# Patient Record
Sex: Female | Born: 1945
Health system: Southern US, Community
[De-identification: ages and names within clinical notes are randomized; demographics above are authoritative.]

## PROBLEM LIST (undated history)

## (undated) DIAGNOSIS — E785 Hyperlipidemia, unspecified: Secondary | ICD-10-CM

## (undated) DIAGNOSIS — Z8679 Personal history of other diseases of the circulatory system: Secondary | ICD-10-CM

## (undated) DIAGNOSIS — I2510 Atherosclerotic heart disease of native coronary artery without angina pectoris: Secondary | ICD-10-CM

## (undated) DIAGNOSIS — D649 Anemia, unspecified: Secondary | ICD-10-CM

## (undated) DIAGNOSIS — T7840XA Allergy, unspecified, initial encounter: Secondary | ICD-10-CM

## (undated) DIAGNOSIS — Z789 Other specified health status: Secondary | ICD-10-CM

## (undated) DIAGNOSIS — I499 Cardiac arrhythmia, unspecified: Secondary | ICD-10-CM

## (undated) DIAGNOSIS — H269 Unspecified cataract: Secondary | ICD-10-CM

## (undated) DIAGNOSIS — Z87891 Personal history of nicotine dependence: Secondary | ICD-10-CM

## (undated) DIAGNOSIS — F329 Major depressive disorder, single episode, unspecified: Secondary | ICD-10-CM

## (undated) DIAGNOSIS — I480 Paroxysmal atrial fibrillation: Secondary | ICD-10-CM

## (undated) DIAGNOSIS — G709 Myoneural disorder, unspecified: Secondary | ICD-10-CM

## (undated) DIAGNOSIS — M47812 Spondylosis without myelopathy or radiculopathy, cervical region: Secondary | ICD-10-CM

## (undated) DIAGNOSIS — D3500 Benign neoplasm of unspecified adrenal gland: Secondary | ICD-10-CM

## (undated) DIAGNOSIS — F419 Anxiety disorder, unspecified: Secondary | ICD-10-CM

## (undated) DIAGNOSIS — F32A Depression, unspecified: Secondary | ICD-10-CM

## (undated) HISTORY — DX: Unspecified cataract: H26.9

## (undated) HISTORY — DX: Anemia, unspecified: D64.9

## (undated) HISTORY — DX: Myoneural disorder, unspecified: G70.9

## (undated) HISTORY — DX: Personal history of nicotine dependence: Z87.891

## (undated) HISTORY — DX: Other specified health status: Z78.9

## (undated) HISTORY — DX: Allergy, unspecified, initial encounter: T78.40XA

## (undated) HISTORY — DX: Anxiety disorder, unspecified: F41.9

## (undated) HISTORY — DX: Hyperlipidemia, unspecified: E78.5

## (undated) HISTORY — DX: Depression, unspecified: F32.A

## (undated) HISTORY — PX: EYE SURGERY: SHX253

## (undated) HISTORY — DX: Personal history of other diseases of the circulatory system: Z86.79

## (undated) HISTORY — DX: Atherosclerotic heart disease of native coronary artery without angina pectoris: I25.10

## (undated) HISTORY — DX: Paroxysmal atrial fibrillation: I48.0

## (undated) HISTORY — DX: Major depressive disorder, single episode, unspecified: F32.9

## (undated) HISTORY — DX: Benign neoplasm of unspecified adrenal gland: D35.00

## (undated) HISTORY — DX: Spondylosis without myelopathy or radiculopathy, cervical region: M47.812

---
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
## 1978-11-12 HISTORY — PX: ABDOMINAL HYSTERECTOMY: SHX81

## 2014-12-14 DIAGNOSIS — I5181 Takotsubo syndrome: Secondary | ICD-10-CM | POA: Diagnosis not present

## 2014-12-14 DIAGNOSIS — E782 Mixed hyperlipidemia: Secondary | ICD-10-CM | POA: Diagnosis not present

## 2014-12-14 DIAGNOSIS — I2510 Atherosclerotic heart disease of native coronary artery without angina pectoris: Secondary | ICD-10-CM | POA: Diagnosis not present

## 2015-01-25 DIAGNOSIS — R002 Palpitations: Secondary | ICD-10-CM | POA: Diagnosis not present

## 2015-01-25 DIAGNOSIS — E785 Hyperlipidemia, unspecified: Secondary | ICD-10-CM | POA: Diagnosis not present

## 2015-04-04 DIAGNOSIS — R1314 Dysphagia, pharyngoesophageal phase: Secondary | ICD-10-CM | POA: Diagnosis not present

## 2015-04-26 DIAGNOSIS — M7521 Bicipital tendinitis, right shoulder: Secondary | ICD-10-CM | POA: Diagnosis not present

## 2015-04-26 DIAGNOSIS — M9901 Segmental and somatic dysfunction of cervical region: Secondary | ICD-10-CM | POA: Diagnosis not present

## 2015-04-26 DIAGNOSIS — M47819 Spondylosis without myelopathy or radiculopathy, site unspecified: Secondary | ICD-10-CM | POA: Diagnosis not present

## 2015-04-28 DIAGNOSIS — Z124 Encounter for screening for malignant neoplasm of cervix: Secondary | ICD-10-CM | POA: Diagnosis not present

## 2015-04-28 DIAGNOSIS — Z9289 Personal history of other medical treatment: Secondary | ICD-10-CM | POA: Diagnosis not present

## 2015-05-10 DIAGNOSIS — M542 Cervicalgia: Secondary | ICD-10-CM | POA: Diagnosis not present

## 2015-05-10 DIAGNOSIS — M545 Low back pain: Secondary | ICD-10-CM | POA: Diagnosis not present

## 2015-05-20 DIAGNOSIS — M542 Cervicalgia: Secondary | ICD-10-CM | POA: Diagnosis not present

## 2015-05-20 DIAGNOSIS — M791 Myalgia: Secondary | ICD-10-CM | POA: Diagnosis not present

## 2015-08-09 DIAGNOSIS — R1314 Dysphagia, pharyngoesophageal phase: Secondary | ICD-10-CM | POA: Diagnosis not present

## 2015-08-09 DIAGNOSIS — R05 Cough: Secondary | ICD-10-CM | POA: Diagnosis not present

## 2015-08-09 DIAGNOSIS — R1310 Dysphagia, unspecified: Secondary | ICD-10-CM | POA: Diagnosis not present

## 2015-08-17 DIAGNOSIS — L309 Dermatitis, unspecified: Secondary | ICD-10-CM | POA: Diagnosis not present

## 2015-09-06 DIAGNOSIS — I2584 Coronary atherosclerosis due to calcified coronary lesion: Secondary | ICD-10-CM | POA: Diagnosis not present

## 2015-09-06 DIAGNOSIS — Z0000 Encounter for general adult medical examination without abnormal findings: Secondary | ICD-10-CM | POA: Diagnosis not present

## 2015-09-06 DIAGNOSIS — E785 Hyperlipidemia, unspecified: Secondary | ICD-10-CM | POA: Diagnosis not present

## 2015-09-06 DIAGNOSIS — Z23 Encounter for immunization: Secondary | ICD-10-CM | POA: Diagnosis not present

## 2015-09-21 DIAGNOSIS — R220 Localized swelling, mass and lump, head: Secondary | ICD-10-CM | POA: Diagnosis not present

## 2015-09-21 DIAGNOSIS — D485 Neoplasm of uncertain behavior of skin: Secondary | ICD-10-CM | POA: Diagnosis not present

## 2015-09-21 DIAGNOSIS — H0289 Other specified disorders of eyelid: Secondary | ICD-10-CM | POA: Diagnosis not present

## 2015-10-25 DIAGNOSIS — Z01812 Encounter for preprocedural laboratory examination: Secondary | ICD-10-CM | POA: Diagnosis not present

## 2015-10-25 DIAGNOSIS — I10 Essential (primary) hypertension: Secondary | ICD-10-CM | POA: Diagnosis not present

## 2015-10-25 DIAGNOSIS — R5383 Other fatigue: Secondary | ICD-10-CM | POA: Diagnosis not present

## 2015-10-25 DIAGNOSIS — E039 Hypothyroidism, unspecified: Secondary | ICD-10-CM | POA: Diagnosis not present

## 2015-10-25 DIAGNOSIS — E785 Hyperlipidemia, unspecified: Secondary | ICD-10-CM | POA: Diagnosis not present

## 2015-10-25 DIAGNOSIS — Z79899 Other long term (current) drug therapy: Secondary | ICD-10-CM | POA: Diagnosis not present

## 2015-10-26 DIAGNOSIS — Z1231 Encounter for screening mammogram for malignant neoplasm of breast: Secondary | ICD-10-CM | POA: Diagnosis not present

## 2015-10-28 DIAGNOSIS — E785 Hyperlipidemia, unspecified: Secondary | ICD-10-CM | POA: Diagnosis not present

## 2015-10-28 DIAGNOSIS — I2510 Atherosclerotic heart disease of native coronary artery without angina pectoris: Secondary | ICD-10-CM | POA: Diagnosis not present

## 2015-10-28 DIAGNOSIS — I5181 Takotsubo syndrome: Secondary | ICD-10-CM | POA: Diagnosis not present

## 2015-10-28 DIAGNOSIS — R002 Palpitations: Secondary | ICD-10-CM | POA: Diagnosis not present

## 2015-10-28 DIAGNOSIS — Z01810 Encounter for preprocedural cardiovascular examination: Secondary | ICD-10-CM | POA: Diagnosis not present

## 2015-11-08 DIAGNOSIS — M791 Myalgia: Secondary | ICD-10-CM | POA: Diagnosis not present

## 2015-11-08 DIAGNOSIS — M542 Cervicalgia: Secondary | ICD-10-CM | POA: Diagnosis not present

## 2015-11-08 DIAGNOSIS — M545 Low back pain: Secondary | ICD-10-CM | POA: Diagnosis not present

## 2015-11-18 DIAGNOSIS — M542 Cervicalgia: Secondary | ICD-10-CM | POA: Diagnosis not present

## 2015-11-18 DIAGNOSIS — M791 Myalgia: Secondary | ICD-10-CM | POA: Diagnosis not present

## 2015-12-12 DIAGNOSIS — R222 Localized swelling, mass and lump, trunk: Secondary | ICD-10-CM | POA: Diagnosis not present

## 2015-12-12 DIAGNOSIS — L905 Scar conditions and fibrosis of skin: Secondary | ICD-10-CM | POA: Diagnosis not present

## 2015-12-12 DIAGNOSIS — F411 Generalized anxiety disorder: Secondary | ICD-10-CM | POA: Diagnosis not present

## 2015-12-12 DIAGNOSIS — I2510 Atherosclerotic heart disease of native coronary artery without angina pectoris: Secondary | ICD-10-CM | POA: Diagnosis not present

## 2015-12-12 DIAGNOSIS — L908 Other atrophic disorders of skin: Secondary | ICD-10-CM | POA: Diagnosis not present

## 2015-12-12 DIAGNOSIS — R220 Localized swelling, mass and lump, head: Secondary | ICD-10-CM | POA: Diagnosis not present

## 2015-12-12 DIAGNOSIS — L720 Epidermal cyst: Secondary | ICD-10-CM | POA: Diagnosis not present

## 2015-12-13 DIAGNOSIS — S01302A Unspecified open wound of left ear, initial encounter: Secondary | ICD-10-CM | POA: Diagnosis not present

## 2015-12-13 DIAGNOSIS — S01301A Unspecified open wound of right ear, initial encounter: Secondary | ICD-10-CM | POA: Diagnosis not present

## 2015-12-16 DIAGNOSIS — S01302A Unspecified open wound of left ear, initial encounter: Secondary | ICD-10-CM | POA: Diagnosis not present

## 2015-12-16 DIAGNOSIS — S01301A Unspecified open wound of right ear, initial encounter: Secondary | ICD-10-CM | POA: Diagnosis not present

## 2015-12-28 DIAGNOSIS — S01301A Unspecified open wound of right ear, initial encounter: Secondary | ICD-10-CM | POA: Diagnosis not present

## 2015-12-28 DIAGNOSIS — S01302A Unspecified open wound of left ear, initial encounter: Secondary | ICD-10-CM | POA: Diagnosis not present

## 2016-01-06 DIAGNOSIS — S01301A Unspecified open wound of right ear, initial encounter: Secondary | ICD-10-CM | POA: Diagnosis not present

## 2016-01-06 DIAGNOSIS — S01302A Unspecified open wound of left ear, initial encounter: Secondary | ICD-10-CM | POA: Diagnosis not present

## 2016-01-18 DIAGNOSIS — I5181 Takotsubo syndrome: Secondary | ICD-10-CM | POA: Diagnosis not present

## 2016-01-18 DIAGNOSIS — E785 Hyperlipidemia, unspecified: Secondary | ICD-10-CM | POA: Diagnosis not present

## 2016-01-18 DIAGNOSIS — R002 Palpitations: Secondary | ICD-10-CM | POA: Diagnosis not present

## 2016-02-15 DIAGNOSIS — S01302A Unspecified open wound of left ear, initial encounter: Secondary | ICD-10-CM | POA: Diagnosis not present

## 2016-02-15 DIAGNOSIS — S01301A Unspecified open wound of right ear, initial encounter: Secondary | ICD-10-CM | POA: Diagnosis not present

## 2016-02-27 DIAGNOSIS — N390 Urinary tract infection, site not specified: Secondary | ICD-10-CM | POA: Diagnosis not present

## 2016-02-27 DIAGNOSIS — R971 Elevated cancer antigen 125 [CA 125]: Secondary | ICD-10-CM | POA: Diagnosis not present

## 2016-02-27 DIAGNOSIS — R1084 Generalized abdominal pain: Secondary | ICD-10-CM | POA: Diagnosis not present

## 2016-03-02 DIAGNOSIS — R102 Pelvic and perineal pain: Secondary | ICD-10-CM | POA: Diagnosis not present

## 2016-03-02 DIAGNOSIS — R140 Abdominal distension (gaseous): Secondary | ICD-10-CM | POA: Diagnosis not present

## 2016-03-08 DIAGNOSIS — R102 Pelvic and perineal pain: Secondary | ICD-10-CM | POA: Diagnosis not present

## 2016-03-12 DIAGNOSIS — R194 Change in bowel habit: Secondary | ICD-10-CM | POA: Diagnosis not present

## 2016-03-12 DIAGNOSIS — Z86010 Personal history of colonic polyps: Secondary | ICD-10-CM | POA: Diagnosis not present

## 2016-03-12 DIAGNOSIS — R1032 Left lower quadrant pain: Secondary | ICD-10-CM | POA: Diagnosis not present

## 2016-03-15 DIAGNOSIS — R109 Unspecified abdominal pain: Secondary | ICD-10-CM | POA: Diagnosis not present

## 2016-03-15 DIAGNOSIS — E278 Other specified disorders of adrenal gland: Secondary | ICD-10-CM | POA: Diagnosis not present

## 2016-03-19 DIAGNOSIS — H35363 Drusen (degenerative) of macula, bilateral: Secondary | ICD-10-CM | POA: Diagnosis not present

## 2016-03-19 DIAGNOSIS — H2513 Age-related nuclear cataract, bilateral: Secondary | ICD-10-CM | POA: Diagnosis not present

## 2016-03-27 DIAGNOSIS — M791 Myalgia: Secondary | ICD-10-CM | POA: Diagnosis not present

## 2016-03-27 DIAGNOSIS — M542 Cervicalgia: Secondary | ICD-10-CM | POA: Diagnosis not present

## 2016-03-27 DIAGNOSIS — R0782 Intercostal pain: Secondary | ICD-10-CM | POA: Diagnosis not present

## 2016-05-17 DIAGNOSIS — I25110 Atherosclerotic heart disease of native coronary artery with unstable angina pectoris: Secondary | ICD-10-CM | POA: Diagnosis not present

## 2016-05-17 DIAGNOSIS — I480 Paroxysmal atrial fibrillation: Secondary | ICD-10-CM | POA: Diagnosis not present

## 2016-05-17 DIAGNOSIS — I959 Hypotension, unspecified: Secondary | ICD-10-CM | POA: Diagnosis not present

## 2016-05-17 DIAGNOSIS — Z8673 Personal history of transient ischemic attack (TIA), and cerebral infarction without residual deficits: Secondary | ICD-10-CM | POA: Diagnosis not present

## 2016-05-17 DIAGNOSIS — I4891 Unspecified atrial fibrillation: Secondary | ICD-10-CM | POA: Diagnosis not present

## 2016-05-17 DIAGNOSIS — R9431 Abnormal electrocardiogram [ECG] [EKG]: Secondary | ICD-10-CM | POA: Diagnosis not present

## 2016-05-17 DIAGNOSIS — Z87891 Personal history of nicotine dependence: Secondary | ICD-10-CM | POA: Diagnosis not present

## 2016-05-17 DIAGNOSIS — F329 Major depressive disorder, single episode, unspecified: Secondary | ICD-10-CM | POA: Diagnosis not present

## 2016-05-17 DIAGNOSIS — E7800 Pure hypercholesterolemia, unspecified: Secondary | ICD-10-CM | POA: Diagnosis not present

## 2016-05-17 DIAGNOSIS — R001 Bradycardia, unspecified: Secondary | ICD-10-CM | POA: Diagnosis not present

## 2016-05-18 DIAGNOSIS — I361 Nonrheumatic tricuspid (valve) insufficiency: Secondary | ICD-10-CM | POA: Diagnosis not present

## 2016-05-18 DIAGNOSIS — I480 Paroxysmal atrial fibrillation: Secondary | ICD-10-CM | POA: Diagnosis not present

## 2016-05-18 DIAGNOSIS — R9431 Abnormal electrocardiogram [ECG] [EKG]: Secondary | ICD-10-CM | POA: Diagnosis not present

## 2016-05-18 DIAGNOSIS — I2510 Atherosclerotic heart disease of native coronary artery without angina pectoris: Secondary | ICD-10-CM | POA: Diagnosis not present

## 2016-05-18 DIAGNOSIS — I340 Nonrheumatic mitral (valve) insufficiency: Secondary | ICD-10-CM | POA: Diagnosis not present

## 2016-05-28 DIAGNOSIS — I482 Chronic atrial fibrillation: Secondary | ICD-10-CM | POA: Diagnosis not present

## 2016-06-11 DIAGNOSIS — I480 Paroxysmal atrial fibrillation: Secondary | ICD-10-CM | POA: Diagnosis not present

## 2016-06-11 DIAGNOSIS — I5181 Takotsubo syndrome: Secondary | ICD-10-CM | POA: Diagnosis not present

## 2016-06-18 DIAGNOSIS — M9902 Segmental and somatic dysfunction of thoracic region: Secondary | ICD-10-CM | POA: Diagnosis not present

## 2016-06-18 DIAGNOSIS — M47814 Spondylosis without myelopathy or radiculopathy, thoracic region: Secondary | ICD-10-CM | POA: Diagnosis not present

## 2016-06-22 DIAGNOSIS — M791 Myalgia: Secondary | ICD-10-CM | POA: Diagnosis not present

## 2016-06-22 DIAGNOSIS — R0782 Intercostal pain: Secondary | ICD-10-CM | POA: Diagnosis not present

## 2016-06-29 DIAGNOSIS — M542 Cervicalgia: Secondary | ICD-10-CM | POA: Diagnosis not present

## 2016-06-29 DIAGNOSIS — M791 Myalgia: Secondary | ICD-10-CM | POA: Diagnosis not present

## 2016-09-06 ENCOUNTER — Encounter: Payer: Self-pay | Admitting: Family Medicine

## 2016-09-06 ENCOUNTER — Encounter (INDEPENDENT_AMBULATORY_CARE_PROVIDER_SITE_OTHER): Payer: Self-pay | Admitting: *Deleted

## 2016-09-06 ENCOUNTER — Ambulatory Visit (INDEPENDENT_AMBULATORY_CARE_PROVIDER_SITE_OTHER): Payer: Medicare Other | Admitting: Family Medicine

## 2016-09-06 VITALS — BP 128/80 | HR 56 | Temp 98.0°F | Resp 16 | Ht 64.0 in | Wt 164.0 lb

## 2016-09-06 DIAGNOSIS — Z7689 Persons encountering health services in other specified circumstances: Secondary | ICD-10-CM | POA: Diagnosis not present

## 2016-09-06 DIAGNOSIS — N390 Urinary tract infection, site not specified: Secondary | ICD-10-CM | POA: Insufficient documentation

## 2016-09-06 DIAGNOSIS — F411 Generalized anxiety disorder: Secondary | ICD-10-CM | POA: Insufficient documentation

## 2016-09-06 DIAGNOSIS — Z86010 Personal history of colonic polyps: Secondary | ICD-10-CM

## 2016-09-06 DIAGNOSIS — I5181 Takotsubo syndrome: Secondary | ICD-10-CM | POA: Insufficient documentation

## 2016-09-06 DIAGNOSIS — E785 Hyperlipidemia, unspecified: Secondary | ICD-10-CM | POA: Insufficient documentation

## 2016-09-06 DIAGNOSIS — M791 Myalgia: Secondary | ICD-10-CM

## 2016-09-06 DIAGNOSIS — Z1211 Encounter for screening for malignant neoplasm of colon: Secondary | ICD-10-CM

## 2016-09-06 DIAGNOSIS — F418 Other specified anxiety disorders: Secondary | ICD-10-CM | POA: Insufficient documentation

## 2016-09-06 DIAGNOSIS — M7918 Myalgia, other site: Secondary | ICD-10-CM | POA: Insufficient documentation

## 2016-09-06 MED ORDER — NITROFURANTOIN MONOHYD MACRO 100 MG PO CAPS: 100.0000 mg | ORAL_CAPSULE | Freq: Two times a day (BID) | ORAL | 3 refills | Status: DC

## 2016-09-06 NOTE — Progress Notes (Signed)
Chief Complaint  Patient presents with  . Establish Care   Pleasant 70 year old woman who is here as a new patient to establish with this primary care. She recently moved to this area from [place]. Old records will be requested but are not yet available for review. She states that her health maintenance is up-to-date, however her colonoscopy was due in July 2017. She feels that she has had a recent mammogram and DEXA scan and immunization update. She states that she eats well and gets regular exercise. She states that she has known hyperlipidemia but is intolerant of all statin medications. She is on a low-cholesterol diet and takes fish oil and coenzyme Q supplements. She was previously sent to the care of a cardiologist. Patient states she's had recurring urinary tract infections. She requests a prescription for Macrodantin. She states she takes 1 pill after intercourse for prevention. She takes Wellbutrin for anxiety and depression. This works well for her. 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 She states she has a Brewing technologist level education. She's has myofascial pain syndrome with recurring pain in her cervical and thoracic region. She states this is been treated successfully with trigger point injections. She requests referral to neurology for an injection.     Patient Active Problem List   Diagnosis Date Noted  . Myofascial pain syndrome 09/06/2016  . Broken heart syndrome 09/06/2016  . HLD (hyperlipidemia) 09/06/2016  . Depression with anxiety 09/06/2016  . Recurrent UTI (urinary tract infection) 09/06/2016    Outpatient Encounter Prescriptions as of 09/06/2016  Medication Sig  . aspirin 81 MG chewable tablet Chew by mouth daily.  . B Complex Vitamins (B-COMPLEX/B-12 SL) Place under the tongue.  Marland Kitchen buPROPion (WELLBUTRIN SR) 150 MG 12 hr tablet Take 150 mg by mouth 2 (two) times daily.  Marland Kitchen co-enzyme Q-10 30 MG capsule Take 30 mg by mouth 3 (three) times daily.  Marland Kitchen docusate  sodium (COLACE) 100 MG capsule Take 100 mg by mouth daily.  Marland Kitchen estradiol (ESTRACE) 1 MG tablet Take 1 mg by mouth daily.  . Multiple Vitamins-Minerals (BIOTECT PLUS PO) Take by mouth.  . omega-3 acid ethyl esters (LOVAZA) 1 g capsule Take 2 g by mouth 2 (two) times daily.  . nitrofurantoin, macrocrystal-monohydrate, (MACROBID) 100 MG capsule Take 1 capsule (100 mg total) by mouth 2 (two) times daily.   No facility-administered encounter medications on file as of 09/06/2016.     Past Medical History:  Diagnosis Date  . Allergy    medicine  . Anemia   . Anxiety   . Cataract   . Depression   . Hyperlipidemia   . Myocardial infarction   . Neuromuscular disorder [facility])     Past Surgical History:  Procedure Laterality Date  . ABDOMINAL HYSTERECTOMY  1980   endometriosis  . TONSILLECTOMY  1954    Social History   Social History  . Marital status: Married    Spouse name: Al  . Number of children: 0  . Years of education: 69   Occupational History  . 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 Smoking status: Former Smoker    Packs/day: 1.00    Types: Cigarettes    Start date: 11/12/1965    Quit date: 09/06/2008  . Smokeless tobacco: Never Used  . Alcohol use 1.2 oz/week    2 Glasses of wine per week  . Drug use: No  . Sexual activity: Yes  Birth control/ protection: Post-menopausal   Other Topics Concern  . 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 COPD Mother   . Hearing loss Mother   . Stroke Mother   . Heart disease Mother     chf, died at 89  . Arthritis Father   . Hypertension Father   . Heart disease Father     cad, pvd, died at 12  . Heart disease Sister     heart valve  . Arthritis Sister   . Hyperlipidemia Brother   . Hypertension Brother     Review of Systems  Constitutional: Negative for  chills, fever and weight loss.  HENT: Negative for congestion and hearing loss.   Eyes: Negative for blurred vision and pain.  Respiratory: Negative for cough and shortness of breath.   Cardiovascular: Negative for chest pain and leg swelling.  Gastrointestinal: Negative for abdominal pain, constipation, diarrhea and heartburn.  Genitourinary: Negative for dysuria and frequency.  Musculoskeletal: Positive for myalgias. Negative for falls and joint pain.       Pain mid back at level of scapula  Neurological: Negative for dizziness, seizures and headaches.  Psychiatric/Behavioral: Negative for depression. The patient is not nervous/anxious and does not have insomnia.        Symptoms controlled on Wellbutrin    BP 128/80 (BP Location: Right Arm, Patient Position: Sitting, Cuff Size: Normal)   Pulse (!) 56   Temp 98 F (36.7 C) (Oral)   Resp 16   Ht 5\' 4"  (1.626 m)   Wt 164 lb 0.6 oz (74.4 kg)   LMP 07/08/1979 (Approximate)   SpO2 100%   BMI 28.16 kg/m   Physical Exam  Constitutional: She is oriented to person, place, and time. She appears well-developed and well-nourished.  Appears younger than stated age  HENT:  Head: Normocephalic and atraumatic.  Right Ear: External ear normal.  Left Ear: External ear normal.  Mouth/Throat: Oropharynx is clear and moist.  Eyes: Conjunctivae are normal. Pupils are equal, round, and reactive to light.  Neck: Normal range of motion. Neck supple. No thyromegaly present.  Cardiovascular: Normal rate, regular rhythm and normal heart sounds.   Pulmonary/Chest: Effort normal and breath sounds normal. No respiratory distress. She exhibits tenderness.  Chest wall mildly tender posteriorly near the right scapula  Abdominal: Soft. Bowel sounds are normal.  Musculoskeletal: Normal range of motion. She exhibits no edema.  Lymphadenopathy:    She has no cervical adenopathy.  Neurological: She is alert and oriented to person, place, and time.  Gait normal   Skin: Skin is warm and dry.  Psychiatric: She has a normal mood and affect. Her behavior is normal. Thought content normal.  Nursing note and vitals reviewed.  ASSESSMENT/PLAN:  1. Myofascial pain syndrome Ice, heat, anti-inflammatory medication - Ambulatory referral to Neurology  2. Broken heart syndrome Recovered. Patient states her ejection fraction is now normal  3. Hyperlipidemia, unspecified hyperlipidemia type Intolerant of statins, recent blood work will be requested  4. Depression with anxiety Controlled on Wellbutrin  5. Screen for colon cancer Colonoscopy order - Ambulatory referral to Gastroenterology  6. History of colonic polyps  - Ambulatory referral to Gastroenterology  7. Encounter to establish care with new doctor Old records are requested  8. Recurrent UTI Refill Macrodantin for when  necessary use   Patient Instructions  Need old medical records  Will refer to Dr Merlene Laughter for neurology issues  Will refer for colonoscopy  Continue to eat well and stay active  See me in a month or two      Raylene Everts, MD

## 2016-09-06 NOTE — Patient Instructions (Addendum)
Need old medical records  Will refer to Dr Merlene Laughter for neurology issues  Will refer for colonoscopy  Continue to eat well and stay active  See me in a month or two

## 2016-09-12 ENCOUNTER — Encounter: Payer: Self-pay | Admitting: Family Medicine

## 2016-09-12 DIAGNOSIS — Z789 Other specified health status: Secondary | ICD-10-CM | POA: Insufficient documentation

## 2016-09-12 DIAGNOSIS — R55 Syncope and collapse: Secondary | ICD-10-CM | POA: Insufficient documentation

## 2016-09-12 DIAGNOSIS — M47812 Spondylosis without myelopathy or radiculopathy, cervical region: Secondary | ICD-10-CM

## 2016-09-12 DIAGNOSIS — K5732 Diverticulitis of large intestine without perforation or abscess without bleeding: Secondary | ICD-10-CM | POA: Insufficient documentation

## 2016-09-12 DIAGNOSIS — I480 Paroxysmal atrial fibrillation: Secondary | ICD-10-CM | POA: Insufficient documentation

## 2016-09-12 DIAGNOSIS — I2510 Atherosclerotic heart disease of native coronary artery without angina pectoris: Secondary | ICD-10-CM | POA: Insufficient documentation

## 2016-09-12 DIAGNOSIS — G459 Transient cerebral ischemic attack, unspecified: Secondary | ICD-10-CM | POA: Insufficient documentation

## 2016-09-12 HISTORY — DX: Spondylosis without myelopathy or radiculopathy, cervical region: M47.812

## 2016-09-19 DIAGNOSIS — R252 Cramp and spasm: Secondary | ICD-10-CM | POA: Diagnosis not present

## 2016-09-19 DIAGNOSIS — F331 Major depressive disorder, recurrent, moderate: Secondary | ICD-10-CM | POA: Diagnosis not present

## 2016-09-19 DIAGNOSIS — I252 Old myocardial infarction: Secondary | ICD-10-CM | POA: Diagnosis not present

## 2016-09-19 DIAGNOSIS — E785 Hyperlipidemia, unspecified: Secondary | ICD-10-CM | POA: Diagnosis not present

## 2016-09-19 DIAGNOSIS — M545 Low back pain: Secondary | ICD-10-CM | POA: Diagnosis not present

## 2016-09-19 DIAGNOSIS — M62830 Muscle spasm of back: Secondary | ICD-10-CM | POA: Diagnosis not present

## 2016-09-28 ENCOUNTER — Encounter: Payer: Self-pay | Admitting: Family Medicine

## 2016-10-02 ENCOUNTER — Encounter: Payer: Self-pay | Admitting: Family Medicine

## 2016-10-02 ENCOUNTER — Ambulatory Visit (INDEPENDENT_AMBULATORY_CARE_PROVIDER_SITE_OTHER): Payer: Medicare Other | Admitting: Family Medicine

## 2016-10-02 DIAGNOSIS — M791 Myalgia: Secondary | ICD-10-CM | POA: Diagnosis not present

## 2016-10-02 DIAGNOSIS — I2510 Atherosclerotic heart disease of native coronary artery without angina pectoris: Secondary | ICD-10-CM

## 2016-10-02 DIAGNOSIS — Z1231 Encounter for screening mammogram for malignant neoplasm of breast: Secondary | ICD-10-CM

## 2016-10-02 DIAGNOSIS — M7918 Myalgia, other site: Secondary | ICD-10-CM

## 2016-10-02 DIAGNOSIS — Z1239 Encounter for other screening for malignant neoplasm of breast: Secondary | ICD-10-CM

## 2016-10-02 DIAGNOSIS — L989 Disorder of the skin and subcutaneous tissue, unspecified: Secondary | ICD-10-CM

## 2016-10-02 MED ORDER — ESTRADIOL 0.5 MG PO TABS: 0.5000 mg | ORAL_TABLET | Freq: Every day | ORAL | 0 refills | Status: DC

## 2016-10-02 NOTE — Progress Notes (Signed)
Chief Complaint  Patient presents with  . Follow-up    neurology  went to neurology Didn't start the prescribed Neurontin or hydrocodone Wants referral to pain management and PT  Also requests referral to cardiology to establish  Wants referral to dermatology for skin lesion  Wants refill of estrace.  I let her know I do not recommend  this medicine.  I will give her a 3 mo supply of a reduced dose in attempt to taper and discontinue.    Patient Active Problem List   Diagnosis Date Noted  . CAD (coronary artery disease) 09/12/2016  . PAF (paroxysmal atrial fibrillation) ([place]) 09/12/2016  . Syncope 09/12/2016  . DJD (degenerative joint disease) of cervical spine 09/12/2016  . Statin intolerance 09/12/2016  . TIA (transient ischemic attack) 09/12/2016  . Diverticulitis of colon 09/12/2016  . Myofascial pain syndrome 09/06/2016  . Broken heart syndrome 09/06/2016  . HLD (hyperlipidemia) 09/06/2016  . Depression with anxiety 09/06/2016  . Recurrent UTI (urinary tract infection) 09/06/2016    Outpatient Encounter Prescriptions as of 10/02/2016  Medication Sig  . aspirin 81 MG chewable tablet Chew by mouth daily.  . B Complex Vitamins (B-COMPLEX/B-12 SL) Place under the tongue.  Marland Kitchen buPROPion (WELLBUTRIN SR) 150 MG 12 hr tablet Take 150 mg by mouth 2 (two) times daily.  Marland Kitchen co-enzyme Q-10 30 MG capsule Take 30 mg by mouth 3 (three) times daily.  Marland Kitchen docusate sodium (COLACE) 100 MG capsule Take 100 mg by mouth daily.  Marland Kitchen estradiol (ESTRACE) 0.5 MG tablet Take 1 tablet (0.5 mg total) by mouth daily.  . Multiple Vitamins-Minerals (BIOTECT PLUS PO) Take by mouth.  . nitrofurantoin, macrocrystal-monohydrate, (MACROBID) 100 MG capsule Take 1 capsule (100 mg total) by mouth 2 (two) times daily.  Marland Kitchen omega-3 acid ethyl esters (LOVAZA) 1 g capsule Take 2 g by mouth 2 (two) times daily.  . [DISCONTINUED] estradiol (ESTRACE) 1 MG tablet Take 1 mg by mouth daily.   No facility-administered  encounter medications on file as of 10/02/2016.     Allergies  Allergen Reactions  . Statins Other (See Comments)    Intolerant all statins  . Sulfa Antibiotics Rash    Review of Systems  Constitutional: Negative for activity change, appetite change and fatigue.  HENT: Negative.  Negative for congestion and dental problem.   Eyes: Negative.  Negative for visual disturbance.  Respiratory: Negative.  Negative for cough and shortness of breath.   Cardiovascular: Negative.  Negative for chest pain, palpitations and leg swelling.  Gastrointestinal: Negative for constipation and diarrhea.  Genitourinary: Negative for urgency and vaginal bleeding.  Musculoskeletal: Positive for myalgias.  Psychiatric/Behavioral: Negative for dysphoric mood and sleep disturbance.    LMP 07/08/1979 (Approximate)   Physical Exam  Constitutional: She is oriented to person, place, and time. She appears well-developed and well-nourished. No distress.  Refuses vital signs, weight or exam.  Wants "discussion"  Neurological: She is alert and oriented to person, place, and time.  Psychiatric: She has a normal mood and affect. Her behavior is normal. Thought content normal.    ASSESSMENT/PLAN:  1. Myofascial pain syndrome - Ambulatory referral to Pain Clinic - Ambulatory referral to Physical Therapy 2. Coronary artery disease involving native heart without angina pectoris, unspecified vessel or lesion type - Ambulatory referral to Cardiology 3. Skin lesion of chest wall - Ambulatory referral to Dermatology 4. Screening for breast cancer - MM Digital Screening; Future   Patient Instructions  Try to cut the estrace in  half  Referrals are placed  See me in 3 months  Call sooner for problems     Raylene Everts, MD

## 2016-10-02 NOTE — Patient Instructions (Addendum)
Try to cut the estrace in half  Referrals are placed  See me in 3 months  Call sooner for problems

## 2016-10-10 DIAGNOSIS — I781 Nevus, non-neoplastic: Secondary | ICD-10-CM | POA: Diagnosis not present

## 2016-10-10 DIAGNOSIS — L720 Epidermal cyst: Secondary | ICD-10-CM | POA: Diagnosis not present

## 2016-10-10 DIAGNOSIS — L820 Inflamed seborrheic keratosis: Secondary | ICD-10-CM | POA: Diagnosis not present

## 2016-10-12 ENCOUNTER — Telehealth (HOSPITAL_COMMUNITY): Payer: Self-pay | Admitting: Specialist

## 2016-10-12 NOTE — Telephone Encounter (Signed)
Pt called states she is much better and going to the gym she cx this apptment and doesn't need OT at this time

## 2016-10-16 ENCOUNTER — Ambulatory Visit (HOSPITAL_COMMUNITY): Payer: Medicare Other

## 2016-10-30 ENCOUNTER — Ambulatory Visit: Payer: Medicare Other | Admitting: Family Medicine

## 2016-11-02 ENCOUNTER — Telehealth: Payer: Self-pay | Admitting: Cardiology

## 2016-11-02 NOTE — Telephone Encounter (Signed)
Since I have not yet evaluated her or reviewed her history, she still needs to keep in contact with her PCP for recommendations. If symptoms are infrequent, she might be able to use an agent such as Lopressor or short acting Cardizem for palpitations, but again I would have her discuss this and have it ordered by her PCP since I have not seen her yet. If symptoms escalate, being evaluated in the ER would also be appropriate.

## 2016-11-02 NOTE — Telephone Encounter (Signed)
I spoke with patient and gave her Dr Myles Gip advice,she agreed.

## 2016-11-02 NOTE — Telephone Encounter (Signed)
Pt lvm stating she had an unsteady rhythm last night that went all the way up into the 130's --please give her a call @ [phone]

## 2016-11-02 NOTE — Telephone Encounter (Signed)
Had one episode of Afib with RVR this summer in [place],was cardioverted x 2 but was not successful,converted on own later.Declined any rate control meds.Yesterday had 12 hr episode of HR up to 130.Resolved and feels back to normal now. She has a new patient apt with you in [place] question is what should she do if it occurs again before she sees you. I told her she may need to go to the ED

## 2016-11-08 ENCOUNTER — Encounter: Payer: Self-pay | Admitting: Family Medicine

## 2016-11-08 ENCOUNTER — Ambulatory Visit (INDEPENDENT_AMBULATORY_CARE_PROVIDER_SITE_OTHER): Payer: Medicare Other | Admitting: Family Medicine

## 2016-11-08 VITALS — BP 118/70 | HR 60 | Temp 98.0°F | Resp 16 | Ht 64.0 in

## 2016-11-08 DIAGNOSIS — I480 Paroxysmal atrial fibrillation: Secondary | ICD-10-CM

## 2016-11-08 DIAGNOSIS — M79672 Pain in left foot: Secondary | ICD-10-CM

## 2016-11-08 DIAGNOSIS — I2510 Atherosclerotic heart disease of native coronary artery without angina pectoris: Secondary | ICD-10-CM | POA: Diagnosis not present

## 2016-11-08 MED ORDER — ESTRADIOL 0.5 MG PO TABS: 0.5000 mg | ORAL_TABLET | Freq: Every day | ORAL | 0 refills | Status: DC

## 2016-11-08 NOTE — Patient Instructions (Addendum)
I will refer to podiatry for the foot pain  See cardiology as scheduled  Call for questions or problems  Check on mammogram order

## 2016-11-08 NOTE — Progress Notes (Signed)
Chief Complaint  Patient presents with  . Follow-up    3 month   Routine follow up Is having recurrent runs of A Fib and we discussed that I recomend she see cardiology, and follow their recommendations to go on an anticoagulant.I had already placed to cardiology referral and she is going next week. She already has known coronary artery disease, she had a spell of takotsubo cardiomyopathy, and recovered. Now with recurring atrial fibrillation. She has no significant hyperlipidemia and is unwilling or unable to take a statin medication. I have concern regarding her risk of cardiovascular disease as well as her risk of stroke given recurring atrial fibrillation. In addition we discussed that she has left foot pain. It hurts to the bottom of her foot underneath the outside anklebone. She looked it up on the Internet and thinks she has tarsal tunnel syndrome. She requests referral to podiatry. She states that the myofascial pain in her thoracic region is improved.  Patient Active Problem List   Diagnosis Date Noted  . CAD (coronary artery disease) 09/12/2016  . PAF (paroxysmal atrial fibrillation) ([place]) 09/12/2016  . Syncope 09/12/2016  . DJD (degenerative joint disease) of cervical spine 09/12/2016  . Statin intolerance 09/12/2016  . TIA (transient ischemic attack) 09/12/2016  . Diverticulitis of colon 09/12/2016  . Myofascial pain syndrome 09/06/2016  . Takotsubo syndrome 09/06/2016  . HLD (hyperlipidemia) 09/06/2016  . Depression with anxiety 09/06/2016  . Recurrent UTI (urinary tract infection) 09/06/2016    Outpatient Encounter Prescriptions as of 11/08/2016  Medication Sig  . aspirin 81 MG chewable tablet Chew by mouth daily.  . B Complex Vitamins (B-COMPLEX/B-12 SL) Place under the tongue.  . Biotin 10000 MCG TABS Take by mouth.  Marland Kitchen buPROPion (WELLBUTRIN SR) 150 MG 12 hr tablet Take 150 mg by mouth 2 (two) times daily.  Marland Kitchen co-enzyme Q-10 30 MG capsule Take 30 mg by mouth 3  (three) times daily.  Marland Kitchen docusate sodium (COLACE) 100 MG capsule Take 100 mg by mouth daily.  Marland Kitchen estradiol (ESTRACE) 0.5 MG tablet Take 1 tablet (0.5 mg total) by mouth daily.  Marland Kitchen omega-3 acid ethyl esters (LOVAZA) 1 g capsule Take 2 g by mouth 2 (two) times daily.  . Multiple Vitamins-Minerals (BIOTECT PLUS PO) Take by mouth.  . nitrofurantoin, macrocrystal-monohydrate, (MACROBID) 100 MG capsule Take 1 capsule (100 mg total) by mouth 2 (two) times daily. (Patient not taking: Reported on 11/08/2016)   No facility-administered encounter medications on file as of 11/08/2016.     Allergies  Allergen Reactions  . Statins Other (See Comments)    Intolerant all statins  . Sulfa Antibiotics Rash    Review of Systems  Constitutional: Negative for activity change, appetite change and fatigue.  HENT: Negative.  Negative for congestion and dental problem.   Eyes: Negative.  Negative for visual disturbance.  Respiratory: Negative.  Negative for cough and shortness of breath.   Cardiovascular: Positive for palpitations. Negative for chest pain and leg swelling.  Gastrointestinal: Negative for constipation and diarrhea.  Genitourinary: Negative for urgency and vaginal bleeding.  Musculoskeletal: Positive for gait problem and myalgias.  Psychiatric/Behavioral: Negative for dysphoric mood and sleep disturbance.   BP 118/70 (BP Location: Right Arm, Patient Position: Sitting, Cuff Size: Normal)   Pulse 60   Temp 98 F (36.7 C) (Oral)   Resp 16   Ht 5\' 4"  (1.626 m)   SpO2 97%   Physical Exam  Constitutional: She is oriented to person, place, and time.  She appears well-developed and well-nourished. No distress.  Refuses vital signs, weight or exam.  Wants "discussion"  Cardiovascular: Normal rate, regular rhythm and normal heart sounds.   Pulmonary/Chest: Effort normal and breath sounds normal.  Neurological: She is alert and oriented to person, place, and time.  Psychiatric: She has a normal mood  and affect. Her behavior is normal. Thought content normal.    ASSESSMENT/PLAN:  1. Foot pain, left  - Ambulatory referral to Podiatry  2. PAF (paroxysmal atrial fibrillation) [facility])  Referral to cardiology  Patient Instructions  I will refer to podiatry for the foot pain  See cardiology as scheduled  Call for questions or problems  Check on mammogram order   Raylene Everts, MD

## 2016-11-16 ENCOUNTER — Encounter: Payer: Self-pay | Admitting: Cardiology

## 2016-11-16 ENCOUNTER — Ambulatory Visit: Payer: Medicare Other | Admitting: Cardiology

## 2016-11-16 ENCOUNTER — Ambulatory Visit (INDEPENDENT_AMBULATORY_CARE_PROVIDER_SITE_OTHER): Payer: Medicare Other | Admitting: Cardiology

## 2016-11-16 VITALS — BP 104/60 | HR 63 | Ht 64.0 in | Wt 164.0 lb

## 2016-11-16 DIAGNOSIS — Z789 Other specified health status: Secondary | ICD-10-CM | POA: Diagnosis not present

## 2016-11-16 DIAGNOSIS — Z8679 Personal history of other diseases of the circulatory system: Secondary | ICD-10-CM | POA: Diagnosis not present

## 2016-11-16 DIAGNOSIS — I25810 Atherosclerosis of coronary artery bypass graft(s) without angina pectoris: Secondary | ICD-10-CM

## 2016-11-16 DIAGNOSIS — E782 Mixed hyperlipidemia: Secondary | ICD-10-CM

## 2016-11-16 DIAGNOSIS — I480 Paroxysmal atrial fibrillation: Secondary | ICD-10-CM

## 2016-11-16 NOTE — Patient Instructions (Signed)
Your physician recommends that you schedule a follow-up appointment in: 3 months Dr Domenic Polite    Your physician has requested that you have an echocardiogram. Echocardiography is a painless test that uses sound waves to create images of your heart. It provides your doctor with information about the size and shape of your heart and how well your heart's chambers and valves are working. This procedure takes approximately one hour. There are no restrictions for this procedure.    Please make an apt with [facility], Edrick Oh RN    Continue aspirin until then     Thank you for choosing [facility] !

## 2016-11-16 NOTE — Progress Notes (Signed)
Cardiology Office Note  Date: 11/16/2016   ID: Heather [name], DOB 29-Mar-1946, MRN [id number]  PCP: Raylene Everts, MD  Consulting Cardiologist: Rozann Lesches, MD   Chief Complaint  Patient presents with  . Coronary Artery Disease  . PAF    History of Present Illness: Heather [name] is a 71 y.o. female referred for cardiology consultation by Dr. Meda Coffee. I reviewed the available outside records regarding her previous cardiac workup and updated the chart. History includes nonobstructive CAD involving the mid LAD documented by cardiac catheterization in 2014. She reportedly had an episode of Tako-tsubo cardiomyopathy in October 2015, and had subsequent normalization of LVEF. She recalls having a cardiac catheterization in 2015 that showed stable coronary anatomy. She also has a history of paroxysmal atrial fibrillation documented in July 2017. At that point she declined anticoagulation and apparently underwent an unsuccessful attempt at cardioversion, although with subsequent spontaneous conversion to sinus rhythm after 12 hours.  She moved to this area with her husband. States that she has had at least 2 episodes of limited atrial fibrillation since then, based on finding her heart rate in the 130s to 140s on her Fitbit and feeling palpitations. She has not had any angina symptoms. She joined the [employer] to exercise.  CHADSVASC score is 3 based on available information. Annual risk of stroke is approximately 3.4% on aspirin versus 1.1% on agent such as Eliquis in approximately 1.5% on Coumadin. She states that she does not have prescription drug coverage and would prefer to take Coumadin, her husband is also on Coumadin.  I reviewed her ECG today which shows sinus bradycardia with anteroseptal Q waves, nonspecific T-wave changes, QT 436 ms.  Today we had a detailed discussion about paroxysmal atrial fibrillation, use of anticoagulation for stroke prophylaxis, also various methods for  maintaining sinus rhythm including antiarrhythmics and potentially even ablation. For the time being we have elected to adopt a strategy of observation on anticoagulation to see how frequently she experiences breakthrough events. He has a history of bradycardia and possibly conduction system disease, was not able to tolerate beta blocker in the past due to marked bradycardia. This may limit treatment options, she may even require a pacemaker at some point in the future. She voiced comfort with this plan.  Past Medical History:  Diagnosis Date  . Adrenal adenoma   . Allergy   . Anemia   . Anxiety   . CAD (coronary artery disease)    50-60% mid LAD at cardiac catheterization 2014 [facility])  . Cataract   . Depression   . DJD (degenerative joint disease) of cervical spine 09/12/2016  . History of cardiomyopathy   . History of tobacco use   . Hyperlipidemia   . Neuromuscular disorder ([place])   . PAF (paroxysmal atrial fibrillation) [facility])    Diagnosis July 2017 - spontaneously resolved  . Statin intolerance     Past Surgical History:  Procedure Laterality Date  . ABDOMINAL HYSTERECTOMY  1980   endometriosis  . TONSILLECTOMY  1954    Current Outpatient Prescriptions  Medication Sig Dispense Refill  . aspirin 81 MG chewable tablet Chew by mouth daily.    . B Complex Vitamins (B-COMPLEX/B-12 SL) Place under the tongue.    . Biotin 10000 MCG TABS Take by mouth.    Marland Kitchen buPROPion (WELLBUTRIN SR) 150 MG 12 hr tablet Take 150 mg by mouth 2 (two) times daily.    Marland Kitchen co-enzyme Q-10 30 MG capsule Take 30 mg by mouth 3 (  three) times daily.    Marland Kitchen docusate sodium (COLACE) 100 MG capsule Take 100 mg by mouth daily.    Marland Kitchen estradiol (ESTRACE) 0.5 MG tablet Take 1 tablet (0.5 mg total) by mouth daily. 90 tablet 0  . omega-3 acid ethyl esters (LOVAZA) 1 g capsule Take 2 g by mouth 2 (two) times daily.     No current facility-administered medications for this visit.    Allergies:  Statins and Sulfa  antibiotics   Social History: The patient  reports that she quit smoking about 8 years ago. Her smoking use included Cigarettes. She started smoking about 51 years ago. She smoked 1.00 pack per day. She has never used smokeless tobacco. She reports that she drinks about 1.2 oz of alcohol per week . She reports that she does not use drugs.   Family History: The patient's family history includes Arthritis in her father and sister; COPD in her mother; Hearing loss in her mother; Heart disease in her father, mother, and sister; Hyperlipidemia in her brother; Hypertension in her brother and father; Stroke in her mother.   ROS:  Please see the history of present illness. Otherwise, complete review of systems is positive for none.  All other systems are reviewed and negative.   Physical Exam: VS:  BP 104/60 (BP Location: Right Arm)   Pulse 63   Ht 5\' 4"  (1.626 m)   Wt 164 lb (74.4 kg)   LMP 07/08/1979 (Approximate)   SpO2 99%   BMI 28.15 kg/m , BMI Body mass index is 28.15 kg/m.  Wt Readings from Last 3 Encounters:  11/16/16 164 lb (74.4 kg)  09/06/16 164 lb 0.6 oz (74.4 kg)    General: Patient appears comfortable at rest. Appears younger than stated age. HEENT: Conjunctiva and lids normal, oropharynx clear. Neck: Supple, no elevated JVP or carotid bruits, no thyromegaly. Lungs: Clear to auscultation, nonlabored breathing at rest. Cardiac: Regular rate and rhythm, no S3 or significant systolic murmur, no pericardial rub. Abdomen: Soft, nontender, bowel sounds present, no guarding or rebound. Extremities: No pitting edema, distal pulses 2+. Skin: Warm and dry. Musculoskeletal: No kyphosis. Neuropsychiatric: Alert and oriented x3, affect grossly appropriate.  ECG: I personally reviewed the tracing from 09/10/2016 which showed sinus bradycardia with septal Q waves.  Recent Labwork:  October 2017: BUN 17, creatinine 0.8, cholesterol 385, triglycerides 78, HDL 81, LDL 288, AST 14, ALT  15  Other Studies Reviewed Today:  Cardiac catheterization 03/06/2013 [facility]): Left main with mild luminal irregularities, 50-60% mid LAD stenosis spanning the origin of moderate caliber second diagonal branch which is larger in caliber than the distal LAD, left circumflex with mild luminal irregularities, RCA with mild luminal irregularities.  Echocardiogram 12/14/2014 [facility]): Normal LV wall thickness without regional wall motion abnormalities, LVEF 0000000, mild diastolic dysfunction, trace mitral regurgitation, normal left atrial chamber size, normal aortic valve, normal right ventricular contraction, trace tricuspid regurgitation with PA S/P 15-20 mmHg, normal aortic root size, no pericardial effusion.  Assessment and Plan:  1. Paroxysmal atrial fibrillation with CHADSVASC score 3. She has had brief limited events since moving to this area from [place] based on palpitations and elevated heart rate on her Fitbit. As outlined above we have discussed anticoagulation for stroke prophylaxis, and she would like to initiate Coumadin, does not have prescription drug coverage for DOAC. She will be enrolled in our anticoagulation clinic. We will continue with observation to see how frequently she has breakthrough events. Will consider referral to  EP for discussion of antiarrhythmic therapy and other options for treatment depending on symptom frequency. Echocardiogram will also be obtained to follow-up on cardiac structure and function.  2. Moderate nonobstructive CAD involving the LAD based on previous cardiac catheterization in 2014 and reportedly 2015 (I do not have this report). She is not reporting any angina symptoms at this time. For now she takes an aspirin daily. This will be stopped when she initiates Coumadin.  3. History of hyperlipidemia and statin intolerance. May be a good candidate for referral to the lipid clinic and discussion of PCSK9  inhibitor.  4. Reported history of Tako-tsubo cardiomyopathy in 2015 with subsequent normalization of LVEF.   Current medicines were reviewed with the patient today.   Orders Placed This Encounter  Procedures  . EKG 12-Lead  . ECHOCARDIOGRAM COMPLETE    Disposition: Follow-up in 3 months.  Signed, Satira Sark, MD, [facility] 11/16/2016 2:10 PM    [place] [facility] [address] [place], [place] [address] Phone: [phone]; Fax: [phone]

## 2016-11-19 ENCOUNTER — Ambulatory Visit: Payer: Medicare Other | Admitting: Cardiovascular Disease

## 2016-11-21 ENCOUNTER — Ambulatory Visit (INDEPENDENT_AMBULATORY_CARE_PROVIDER_SITE_OTHER): Payer: Medicare Other | Admitting: *Deleted

## 2016-11-21 ENCOUNTER — Ambulatory Visit (HOSPITAL_COMMUNITY)
Admission: RE | Admit: 2016-11-21 | Discharge: 2016-11-21 | Disposition: A | Discharge status: Home / Self Care | Payer: Medicare Other | Source: Ambulatory Visit | Attending: Cardiology | Admitting: Cardiology

## 2016-11-21 DIAGNOSIS — E785 Hyperlipidemia, unspecified: Secondary | ICD-10-CM | POA: Insufficient documentation

## 2016-11-21 DIAGNOSIS — I480 Paroxysmal atrial fibrillation: Secondary | ICD-10-CM

## 2016-11-21 DIAGNOSIS — Z8673 Personal history of transient ischemic attack (TIA), and cerebral infarction without residual deficits: Secondary | ICD-10-CM | POA: Insufficient documentation

## 2016-11-21 DIAGNOSIS — I2510 Atherosclerotic heart disease of native coronary artery without angina pectoris: Secondary | ICD-10-CM | POA: Insufficient documentation

## 2016-11-21 DIAGNOSIS — I361 Nonrheumatic tricuspid (valve) insufficiency: Secondary | ICD-10-CM | POA: Insufficient documentation

## 2016-11-21 DIAGNOSIS — I25810 Atherosclerosis of coronary artery bypass graft(s) without angina pectoris: Secondary | ICD-10-CM

## 2016-11-21 DIAGNOSIS — Z5181 Encounter for therapeutic drug level monitoring: Secondary | ICD-10-CM | POA: Diagnosis not present

## 2016-11-21 DIAGNOSIS — I059 Rheumatic mitral valve disease, unspecified: Secondary | ICD-10-CM | POA: Insufficient documentation

## 2016-11-21 DIAGNOSIS — Z87891 Personal history of nicotine dependence: Secondary | ICD-10-CM | POA: Insufficient documentation

## 2016-11-21 LAB — POCT INR: INR: 0.9

## 2016-11-21 MED ORDER — WARFARIN SODIUM 5 MG PO TABS: 5.0000 mg | ORAL_TABLET | Freq: Every day | ORAL | 3 refills | Status: DC

## 2016-11-21 NOTE — Progress Notes (Signed)
*  PRELIMINARY RESULTS* Echocardiogram 2D Echocardiogram has been performed.  Leavy Cella 11/21/2016, 3:44 PM

## 2016-11-22 DIAGNOSIS — Z23 Encounter for immunization: Secondary | ICD-10-CM | POA: Diagnosis not present

## 2016-11-28 ENCOUNTER — Ambulatory Visit: Payer: PRIVATE HEALTH INSURANCE | Admitting: Podiatry

## 2016-12-03 ENCOUNTER — Ambulatory Visit (INDEPENDENT_AMBULATORY_CARE_PROVIDER_SITE_OTHER): Payer: Medicare Other | Admitting: *Deleted

## 2016-12-03 ENCOUNTER — Other Ambulatory Visit (INDEPENDENT_AMBULATORY_CARE_PROVIDER_SITE_OTHER): Payer: Self-pay | Admitting: *Deleted

## 2016-12-03 DIAGNOSIS — Z86010 Personal history of colonic polyps: Secondary | ICD-10-CM

## 2016-12-03 DIAGNOSIS — Z5181 Encounter for therapeutic drug level monitoring: Secondary | ICD-10-CM | POA: Diagnosis not present

## 2016-12-03 DIAGNOSIS — Z800 Family history of malignant neoplasm of digestive organs: Secondary | ICD-10-CM

## 2016-12-03 DIAGNOSIS — I25810 Atherosclerosis of coronary artery bypass graft(s) without angina pectoris: Secondary | ICD-10-CM | POA: Diagnosis not present

## 2016-12-03 DIAGNOSIS — I480 Paroxysmal atrial fibrillation: Secondary | ICD-10-CM

## 2016-12-03 LAB — POCT INR: INR: 1.3

## 2016-12-06 DIAGNOSIS — M2040 Other hammer toe(s) (acquired), unspecified foot: Secondary | ICD-10-CM | POA: Diagnosis not present

## 2016-12-06 DIAGNOSIS — M79673 Pain in unspecified foot: Secondary | ICD-10-CM | POA: Diagnosis not present

## 2016-12-06 DIAGNOSIS — G5750 Tarsal tunnel syndrome, unspecified lower limb: Secondary | ICD-10-CM | POA: Diagnosis not present

## 2016-12-10 ENCOUNTER — Ambulatory Visit (INDEPENDENT_AMBULATORY_CARE_PROVIDER_SITE_OTHER): Payer: Medicare Other | Admitting: *Deleted

## 2016-12-10 DIAGNOSIS — Z5181 Encounter for therapeutic drug level monitoring: Secondary | ICD-10-CM

## 2016-12-10 DIAGNOSIS — I480 Paroxysmal atrial fibrillation: Secondary | ICD-10-CM | POA: Diagnosis not present

## 2016-12-10 DIAGNOSIS — I25810 Atherosclerosis of coronary artery bypass graft(s) without angina pectoris: Secondary | ICD-10-CM | POA: Diagnosis not present

## 2016-12-10 LAB — POCT INR: INR: 1.5

## 2016-12-10 MED ORDER — WARFARIN SODIUM 7.5 MG PO TABS: 7.5000 mg | ORAL_TABLET | Freq: Every day | ORAL | 4 refills | Status: DC

## 2016-12-14 ENCOUNTER — Other Ambulatory Visit: Payer: Self-pay

## 2016-12-14 DIAGNOSIS — G454 Transient global amnesia: Secondary | ICD-10-CM

## 2016-12-14 NOTE — Progress Notes (Unsigned)
You have been referred to neurology. Someone from their office will contact you soon  Thanks for choosing [facility]!!!  ]

## 2016-12-17 ENCOUNTER — Ambulatory Visit (INDEPENDENT_AMBULATORY_CARE_PROVIDER_SITE_OTHER): Payer: Medicare Other | Admitting: *Deleted

## 2016-12-17 DIAGNOSIS — Z5181 Encounter for therapeutic drug level monitoring: Secondary | ICD-10-CM

## 2016-12-17 DIAGNOSIS — I25810 Atherosclerosis of coronary artery bypass graft(s) without angina pectoris: Secondary | ICD-10-CM

## 2016-12-17 DIAGNOSIS — I480 Paroxysmal atrial fibrillation: Secondary | ICD-10-CM

## 2016-12-17 LAB — POCT INR: INR: 1.3

## 2016-12-17 MED ORDER — WARFARIN SODIUM 5 MG PO TABS: ORAL_TABLET | ORAL | 3 refills | Status: DC

## 2016-12-21 ENCOUNTER — Encounter (INDEPENDENT_AMBULATORY_CARE_PROVIDER_SITE_OTHER): Payer: Self-pay

## 2016-12-24 ENCOUNTER — Ambulatory Visit (INDEPENDENT_AMBULATORY_CARE_PROVIDER_SITE_OTHER): Payer: Medicare Other | Admitting: *Deleted

## 2016-12-24 DIAGNOSIS — I480 Paroxysmal atrial fibrillation: Secondary | ICD-10-CM | POA: Diagnosis not present

## 2016-12-24 DIAGNOSIS — I25810 Atherosclerosis of coronary artery bypass graft(s) without angina pectoris: Secondary | ICD-10-CM | POA: Diagnosis not present

## 2016-12-24 DIAGNOSIS — Z5181 Encounter for therapeutic drug level monitoring: Secondary | ICD-10-CM

## 2016-12-24 LAB — POCT INR: INR: 3.1

## 2016-12-31 ENCOUNTER — Ambulatory Visit (INDEPENDENT_AMBULATORY_CARE_PROVIDER_SITE_OTHER): Payer: Medicare Other | Admitting: *Deleted

## 2016-12-31 DIAGNOSIS — I480 Paroxysmal atrial fibrillation: Secondary | ICD-10-CM | POA: Diagnosis not present

## 2016-12-31 DIAGNOSIS — Z5181 Encounter for therapeutic drug level monitoring: Secondary | ICD-10-CM

## 2016-12-31 DIAGNOSIS — I25810 Atherosclerosis of coronary artery bypass graft(s) without angina pectoris: Secondary | ICD-10-CM

## 2016-12-31 LAB — POCT INR: INR: 1.9

## 2017-01-02 ENCOUNTER — Ambulatory Visit: Payer: Medicare Other | Admitting: Family Medicine

## 2017-01-11 ENCOUNTER — Telehealth (INDEPENDENT_AMBULATORY_CARE_PROVIDER_SITE_OTHER): Payer: Self-pay | Admitting: *Deleted

## 2017-01-11 ENCOUNTER — Encounter (INDEPENDENT_AMBULATORY_CARE_PROVIDER_SITE_OTHER): Payer: Self-pay | Admitting: *Deleted

## 2017-01-11 NOTE — Telephone Encounter (Signed)
Patient needs trilyte 

## 2017-01-11 NOTE — Telephone Encounter (Signed)
Patient is scheduled for colonoscopy 03/06/17 and needs to stop Warfarin 5 days & ASA 2 days prior -- please advise if ok

## 2017-01-14 ENCOUNTER — Ambulatory Visit (INDEPENDENT_AMBULATORY_CARE_PROVIDER_SITE_OTHER): Payer: Medicare Other | Admitting: *Deleted

## 2017-01-14 DIAGNOSIS — I25810 Atherosclerosis of coronary artery bypass graft(s) without angina pectoris: Secondary | ICD-10-CM | POA: Diagnosis not present

## 2017-01-14 DIAGNOSIS — Z5181 Encounter for therapeutic drug level monitoring: Secondary | ICD-10-CM

## 2017-01-14 DIAGNOSIS — I480 Paroxysmal atrial fibrillation: Secondary | ICD-10-CM

## 2017-01-14 LAB — POCT INR: INR: 3

## 2017-01-14 MED ORDER — PEG 3350-KCL-NA BICARB-NACL 420 G PO SOLR: 4000.0000 mL | Freq: Once | ORAL | 0 refills | Status: AC

## 2017-01-15 NOTE — Telephone Encounter (Signed)
Do you think think pt needs Lovenox bridge.  Dr Meda Coffee mentions TIA but I don't see any documentation of that.  Please advise. Thanks.

## 2017-01-16 NOTE — Telephone Encounter (Signed)
Does not look like there is a strong indication for Lovenox bridge.

## 2017-01-16 NOTE — Telephone Encounter (Signed)
Ok to hold coumadin 5 days before procedure and ASA 2 days before procedure.  Does not need Lovenox.

## 2017-01-16 NOTE — Telephone Encounter (Signed)
Patient aware.

## 2017-01-24 ENCOUNTER — Ambulatory Visit: Payer: Medicare Other | Admitting: Neurology

## 2017-01-28 ENCOUNTER — Ambulatory Visit (INDEPENDENT_AMBULATORY_CARE_PROVIDER_SITE_OTHER): Payer: Medicare Other | Admitting: *Deleted

## 2017-01-28 DIAGNOSIS — I480 Paroxysmal atrial fibrillation: Secondary | ICD-10-CM

## 2017-01-28 DIAGNOSIS — Z5181 Encounter for therapeutic drug level monitoring: Secondary | ICD-10-CM | POA: Diagnosis not present

## 2017-01-28 DIAGNOSIS — I25810 Atherosclerosis of coronary artery bypass graft(s) without angina pectoris: Secondary | ICD-10-CM

## 2017-01-28 LAB — POCT INR: INR: 1.9

## 2017-01-29 ENCOUNTER — Telehealth (INDEPENDENT_AMBULATORY_CARE_PROVIDER_SITE_OTHER): Payer: Self-pay | Admitting: *Deleted

## 2017-01-29 NOTE — Telephone Encounter (Signed)
Referring MD/PCP: nelson   Procedure: tcs  Reason/Indication:  fam hx colon ca, hx polyps  Has patient had this procedure before?  Yes, 5 yrs ago -- scanned  If so, when, by whom and where?    Is there a family history of colon cancer?  Yes, niece  Who?  What age when diagnosed?    Is patient diabetic?   no      Does patient have prosthetic heart valve or mechanical valve?  no  Do you have a pacemaker?  no  Has patient ever had endocarditis? no  Has patient had joint replacement within last 12 months?  no  Does patient tend to be constipated or take laxatives? no  Does patient have a history of alcohol/drug use?  no  Is patient on Coumadin, Plavix and/or Aspirin? no  Medications: see epic  Allergies: sulfur drugs  Medication Adjustment per Dr Laural Golden: asa 2 days, warfain 5 days  Procedure date & time:

## 2017-02-01 ENCOUNTER — Ambulatory Visit (HOSPITAL_COMMUNITY)
Admission: RE | Admit: 2017-02-01 | Discharge: 2017-02-01 | Disposition: A | Discharge status: Home / Self Care | Payer: Medicare Other | Source: Ambulatory Visit | Attending: Family Medicine | Admitting: Family Medicine

## 2017-02-01 DIAGNOSIS — Z1231 Encounter for screening mammogram for malignant neoplasm of breast: Secondary | ICD-10-CM | POA: Diagnosis not present

## 2017-02-01 DIAGNOSIS — Z1239 Encounter for other screening for malignant neoplasm of breast: Secondary | ICD-10-CM

## 2017-02-08 ENCOUNTER — Encounter: Payer: Self-pay | Admitting: Neurology

## 2017-02-08 ENCOUNTER — Ambulatory Visit (INDEPENDENT_AMBULATORY_CARE_PROVIDER_SITE_OTHER): Payer: Medicare Other | Admitting: Neurology

## 2017-02-08 VITALS — BP 94/50 | HR 58 | Resp 16 | Ht 64.0 in | Wt 161.0 lb

## 2017-02-08 DIAGNOSIS — M549 Dorsalgia, unspecified: Secondary | ICD-10-CM | POA: Insufficient documentation

## 2017-02-08 DIAGNOSIS — I25810 Atherosclerosis of coronary artery bypass graft(s) without angina pectoris: Secondary | ICD-10-CM

## 2017-02-08 DIAGNOSIS — M7918 Myalgia, other site: Secondary | ICD-10-CM

## 2017-02-08 DIAGNOSIS — G8929 Other chronic pain: Secondary | ICD-10-CM | POA: Diagnosis not present

## 2017-02-08 DIAGNOSIS — M546 Pain in thoracic spine: Secondary | ICD-10-CM | POA: Insufficient documentation

## 2017-02-08 DIAGNOSIS — M791 Myalgia: Secondary | ICD-10-CM

## 2017-02-08 HISTORY — DX: Dorsalgia, unspecified: M54.9

## 2017-02-08 MED ORDER — ETODOLAC 400 MG PO TABS: 400.0000 mg | ORAL_TABLET | Freq: Two times a day (BID) | ORAL | 5 refills | Status: DC | PRN

## 2017-02-08 MED ORDER — OXYCODONE-ACETAMINOPHEN 5-325 MG PO TABS: 1.0000 | ORAL_TABLET | Freq: Every day | ORAL | 0 refills | Status: DC | PRN

## 2017-02-08 NOTE — Progress Notes (Addendum)
[facility]  PATIENT: Heather [name] DOB: [birth year]  REFERRING DOCTOR OR PCP:  Blanchie Serve SOURCE:   _________________________________   HISTORICAL  CHIEF COMPLAINT:  Chief Complaint  Patient presents with  . Back Pain    [name] is here with her husband [name] for eval of chronic right sided back pain--sts. tpi's and Percocet have helped in the past./fim    HISTORY OF PRESENT ILLNESS:  I had the pleasure seeing you patient, Heather [name], [facility] for neurologic consultation regarding her back pain.  She is a 71 year old woman who has had chronic right-sided back pain since 2013.    Pain is just right of midline in the lower thoracic paraspinal region.   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    Pain intensifies sporadically but often triggeref by household chores or gardening.    When more severe, using her arm intensifies the pain.   Pain is better with a TENS unit, trigger point and percocet.    A heating pad sometimes helps the pain.  Marijuana has helped her pain when more severe.  Cannabinoid oil did not help.    Flexeril has not helped.    Ibuprofen (800 mg) can help but she tries not to take over a week.  She denies any numbness or weakness in her arms or legs.   No bowel or bladder changes.    She is on coumadin for recently diagnose atrial fibrillation.      She was getting trigger point injections every 3-6 months for most of the past 4 years but needed more frequently last year when she was packing up to move to this area.   They were injected into the right thoracic paraspinal muscles and trapezius region.     I reviewed her MRI results from 10/14/2013. The MRI of the brain showed age related atrophy and minimal chronic buccal vascular ischemic change. MRI of the cervical spine showed multilevel mild degenerative changes with left paramedian disc herniation at C3-C4 and left disc protrusion at C4-C5 and  C5-C6 and midline disc herniation at C6-C7. There was no report of nerve root compression. MRI of the thoracic spine showed disc desiccation but no herniation or protrusions. MRI of the lumbar spine showed disc bulges at T12-L1 and L2-L3 and disc bulge with facet hypertrophy at L3-L4 and disc bulge with right foraminal annular tear at L4-L5.   REVIEW OF SYSTEMS: Constitutional: No fevers, chills, sweats, or change in appetite Eyes: No visual changes, double vision, eye pain Ear, nose and throat: No hearing loss, ear pain, nasal congestion, sore throat Cardiovascular: No chest pain, palpitations.   She has recently diagnose atrial fibrillation. Respiratory: No shortness of breath at rest or with exertion.   No wheezes GastrointestinaI: No nausea, vomiting, diarrhea, abdominal pain, fecal incontinence Genitourinary: No dysuria, urinary retention or frequency.  No nocturia. Musculoskeletal: No neck pain, back pain/thoracic pain as above Integumentary: No rash, pruritus, skin lesions Neurological: as above Psychiatric: No depression at this time.  No anxiety Endocrine: No palpitations, diaphoresis, change in appetite, change in weigh or increased thirst Hematologic/Lymphatic: No anemia, purpura, petechiae. Allergic/Immunologic: No itchy/runny eyes, nasal congestion, recent allergic reactions, rashes  ALLERGIES: Allergies  Allergen Reactions  . Statins Other (See Comments)    Intolerant all statins  . Sulfa Antibiotics Rash    HOME MEDICATIONS:  Current Outpatient Prescriptions:  .  Biotin 10000 MCG TABS, Take by mouth., Disp: , Rfl:  .  buPROPion [facility]  SR) 150 MG 12 hr tablet, Take 150 mg by mouth 2 (two) times daily., Disp: , Rfl:  .  co-enzyme Q-10 30 MG capsule, Take 30 mg by mouth 3 (three) times daily., Disp: , Rfl:  .  docusate sodium (COLACE) 100 MG capsule, Take 100 mg by mouth daily., Disp: , Rfl:  .  estradiol (ESTRACE) 0.5 MG tablet, Take 1 tablet (0.5 mg total) by  mouth daily., Disp: 90 tablet, Rfl: 0 .  Multiple Vitamin (MULTIVITAMIN) tablet, Take 1 tablet by mouth daily., Disp: , Rfl:  .  omega-3 acid ethyl esters (LOVAZA) 1 g capsule, Take 2 g by mouth 2 (two) times daily., Disp: , Rfl:  .  warfarin (COUMADIN) 5 MG tablet, Take 2 tablets daily or as directed, Disp: 60 tablet, Rfl: 3 .  etodolac (LODINE) 400 MG tablet, Take 1 tablet (400 mg total) by mouth 2 (two) times daily as needed., Disp: 60 tablet, Rfl: 5 .  oxyCODONE-acetaminophen (ROXICET) 5-325 MG tablet, Take 1 tablet by mouth daily as needed for severe pain., Disp: 30 tablet, Rfl: 0  PAST MEDICAL HISTORY: Past Medical History:  Diagnosis Date  . Adrenal adenoma   . Allergy   . Anemia   . Anxiety   . CAD (coronary artery disease)    50-60% mid LAD at cardiac catheterization 2014 [facility])  . Cataract   . Depression   . DJD (degenerative joint disease) of cervical spine 09/12/2016  . History of cardiomyopathy   . History of tobacco use   . Hyperlipidemia   . Neuromuscular disorder ([place])   . PAF (paroxysmal atrial fibrillation) [facility])    Diagnosis July 2017 - spontaneously resolved  . Statin intolerance     PAST SURGICAL HISTORY: Past Surgical History:  Procedure Laterality Date  . ABDOMINAL HYSTERECTOMY  1980   endometriosis  . TONSILLECTOMY  1954    FAMILY HISTORY: Family History  Problem Relation Age of Onset  . COPD Mother   . Hearing loss Mother   . Stroke Mother   . Heart disease Mother     chf, died at 48  . Arthritis Father   . Hypertension Father   . Heart disease Father     cad, pvd, died at 55  . Heart disease Sister     heart valve  . Arthritis Sister   . Hyperlipidemia Brother   . Hypertension Brother     SOCIAL HISTORY:  Social History   Social History  . Marital status: Married    Spouse name: Al  . Number of children: 0  . Years of education: 68   Occupational History  . 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  Smoking status: Former Smoker    Packs/day: 1.00    Types: Cigarettes    Start date: 11/12/1965    Quit date: 09/06/2008  . Smokeless tobacco: Never Used  . Alcohol use 1.2 oz/week    2 Glasses of wine per week  . Drug use: No  . Sexual activity: Yes    Birth control/ protection: Post-menopausal   Other Topics Concern  . 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   General: The patient is well-developed and well-nourished and in no acute distress  Neck: The neck is supple, no carotid bruits are noted.  The neck is nontender.  Cardiovascular: The heart has a regular rate and rhythm with a normal S1 and S2. There were no murmurs, gallops or rubs.   Skin: Extremities are without rash or edema.  Musculoskeletal:  Back is tender in the right lower thoracic paraspinal region.  Neurologic Exam  Mental status: The patient is alert and oriented x 3 at the time of the examination. The patient has apparent normal recent and remote memory, with an apparently normal attention span and concentration ability.   Speech is normal.  Cranial nerves: Extraocular movements are full.   There is good facial sensation to soft touch bilaterally.Facial strength is normal.  Trapezius and sternocleidomastoid strength is normal. No dysarthria is noted.  The tongue is midline, and the patient has symmetric elevation of the soft palate. No obvious hearing deficits are noted.  Motor:  Muscle bulk is normal.   Tone is normal. Strength is  5 / 5 in all 4 extremities.   Sensory: Sensory testing is intact to pinprick, soft touch and vibration sensation in all 4 extremities.  Coordination: Cerebellar testing reveals good finger-nose-finger and  heel-to-shin bilaterally.  Gait and station: Station is normal.   Gait is normal. Tandem gait is normal. Romberg is negative.   Reflexes: Deep tendon reflexes are symmetric and normal bilaterally.   Plantar responses are flexor.    DIAGNOSTIC DATA (LABS, IMAGING, TESTING) - I reviewed patient records, labs, notes, testing and imaging myself where available.      ASSESSMENT AND PLAN  Myofascial pain syndrome  Chronic right-sided thoracic back pain   In summary, Ranell Finelli is a 71 year old woman with right thoracic pain likely due to a myofascial pain syndrome.     I did a TPI with 80 mg Depo-Medrol in [place] into the mid to lower thoracic paraspinal muscles on the right using sterile technique. She tolerated the procedure well and felt a little better afterwards.   I refilled her Percocet prescription (last one was 30 pills several months ago).  I will also write for etodolac 400 mg when necessary so she has something stronger than Motrin if needed. She is advised to stay active and exercises as tolerated. Stretching before major chores might also be helpful.  She will return to see me in 4 months or sooner if there are new or worsening neurologic symptoms.  Thank you for asking me to see Ms. Beverely Low for a neurologic consultation. Please let me know if I can be of further assistance with her or other patients in the future.   Duan Scharnhorst A. Felecia Shelling, MD, PhD 8/67/5449, 20:10 PM Certified in Neurology, Clinical Neurophysiology, Sleep Medicine, Pain Medicine and Neuroimaging  [facility] [address] [place], [place] [address] [phone]

## 2017-02-11 ENCOUNTER — Telehealth: Payer: Self-pay | Admitting: Neurology

## 2017-02-11 ENCOUNTER — Ambulatory Visit (INDEPENDENT_AMBULATORY_CARE_PROVIDER_SITE_OTHER): Payer: Medicare Other | Admitting: *Deleted

## 2017-02-11 DIAGNOSIS — I25810 Atherosclerosis of coronary artery bypass graft(s) without angina pectoris: Secondary | ICD-10-CM

## 2017-02-11 DIAGNOSIS — Z5181 Encounter for therapeutic drug level monitoring: Secondary | ICD-10-CM

## 2017-02-11 DIAGNOSIS — I480 Paroxysmal atrial fibrillation: Secondary | ICD-10-CM | POA: Diagnosis not present

## 2017-02-11 LAB — POCT INR: INR: 2.5

## 2017-02-11 MED ORDER — CYCLOBENZAPRINE HCL 5 MG PO TABS: 5.0000 mg | ORAL_TABLET | Freq: Two times a day (BID) | ORAL | 5 refills | Status: DC | PRN

## 2017-02-11 NOTE — Addendum Note (Signed)
Addended by: France Ravens I on: 02/11/2017 05:23 PM   Modules accepted: Orders

## 2017-02-11 NOTE — Telephone Encounter (Addendum)
Alicia/[facility] [phone] called for the pharmacist, said the pt brought in RX for etodolac (LODINE) 400 MG tablet and wants to know if provider is aware the pt is taking warfarin. Please call

## 2017-02-11 NOTE — Telephone Encounter (Signed)
I have spoken with the pharmacist at [facility], and per RAS, gave rx. for Flexeril 5mg  po #60 one po bid prn.  LMOM for A[place] to let her know new rx. has been sent in.  She does not need to return this call unless she has questions/fim

## 2017-02-15 MED ORDER — TIZANIDINE HCL 4 MG PO CAPS: 4.0000 mg | ORAL_CAPSULE | Freq: Three times a day (TID) | ORAL | 5 refills | Status: DC

## 2017-02-15 NOTE — Addendum Note (Signed)
Addended by: France Ravens I on: 02/15/2017 11:35 AM   Modules accepted: Orders

## 2017-02-15 NOTE — Telephone Encounter (Signed)
Patient called office in reference to Flexeril 5mg .  Patient states she has had no relief with pain when taken Flexeril in the past, but willing to try.  Per patient since trigger point injections a week ago patient states she has developed neck shoulder and back pain.  Per patient injections did not work but made them worse.  Please call

## 2017-02-15 NOTE — Telephone Encounter (Signed)
I have spoken with Katrice and per RAS, explained that she may have some post-procedural pain after inj. She can d/c Flexeril, try Tizanidine 4mg  po tid to see if she gets more relief with this.  She is agreeable.  Rx. escribed to [facility] as requested/fm

## 2017-02-26 NOTE — Progress Notes (Signed)
Cardiology Office Note  Date: 02/27/2017   ID: Heather [name], DOB Oct 07, 1946, MRN [id number]  PCP: Raylene Everts, MD  Primary Cardiologist: Rozann Lesches, MD   Chief Complaint  Patient presents with  . Coronary Artery Disease  . PAF    History of Present Illness: Heather [name] is a 71 y.o. female that I met in January. She is here today with her husband for a follow-up visit. Since last encounter she reports to relatively brief episodes of palpitations that were presumably atrial fibrillation. Otherwise she feels well, no decrease in stamina. She has been doing some work in her garden recently.  CHADSVASC score is 3, she is on Coumadin with follow-up in the anticoagulation clinic. She has tolerated the medication so far, no significant bleeding problems.  Follow-up echocardiogram from January as outlined below, LVEF 50-55% range.  Past Medical History:  Diagnosis Date  . Adrenal adenoma   . Allergy   . Anemia   . Anxiety   . CAD (coronary artery disease)    50-60% mid LAD at cardiac catheterization 2014 [facility])  . Cataract   . Depression   . DJD (degenerative joint disease) of cervical spine 09/12/2016  . History of cardiomyopathy   . History of tobacco use   . Hyperlipidemia   . Neuromuscular disorder ([place])   . PAF (paroxysmal atrial fibrillation) [facility])    Diagnosis July 2017 - spontaneously resolved  . Statin intolerance     Past Surgical History:  Procedure Laterality Date  . ABDOMINAL HYSTERECTOMY  1980   endometriosis  . TONSILLECTOMY  1954    Current Outpatient Prescriptions  Medication Sig Dispense Refill  . Biotin 10000 MCG TABS Take by mouth.    Marland Kitchen buPROPion (WELLBUTRIN SR) 150 MG 12 hr tablet Take 150 mg by mouth 2 (two) times daily.    Marland Kitchen co-enzyme Q-10 30 MG capsule Take 30 mg by mouth 3 (three) times daily.    Marland Kitchen docusate sodium (COLACE) 100 MG capsule Take 100 mg by mouth daily.    Marland Kitchen estradiol (ESTRACE) 0.5 MG tablet Take 1 tablet (0.5 mg  total) by mouth daily. 90 tablet 0  . Multiple Vitamin (MULTIVITAMIN) tablet Take 1 tablet by mouth daily.    Marland Kitchen omega-3 acid ethyl esters (LOVAZA) 1 g capsule Take 2 g by mouth 2 (two) times daily.    Marland Kitchen oxyCODONE-acetaminophen (ROXICET) 5-325 MG tablet Take 1 tablet by mouth daily as needed for severe pain. 30 tablet 0  . warfarin (COUMADIN) 5 MG tablet Take 2 tablets daily or as directed 60 tablet 3  . tiZANidine (ZANAFLEX) 4 MG capsule Take 1 capsule (4 mg total) by mouth 3 (three) times daily. (Patient not taking: Reported on 02/27/2017) 90 capsule 5   No current facility-administered medications for this visit.    Allergies:  Statins and Sulfa antibiotics   Social History: The patient  reports that she quit smoking about 8 years ago. Her smoking use included Cigarettes. She started smoking about 51 years ago. She smoked 1.00 pack per day. She has never used smokeless tobacco. She reports that she drinks about 1.2 oz of alcohol per week . She reports that she does not use drugs.   ROS:  Please see the history of present illness. Otherwise, complete review of systems is positive for none.  All other systems are reviewed and negative.   Physical Exam: VS:  BP 130/70   Pulse 76   Ht 5' 4"  (1.626 m)   Wt  153 lb (69.4 kg)   LMP 07/08/1979 (Approximate)   SpO2 98%   BMI 26.26 kg/m , BMI Body mass index is 26.26 kg/m.  Wt Readings from Last 3 Encounters:  02/27/17 153 lb (69.4 kg)  02/08/17 161 lb (73 kg)  11/16/16 164 lb (74.4 kg)    General: Patient appears comfortable at rest. Appears younger than stated age. HEENT: Conjunctiva and lids normal, oropharynx clear. Neck: Supple, no elevated JVP or carotid bruits, no thyromegaly. Lungs: Clear to auscultation, nonlabored breathing at rest. Cardiac: Regular rate and rhythm, no S3 or significant systolic murmur, no pericardial rub. Abdomen: Soft, nontender, bowel sounds present, no guarding or rebound. Extremities: No pitting edema,  distal pulses 2+. Skin: Warm and dry. Musculoskeletal: No kyphosis. Neuropsychiatric: Alert and oriented x3, affect grossly appropriate.  ECG: I personally reviewed the tracing from 11/16/2016 which showed sinus bradycardia with nonspecific ST-T changes.  Recent Labwork:  October 2017: BUN 17, creatinine 0.8, cholesterol 385, triglycerides 78, HDL 81, LDL 288, AST 14, ALT 15  Other Studies Reviewed Today:  Cardiac catheterization 03/06/2013 [facility]): Left main with mild luminal irregularities, 50-60% mid LAD stenosis spanning the origin of moderate caliber second diagonal branch which is larger in caliber than the distal LAD, left circumflex with mild luminal irregularities, RCA with mild luminal irregularities.  Echocardiogram 11/21/2016: Study Conclusions  - Left ventricle: The cavity size was normal. Systolic function was   normal. The estimated ejection fraction was in the range of 50%   to 55%. Doppler parameters are consistent with abnormal left   ventricular relaxation (grade 1 diastolic dysfunction). - Mitral valve: Calcified annulus. Normal thickness leaflets . - Left atrium: The atrium was mildly to moderately dilated. - Tricuspid valve: There was mild regurgitation.  Assessment and Plan:  1. Paroxysmal atrial fibrillation with CHADSVASC score of 3. She is tolerating Coumadin and continues to follow in the anticoagulation clinic. Reports relatively brief episodes of palpitations that are presumably PAF. We will continue to monitor for now. We have discussed the possibility of EP consultation for consideration of antiarrhythmic therapy and/or ablation if symptoms escalate.  2. History of moderate nonobstructive CAD without active angina symptoms.  3. History of statin intolerance with substantial hyperlipidemia. She follows with PCP. I have discussed with her consideration of referral to the lipid clinic. She has been somewhat hesitant to consider  other medical therapies.  4. History of reported Tako-tsubo cardiomyopathy and 2015, LVEF normal range by recent follow-up echocardiogram.  Current medicines were reviewed with the patient today.  Disposition: Follow-up in 6 months.  Signed, Satira Sark, MD, [facility] 02/27/2017 10:20 AM    [place] [facility] [address], [place], [place] [address] Phone: [phone]; Fax: [phone]

## 2017-02-27 ENCOUNTER — Ambulatory Visit (INDEPENDENT_AMBULATORY_CARE_PROVIDER_SITE_OTHER): Payer: Medicare Other | Admitting: Cardiology

## 2017-02-27 ENCOUNTER — Encounter: Payer: Self-pay | Admitting: Cardiology

## 2017-02-27 VITALS — BP 130/70 | HR 76 | Ht 64.0 in | Wt 153.0 lb

## 2017-02-27 DIAGNOSIS — I480 Paroxysmal atrial fibrillation: Secondary | ICD-10-CM

## 2017-02-27 DIAGNOSIS — Z789 Other specified health status: Secondary | ICD-10-CM | POA: Diagnosis not present

## 2017-02-27 DIAGNOSIS — I25810 Atherosclerosis of coronary artery bypass graft(s) without angina pectoris: Secondary | ICD-10-CM | POA: Diagnosis not present

## 2017-02-27 DIAGNOSIS — Z8679 Personal history of other diseases of the circulatory system: Secondary | ICD-10-CM

## 2017-02-27 NOTE — Patient Instructions (Addendum)
Your physician wants you to follow-up in: 6 months Dr McDowell You will receive a reminder letter in the mail two months in advance. If you don't receive a letter, please call our office to schedule the follow-up appointment.  Your physician recommends that you continue on your current medications as directed. Please refer to the Current Medication list given to you today.    If you need a refill on your cardiac medications before your next appointment, please call your pharmacy.       Thank you for choosing [facility] !         

## 2017-03-04 ENCOUNTER — Encounter: Payer: Self-pay | Admitting: *Deleted

## 2017-03-04 ENCOUNTER — Other Ambulatory Visit: Payer: Self-pay | Admitting: Family Medicine

## 2017-03-04 NOTE — Telephone Encounter (Signed)
Seen 12 [phone] 17

## 2017-03-05 ENCOUNTER — Ambulatory Visit (INDEPENDENT_AMBULATORY_CARE_PROVIDER_SITE_OTHER): Payer: Medicare Other | Admitting: *Deleted

## 2017-03-05 ENCOUNTER — Other Ambulatory Visit: Payer: Self-pay

## 2017-03-05 DIAGNOSIS — Z5181 Encounter for therapeutic drug level monitoring: Secondary | ICD-10-CM

## 2017-03-05 DIAGNOSIS — I480 Paroxysmal atrial fibrillation: Secondary | ICD-10-CM

## 2017-03-05 LAB — POCT INR: INR: 3.4

## 2017-03-05 MED ORDER — BUPROPION HCL ER (SR) 150 MG PO TB12: 150.0000 mg | ORAL_TABLET | Freq: Two times a day (BID) | ORAL | 3 refills | Status: DC

## 2017-03-05 NOTE — Telephone Encounter (Signed)
Seen 10/02/16

## 2017-03-06 ENCOUNTER — Ambulatory Visit (HOSPITAL_COMMUNITY): Admit: 2017-03-06 | Payer: Medicare Other | Admitting: Internal Medicine

## 2017-03-06 ENCOUNTER — Encounter (HOSPITAL_COMMUNITY): Payer: Self-pay

## 2017-03-06 SURGERY — COLONOSCOPY
Anesthesia: Moderate Sedation

## 2017-03-07 ENCOUNTER — Ambulatory Visit (INDEPENDENT_AMBULATORY_CARE_PROVIDER_SITE_OTHER): Payer: Medicare Other | Admitting: Family Medicine

## 2017-03-07 ENCOUNTER — Encounter: Payer: Self-pay | Admitting: Family Medicine

## 2017-03-07 VITALS — BP 118/64 | HR 60 | Temp 97.8°F | Resp 16 | Ht 64.0 in | Wt 152.1 lb

## 2017-03-07 DIAGNOSIS — R05 Cough: Secondary | ICD-10-CM | POA: Diagnosis not present

## 2017-03-07 DIAGNOSIS — R059 Cough, unspecified: Secondary | ICD-10-CM

## 2017-03-07 MED ORDER — BENZONATATE 200 MG PO CAPS: 200.0000 mg | ORAL_CAPSULE | Freq: Three times a day (TID) | ORAL | 1 refills | Status: DC | PRN

## 2017-03-07 NOTE — Patient Instructions (Signed)
Push fluids Take the tessalon for cough May try zinc - zicam for cold/viral symptoms  Call for problems

## 2017-03-07 NOTE — Progress Notes (Signed)
Chief Complaint  Patient presents with  . URI    x 1 week  Patient had a cough for about 7 or 8 days. Mild Raynaud's and sore throat. Initially she was quite exhausted with some fever. She spent one day in bed. She is gradually improved. Now she just has persistent cough. No fever. No chest pain. No sputum.  . Patient is going to visit an elderly friend this weekend and is here to be checked to make sure she is not contagious.   Patient Active Problem List   Diagnosis Date Noted  . Thoracic back pain 02/08/2017  . Hx of colonic polyps 12/03/2016  . Family history of colon cancer 12/03/2016  . Encounter for therapeutic drug monitoring 11/21/2016  . CAD (coronary artery disease) 09/12/2016  . PAF (paroxysmal atrial fibrillation) ([place]) 09/12/2016  . Syncope 09/12/2016  . DJD (degenerative joint disease) of cervical spine 09/12/2016  . Statin intolerance 09/12/2016  . TIA (transient ischemic attack) 09/12/2016  . Diverticulitis of colon 09/12/2016  . Myofascial pain syndrome 09/06/2016  . Takotsubo syndrome 09/06/2016  . HLD (hyperlipidemia) 09/06/2016  . Depression with anxiety 09/06/2016  . Recurrent UTI (urinary tract infection) 09/06/2016    Outpatient Encounter Prescriptions as of 03/07/2017  Medication Sig  . Biotin 10000 MCG TABS Take by mouth.  Marland Kitchen buPROPion (WELLBUTRIN SR) 150 MG 12 hr tablet Take 1 tablet (150 mg total) by mouth 2 (two) times daily.  Marland Kitchen co-enzyme Q-10 30 MG capsule Take 30 mg by mouth 3 (three) times daily.  Marland Kitchen docusate sodium (COLACE) 100 MG capsule Take 100 mg by mouth daily.  Marland Kitchen estradiol (ESTRACE) 0.5 MG tablet Take every other day then discontinue.  . Multiple Vitamin (MULTIVITAMIN) tablet Take 1 tablet by mouth daily.  Marland Kitchen omega-3 acid ethyl esters (LOVAZA) 1 g capsule Take 2 g by mouth 2 (two) times daily.  Marland Kitchen oxyCODONE-acetaminophen (ROXICET) 5-325 MG tablet Take 1 tablet by mouth daily as needed for severe pain.  Marland Kitchen tiZANidine (ZANAFLEX) 4 MG capsule  Take 1 capsule (4 mg total) by mouth 3 (three) times daily.  Marland Kitchen warfarin (COUMADIN) 5 MG tablet Take 2 tablets daily or as directed  . benzonatate (TESSALON) 200 MG capsule Take 1 capsule (200 mg total) by mouth 3 (three) times daily as needed for cough.  . [DISCONTINUED] buPROPion (WELLBUTRIN SR) 150 MG 12 hr tablet Take 150 mg by mouth 2 (two) times daily.   No facility-administered encounter medications on file as of 03/07/2017.     Allergies  Allergen Reactions  . Statins Other (See Comments)    Intolerant all statins  . Sulfa Antibiotics Rash    Review of Systems  Constitutional: Negative for activity change, appetite change and fatigue.  HENT: Negative for congestion, sinus pressure and sore throat.   Eyes: Negative for redness and visual disturbance.  Respiratory: Positive for cough. Negative for shortness of breath and wheezing.   Cardiovascular: Negative for palpitations.  Gastrointestinal: Negative for nausea and vomiting.  Neurological: Negative for headaches.   BP 118/64 (BP Location: Right Arm, Patient Position: Sitting, Cuff Size: Normal)   Pulse 60   Temp 97.8 F (36.6 C) (Temporal)   Resp 16   Ht 5\' 4"  (1.626 m)   Wt 152 lb 1.9 oz (69 kg)   LMP 07/08/1979 (Approximate)   SpO2 95%   BMI 26.11 kg/m   Physical Exam  Constitutional: She is oriented to person, place, and time. She appears well-developed and well-nourished. No distress.  HENT:  Head: Normocephalic and atraumatic.  Right Ear: External ear normal.  Left Ear: External ear normal.  Nose: Nose normal.  Mouth/Throat: Oropharynx is clear and moist.  No sinus tenderness  Eyes: Conjunctivae are normal. Pupils are equal, round, and reactive to light.  Neck: Normal range of motion.  Cardiovascular: Normal rate, regular rhythm and normal heart sounds.   Pulmonary/Chest: Effort normal and breath sounds normal. She has no rales.  Lungs are clear  Lymphadenopathy:    She has no cervical adenopathy.    Neurological: She is alert and oriented to person, place, and time.  Psychiatric: She has a normal mood and affect. Her behavior is normal. Thought content normal.    ASSESSMENT/PLAN:  1. Cough Discussed that this is just some bronchial irritation left over from her viral illness. I don't like she is contagious. Symptomatic care discussed.   Patient Instructions  Push fluids Take the tessalon for cough May try zinc - zicam for cold/viral symptoms  Call for problems   Raylene Everts, MD

## 2017-03-13 ENCOUNTER — Ambulatory Visit (INDEPENDENT_AMBULATORY_CARE_PROVIDER_SITE_OTHER): Payer: Medicare Other | Admitting: *Deleted

## 2017-03-13 DIAGNOSIS — Z86010 Personal history of colonic polyps: Secondary | ICD-10-CM | POA: Diagnosis not present

## 2017-03-13 DIAGNOSIS — K625 Hemorrhage of anus and rectum: Secondary | ICD-10-CM | POA: Diagnosis not present

## 2017-03-13 DIAGNOSIS — Z5181 Encounter for therapeutic drug level monitoring: Secondary | ICD-10-CM | POA: Diagnosis not present

## 2017-03-13 DIAGNOSIS — I480 Paroxysmal atrial fibrillation: Secondary | ICD-10-CM | POA: Diagnosis not present

## 2017-03-13 DIAGNOSIS — R1314 Dysphagia, pharyngoesophageal phase: Secondary | ICD-10-CM | POA: Diagnosis not present

## 2017-03-13 DIAGNOSIS — Z800 Family history of malignant neoplasm of digestive organs: Secondary | ICD-10-CM | POA: Diagnosis not present

## 2017-03-13 DIAGNOSIS — I25810 Atherosclerosis of coronary artery bypass graft(s) without angina pectoris: Secondary | ICD-10-CM

## 2017-03-13 LAB — POCT INR: INR: 2.5

## 2017-03-18 ENCOUNTER — Telehealth: Payer: Self-pay | Admitting: *Deleted

## 2017-03-18 ENCOUNTER — Encounter: Payer: Self-pay | Admitting: *Deleted

## 2017-03-18 NOTE — Telephone Encounter (Signed)
OK to hold coumadin 5 days before procedure.  Resume coumadin night of procedure if OK with MD.  Take coumadin 10mg  daily until INR check on 05/13/17. Faxed to Dr Encarnacion Slates office.

## 2017-03-18 NOTE — Telephone Encounter (Signed)
-----   Message from Satira Sark, MD sent at 03/18/2017  9:55 AM EDT ----- I do not think that she clearly needs to have Lovenox bridging.  ----- Message ----- From: Malen Gauze, RN Sent: 03/18/2017   8:32 AM To: Satira Sark, MD  Do you think this pt needs to be bridged with Lovenox for colonoscopy/endoscopy?  Chart history mentions TIA but I can't find any documentation of that.  Have reviewed 2 MRI's of brain with no mention of TIA.  Last OV pt was in [facility]. Please advise. Thanks.

## 2017-03-27 ENCOUNTER — Ambulatory Visit (INDEPENDENT_AMBULATORY_CARE_PROVIDER_SITE_OTHER): Payer: Medicare Other | Admitting: Family Medicine

## 2017-03-27 ENCOUNTER — Encounter: Payer: Self-pay | Admitting: Family Medicine

## 2017-03-27 VITALS — BP 120/76 | HR 60 | Temp 97.5°F | Resp 16 | Ht 64.0 in

## 2017-03-27 DIAGNOSIS — T22211A Burn of second degree of right forearm, initial encounter: Secondary | ICD-10-CM

## 2017-03-27 NOTE — Patient Instructions (Signed)
Antibiotic ointment 2-3 times a day Call if worse

## 2017-03-27 NOTE — Progress Notes (Signed)
    Chief Complaint  Patient presents with  . Burn    right arm   BURN ON ARM WANTS CHECK FOR HEALING/INFECTION NOT PAINFUL Declines tetanus update  Patient Active Problem List   Diagnosis Date Noted  . Thoracic back pain 02/08/2017  . Hx of colonic polyps 12/03/2016  . Family history of colon cancer 12/03/2016  . Encounter for therapeutic drug monitoring 11/21/2016  . CAD (coronary artery disease) 09/12/2016  . PAF (paroxysmal atrial fibrillation) ([place]) 09/12/2016  . Syncope 09/12/2016  . DJD (degenerative joint disease) of cervical spine 09/12/2016  . Statin intolerance 09/12/2016  . TIA (transient ischemic attack) 09/12/2016  . Diverticulitis of colon 09/12/2016  . Myofascial pain syndrome 09/06/2016  . Takotsubo syndrome 09/06/2016  . HLD (hyperlipidemia) 09/06/2016  . Depression with anxiety 09/06/2016  . Recurrent UTI (urinary tract infection) 09/06/2016    Outpatient Encounter Prescriptions as of 03/27/2017  Medication Sig  . Biotin 10000 MCG TABS Take by mouth.  Marland Kitchen buPROPion (WELLBUTRIN SR) 150 MG 12 hr tablet Take 1 tablet (150 mg total) by mouth 2 (two) times daily.  Marland Kitchen co-enzyme Q-10 30 MG capsule Take 30 mg by mouth 3 (three) times daily.  Marland Kitchen docusate sodium (COLACE) 100 MG capsule Take 100 mg by mouth daily.  Marland Kitchen estradiol (ESTRACE) 0.5 MG tablet Take every other day then discontinue.  . Multiple Vitamin (MULTIVITAMIN) tablet Take 1 tablet by mouth daily.  Marland Kitchen omega-3 acid ethyl esters (LOVAZA) 1 g capsule Take 2 g by mouth 2 (two) times daily.  Marland Kitchen oxyCODONE-acetaminophen (ROXICET) 5-325 MG tablet Take 1 tablet by mouth daily as needed for severe pain.  Marland Kitchen tiZANidine (ZANAFLEX) 4 MG capsule Take 1 capsule (4 mg total) by mouth 3 (three) times daily.  Marland Kitchen warfarin (COUMADIN) 5 MG tablet Take 2 tablets daily or as directed  . [DISCONTINUED] benzonatate (TESSALON) 200 MG capsule Take 1 capsule (200 mg total) by mouth 3 (three) times daily as needed for cough. (Patient not  taking: Reported on 03/27/2017)   No facility-administered encounter medications on file as of 03/27/2017.     Allergies  Allergen Reactions  . Statins Other (See Comments)    Intolerant all statins  . Sulfa Antibiotics Rash    Review of Systems  Constitutional: Negative for chills, fatigue and fever.  Skin: Positive for wound.  All other systems reviewed and are negative.   BP 120/76 (BP Location: Left Arm, Patient Position: Sitting, Cuff Size: Normal)   Pulse 60   Temp 97.5 F (36.4 C) (Temporal)   Resp 16   Ht 5\' 4"  (1.626 m)   LMP 07/08/1979 (Approximate)   SpO2 99%   Physical Exam  Constitutional: She appears well-developed and well-nourished. No distress.  HENT:  Head: Normocephalic and atraumatic.  Mouth/Throat: Oropharynx is clear and moist.  Skin: Skin is warm and dry. There is erythema.  Linear burn right forearm dorsum measures 6 X 1 cm.  Minimal surrounding erythema, no tenderness  Psychiatric: She has a normal mood and affect. Her behavior is normal.    ASSESSMENT/PLAN:  1. Partial thickness burn of right forearm, initial encounter    Patient Instructions  Antibiotic ointment 2-3 times a day Call if worse   Raylene Everts, MD

## 2017-03-28 ENCOUNTER — Ambulatory Visit: Payer: Medicare Other | Admitting: Family Medicine

## 2017-04-03 ENCOUNTER — Ambulatory Visit (INDEPENDENT_AMBULATORY_CARE_PROVIDER_SITE_OTHER): Payer: Medicare Other | Admitting: *Deleted

## 2017-04-03 DIAGNOSIS — I25810 Atherosclerosis of coronary artery bypass graft(s) without angina pectoris: Secondary | ICD-10-CM

## 2017-04-03 DIAGNOSIS — I480 Paroxysmal atrial fibrillation: Secondary | ICD-10-CM | POA: Diagnosis not present

## 2017-04-03 DIAGNOSIS — Z5181 Encounter for therapeutic drug level monitoring: Secondary | ICD-10-CM

## 2017-04-03 LAB — POCT INR: INR: 1.7

## 2017-04-09 ENCOUNTER — Other Ambulatory Visit: Payer: Self-pay | Admitting: Family Medicine

## 2017-04-10 ENCOUNTER — Other Ambulatory Visit: Payer: Self-pay

## 2017-04-10 MED ORDER — BUPROPION HCL ER (SR) 150 MG PO TB12: 150.0000 mg | ORAL_TABLET | Freq: Two times a day (BID) | ORAL | 1 refills | Status: DC

## 2017-04-11 ENCOUNTER — Telehealth: Payer: Self-pay | Admitting: Family Medicine

## 2017-04-11 NOTE — Telephone Encounter (Signed)
Are you going to give her more of this?

## 2017-04-11 NOTE — Telephone Encounter (Signed)
Patient wants to know why her medication (ESTRACE) was denied.  Cb#: [phone]

## 2017-04-12 NOTE — Telephone Encounter (Signed)
She had been told to take every other day then discontinue.  We discussed reducing and stopping medicine.  Can see GYN if feels she needs estrogen.

## 2017-04-15 ENCOUNTER — Other Ambulatory Visit: Payer: Self-pay | Admitting: Family Medicine

## 2017-04-15 NOTE — Telephone Encounter (Signed)
RETURNED [place] [phone]

## 2017-04-15 NOTE — Telephone Encounter (Signed)
Left message to call office,

## 2017-04-16 ENCOUNTER — Telehealth: Payer: Self-pay

## 2017-04-16 MED ORDER — ESTRADIOL 0.5 MG PO TABS: ORAL_TABLET | ORAL | 0 refills | Status: DC

## 2017-04-16 NOTE — Telephone Encounter (Signed)
-----   Message from Raylene Everts, MD sent at 04/16/2017  1:09 PM EDT ----- [facility] for GYN referral

## 2017-04-16 NOTE — Telephone Encounter (Signed)
Called Tia, aware.

## 2017-04-16 NOTE — Telephone Encounter (Signed)
Called and spoke to Darlina, is asking if you are willing to write rx one more time, and she will see GYN, also asking who you recommend she see for GYN?

## 2017-04-22 ENCOUNTER — Ambulatory Visit (INDEPENDENT_AMBULATORY_CARE_PROVIDER_SITE_OTHER): Payer: Medicare Other | Admitting: *Deleted

## 2017-04-22 DIAGNOSIS — I25810 Atherosclerosis of coronary artery bypass graft(s) without angina pectoris: Secondary | ICD-10-CM

## 2017-04-22 DIAGNOSIS — Z5181 Encounter for therapeutic drug level monitoring: Secondary | ICD-10-CM | POA: Diagnosis not present

## 2017-04-22 DIAGNOSIS — I480 Paroxysmal atrial fibrillation: Secondary | ICD-10-CM

## 2017-04-22 LAB — POCT INR: INR: 1.7

## 2017-05-01 ENCOUNTER — Encounter: Payer: Self-pay | Admitting: Neurology

## 2017-05-01 ENCOUNTER — Ambulatory Visit (INDEPENDENT_AMBULATORY_CARE_PROVIDER_SITE_OTHER): Payer: Medicare Other | Admitting: Neurology

## 2017-05-01 VITALS — BP 107/62 | HR 50 | Resp 16 | Ht 64.0 in | Wt 153.0 lb

## 2017-05-01 DIAGNOSIS — M791 Myalgia: Secondary | ICD-10-CM | POA: Diagnosis not present

## 2017-05-01 DIAGNOSIS — M546 Pain in thoracic spine: Secondary | ICD-10-CM

## 2017-05-01 DIAGNOSIS — G8929 Other chronic pain: Secondary | ICD-10-CM | POA: Diagnosis not present

## 2017-05-01 DIAGNOSIS — M7918 Myalgia, other site: Secondary | ICD-10-CM

## 2017-05-01 DIAGNOSIS — I25810 Atherosclerosis of coronary artery bypass graft(s) without angina pectoris: Secondary | ICD-10-CM

## 2017-05-01 MED ORDER — TRAMADOL HCL 50 MG PO TABS: 50.0000 mg | ORAL_TABLET | Freq: Every day | ORAL | 0 refills | Status: DC | PRN

## 2017-05-01 MED ORDER — OXYCODONE-ACETAMINOPHEN 5-325 MG PO TABS: 1.0000 | ORAL_TABLET | Freq: Every day | ORAL | 0 refills | Status: DC | PRN

## 2017-05-01 NOTE — Progress Notes (Signed)
[facility]  PATIENT: Heather [name] DOB: 1946/03/20  REFERRING DOCTOR OR PCP:  Blanchie Serve SOURCE:   _________________________________   HISTORICAL  CHIEF COMPLAINT:  Chief Complaint  Patient presents with  . Back Pain    Sts. no relief of right sided back pain with tpi given at last ov. Sts. she is not able to tolerate Tizanidine due to excessive drowsiness./fim    HISTORY OF PRESENT ILLNESS:  Heather [name] is a 71 year old woman who has had chronic right-sided mid back pain since 2013.      Her pain is about 1 inch right of midline in the lower thoracic paraspinal region. 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 Pain worsens often with household chores or gardening. The pain may improve with using a TENS unit, a trigger point or Percocet. Heating pad also sometimes helps. When pain is more severe she has smoked marijuana with some benefit. Cannabinol oil did not help. Flexeril did not help. More recently she took a tizanidine and was very sleepy though pain was better that day. More recently, she took a tizanidine 4 mg but fell asleep 1-2 hours.  The trigger point injection at the last visit (thoracic paraspinal) did not help.   She denies any numbness or weakness in her arms or legs.   No bowel or bladder changes.    She is on coumadin for recently diagnose atrial fibrillation.      She was getting trigger point injections every 3-6 months for most of the past 4 years but needed more frequently last year when she was packing up to move to this area.        I reviewed her MRI results from 10/14/2013. The MRI of the brain showed age related atrophy and minimal chronic buccal vascular ischemic change. MRI of the cervical spine showed multilevel mild degenerative changes with left paramedian disc herniation at C3-C4 and left disc protrusion at C4-C5 and C5-C6 and midline disc herniation at C6-C7. There was no report of nerve  root compression. MRI of the thoracic spine showed disc desiccation but no herniation or protrusions. MRI of the lumbar spine showed disc bulges at T12-L1 and L2-L3 and disc bulge with facet hypertrophy at L3-L4 and disc bulge with right foraminal annular tear at L4-L5.   REVIEW OF SYSTEMS: Constitutional: No fevers, chills, sweats, or change in appetite Eyes: No visual changes, double vision, eye pain Ear, nose and throat: No hearing loss, ear pain, nasal congestion, sore throat Cardiovascular: No chest pain, palpitations.   She has recently diagnose atrial fibrillation. Respiratory: No shortness of breath at rest or with exertion.   No wheezes GastrointestinaI: No nausea, vomiting, diarrhea, abdominal pain, fecal incontinence Genitourinary: No dysuria, urinary retention or frequency.  No nocturia. Musculoskeletal: No neck pain, back pain/thoracic pain as above Integumentary: No rash, pruritus, skin lesions Neurological: as above Psychiatric: No depression at this time.  No anxiety Endocrine: No palpitations, diaphoresis, change in appetite, change in weigh or increased thirst Hematologic/Lymphatic: No anemia, purpura, petechiae. Allergic/Immunologic: No itchy/runny eyes, nasal congestion, recent allergic reactions, rashes  ALLERGIES: Allergies  Allergen Reactions  . Statins Other (See Comments)    Intolerant all statins  . Sulfa Antibiotics Rash    HOME MEDICATIONS:  Current Outpatient Prescriptions:  .  Biotin 10000 MCG TABS, Take by mouth., Disp: , Rfl:  .  buPROPion (WELLBUTRIN SR) 150 MG 12 hr tablet, Take 1 tablet (150 mg total) by mouth  2 (two) times daily., Disp: 180 tablet, Rfl: 1 .  co-enzyme Q-10 30 MG capsule, Take 30 mg by mouth 3 (three) times daily., Disp: , Rfl:  .  docusate sodium (COLACE) 100 MG capsule, Take 100 mg by mouth daily., Disp: , Rfl:  .  estradiol (ESTRACE) 0.5 MG tablet, Take every other day then discontinue., Disp: 30 tablet, Rfl: 0 .  Multiple  Vitamin (MULTIVITAMIN) tablet, Take 1 tablet by mouth daily., Disp: , Rfl:  .  omega-3 acid ethyl esters (LOVAZA) 1 g capsule, Take 2 g by mouth 2 (two) times daily., Disp: , Rfl:  .  oxyCODONE-acetaminophen (ROXICET) 5-325 MG tablet, Take 1 tablet by mouth daily as needed for severe pain., Disp: 30 tablet, Rfl: 0 .  warfarin (COUMADIN) 5 MG tablet, Take 2 tablets daily or as directed, Disp: 60 tablet, Rfl: 3 .  tiZANidine (ZANAFLEX) 4 MG capsule, Take 1 capsule (4 mg total) by mouth 3 (three) times daily. (Patient not taking: Reported on 05/01/2017), Disp: 90 capsule, Rfl: 5 .  traMADol (ULTRAM) 50 MG tablet, Take 1 tablet (50 mg total) by mouth daily as needed., Disp: 30 tablet, Rfl: 0  PAST MEDICAL HISTORY: Past Medical History:  Diagnosis Date  . Adrenal adenoma   . Allergy   . Anemia   . Anxiety   . CAD (coronary artery disease)    50-60% mid LAD at cardiac catheterization 2014 [facility])  . Cataract   . Depression   . DJD (degenerative joint disease) of cervical spine 09/12/2016  . History of cardiomyopathy   . History of tobacco use   . Hyperlipidemia   . Neuromuscular disorder ([place])   . PAF (paroxysmal atrial fibrillation) [facility])    Diagnosis July 2017 - spontaneously resolved  . Statin intolerance     PAST SURGICAL HISTORY: Past Surgical History:  Procedure Laterality Date  . ABDOMINAL HYSTERECTOMY  1980   endometriosis  . TONSILLECTOMY  1954    FAMILY HISTORY: Family History  Problem Relation Age of Onset  . COPD Mother   . Hearing loss Mother   . Stroke Mother   . Heart disease Mother        chf, died at 37  . Arthritis Father   . Hypertension Father   . Heart disease Father        cad, pvd, died at 74  . Heart disease Sister        heart valve  . Arthritis Sister   . Hyperlipidemia Brother   . Hypertension Brother     SOCIAL HISTORY:  Social History   Social History  . Marital status: Married    Spouse name: Al  . Number of children: 0  .  Years of education: 36   Occupational History  . 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 Smoking status: Former Smoker    Packs/day: 1.00    Types: Cigarettes    Start date: 11/12/1965    Quit date: 09/06/2008  . Smokeless tobacco: Never Used  . Alcohol use 1.2 oz/week    2 Glasses of wine per week  . Drug use: No  . Sexual activity: Yes    Birth control/ protection: Post-menopausal   Other Topics Concern  . 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   General: The patient is well-developed and well-nourished and in no acute distress  Neck: The neck is supple, no carotid bruits are noted.  The neck is nontender.  Musculoskeletal:  Back is tender in the right lower thoracic intracostal region more than paraspinal region.  Neurologic Exam  Mental status: The patient is alert and oriented x 3 at the time of the examination. The patient has apparent normal recent and remote memory, with an apparently normal attention span and concentration ability.   Speech is normal.  Cranial nerves: Extraocular muscles are intact. Facial strength and sensation is normal. Trapezius and sternocleidomastoid strength is normal. No dysarthria is noted.  The tongue is midline, and the patient has symmetric elevation of the soft palate. No obvious hearing deficits are noted.  Motor:  Muscle bulk is normal.   Tone is normal. Strength is  5 / 5 in all 4 extremities.   Sensory: Sensory testing is intact to touch, temperature and vibration in the arms or legs.   Coordination: Cerebellar testing shows good finger-nose-finger.  Gait and station: Station is normal.   Gait is normal. Tandem gait is normal. Romberg is negative.    Reflexes: Deep tendon reflexes are symmetric and normal bilaterally.       DIAGNOSTIC DATA (LABS, IMAGING, TESTING) - I reviewed patient records, labs, notes, testing and imaging myself where available.      ASSESSMENT AND PLAN  Myofascial pain syndrome  Chronic right-sided thoracic back pain    1.  TPI with 80 mg Depo-Medrol in 3 cc lidocaine into the +/- T9T10 intracostal muscles where she is most tender.     I refilled her Percocet prescription (last one was 30 pills several months ago) and tramadol (also for just 30 pills).  2.    Tizanidine 2 mg when necessary for muscle spasms  3.   She will return to see me in 4 months or sooner if there are new or worsening neurologic symptoms.   Keishana Klinger A. Felecia Shelling, MD, PhD 7/71/1657, 90:38 AM Certified in Neurology, Clinical Neurophysiology, Sleep Medicine, Pain Medicine and Neuroimaging  [facility] [address] [place], [place] [address] [phone]

## 2017-05-10 ENCOUNTER — Telehealth: Payer: Self-pay | Admitting: Neurology

## 2017-05-10 NOTE — Telephone Encounter (Signed)
Heather [name] from [facility] calling NG:ITJLLVDIX (ULTRAM) 50 MG tablet, needing clarification on allowance

## 2017-05-13 NOTE — Telephone Encounter (Signed)
Spoke to [employer], Pharmacist questioning reason for tramadol and oxycodone prescription (due to new guidlelines).  I relayed she has chronic right sided thoracic back back.  She verbalized understanding and thanked me for calling.

## 2017-05-14 ENCOUNTER — Telehealth: Payer: Self-pay | Admitting: *Deleted

## 2017-05-14 ENCOUNTER — Ambulatory Visit (INDEPENDENT_AMBULATORY_CARE_PROVIDER_SITE_OTHER): Payer: Medicare Other | Admitting: *Deleted

## 2017-05-14 DIAGNOSIS — Z5181 Encounter for therapeutic drug level monitoring: Secondary | ICD-10-CM

## 2017-05-14 DIAGNOSIS — I480 Paroxysmal atrial fibrillation: Secondary | ICD-10-CM

## 2017-05-14 LAB — POCT INR: INR: 2.1

## 2017-05-14 NOTE — Telephone Encounter (Signed)
Done.  See coumadin note. 

## 2017-05-14 NOTE — Telephone Encounter (Signed)
INR - 2.1 / please call with instructions. / tg  °

## 2017-05-16 ENCOUNTER — Other Ambulatory Visit: Payer: Self-pay | Admitting: Cardiology

## 2017-05-20 ENCOUNTER — Ambulatory Visit (INDEPENDENT_AMBULATORY_CARE_PROVIDER_SITE_OTHER): Payer: Medicare Other | Admitting: *Deleted

## 2017-05-20 DIAGNOSIS — I25810 Atherosclerosis of coronary artery bypass graft(s) without angina pectoris: Secondary | ICD-10-CM | POA: Diagnosis not present

## 2017-05-20 DIAGNOSIS — Z5181 Encounter for therapeutic drug level monitoring: Secondary | ICD-10-CM

## 2017-05-20 DIAGNOSIS — I480 Paroxysmal atrial fibrillation: Secondary | ICD-10-CM

## 2017-05-20 LAB — POCT INR: INR: 2.8

## 2017-05-23 ENCOUNTER — Encounter: Payer: Self-pay | Admitting: Obstetrics & Gynecology

## 2017-05-23 ENCOUNTER — Ambulatory Visit (INDEPENDENT_AMBULATORY_CARE_PROVIDER_SITE_OTHER): Payer: Medicare Other | Admitting: Obstetrics & Gynecology

## 2017-05-23 VITALS — BP 112/64 | HR 58 | Ht 64.0 in | Wt 151.0 lb

## 2017-05-23 DIAGNOSIS — I25810 Atherosclerosis of coronary artery bypass graft(s) without angina pectoris: Secondary | ICD-10-CM

## 2017-05-23 DIAGNOSIS — Z79890 Hormone replacement therapy (postmenopausal): Secondary | ICD-10-CM | POA: Diagnosis not present

## 2017-05-23 DIAGNOSIS — N951 Menopausal and female climacteric states: Secondary | ICD-10-CM | POA: Diagnosis not present

## 2017-05-23 MED ORDER — ESTRADIOL 1 MG PO TABS: 1.0000 mg | ORAL_TABLET | Freq: Every day | ORAL | 11 refills | Status: DC

## 2017-05-23 NOTE — Progress Notes (Signed)
No chief complaint on file.   Blood pressure 112/64, pulse (!) 58, height 5\' 4"  (1.626 m), weight 151 lb (68.5 kg), last menstrual period 07/08/1979.  71 y.o. G1P0010 Patient's last menstrual period was 07/08/1979 (approximate). The current method of family planning is hysterectomy  Outpatient Encounter Prescriptions as of 05/23/2017  Medication Sig  . Biotin 10000 MCG TABS Take by mouth.  Marland Kitchen buPROPion (WELLBUTRIN SR) 150 MG 12 hr tablet Take 1 tablet (150 mg total) by mouth 2 (two) times daily.  Marland Kitchen co-enzyme Q-10 30 MG capsule Take 100 mg by mouth daily.   Marland Kitchen docusate sodium (COLACE) 100 MG capsule Take 100 mg by mouth daily.  Marland Kitchen estradiol (ESTRACE) 0.5 MG tablet Take every other day then discontinue.  . Multiple Vitamin (MULTIVITAMIN) tablet Take 1 tablet by mouth daily.  Marland Kitchen oxyCODONE-acetaminophen (ROXICET) 5-325 MG tablet Take 1 tablet by mouth daily as needed for severe pain.  Marland Kitchen warfarin (COUMADIN) 5 MG tablet TAKE TWO TABLETS BY MOUTH ONCE DAILY OR AS DIRECTED  . estradiol (ESTRACE) 1 MG tablet Take 1 tablet (1 mg total) by mouth daily.  Marland Kitchen omega-3 acid ethyl esters (LOVAZA) 1 g capsule Take 2 g by mouth 2 (two) times daily.  Marland Kitchen tiZANidine (ZANAFLEX) 4 MG capsule Take 1 capsule (4 mg total) by mouth 3 (three) times daily. (Patient not taking: Reported on 05/01/2017)  . traMADol (ULTRAM) 50 MG tablet Take 1 tablet (50 mg total) by mouth daily as needed. (Patient not taking: Reported on 05/23/2017)   No facility-administered encounter medications on file as of 05/23/2017.     Subjective Patient presented office as a consultation from her primary care provider who desires an OB/GYN to continue her on her long-standing estrogen replacement therapy I reviewed with Heather [name] evidence-based results we have for the last 10 or 20 years from multiple studies including the [facility] and nurse's health study as it relates to hormone replacement therapy and cardiovascular disease thromboembolic disease  and development of breast cancer She understands that long-term use greater than 20 years of estrogen alone probably does lead to a small increase in breast cancer but only an estrogen receptor positive breast cancer She does realize that she is at increased risk for thromboembolic disease as well and is on chronic Coumadin  Objective No exam  Pertinent ROS No burning with urination, frequency or urgency No nausea, vomiting or diarrhea Nor fever chills or other constitutional symptoms   Labs or studies Her mammogram from 02/13/2017 was reviewed and found to be normal    Impression Diagnoses this Encounter::   ICD-10-CM   1. Menopausal symptoms N95.1     Established relevant diagnosis(es):   Plan/Recommendations: Meds ordered this encounter  Medications  . estradiol (ESTRACE) 1 MG tablet    Sig: Take 1 tablet (1 mg total) by mouth daily.    Dispense:  30 tablet    Refill:  11    Labs or Scans Ordered: No orders of the defined types were placed in this encounter.   Management:: The patient is certainly aware of the increasing risk with age to the vascular system the presence of estrogen as well as the increased risk of breast cancer when the time an estrogen passes 20 years Despite both of the sacs the patient wants to continue on her Estrace 1 mg because she feels better thinks better and overall has greater vitality As a result she is continuing her Estrace 1 mg  Follow up Return  in about 1 year (around 05/23/2018) for Follow up, with Dr Elonda Husky.        Face to face time:  20 minutes  Greater than 50% of the visit time was spent in counseling and coordination of care with the patient.  The summary and outline of the counseling and care coordination is summarized in the note above.   All questions were answered.  Past Medical History:  Diagnosis Date  . Adrenal adenoma   . Allergy   . Anemia   . Anxiety   . CAD (coronary artery disease)    50-60% mid LAD  at cardiac catheterization 2014 [facility])  . Cataract   . Depression   . DJD (degenerative joint disease) of cervical spine 09/12/2016  . History of cardiomyopathy   . History of tobacco use   . Hyperlipidemia   . Neuromuscular disorder ([place])   . PAF (paroxysmal atrial fibrillation) [facility])    Diagnosis July 2017 - spontaneously resolved  . Statin intolerance     Past Surgical History:  Procedure Laterality Date  . ABDOMINAL HYSTERECTOMY  1980   endometriosis  . TONSILLECTOMY  1954    OB History    Gravida Para Term Preterm AB Living   1 0     1 0   SAB TAB Ectopic Multiple Live Births     1            Allergies  Allergen Reactions  . Statins Other (See Comments)    Intolerant all statins  . Sulfa Antibiotics Rash    Social History   Social History  . Marital status: Married    Spouse name: Al  . Number of children: 0  . Years of education: 34   Occupational History  . 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 Smoking status: Former Smoker    Packs/day: 0.00    Types: Cigarettes    Start date: 11/12/1965    Quit date: 09/06/2008  . Smokeless tobacco: Never Used  . Alcohol use Yes     Comment: occ  . Drug use: No     Comment: rarely  . Sexual activity: Yes    Birth control/ protection: Post-menopausal   Other Topics Concern  . 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 COPD Mother   . Hearing loss Mother   . Stroke Mother   . Heart disease Mother        chf, died at 62  . Arthritis Father   . Hypertension Father   . Heart disease Father        cad, pvd, died at 35  . Heart disease Sister        heart valve  . Arthritis Sister   . Hyperlipidemia Brother   . Hypertension Brother

## 2017-06-10 ENCOUNTER — Encounter: Payer: Self-pay | Admitting: Family Medicine

## 2017-06-10 DIAGNOSIS — K621 Rectal polyp: Secondary | ICD-10-CM | POA: Diagnosis not present

## 2017-06-10 DIAGNOSIS — K2950 Unspecified chronic gastritis without bleeding: Secondary | ICD-10-CM | POA: Diagnosis not present

## 2017-06-10 DIAGNOSIS — K6289 Other specified diseases of anus and rectum: Secondary | ICD-10-CM | POA: Diagnosis not present

## 2017-06-10 DIAGNOSIS — D126 Benign neoplasm of colon, unspecified: Secondary | ICD-10-CM | POA: Diagnosis not present

## 2017-06-10 DIAGNOSIS — R1310 Dysphagia, unspecified: Secondary | ICD-10-CM | POA: Diagnosis not present

## 2017-06-10 DIAGNOSIS — K5730 Diverticulosis of large intestine without perforation or abscess without bleeding: Secondary | ICD-10-CM | POA: Diagnosis not present

## 2017-06-10 DIAGNOSIS — Z86010 Personal history of colonic polyps: Secondary | ICD-10-CM | POA: Diagnosis not present

## 2017-06-10 DIAGNOSIS — B9681 Helicobacter pylori [H. pylori] as the cause of diseases classified elsewhere: Secondary | ICD-10-CM | POA: Diagnosis not present

## 2017-06-10 DIAGNOSIS — K2930 Chronic superficial gastritis without bleeding: Secondary | ICD-10-CM | POA: Diagnosis not present

## 2017-06-11 DIAGNOSIS — D126 Benign neoplasm of colon, unspecified: Secondary | ICD-10-CM | POA: Diagnosis not present

## 2017-06-11 DIAGNOSIS — B9681 Helicobacter pylori [H. pylori] as the cause of diseases classified elsewhere: Secondary | ICD-10-CM | POA: Diagnosis not present

## 2017-06-11 DIAGNOSIS — K621 Rectal polyp: Secondary | ICD-10-CM | POA: Diagnosis not present

## 2017-06-11 DIAGNOSIS — K2950 Unspecified chronic gastritis without bleeding: Secondary | ICD-10-CM | POA: Diagnosis not present

## 2017-06-17 ENCOUNTER — Ambulatory Visit (INDEPENDENT_AMBULATORY_CARE_PROVIDER_SITE_OTHER): Payer: Medicare Other | Admitting: *Deleted

## 2017-06-17 DIAGNOSIS — I25810 Atherosclerosis of coronary artery bypass graft(s) without angina pectoris: Secondary | ICD-10-CM | POA: Diagnosis not present

## 2017-06-17 DIAGNOSIS — I480 Paroxysmal atrial fibrillation: Secondary | ICD-10-CM | POA: Diagnosis not present

## 2017-06-17 DIAGNOSIS — Z5181 Encounter for therapeutic drug level monitoring: Secondary | ICD-10-CM | POA: Diagnosis not present

## 2017-06-17 LAB — POCT INR: INR: 1.8

## 2017-07-04 ENCOUNTER — Ambulatory Visit: Payer: Medicare Other | Admitting: Cardiology

## 2017-07-10 ENCOUNTER — Ambulatory Visit (INDEPENDENT_AMBULATORY_CARE_PROVIDER_SITE_OTHER): Payer: Medicare Other | Admitting: Cardiology

## 2017-07-10 ENCOUNTER — Encounter: Payer: Self-pay | Admitting: Cardiology

## 2017-07-10 ENCOUNTER — Ambulatory Visit (INDEPENDENT_AMBULATORY_CARE_PROVIDER_SITE_OTHER): Payer: Medicare Other | Admitting: *Deleted

## 2017-07-10 VITALS — BP 90/56 | HR 67 | Ht 64.0 in | Wt 152.0 lb

## 2017-07-10 DIAGNOSIS — Z5181 Encounter for therapeutic drug level monitoring: Secondary | ICD-10-CM

## 2017-07-10 DIAGNOSIS — I480 Paroxysmal atrial fibrillation: Secondary | ICD-10-CM

## 2017-07-10 DIAGNOSIS — I25810 Atherosclerosis of coronary artery bypass graft(s) without angina pectoris: Secondary | ICD-10-CM | POA: Diagnosis not present

## 2017-07-10 DIAGNOSIS — Z789 Other specified health status: Secondary | ICD-10-CM | POA: Diagnosis not present

## 2017-07-10 LAB — POCT INR: INR: 3.2

## 2017-07-10 NOTE — Progress Notes (Signed)
Cardiology Office Note  Date: 07/10/2017   ID: Heather [name], DOB [birth year], MRN [id number]  PCP: Raylene Everts, MD  Primary Cardiologist: Rozann Lesches, MD   Chief Complaint  Patient presents with  . PAF  . Coronary Artery Disease    History of Present Illness: Heather [name] is a 71 y.o. female last seen in the office back in April. She presents for a routine follow-up visit. She does not indicate any progressive palpitations or obvious breakthrough atrial fibrillation. No exertional chest pain. Continues to exercise regularly.  She continues to follow in the anticoagulation clinic on Coumadin. CHADVASC score is 3. She does not report any bleeding problems.   I reviewed her last lipid panel, she is following with Dr. Meda Coffee and has known statin intolerance. We have discussed potential evaluation in the lipid clinic but she has declined. We did discuss looking into whether she would have any type of coverage for Repatha.  Past Medical History:  Diagnosis Date  . Adrenal adenoma   . Allergy   . Anemia   . Anxiety   . CAD (coronary artery disease)    50-60% mid LAD at cardiac catheterization 2014 [facility])  . Cataract   . Depression   . DJD (degenerative joint disease) of cervical spine 09/12/2016  . History of cardiomyopathy   . History of tobacco use   . Hyperlipidemia   . Neuromuscular disorder ([place])   . PAF (paroxysmal atrial fibrillation) [facility])    Diagnosis July 2017 - spontaneously resolved  . Statin intolerance     Past Surgical History:  Procedure Laterality Date  . ABDOMINAL HYSTERECTOMY  1980   endometriosis  . TONSILLECTOMY  1954    Current Outpatient Prescriptions  Medication Sig Dispense Refill  . Biotin 10000 MCG TABS Take by mouth.    Marland Kitchen buPROPion (WELLBUTRIN SR) 150 MG 12 hr tablet Take 1 tablet (150 mg total) by mouth 2 (two) times daily. 180 tablet 1  . co-enzyme Q-10 30 MG capsule Take 100 mg by mouth daily.     Marland Kitchen docusate sodium  (COLACE) 100 MG capsule Take 100 mg by mouth daily.    Marland Kitchen estradiol (ESTRACE) 1 MG tablet Take 1 tablet (1 mg total) by mouth daily. 30 tablet 11  . Multiple Vitamin (MULTIVITAMIN) tablet Take 1 tablet by mouth daily.    Marland Kitchen omega-3 acid ethyl esters (LOVAZA) 1 g capsule Take 2 g by mouth 2 (two) times daily.    Marland Kitchen oxyCODONE-acetaminophen (ROXICET) 5-325 MG tablet Take 1 tablet by mouth daily as needed for severe pain. 30 tablet 0  . tiZANidine (ZANAFLEX) 4 MG capsule Take 1 capsule (4 mg total) by mouth 3 (three) times daily. 90 capsule 5  . traMADol (ULTRAM) 50 MG tablet Take 1 tablet (50 mg total) by mouth daily as needed. 30 tablet 0  . warfarin (COUMADIN) 5 MG tablet TAKE TWO TABLETS BY MOUTH ONCE DAILY OR AS DIRECTED 180 tablet 1   No current facility-administered medications for this visit.    Allergies:  Statins and Sulfa antibiotics   Social History: The patient  reports that she quit smoking about 8 years ago. Her smoking use included Cigarettes. She started smoking about 51 years ago. She smoked 0.00 packs per day. She has never used smokeless tobacco. She reports that she drinks alcohol. She reports that she does not use drugs.   ROS:  Please see the history of present illness. Otherwise, complete review of systems is positive for none.  All other systems are reviewed and negative.   Physical Exam: VS:  BP (!) 90/56 (BP Location: Right Arm)   Pulse 67   Ht 5\' 4"  (1.626 m)   Wt 152 lb (68.9 kg)   LMP 07/08/1979 (Approximate)   SpO2 98%   BMI 26.09 kg/m , BMI Body mass index is 26.09 kg/m.  Wt Readings from Last 3 Encounters:  07/10/17 152 lb (68.9 kg)  05/23/17 151 lb (68.5 kg)  05/01/17 153 lb (69.4 kg)    General: Patient appears comfortable at rest. HEENT: Conjunctiva and lids normal, oropharynx clear. Neck: Supple, no elevated JVP or carotid bruits, no thyromegaly. Lungs: Clear to auscultation, nonlabored breathing at rest. Cardiac: Regular rate and rhythm, no S3 or  significant systolic murmur, no pericardial rub. Abdomen: Soft, nontender, bowel sounds present, no guarding or rebound. Extremities: No pitting edema, distal pulses 2+. Skin: Warm and dry. Musculoskeletal: No kyphosis. Neuropsychiatric: Alert and oriented x3, affect grossly appropriate.  ECG: I personally reviewed the tracing from1/03/2017 which showed sinus bradycardia with nonspecific ST-T changes.  Recent Labwork:  October 2017: Cholesterol 385, triglycerides 78, HDL 81, LDL 288, ALT 14, AST 15, BUN 17, creatinine 0.81  Other Studies Reviewed Today:  Cardiac catheterization 03/06/2013 [facility]): Left main with mild luminal irregularities, 50-60% mid LAD stenosis spanning the origin of moderate caliber second diagonal branch which is larger in caliber than the distal LAD, left circumflex with mild luminal irregularities, RCA with mild luminal irregularities.  Echocardiogram 11/21/2016: Study Conclusions  - Left ventricle: The cavity size was normal. Systolic function was   normal. The estimated ejection fraction was in the range of 50%   to 55%. Doppler parameters are consistent with abnormal left   ventricular relaxation (grade 1 diastolic dysfunction). - Mitral valve: Calcified annulus. Normal thickness leaflets . - Left atrium: The atrium was mildly to moderately dilated. - Tricuspid valve: There was mild regurgitation.  Assessment and Plan:  1. Paroxysmal atrial fibrillation with CHADSVASC score of 3. No recurrent symptoms at this point. Plan to continue observation on Coumadin for stroke prophylaxis.  2. Nonobstructive CAD, moderate by prior assessment and without active angina symptoms.  3. Probable familial hyperlipidemia with known statin intolerance. Last LDL was 288. She has declined evaluation in the lipid clinic. We will check on whether she has any type of coverage for Repatha.  Current medicines were reviewed with the patient  today.  Disposition: Follow-up in 6 months.  Signed, Satira Sark, MD, [facility] 07/10/2017 2:52 PM    [facility] [address] [place], [place] [address] Phone: [phone]; Fax: [phone]

## 2017-07-10 NOTE — Patient Instructions (Signed)

## 2017-07-11 DIAGNOSIS — F411 Generalized anxiety disorder: Secondary | ICD-10-CM | POA: Diagnosis not present

## 2017-07-11 DIAGNOSIS — F6381 Intermittent explosive disorder: Secondary | ICD-10-CM | POA: Diagnosis not present

## 2017-07-17 ENCOUNTER — Ambulatory Visit (INDEPENDENT_AMBULATORY_CARE_PROVIDER_SITE_OTHER): Payer: Medicare Other | Admitting: *Deleted

## 2017-07-17 DIAGNOSIS — I480 Paroxysmal atrial fibrillation: Secondary | ICD-10-CM

## 2017-07-17 DIAGNOSIS — Z5181 Encounter for therapeutic drug level monitoring: Secondary | ICD-10-CM

## 2017-07-17 LAB — POCT INR: INR: 4

## 2017-07-18 ENCOUNTER — Telehealth: Payer: Self-pay

## 2017-07-18 NOTE — Telephone Encounter (Signed)
Called and spoke to Heather [name], stated she had a colonoscopy done, was dx with h pylori, has done tx, and they now feel she may have c diff, and they want to test her for that. Was asking if we could order here instead of [place]? Is going to send [place] records from [place] for you to review.

## 2017-07-18 NOTE — Telephone Encounter (Signed)
We can do here

## 2017-07-18 NOTE — Telephone Encounter (Signed)
Can you order a C-Dif for her to have done in [place]?  Please call

## 2017-07-19 ENCOUNTER — Other Ambulatory Visit: Payer: Self-pay | Admitting: Family Medicine

## 2017-07-19 ENCOUNTER — Encounter: Payer: Self-pay | Admitting: Family Medicine

## 2017-07-19 DIAGNOSIS — A048 Other specified bacterial intestinal infections: Secondary | ICD-10-CM

## 2017-07-19 DIAGNOSIS — D126 Benign neoplasm of colon, unspecified: Secondary | ICD-10-CM | POA: Insufficient documentation

## 2017-07-19 DIAGNOSIS — R197 Diarrhea, unspecified: Secondary | ICD-10-CM

## 2017-07-19 HISTORY — DX: Other specified bacterial intestinal infections: A04.8

## 2017-07-19 NOTE — Addendum Note (Signed)
Addended by: Ova Freshwater on: 07/19/2017 12:00 PM   Modules accepted: Orders

## 2017-07-24 DIAGNOSIS — F411 Generalized anxiety disorder: Secondary | ICD-10-CM | POA: Diagnosis not present

## 2017-07-24 DIAGNOSIS — F6381 Intermittent explosive disorder: Secondary | ICD-10-CM | POA: Diagnosis not present

## 2017-08-01 DIAGNOSIS — F411 Generalized anxiety disorder: Secondary | ICD-10-CM | POA: Diagnosis not present

## 2017-08-01 DIAGNOSIS — F6381 Intermittent explosive disorder: Secondary | ICD-10-CM | POA: Diagnosis not present

## 2017-08-05 ENCOUNTER — Ambulatory Visit (INDEPENDENT_AMBULATORY_CARE_PROVIDER_SITE_OTHER): Payer: Medicare Other | Admitting: *Deleted

## 2017-08-05 DIAGNOSIS — G458 Other transient cerebral ischemic attacks and related syndromes: Secondary | ICD-10-CM

## 2017-08-05 DIAGNOSIS — I480 Paroxysmal atrial fibrillation: Secondary | ICD-10-CM | POA: Diagnosis not present

## 2017-08-05 DIAGNOSIS — Z5181 Encounter for therapeutic drug level monitoring: Secondary | ICD-10-CM

## 2017-08-05 LAB — POCT INR: INR: 3.8

## 2017-08-13 NOTE — Addendum Note (Signed)
Addended by: Ova Freshwater on: 08/13/2017 10:07 AM   Modules accepted: Orders

## 2017-08-14 DIAGNOSIS — F6381 Intermittent explosive disorder: Secondary | ICD-10-CM | POA: Diagnosis not present

## 2017-08-14 DIAGNOSIS — F411 Generalized anxiety disorder: Secondary | ICD-10-CM | POA: Diagnosis not present

## 2017-08-19 ENCOUNTER — Ambulatory Visit (INDEPENDENT_AMBULATORY_CARE_PROVIDER_SITE_OTHER): Payer: Medicare Other | Admitting: *Deleted

## 2017-08-19 DIAGNOSIS — Z5181 Encounter for therapeutic drug level monitoring: Secondary | ICD-10-CM | POA: Diagnosis not present

## 2017-08-19 DIAGNOSIS — I480 Paroxysmal atrial fibrillation: Secondary | ICD-10-CM | POA: Diagnosis not present

## 2017-08-19 DIAGNOSIS — G459 Transient cerebral ischemic attack, unspecified: Secondary | ICD-10-CM

## 2017-08-19 LAB — POCT INR: INR: 2.3

## 2017-08-21 DIAGNOSIS — F411 Generalized anxiety disorder: Secondary | ICD-10-CM | POA: Diagnosis not present

## 2017-08-30 DIAGNOSIS — Z23 Encounter for immunization: Secondary | ICD-10-CM | POA: Diagnosis not present

## 2017-09-04 ENCOUNTER — Ambulatory Visit: Payer: Medicare Other | Admitting: Neurology

## 2017-09-04 DIAGNOSIS — F411 Generalized anxiety disorder: Secondary | ICD-10-CM | POA: Diagnosis not present

## 2017-09-17 ENCOUNTER — Ambulatory Visit (INDEPENDENT_AMBULATORY_CARE_PROVIDER_SITE_OTHER): Payer: Medicare Other | Admitting: *Deleted

## 2017-09-17 ENCOUNTER — Telehealth: Payer: Self-pay | Admitting: *Deleted

## 2017-09-17 DIAGNOSIS — Z5181 Encounter for therapeutic drug level monitoring: Secondary | ICD-10-CM

## 2017-09-17 DIAGNOSIS — I480 Paroxysmal atrial fibrillation: Secondary | ICD-10-CM

## 2017-09-17 LAB — POCT INR: INR: 4.2

## 2017-09-17 NOTE — Telephone Encounter (Signed)
Patient states that husband checked her INR and it was 4.2. Please call with instructions. /t g

## 2017-09-18 DIAGNOSIS — F411 Generalized anxiety disorder: Secondary | ICD-10-CM | POA: Diagnosis not present

## 2017-09-23 ENCOUNTER — Telehealth: Payer: Self-pay | Admitting: *Deleted

## 2017-09-23 NOTE — Telephone Encounter (Signed)
Spasms, and back pain per [place].

## 2017-09-23 NOTE — Telephone Encounter (Signed)
For what?

## 2017-09-23 NOTE — Telephone Encounter (Signed)
Patient called left a message stating the Heather [name] has an opening today but we will need to send a referral. Patient is requesting a referral to be done before 1 today. Please advise [phone]    I called and made patient aware that Dr Meda Coffee will have to approve this referral and once that is done we can call her to let her know and our referral coordinator will send it over.

## 2017-09-24 ENCOUNTER — Other Ambulatory Visit: Payer: Self-pay

## 2017-09-24 ENCOUNTER — Telehealth: Payer: Self-pay | Admitting: *Deleted

## 2017-09-24 ENCOUNTER — Encounter: Payer: Self-pay | Admitting: Family Medicine

## 2017-09-24 ENCOUNTER — Ambulatory Visit (INDEPENDENT_AMBULATORY_CARE_PROVIDER_SITE_OTHER): Payer: Medicare Other | Admitting: *Deleted

## 2017-09-24 ENCOUNTER — Ambulatory Visit (INDEPENDENT_AMBULATORY_CARE_PROVIDER_SITE_OTHER): Payer: Medicare Other | Admitting: Family Medicine

## 2017-09-24 VITALS — BP 118/76 | HR 64 | Temp 96.8°F | Resp 16 | Ht 64.0 in

## 2017-09-24 DIAGNOSIS — G8929 Other chronic pain: Secondary | ICD-10-CM | POA: Diagnosis not present

## 2017-09-24 DIAGNOSIS — I480 Paroxysmal atrial fibrillation: Secondary | ICD-10-CM

## 2017-09-24 DIAGNOSIS — A048 Other specified bacterial intestinal infections: Secondary | ICD-10-CM

## 2017-09-24 DIAGNOSIS — Z5181 Encounter for therapeutic drug level monitoring: Secondary | ICD-10-CM

## 2017-09-24 DIAGNOSIS — D126 Benign neoplasm of colon, unspecified: Secondary | ICD-10-CM

## 2017-09-24 DIAGNOSIS — M546 Pain in thoracic spine: Secondary | ICD-10-CM

## 2017-09-24 DIAGNOSIS — K5732 Diverticulitis of large intestine without perforation or abscess without bleeding: Secondary | ICD-10-CM

## 2017-09-24 LAB — POCT INR: INR: 2.5

## 2017-09-24 MED ORDER — BUPROPION HCL ER (SR) 150 MG PO TB12: 150.0000 mg | ORAL_TABLET | Freq: Two times a day (BID) | ORAL | 1 refills | Status: DC

## 2017-09-24 NOTE — Telephone Encounter (Signed)
noted 

## 2017-09-24 NOTE — Telephone Encounter (Signed)
Pt lvm stating her results today were 2.5

## 2017-09-24 NOTE — Progress Notes (Signed)
Chief Complaint  Patient presents with  . Abdominal Pain   Patient is here with 2 medical requests.  #1 she would like a referral for physical therapy.  She has chronic myofascial pain in thoracic region.  She has seen a neurologist.  She has had trigger point injections.  She is had medications.  She does get massage therapy periodically.  She would like to try physical therapy to see whether she can get some pain relief.  This is a chronic problem that waxes and wanes over time.  Today at its very painful for her.  No radiation of pain.  It is localized in the right rhomboid,/thoracic region. She also would like to be seen for abdominal pain.  She states that she had an upper and lower GI in July of this year.  She was found to have H. pylori gastritis.  She was unable to tolerate the 10 days of antibiotics.  She states that she was never prescribed a PPI.  After 6 days of antibiotics she had such diarrhea that she was advised by the doctor to quit.  She never did go for a test of cure.  The diarrhea cleared up and now she is back to her usual state of chronic constipation.  We discussed that she does have a diagnosis of diverticular disease.  She needs to be on a high-fiber diet.  She should take a soluble fiber supplement supplement in addition to drinking enough water, getting enough exercise.  She is eating [place] daily as a probiotic supplement.  Patient Active Problem List   Diagnosis Date Noted  . Tubular adenoma of colon 07/19/2017  . H. pylori infection 07/19/2017  . Thoracic back pain 02/08/2017  . Hx of colonic polyps 12/03/2016  . Family history of colon cancer 12/03/2016  . Encounter for therapeutic drug monitoring 11/21/2016  . CAD (coronary artery disease) 09/12/2016  . PAF (paroxysmal atrial fibrillation) ([place]) 09/12/2016  . Syncope 09/12/2016  . DJD (degenerative joint disease) of cervical spine 09/12/2016  . Statin intolerance 09/12/2016  . TIA (transient ischemic  attack) 09/12/2016  . Diverticulitis of colon 09/12/2016  . Myofascial pain syndrome 09/06/2016  . Takotsubo syndrome 09/06/2016  . HLD (hyperlipidemia) 09/06/2016  . Depression with anxiety 09/06/2016  . Recurrent UTI (urinary tract infection) 09/06/2016    Outpatient Encounter Medications as of 09/24/2017  Medication Sig  . Biotin 10000 MCG TABS Take by mouth.  Marland Kitchen buPROPion (WELLBUTRIN SR) 150 MG 12 hr tablet Take 1 tablet (150 mg total) 2 (two) times daily by mouth.  . co-enzyme Q-10 30 MG capsule Take 100 mg by mouth daily.   Marland Kitchen docusate sodium (COLACE) 100 MG capsule Take 100 mg by mouth daily.  Marland Kitchen estradiol (ESTRACE) 1 MG tablet Take 1 tablet (1 mg total) by mouth daily.  . Multiple Vitamin (MULTIVITAMIN) tablet Take 1 tablet by mouth daily.  Marland Kitchen omega-3 acid ethyl esters (LOVAZA) 1 g capsule Take 2 g by mouth 2 (two) times daily.  Marland Kitchen tiZANidine (ZANAFLEX) 4 MG capsule Take 1 capsule (4 mg total) by mouth 3 (three) times daily.  . traMADol (ULTRAM) 50 MG tablet Take 1 tablet (50 mg total) by mouth daily as needed.  . warfarin (COUMADIN) 5 MG tablet TAKE TWO TABLETS BY MOUTH ONCE DAILY OR AS DIRECTED   No facility-administered encounter medications on file as of 09/24/2017.     Allergies  Allergen Reactions  . Statins Other (See Comments)    Intolerant all statins  .  Sulfa Antibiotics Rash    Review of Systems  Constitutional: Negative for activity change, appetite change and fatigue.  HENT: Negative.  Negative for congestion and dental problem.   Eyes: Negative.  Negative for visual disturbance.  Respiratory: Negative.  Negative for cough and shortness of breath.   Cardiovascular: Negative.  Negative for chest pain, palpitations and leg swelling.  Gastrointestinal: Positive for abdominal distention, abdominal pain and constipation. Negative for blood in stool, diarrhea and nausea.  Genitourinary: Negative for urgency and vaginal bleeding.  Musculoskeletal: Positive for  myalgias.  Psychiatric/Behavioral: Negative for dysphoric mood and sleep disturbance.    BP 118/76 (BP Location: Left Arm, Patient Position: Sitting, Cuff Size: Normal)   Pulse 64   Temp (!) 96.8 F (36 C) (Temporal)   Resp 16   Ht 5\' 4"  (1.626 m)   LMP 07/08/1979 (Approximate)   SpO2 100%   BMI 26.09 kg/m   Physical Exam  Constitutional: She appears well-developed and well-nourished.  Uncomfortable in appearance  HENT:  Head: Normocephalic.  Mouth/Throat: Oropharynx is clear and moist.  Eyes: Pupils are equal, round, and reactive to light.  Neck: Normal range of motion. Neck supple.  Cardiovascular: Normal rate, regular rhythm and normal heart sounds.  Pulmonary/Chest: Effort normal and breath sounds normal.  Abdominal: Bowel sounds are normal. There is no hepatosplenomegaly. There is tenderness in the right lower quadrant and left lower quadrant.  Musculoskeletal: Normal range of motion. She exhibits no edema.       Back:    ASSESSMENT/PLAN:  1. H. pylori infection Needs test of cure - H. pylori breath test  2. Diverticulitis of colon Chronic lower abdominal pain.  Discussed diverticulitis diet  3. Tubular adenoma of colon Finding on colonoscopy.  Discussed colonoscopies every 5 years  4. Chronic right-sided thoracic back pain Chronic myofascial pain.  Will refer for physical therapy   Patient Instructions  Breath test for H pylori ordered  Physical therapy referral  Take the metamucil every day Push water     Raylene Everts, MD

## 2017-09-24 NOTE — Telephone Encounter (Signed)
Patient stated at check out that she forgot to tell Dr Meda Coffee she has been sick to her stomach the last couple days and she has had no appetite and she never not has an appetite.

## 2017-09-24 NOTE — Patient Instructions (Signed)
Breath test for H pylori ordered  Physical therapy referral  Take the metamucil every day Push water

## 2017-09-25 ENCOUNTER — Ambulatory Visit (HOSPITAL_COMMUNITY): Payer: Medicare Other | Attending: Family Medicine | Admitting: Physical Therapy

## 2017-09-25 ENCOUNTER — Other Ambulatory Visit: Payer: Self-pay

## 2017-09-25 ENCOUNTER — Encounter (HOSPITAL_COMMUNITY): Payer: Self-pay | Admitting: Physical Therapy

## 2017-09-25 DIAGNOSIS — A048 Other specified bacterial intestinal infections: Secondary | ICD-10-CM | POA: Diagnosis not present

## 2017-09-25 DIAGNOSIS — M546 Pain in thoracic spine: Secondary | ICD-10-CM | POA: Diagnosis not present

## 2017-09-25 DIAGNOSIS — R29898 Other symptoms and signs involving the musculoskeletal system: Secondary | ICD-10-CM

## 2017-09-25 DIAGNOSIS — M62830 Muscle spasm of back: Secondary | ICD-10-CM | POA: Insufficient documentation

## 2017-09-25 NOTE — Therapy (Addendum)
[place], [name], [address] Phone: [phone]   Fax:  [phone]  Physical Therapy Evaluation  Patient Details  Name: Heather [name] MRN: [id number] Date of Birth: 1946-04-07 Referring Provider: Raylene Everts, MD   Encounter Date: 09/25/2017  PT End of Session - 09/25/17 1213    Visit Number  1    Number of Visits  17    Date for PT Re-Evaluation  10/23/17    Authorization Type  Medicare Part A and B    Authorization Time Period  09/25/17-11/20/17    Authorization - Visit Number  1    Authorization - Number of Visits  10    PT Start Time  1120    PT Stop Time  1205    PT Time Calculation (min)  45 min    Activity Tolerance  Patient tolerated treatment well    Behavior During Therapy  [facility] for tasks assessed/performed       Past Medical History:  Diagnosis Date  . Adrenal adenoma   . Allergy   . Anemia   . Anxiety   . CAD (coronary artery disease)    50-60% mid LAD at cardiac catheterization 2014 [facility])  . Cataract   . Depression   . DJD (degenerative joint disease) of cervical spine 09/12/2016  . History of cardiomyopathy   . History of tobacco use   . Hyperlipidemia   . Neuromuscular disorder ([place])   . PAF (paroxysmal atrial fibrillation) [facility])    Diagnosis July 2017 - spontaneously resolved  . Statin intolerance     Past Surgical History:  Procedure Laterality Date  . ABDOMINAL HYSTERECTOMY  1980   endometriosis  . TONSILLECTOMY  1954    There were no vitals filed for this visit.   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  She states her pain is acute on chronic pain and that her episodes of increased pain last a long time. This recent episode has developed in the last couple of days and some episodes last up to  a couple weeks. She doesn't notice anything specific movmement bringing it on. She state she even planted a garden this summer and it didn't bother her. HEr pain never feels strong enough to send her to the ED and medication helps. 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  She believes stress may play a role in her pain and does state she has trouble using her right arm if pain is severe.    Pertinent History  Onset began approximately 6 years ago, no mechanism of injury. Pateint has had several cotisone pain shots in her back and relies on pain medication PRN as well as heat.    Limitations  Lifting;House hold activities;Writing    How long can you sit comfortably?  nothing, I'll put a bolster (yoga bolster with rectagle and zipper)    How long can you stand comfortably?  as long as I want    How long can you walk comfortably?  nothing    Diagnostic tests  years ago, MRI on entire spine    Patient Stated Goals  some way  to control pain so it doesn't interrupt my life.     Currently in Pain?  Yes    Pain Score  5     Pain Location  Back    Pain Orientation  Right    Pain Descriptors / Indicators  Sharp;Radiating;Burning    Pain Type  Chronic pain;Other (Comment) acute episode on chronic    Pain Radiating Towards  down right parspinals past lower trap    Pain Onset  More than a month ago 6 years    Pain Frequency  Intermittent will alleviate and remain gone for months then just pop up out of nowhere    Aggravating Factors   clicking a mouse, unsure about specific aggs    Pain Relieving Factors  heat helps, medications "tramadol"    Effect of Pain on Daily Activities  I can't chop veggies, cook, wash dishes, and don't like to drive when I have this much pain.         [facility] PT Assessment - 09/25/17 0001       Assessment   Medical Diagnosis  Chronic right-sided thoracic back pain    Referring Provider  Raylene Everts, MD    Onset Date/Surgical Date  09/26/11    Hand Dominance  Right    Prior Therapy  No has seen chiropractor previously      Precautions   Precautions  None      Restrictions   Weight Bearing Restrictions  No      Balance Screen   Has the patient fallen in the past 6 months  No    Has the patient had a decrease in activity level because of a fear of falling?   No    Is the patient reluctant to leave their home because of a fear of falling?   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     Leisure  Enjoys gardening and previusly did yoga, and she currently is taking ballroom dancing with her husband.      Cognition   Overall Cognitive Status  Within Functional Limits for tasks assessed      Observation/Other Assessments   Observations  Patient wearing brace/wrap for back pain under her clothing; states it applies pressure along her back that relieves some of her pain.      Sensation   Light Touch  Appears Intact      Posture/Postural Control   Posture/Postural Control  Postural limitations    Postural Limitations  Rounded Shoulders    Posture Comments  Patient has normal posture overall, patient with impaired scapular rhythm with righ tshoulder flexion      Tone   Assessment Location  Other (comment)      Tone Assessment - Other   Other Tone Location  Right paraspinal, QL, Rhomboids, infraspinatus    Other Tone Location Comments  Patient with increased tissue turgor along above stated muscles, increased tenderness to palpation along Right QL, C6-T4 paraspinals, and infraspinatus      ROM / Strength   AROM / PROM  / Strength  AROM;Strength      AROM   Overall AROM   Within functional limits for tasks performed  Overall AROM Comments  Cervical ROM, bilateral UE ROM all WNL's    Right/Left Shoulder  Right    Right Shoulder Flexion  -- increased pain at with resistance    Right Shoulder ABduction  -- increased pain at with resistance    Cervical Extension  -- increased pain at end range    Cervical - Left Side Bend  -- increased pain against resistance    Cervical - Right Rotation  -- increased pain at end range    Thoracic Flexion  25% limited    Thoracic Extension  50% limited    Thoracic - Right Side Bend  25% limited    Thoracic - Left Side Bend  25% limited    Thoracic - Right Rotation  50% limited    Thoracic - Left Rotation  50% limited      Strength   Strength Assessment Site  Hip;Knee;Shoulder    Right/Left Shoulder  Right    Right Shoulder Flexion  4+/5    Right Shoulder Extension  5/5    Right Shoulder ABduction  4+/5    Right/Left Hip  Right;Left    Right Hip Flexion  4/5    Right Hip Extension  4-/5    Right Hip ABduction  4-/5    Left Hip Flexion  4/5    Left Hip Extension  4-/5    Left Hip ABduction  4-/5    Right/Left Knee  Right;Left    Right Knee Flexion  5/5    Right Knee Extension  4+/5    Left Knee Flexion  5/5    Left Knee Extension  4+/5      Palpation   Spinal mobility  Hypomobile T1-T7    Palpation comment  Patient with tenderness to palpation along rihgt paraspinal, rhomboids, infraspinatous, and QL      Special Tests    Special Tests  Cervical    Cervical Tests  Spurling's;Dictraction      Spurling's   Findings  Negative    Side  Right    Comment  right/left negative      Distraction Test   Findngs  Positive    side  Right    Comment  Patient with mild pain alleviation along right paraspinala nd shoulder blade with manual distraction      other    Findings  Positive    Comment  Right/Left extension quadrants for lumbar/throacic spine pain  provocking        Objective measurements completed on examination: See above findings.    Treatment:  1 set 10 repetitions seated scapular retraction with shoulder external rotation (red theraband) 2x 30 seconds child's pose for thoracic opener   PT Education - 09/25/17 1210    Education provided  Yes    Education Details  Educated on HEP, educated on frequency and prognosis for therapy.    Person(s) Educated  Patient    Methods  Explanation    Comprehension  Verbalized understanding       PT Short Term Goals - 09/25/17 1245      PT SHORT TERM GOAL #1   Title  Patient will be independent with HEP and be able to demonstrate proper exercise technique.    Time  2    Period  Weeks    Status  New    Target Date  10/09/17      PT SHORT TERM GOAL #2   Title  Patient will have decreased maximum pain of 4/10 or less  thorughout daily activities to improve QOL.    Time  4    Period  Weeks    Status  New    Target Date  10/23/17      PT SHORT TERM GOAL #3   Title  Patient will be able to tolerate working at her computer for at least 30 minutes without and increase in pain to return to prior level of function with work.    Time  4    Period  Weeks    Status  New      PT SHORT TERM GOAL #4   Title  Patient will have improved MMT by 1/2 grade to progress strength for functinoal tasks/mobility.    Time  4    Period  Weeks    Status  New        PT Long Term Goals - 09/25/17 1251      PT LONG TERM GOAL #1   Title  Patient will have improved MMT by 1 grade to improve strenght for improved functional mobility.    Time  8    Period  Weeks    Status  New    Target Date  11/20/17      PT LONG TERM GOAL #2   Title  Patient will be able to tolerate working at computer or cooking for 30 minutes or greater to improve QOL and return to prior level of function.    Time  8    Period  Weeks    Status  New      PT LONG TERM GOAL #3   Title  Patient will have decreased maximum  pain of 3/10 or less throughout daily activities to improve QOL and regain indpendence with functinoal activities and ADL's.    Time  8    Period  Weeks    Status  New      PT LONG TERM GOAL #4   Title  Patient will be indpendent with HEP and be prepared to transition to regular fitness program for strenghtening and flexibility.    Time  8    Period  Weeks    Status  New           Plan - 09/25/17 1215    Clinical Impression Statement  Ms. Crocker presents with a 6 year history of chronic thoracic spine pain. She reports this a her most recent exacerbation of pain and that it is limiting her abilty to perform daily activities at home and perform her work responsibilties. She is currently functinoing below her baseline level of independent for all mobility and ADL's. Objective testing found the following impairments: hypomobility of the thoracic spine, muscular weakness, impaired postural control, pain, myofascial restrictions, muscle activation imabalances, and muscle spasms. She will benefit from skilled PT to address her current impairments and reduce pain to improve QOL and return to prior level of function.    Clinical Presentation  Stable    Clinical Presentation due to:  hypomobility of the thoracic spine, muscular weakness, impaired postural control, pain, myofascial restrictions, muscle activation imabalances, and muscle spasms    Clinical Decision Making  Low    Rehab Potential  Good    Clinical Impairments Affecting Rehab Potential  (+) motivated to participate int herapy, (+) active lifestyle, (-) increased stress at work    PT Frequency  2x / week    PT Duration  8 weeks    PT Treatment/Interventions  ADLs/Self Care Home Management;Dry needling;Manual techniques;Therapeutic activities;Therapeutic  exercise;Patient/family education;Moist Heat;Cryotherapy;Gait training;Stair training;Balance training;Neuromuscular re-education;Passive range of motion;Functional mobility training     PT Next Visit Plan  Review goals and initiate POC with patient. INitiate scpaular stability training for postural control, thoracic openers, hip strenghtening. Patient enjjoys yoga postural activities, and     PT Home Exercise Plan  Scarpular retraction with ER with red TB. Childs pose for thoracic spine stretch/opener.     Consulted and Agree with Plan of Care  Patient       Patient will benefit from skilled therapeutic intervention in order to improve the following deficits and impairments:  Improper body mechanics, Decreased mobility, Impaired tone, Increased muscle spasms, Hypomobility, Impaired flexibility, Increased fascial restricitons, Decreased strength, Postural dysfunction, Pain  Visit Diagnosis: Pain in thoracic spine - Plan: PT plan of care cert/re-cert  Muscle spasm of back - Plan: PT plan of care cert/re-cert  Other symptoms and signs involving the musculoskeletal system - Plan: PT plan of care cert/re-cert  G-Codes - 86/75/44 1309    Functional Assessment Tool Used (Outpatient Only)  Clinical Judgement    Functional Limitation  Other PT primary    Other PT Primary Current Status (B2010)  At least 20 percent but less than 40 percent impaired, limited or restricted    Other PT Primary Goal Status (O7121)  At least 1 percent but less than 20 percent impaired, limited or restricted        Problem List Patient Active Problem List   Diagnosis Date Noted  . Tubular adenoma of colon 07/19/2017  . H. pylori infection 07/19/2017  . Thoracic back pain 02/08/2017  . Hx of colonic polyps 12/03/2016  . Family history of colon cancer 12/03/2016  . Encounter for therapeutic drug monitoring 11/21/2016  . CAD (coronary artery disease) 09/12/2016  . PAF (paroxysmal atrial fibrillation) ([place]) 09/12/2016  . Syncope 09/12/2016  . DJD (degenerative joint disease) of cervical spine 09/12/2016  . Statin intolerance 09/12/2016  . TIA (transient ischemic attack) 09/12/2016  .  Diverticulitis of colon 09/12/2016  . Myofascial pain syndrome 09/06/2016  . Takotsubo syndrome 09/06/2016  . HLD (hyperlipidemia) 09/06/2016  . Depression with anxiety 09/06/2016  . Recurrent UTI (urinary tract infection) 09/06/2016    Debara Pickett, PT, DPT Physical Therapist with [facility]  09/25/2017 3:00 PM     [place], [name], [address] Phone: [phone]   Fax:  [phone]  Name: Misaki Sozio MRN: [id number] Date of Birth: 21-Jan-1946

## 2017-09-26 LAB — H. PYLORI BREATH TEST: H. pylori Breath Test: NOT DETECTED

## 2017-09-26 NOTE — Progress Notes (Signed)
Called Heather [name], aware of test results

## 2017-09-26 NOTE — Telephone Encounter (Signed)
Patient called stating she is returning selena's call.

## 2017-09-26 NOTE — Telephone Encounter (Signed)
Pt aware of results 

## 2017-09-27 ENCOUNTER — Encounter (HOSPITAL_COMMUNITY): Payer: Medicare Other

## 2017-10-01 ENCOUNTER — Other Ambulatory Visit: Payer: Self-pay

## 2017-10-01 ENCOUNTER — Encounter (HOSPITAL_COMMUNITY): Payer: Self-pay

## 2017-10-01 ENCOUNTER — Ambulatory Visit (HOSPITAL_COMMUNITY): Payer: Medicare Other

## 2017-10-01 DIAGNOSIS — M62830 Muscle spasm of back: Secondary | ICD-10-CM

## 2017-10-01 DIAGNOSIS — R29898 Other symptoms and signs involving the musculoskeletal system: Secondary | ICD-10-CM

## 2017-10-01 DIAGNOSIS — M546 Pain in thoracic spine: Secondary | ICD-10-CM

## 2017-10-01 NOTE — Patient Instructions (Signed)
   Rowing with Band 1-2 sets of 10 repetitions  Begin in standing with arms straight and theraband in each hand. Pull arms back so that forearms are parallel to the floor and shoulder blades are squeezing together. Slowly return to starting position and repeat.

## 2017-10-01 NOTE — Therapy (Signed)
[place], [name], [address] Phone: [phone]   Fax:  [phone]  Physical Therapy Treatment  Patient Details  Name: Heather [name] MRN: [id number] Date of Birth: August 13, 1946 Referring Provider: Raylene Everts, MD   Encounter Date: 10/01/2017  PT End of Session - 10/01/17 1351    Visit Number  2    Number of Visits  17    Date for PT Re-Evaluation  10/23/17    Authorization Type  Medicare Part A and B    Authorization Time Period  09/25/17-11/20/17    Authorization - Visit Number  2    Authorization - Number of Visits  10    PT Start Time  1300    PT Stop Time  1950    PT Time Calculation (min)  47 min    Activity Tolerance  Patient tolerated treatment well    Behavior During Therapy  [facility] for tasks assessed/performed       Past Medical History:  Diagnosis Date  . Adrenal adenoma   . Allergy   . Anemia   . Anxiety   . CAD (coronary artery disease)    50-60% mid LAD at cardiac catheterization 2014 [facility])  . Cataract   . Depression   . DJD (degenerative joint disease) of cervical spine 09/12/2016  . History of cardiomyopathy   . History of tobacco use   . Hyperlipidemia   . Neuromuscular disorder ([place])   . PAF (paroxysmal atrial fibrillation) [facility])    Diagnosis July 2017 - spontaneously resolved  . Statin intolerance     Past Surgical History:  Procedure Laterality Date  . ABDOMINAL HYSTERECTOMY  1980   endometriosis  . TONSILLECTOMY  1954    There were no vitals filed for this visit.  Subjective Assessment - 10/01/17 1302    Subjective  Patient had a good weekend and is on ly having  alittle pain today. She feels her exercises at home are already helping. After going dancing last week she states her right arm/shoulder/neck felt better and she believes it is related to the movement.     Pertinent History  Onset began approximately 6 years ago, no mechanism of injury. Pateint has had  several cotisone pain shots in her back and relies on pain medication PRN as well as heat.    Limitations  Lifting;House hold activities;Writing    How long can you sit comfortably?  nothing, I'll put a bolster (yoga bolster with rectagle and zipper)    How long can you stand comfortably?  as long as I want    How long can you walk comfortably?  nothing    Diagnostic tests  years ago, MRI on entire spine    Patient Stated Goals  some way to control pain so it doesn't interrupt my life.     Currently in Pain?  Yes    Pain Score  3     Pain Location  Back    Pain Orientation  Right    Pain Descriptors / Indicators  Aching    Pain Type  Chronic pain    Pain Onset  More than a month ago    Pain Frequency  Intermittent    Aggravating Factors   unsure    Pain Relieving Factors  midicine, exercises, heat    Effect of Pain on Daily Activities  if in pain she feels like she cannot do any of the daily life/work tasks she needs to  [facility] Adult PT Treatment/Exercise - 10/01/17 0001      Exercises   Exercises  Neck;Lumbar      Lumbar Exercises: Standing   Shoulder Extension  AROM;Strengthening;Both;10 reps;Theraband;Limitations    Theraband Level (Shoulder Extension)  Level 2 (Red)    Shoulder Extension Limitations  6" box step up with shoulder extension to facilitate       Lumbar Exercises: Supine   Bridge  10 reps;3 seconds    Bridge Limitations  SL bridge 8 reps BLE, second set 10 reps DL bridge      Lumbar Exercises: Sidelying   Other Sidelying Lumbar Exercises  10x each sideThoracic spine open book R/L side; verbal cues to relax c spine and let head rotate      Lumbar Exercises: Quadruped   Other Quadruped Lumbar Exercises  quardaped thoracic spine opener, 10x each side      Shoulder Exercises: Prone   Other Prone Exercises  --    Other Prone Exercises  childs pose for thoracic stretch      Shoulder Exercises: Sidelying   Other Sidelying Exercises  --      Shoulder  Exercises: Standing   Extension  Both;AROM;Strengthening;15 reps;Theraband;Limitations    Theraband Level (Shoulder Extension)  Level 2 (Red)    Row  AROM;Strengthening;Both;15 reps;Theraband;Limitations    Theraband Level (Shoulder Row)  Level 2 (Red)      Manual Therapy   Manual Therapy  Joint mobilization;Soft tissue mobilization    Manual therapy comments  completed seperate of other services    Joint Mobilization  "Elvy" technique: with right shoulder abducted ~ 45* and ER, lasteral glides C-spine to open on right; 3 x 30 seconds    Soft tissue mobilization  STW to upper trap, right paraspinals to address multiple trigger points       PT Education - 10/01/17 1441    Education provided  Yes    Education Details  Educated on anatomy and physiologic movements involved with mobilization and exercises, cues for proper form, updated and educated on improtance of HEP    Person(s) Educated  Patient    Methods  Explanation;Handout    Comprehension  Verbalized understanding;Returned demonstration       PT Short Term Goals - 09/25/17 1245      PT SHORT TERM GOAL #1   Title  Patient will be independent with HEP and be able to demonstrate proper exercise technique.    Time  2    Period  Weeks    Status  New    Target Date  10/09/17      PT SHORT TERM GOAL #2   Title  Patient will have decreased maximum pain of 4/10 or less thorughout daily activities to improve QOL.    Time  4    Period  Weeks    Status  New    Target Date  10/23/17      PT SHORT TERM GOAL #3   Title  Patient will be able to tolerate working at her computer for at least 30 minutes without and increase in pain to return to prior level of function with work.    Time  4    Period  Weeks    Status  New      PT SHORT TERM GOAL #4   Title  Patient will have improved MMT by 1/2 grade to progress strength for functinoal tasks/mobility.    Time  4    Period  Weeks    Status  New        PT Long Term Goals -  09/25/17 1251      PT LONG TERM GOAL #1   Title  Patient will have improved MMT by 1 grade to improve strenght for improved functional mobility.    Time  8    Period  Weeks    Status  New    Target Date  11/20/17      PT LONG TERM GOAL #2   Title  Patient will be able to tolerate working at computer or cooking for 30 minutes or greater to improve QOL and return to prior level of function.    Time  8    Period  Weeks    Status  New      PT LONG TERM GOAL #3   Title  Patient will have decreased maximum pain of 3/10 or less throughout daily activities to improve QOL and regain indpendence with functinoal activities and ADL's.    Time  8    Period  Weeks    Status  New      PT LONG TERM GOAL #4   Title  Patient will be indpendent with HEP and be prepared to transition to regular fitness program for strenghtening and flexibility.    Time  8    Period  Weeks    Status  New        Plan - 10/01/17 1442    Clinical Impression Statement  Ms. Cassel arrived for first session of therapy today. PT reveiwed goals with her and educated on benefits and reasoning for exercises throughout session. She had increase R UE symptoms with quadraped weight bearing activities, and decreased with thoracic openers and scapular stabilizers. She continues to be limited by myofascial restrictions in right paraspinal and upper trapezius and she has expressed interest in and may benefit from dry needling.     Rehab Potential  Good    Clinical Impairments Affecting Rehab Potential  (+) motivated to participate int herapy, (+) active lifestyle, (-) increased stress at work    PT Frequency  2x / week    PT Duration  8 weeks    PT Treatment/Interventions  ADLs/Self Care Home Management;Dry needling;Manual techniques;Therapeutic activities;Therapeutic exercise;Patient/family education;Moist Heat;Cryotherapy;Gait training;Stair training;Balance training;Neuromuscular re-education;Passive range of motion;Functional  mobility training    PT Next Visit Plan  Continue scapular stability training for postural control, thoracic openers, hip strenghtening. Patient enjoys yoga postural activities, and dancing. Incorporate STW for paraspinals and discuss with [name] potential for dry needling.    PT Home Exercise Plan  Scarpular retraction with ER with red TB. Childs pose for thoracic spine stretch/opener. Shoulder rows with red theraband in standing.    Consulted and Agree with Plan of Care  Patient       Patient will benefit from skilled therapeutic intervention in order to improve the following deficits and impairments:  Improper body mechanics, Decreased mobility, Impaired tone, Increased muscle spasms, Hypomobility, Impaired flexibility, Increased fascial restricitons, Decreased strength, Postural dysfunction, Pain  Visit Diagnosis: Pain in thoracic spine  Muscle spasm of back  Other symptoms and signs involving the musculoskeletal system     Problem List Patient Active Problem List   Diagnosis Date Noted  . Tubular adenoma of colon 07/19/2017  . H. pylori infection 07/19/2017  . Thoracic back pain 02/08/2017  . Hx of colonic polyps 12/03/2016  . Family history of colon cancer 12/03/2016  . Encounter for therapeutic drug monitoring 11/21/2016  . CAD (coronary  artery disease) 09/12/2016  . PAF (paroxysmal atrial fibrillation) ([place]) 09/12/2016  . Syncope 09/12/2016  . DJD (degenerative joint disease) of cervical spine 09/12/2016  . Statin intolerance 09/12/2016  . TIA (transient ischemic attack) 09/12/2016  . Diverticulitis of colon 09/12/2016  . Myofascial pain syndrome 09/06/2016  . Takotsubo syndrome 09/06/2016  . HLD (hyperlipidemia) 09/06/2016  . Depression with anxiety 09/06/2016  . Recurrent UTI (urinary tract infection) 09/06/2016    Debara Pickett, PT, DPT Physical Therapist with [facility]  10/01/2017 2:51 PM    [place], [name], [address] Phone: [phone]   Fax:  [phone]  Name: Heather [name] MRN: [id number] Date of Birth: 11/29/1945

## 2017-10-02 ENCOUNTER — Encounter (HOSPITAL_COMMUNITY): Payer: Self-pay

## 2017-10-02 ENCOUNTER — Ambulatory Visit (HOSPITAL_COMMUNITY): Payer: Medicare Other

## 2017-10-02 DIAGNOSIS — R29898 Other symptoms and signs involving the musculoskeletal system: Secondary | ICD-10-CM

## 2017-10-02 DIAGNOSIS — M62830 Muscle spasm of back: Secondary | ICD-10-CM | POA: Diagnosis not present

## 2017-10-02 DIAGNOSIS — M546 Pain in thoracic spine: Secondary | ICD-10-CM

## 2017-10-02 NOTE — Therapy (Signed)
[place], [name], [address] Phone: [phone]   Fax:  [phone]  Physical Therapy Treatment  Patient Details  Name: Heather [name] MRN: [id number] Date of Birth: 1946/10/16 Referring Provider: Raylene Everts, MD   Encounter Date: 10/02/2017  PT End of Session - 10/02/17 1605    Visit Number  3    Number of Visits  17    Date for PT Re-Evaluation  10/23/17    Authorization Type  Medicare Part A and B    Authorization Time Period  09/25/17-11/20/17    Authorization - Visit Number  3    Authorization - Number of Visits  10    PT Start Time  8413    PT Stop Time  1601    PT Time Calculation (min)  43 min    Activity Tolerance  Patient tolerated treatment well    Behavior During Therapy  [facility] for tasks assessed/performed       Past Medical History:  Diagnosis Date  . Adrenal adenoma   . Allergy   . Anemia   . Anxiety   . CAD (coronary artery disease)    50-60% mid LAD at cardiac catheterization 2014 [facility])  . Cataract   . Depression   . DJD (degenerative joint disease) of cervical spine 09/12/2016  . History of cardiomyopathy   . History of tobacco use   . Hyperlipidemia   . Neuromuscular disorder ([place])   . PAF (paroxysmal atrial fibrillation) [facility])    Diagnosis July 2017 - spontaneously resolved  . Statin intolerance     Past Surgical History:  Procedure Laterality Date  . ABDOMINAL HYSTERECTOMY  1980   endometriosis  . TONSILLECTOMY  1954    There were no vitals filed for this visit.  Subjective Assessment - 10/02/17 1522    Subjective  Pt stated she has done alot of cleaning for the holidays with increased pain with repetitive movements on Rt side thoracic exercises, pain scale 6/10.  Interested in dry needling.    Pertinent History  Onset began approximately 6 years ago, no mechanism of injury. Pateint has had several cotisone pain shots in her back and relies on pain medication  PRN as well as heat.    Patient Stated Goals  some way to control pain so it doesn't interrupt my life.     Currently in Pain?  Yes    Pain Score  6     Pain Location  Back    Pain Orientation  Right;Mid    Pain Descriptors / Indicators  Sharp    Pain Type  Chronic pain    Pain Onset  More than a month ago    Pain Frequency  Intermittent    Aggravating Factors   unsure    Pain Relieving Factors  medicine, exercises, heat    Effect of Pain on Daily Activities  if in pain she feels like she cannot do any of the daily life/ work tasks she needs to                      [facility] Adult PT Treatment/Exercise - 10/02/17 0001      Posture/Postural Control   Posture/Postural Control  Postural limitations    Postural Limitations  Rounded Shoulders      Exercises   Exercises  Neck;Lumbar      Lumbar Exercises: Standing   Shoulder Extension Limitations  resume next session  Lumbar Exercises: Sidelying   Other Sidelying Lumbar Exercises  10x each sideThoracic spine open book R/L side; verbal cues to relax c spine and let head rotate      Lumbar Exercises: Quadruped   Other Quadruped Lumbar Exercises  quardaped thoracic spine opener, 10x each side      Shoulder Exercises: Seated   Other Seated Exercises  3D thoracic excursion      Shoulder Exercises: Prone   Retraction  15 reps scapular retraction    Extension  15 reps    Other Prone Exercises  childs pose for thoracic stretch center, Lt and Rt      Manual Therapy   Manual Therapy  Soft tissue mobilization    Manual therapy comments  completed seperate of other services    Soft tissue mobilization  STM to Rt paraspinals, upper traps and periscapular mm in prone positon             PT Education - 10/01/17 1441    Education provided  Yes    Education Details  Educated on anatomy and physiologic movements involved with mobilization and exercises, cues for proper form, updated and educated on improtance of HEP     Person(s) Educated  Patient    Methods  Explanation;Handout    Comprehension  Verbalized understanding;Returned demonstration       PT Short Term Goals - 09/25/17 1245      PT SHORT TERM GOAL #1   Title  Patient will be independent with HEP and be able to demonstrate proper exercise technique.    Time  2    Period  Weeks    Status  New    Target Date  10/09/17      PT SHORT TERM GOAL #2   Title  Patient will have decreased maximum pain of 4/10 or less thorughout daily activities to improve QOL.    Time  4    Period  Weeks    Status  New    Target Date  10/23/17      PT SHORT TERM GOAL #3   Title  Patient will be able to tolerate working at her computer for at least 30 minutes without and increase in pain to return to prior level of function with work.    Time  4    Period  Weeks    Status  New      PT SHORT TERM GOAL #4   Title  Patient will have improved MMT by 1/2 grade to progress strength for functinoal tasks/mobility.    Time  4    Period  Weeks    Status  New        PT Long Term Goals - 09/25/17 1251      PT LONG TERM GOAL #1   Title  Patient will have improved MMT by 1 grade to improve strenght for improved functional mobility.    Time  8    Period  Weeks    Status  New    Target Date  11/20/17      PT LONG TERM GOAL #2   Title  Patient will be able to tolerate working at computer or cooking for 30 minutes or greater to improve QOL and return to prior level of function.    Time  8    Period  Weeks    Status  New      PT LONG TERM GOAL #3   Title  Patient will have decreased maximum pain  of 3/10 or less throughout daily activities to improve QOL and regain indpendence with functinoal activities and ADL's.    Time  8    Period  Weeks    Status  New      PT LONG TERM GOAL #4   Title  Patient will be indpendent with HEP and be prepared to transition to regular fitness program for strenghtening and flexibility.    Time  8    Period  Weeks    Status  New             Plan - 10/02/17 1606    Clinical Impression Statement  Session focus with thoracic mobility, posture strengthening and manual techniques for pain control.  Added 3D thoracic excursion for mobility and prone postural strengthening.  Pt with moderate spasms Rt paraspinals, periscapular musculature and upper trapezius, able to reduce but unable to fully resolve.  Pt reports decrease in pain from 6/10 to 3/10.  Pt expressed interest in dry needling, changed apt next session for dry needling specialist to see if appropriate and would benefit.    Rehab Potential  Good    Clinical Impairments Affecting Rehab Potential  (+) motivated to participate int herapy, (+) active lifestyle, (-) increased stress at work    PT Frequency  2x / week    PT Duration  8 weeks    PT Treatment/Interventions  ADLs/Self Care Home Management;Dry needling;Manual techniques;Therapeutic activities;Therapeutic exercise;Patient/family education;Moist Heat;Cryotherapy;Gait training;Stair training;Balance training;Neuromuscular re-education;Passive range of motion;Functional mobility training    PT Next Visit Plan  Continue scapular stability training for postural control, thoracic openers, hip strenghtening. Patient enjoys yoga postural activities, and dancing. Incorporate STW for paraspinals and discuss with [name] potential for dry needling.    PT Home Exercise Plan  Scarpular retraction with ER with red TB. Childs pose for thoracic spine stretch/opener. Shoulder rows with red theraband in standing.       Patient will benefit from skilled therapeutic intervention in order to improve the following deficits and impairments:  Improper body mechanics, Decreased mobility, Impaired tone, Increased muscle spasms, Hypomobility, Impaired flexibility, Increased fascial restricitons, Decreased strength, Postural dysfunction, Pain  Visit Diagnosis: Pain in thoracic spine  Muscle spasm of back  Other symptoms and signs  involving the musculoskeletal system     Problem List Patient Active Problem List   Diagnosis Date Noted  . Tubular adenoma of colon 07/19/2017  . H. pylori infection 07/19/2017  . Thoracic back pain 02/08/2017  . Hx of colonic polyps 12/03/2016  . Family history of colon cancer 12/03/2016  . Encounter for therapeutic drug monitoring 11/21/2016  . CAD (coronary artery disease) 09/12/2016  . PAF (paroxysmal atrial fibrillation) ([place]) 09/12/2016  . Syncope 09/12/2016  . DJD (degenerative joint disease) of cervical spine 09/12/2016  . Statin intolerance 09/12/2016  . TIA (transient ischemic attack) 09/12/2016  . Diverticulitis of colon 09/12/2016  . Myofascial pain syndrome 09/06/2016  . Takotsubo syndrome 09/06/2016  . HLD (hyperlipidemia) 09/06/2016  . Depression with anxiety 09/06/2016  . Recurrent UTI (urinary tract infection) 09/06/2016   Ihor Austin, [place]; [place]  Aldona Lento 10/02/2017, 4:20 PM  [place], [name], [address] Phone: [phone]   Fax:  [phone]  Name: Heather [name] MRN: [id number] Date of Birth: Oct 20, 1946

## 2017-10-07 ENCOUNTER — Other Ambulatory Visit: Payer: Self-pay

## 2017-10-07 ENCOUNTER — Ambulatory Visit (HOSPITAL_COMMUNITY): Payer: Medicare Other

## 2017-10-07 ENCOUNTER — Telehealth: Payer: Self-pay

## 2017-10-07 DIAGNOSIS — R29898 Other symptoms and signs involving the musculoskeletal system: Secondary | ICD-10-CM

## 2017-10-07 DIAGNOSIS — M62830 Muscle spasm of back: Secondary | ICD-10-CM | POA: Diagnosis not present

## 2017-10-07 DIAGNOSIS — M546 Pain in thoracic spine: Secondary | ICD-10-CM

## 2017-10-07 NOTE — Therapy (Signed)
[place], [name], [address] Phone: [phone]   Fax:  [phone]  Physical Therapy Treatment  Patient Details  Name: Heather [name] MRN: [id number] Date of Birth: 08-10-1946 Referring Provider: Raylene Everts, MD   Encounter Date: 10/07/2017  PT End of Session - 10/07/17 1630    Visit Number  4    Number of Visits  17    Date for PT Re-Evaluation  10/23/17    Authorization Type  Medicare Part A and B    Authorization Time Period  09/25/17-11/20/17    Authorization - Visit Number  4    Authorization - Number of Visits  10    PT Start Time  1524    PT Stop Time  1612    PT Time Calculation (min)  48 min    Activity Tolerance  Patient tolerated treatment well    Behavior During Therapy  [facility] for tasks assessed/performed       Past Medical History:  Diagnosis Date  . Adrenal adenoma   . Allergy   . Anemia   . Anxiety   . CAD (coronary artery disease)    50-60% mid LAD at cardiac catheterization 2014 [facility])  . Cataract   . Depression   . DJD (degenerative joint disease) of cervical spine 09/12/2016  . History of cardiomyopathy   . History of tobacco use   . Hyperlipidemia   . Neuromuscular disorder ([place])   . PAF (paroxysmal atrial fibrillation) [facility])    Diagnosis July 2017 - spontaneously resolved  . Statin intolerance     Past Surgical History:  Procedure Laterality Date  . ABDOMINAL HYSTERECTOMY  1980   endometriosis  . TONSILLECTOMY  1954    There were no vitals filed for this visit.  Subjective Assessment - 10/07/17 1528    Subjective  Pt reports she has has an incrased flare of pain in the last 2-3 days, some help with HEP adn heat, but mostly pretty bad.     Pertinent History  Onset began approximately 6 years ago, no mechanism of injury. Pateint has had several cotisone pain shots in her back and relies on pain medication PRN as well as heat.    Currently in Pain?  Yes    Pain  Score  7                       [facility] Adult PT Treatment/Exercise - 10/07/17 0001      Manual Therapy   Manual Therapy  Soft tissue mobilization;Myofascial release    Myofascial Release  All on Right side: Lower trap, longissimus, multifidus, rhomboids, middle trap, infraspinatus       Trigger Point Dry Needling - 10/07/17 1621    Consent Given?  Yes    Education Handout Provided  Yes    Muscles Treated Upper Body  Infraspinatus;Longissimus;Upper trapezius;Levator scapulae    Upper Trapezius Response  Twitch reponse elicited;Palpable increased muscle length also lowee traps, middle traps, rhomboids     Levator Scapulae Response  Twitch response elicited;Palpable increased muscle length    Infraspinatus Response  Twitch response elicited;Palpable increased muscle length    Longissimus Response  Twitch response elicited;Palpable increased muscle length T5-T8; Also multifidus             PT Short Term Goals - 09/25/17 1245      PT SHORT TERM GOAL #1   Title  Patient will be independent with HEP and  be able to demonstrate proper exercise technique.    Time  2    Period  Weeks    Status  New    Target Date  10/09/17      PT SHORT TERM GOAL #2   Title  Patient will have decreased maximum pain of 4/10 or less thorughout daily activities to improve QOL.    Time  4    Period  Weeks    Status  New    Target Date  10/23/17      PT SHORT TERM GOAL #3   Title  Patient will be able to tolerate working at her computer for at least 30 minutes without and increase in pain to return to prior level of function with work.    Time  4    Period  Weeks    Status  New      PT SHORT TERM GOAL #4   Title  Patient will have improved MMT by 1/2 grade to progress strength for functinoal tasks/mobility.    Time  4    Period  Weeks    Status  New        PT Long Term Goals - 09/25/17 1251      PT LONG TERM GOAL #1   Title  Patient will have improved MMT by 1 grade to  improve strenght for improved functional mobility.    Time  8    Period  Weeks    Status  New    Target Date  11/20/17      PT LONG TERM GOAL #2   Title  Patient will be able to tolerate working at computer or cooking for 30 minutes or greater to improve QOL and return to prior level of function.    Time  8    Period  Weeks    Status  New      PT LONG TERM GOAL #3   Title  Patient will have decreased maximum pain of 3/10 or less throughout daily activities to improve QOL and regain indpendence with functinoal activities and ADL's.    Time  8    Period  Weeks    Status  New      PT LONG TERM GOAL #4   Title  Patient will be indpendent with HEP and be prepared to transition to regular fitness program for strenghtening and flexibility.    Time  8    Period  Weeks    Status  New            Plan - 10/07/17 1631    Clinical Impression Statement  Pt reporting exacerbation fo symptoms over last seevral days without clear reason. Session focus on trial of diagnosit ctrigger point dry needling and supportive myofascial release. The right posterior scapulothoracic chain is dense with trigger points and taut tissue bands, many of which are hypersensitive to deep palpation and pressure, starting in the tghoracic longissimus, lower/middle traps, rhomboids, and moving to levator scap, and infraspinatus. Pt seems most responsive to symptomatic reproduction with lateral costal attachment of ilicostalism as well as lower traps. Infraspinsatus seems most sensitive, but no apparent weakness or pain with rotator cuff testing is detected. Curious for pt report of past referral into the axilla, which warrants further screening of serratus anterior and subscapularis. At end of session patient reports reduction in overall clenching sensation in back muscles which is replaces with low level soreness. No reproduction of pain with rib springing or mobilization of thoracic  spine.     Rehab Potential  Good     Clinical Impairments Affecting Rehab Potential  (+) motivated to participate int herapy, (+) active lifestyle, (-) increased stress at work    PT Frequency  2x / week    PT Duration  8 weeks    PT Treatment/Interventions  ADLs/Self Care Home Management;Dry needling;Manual techniques;Therapeutic activities;Therapeutic exercise;Patient/family education;Moist Heat;Cryotherapy;Gait training;Stair training;Balance training;Neuromuscular re-education;Passive range of motion;Functional mobility training    PT Next Visit Plan  FU feedback on dry needling and MFR last session; screen serratus anterior function and strength, as well as subscapularis. Continue scapular stability training for postural control, thoracic openers, hip strenghtening. Patient enjoys yoga postural activities, and dancing. Incorporate STW for paraspinals.    PT Home Exercise Plan  Scarpular retraction with ER with red TB. Childs pose for thoracic spine stretch/opener. Shoulder rows with red theraband in standing.    Consulted and Agree with Plan of Care  Patient       Patient will benefit from skilled therapeutic intervention in order to improve the following deficits and impairments:  Improper body mechanics, Decreased mobility, Impaired tone, Increased muscle spasms, Hypomobility, Impaired flexibility, Increased fascial restricitons, Decreased strength, Postural dysfunction, Pain  Visit Diagnosis: Pain in thoracic spine  Muscle spasm of back  Other symptoms and signs involving the musculoskeletal system     Problem List Patient Active Problem List   Diagnosis Date Noted  . Tubular adenoma of colon 07/19/2017  . H. pylori infection 07/19/2017  . Thoracic back pain 02/08/2017  . Hx of colonic polyps 12/03/2016  . Family history of colon cancer 12/03/2016  . Encounter for therapeutic drug monitoring 11/21/2016  . CAD (coronary artery disease) 09/12/2016  . PAF (paroxysmal atrial fibrillation) ([place]) 09/12/2016  . Syncope  09/12/2016  . DJD (degenerative joint disease) of cervical spine 09/12/2016  . Statin intolerance 09/12/2016  . TIA (transient ischemic attack) 09/12/2016  . Diverticulitis of colon 09/12/2016  . Myofascial pain syndrome 09/06/2016  . Takotsubo syndrome 09/06/2016  . HLD (hyperlipidemia) 09/06/2016  . Depression with anxiety 09/06/2016  . Recurrent UTI (urinary tract infection) 09/06/2016   4:39 PM, 10/07/17 Etta Grandchild, PT, DPT Physical Therapist - [place] [phone] (Office)    Etta Grandchild 10/07/2017, 4:39 PM  [place] [address] [place], [name], [address] Phone: [phone]   Fax:  [phone]  Name: Heather [name] MRN: [id number] Date of Birth: August 20, 1946

## 2017-10-07 NOTE — Telephone Encounter (Signed)
-----   Message from Garald Balding, [place] sent at 10/07/2017  2:13 PM EST ----- Sign or remove all pended procedures. This encounter contains procedures and/or medications that do not have an associated diagnosis.  Please associate these orders before closing this encounter. Sign or remove all pended procedures.  Can ya'll close this so I can close my open encounters, my last day is Friday and I need to have my box cleaned out before going to my next office on monday

## 2017-10-07 NOTE — Telephone Encounter (Signed)
Error

## 2017-10-09 ENCOUNTER — Encounter (HOSPITAL_COMMUNITY): Payer: Self-pay

## 2017-10-09 ENCOUNTER — Ambulatory Visit (HOSPITAL_COMMUNITY): Payer: Medicare Other

## 2017-10-09 DIAGNOSIS — M62830 Muscle spasm of back: Secondary | ICD-10-CM | POA: Diagnosis not present

## 2017-10-09 DIAGNOSIS — M546 Pain in thoracic spine: Secondary | ICD-10-CM | POA: Diagnosis not present

## 2017-10-09 DIAGNOSIS — R29898 Other symptoms and signs involving the musculoskeletal system: Secondary | ICD-10-CM | POA: Diagnosis not present

## 2017-10-09 NOTE — Therapy (Signed)
[place], [name], [address] Phone: [phone]   Fax:  [phone]  Physical Therapy Treatment  Patient Details  Name: Heather [name] MRN: [id number] Date of Birth: Dec 09, 1945 Referring Provider: Raylene Everts, MD   Encounter Date: 10/09/2017  PT End of Session - 10/09/17 1345    Visit Number  5    Number of Visits  17    Date for PT Re-Evaluation  10/23/17    Authorization Type  Medicare Part A and B    Authorization Time Period  09/25/17-11/20/17    Authorization - Visit Number  5    Authorization - Number of Visits  10    PT Start Time  1300    PT Stop Time  1343    PT Time Calculation (min)  43 min    Activity Tolerance  Patient tolerated treatment well    Behavior During Therapy  [facility] for tasks assessed/performed       Past Medical History:  Diagnosis Date  . Adrenal adenoma   . Allergy   . Anemia   . Anxiety   . CAD (coronary artery disease)    50-60% mid LAD at cardiac catheterization 2014 [facility])  . Cataract   . Depression   . DJD (degenerative joint disease) of cervical spine 09/12/2016  . History of cardiomyopathy   . History of tobacco use   . Hyperlipidemia   . Neuromuscular disorder ([place])   . PAF (paroxysmal atrial fibrillation) [facility])    Diagnosis July 2017 - spontaneously resolved  . Statin intolerance     Past Surgical History:  Procedure Laterality Date  . ABDOMINAL HYSTERECTOMY  1980   endometriosis  . TONSILLECTOMY  1954    There were no vitals filed for this visit.  Subjective Assessment - 10/09/17 1303    Subjective  Pt reports that she is in 3-4/10 pain today but has been feeling better in the mornings. She reports taking 3-4 advils with her percuset and she feels pretty good. She reports band exercises are causing some irritation now.     Currently in Pain?  Yes    Pain Score  3     Pain Location  Back    Pain Orientation  Right    Pain Descriptors /  Indicators  Aching                      [facility] Adult PT Treatment/Exercise - 10/09/17 0001      Neck Exercises: Seated   Neck Retraction  10 reps    X to V  10 reps    W Back  10 reps      Lumbar Exercises: Standing   Push / Pull Sled  lateral 6" step up with light resistance on purple band x 10 for trunk rotation strenghtening     Shoulder Extension  AROM;Strengthening;Both;10 reps;Theraband;Limitations    Theraband Level (Shoulder Extension)  Level 2 (Red)    Shoulder Extension Limitations  6" box step up, shoulder extension    Other Standing Lumbar Exercises  wall slides x 10 c lift off; UE flexion with towel roll x 10 Bilat    Other Standing Lumbar Exercises  Hip 3 way on airex x 10 bilat      Lumbar Exercises: Quadruped   Other Quadruped Lumbar Exercises  quardaped thoracic spine opener, 10x each side      Manual Therapy   Manual Therapy  Soft tissue mobilization;Myofascial  release    Manual therapy comments  completed seperate of other services    Myofascial Release  All on Right side: Lower trap, longissimus, multifidus, rhomboids, middle trap, infraspinatus             PT Education - 10/09/17 1448    Education provided  Yes    Education Details  Educated on anatomy and explaining tension in Rt side while completing Lt UE motions secondary to cross muscle activation of thoracic spine. Pt educated on benefits of multiple treatments of dry needling to contniue progress, however, wants to continue regular follow up visits.     Person(s) Educated  Patient    Methods  Explanation    Comprehension  Verbalized understanding       PT Short Term Goals - 09/25/17 1245      PT SHORT TERM GOAL #1   Title  Patient will be independent with HEP and be able to demonstrate proper exercise technique.    Time  2    Period  Weeks    Status  New    Target Date  10/09/17      PT SHORT TERM GOAL #2   Title  Patient will have decreased maximum pain of 4/10 or less  thorughout daily activities to improve QOL.    Time  4    Period  Weeks    Status  New    Target Date  10/23/17      PT SHORT TERM GOAL #3   Title  Patient will be able to tolerate working at her computer for at least 30 minutes without and increase in pain to return to prior level of function with work.    Time  4    Period  Weeks    Status  New      PT SHORT TERM GOAL #4   Title  Patient will have improved MMT by 1/2 grade to progress strength for functinoal tasks/mobility.    Time  4    Period  Weeks    Status  New        PT Long Term Goals - 09/25/17 1251      PT LONG TERM GOAL #1   Title  Patient will have improved MMT by 1 grade to improve strenght for improved functional mobility.    Time  8    Period  Weeks    Status  New    Target Date  11/20/17      PT LONG TERM GOAL #2   Title  Patient will be able to tolerate working at computer or cooking for 30 minutes or greater to improve QOL and return to prior level of function.    Time  8    Period  Weeks    Status  New      PT LONG TERM GOAL #3   Title  Patient will have decreased maximum pain of 3/10 or less throughout daily activities to improve QOL and regain indpendence with functinoal activities and ADL's.    Time  8    Period  Weeks    Status  New      PT LONG TERM GOAL #4   Title  Patient will be indpendent with HEP and be prepared to transition to regular fitness program for strenghtening and flexibility.    Time  8    Period  Weeks    Status  New            Plan -  10/09/17 1449    Clinical Impression Statement  Today's session involved education on thoracic mobilization and review of side-lying thoracic rotation exercise. Patient continues to have muscle restrictions noted in thoracic region inferior medial of scapular border. She denies any increase in pain during this session with added exercises, however, she had increased difficulty with theraband core strengthening functional exercises. Pt  wishes to complete 1 more week of visits before trial of dry needling again. She notes that it was a good experience and she has had relief for the past couple days, however, wishes to trial just exercises. Pt educated on need for multiple session to continue progressing further with results.     Rehab Potential  Good    Clinical Impairments Affecting Rehab Potential  (+) motivated to participate int herapy, (+) active lifestyle, (-) increased stress at work    PT Frequency  2x / week    PT Duration  8 weeks    PT Treatment/Interventions  ADLs/Self Care Home Management;Dry needling;Manual techniques;Therapeutic activities;Therapeutic exercise;Patient/family education;Moist Heat;Cryotherapy;Gait training;Stair training;Balance training;Neuromuscular re-education;Passive range of motion;Functional mobility training    PT Next Visit Plan  Continue further screen serratus anterior function and strength, as well as subscapularis. Continue scapular stability training for postural control, thoracic openers, hip strenghtening. Patient enjoys yoga postural activities, and dancing. Incorporate STW for paraspinals.    PT Home Exercise Plan  Scarpular retraction with ER with red TB. Childs pose for thoracic spine stretch/opener. Shoulder rows with red theraband in standing.    Consulted and Agree with Plan of Care  Patient       Patient will benefit from skilled therapeutic intervention in order to improve the following deficits and impairments:  Improper body mechanics, Decreased mobility, Impaired tone, Increased muscle spasms, Hypomobility, Impaired flexibility, Increased fascial restricitons, Decreased strength, Postural dysfunction, Pain  Visit Diagnosis: Pain in thoracic spine  Muscle spasm of back  Other symptoms and signs involving the musculoskeletal system     Problem List Patient Active Problem List   Diagnosis Date Noted  . Tubular adenoma of colon 07/19/2017  . H. pylori infection  07/19/2017  . Thoracic back pain 02/08/2017  . Hx of colonic polyps 12/03/2016  . Family history of colon cancer 12/03/2016  . Encounter for therapeutic drug monitoring 11/21/2016  . CAD (coronary artery disease) 09/12/2016  . PAF (paroxysmal atrial fibrillation) ([place]) 09/12/2016  . Syncope 09/12/2016  . DJD (degenerative joint disease) of cervical spine 09/12/2016  . Statin intolerance 09/12/2016  . TIA (transient ischemic attack) 09/12/2016  . Diverticulitis of colon 09/12/2016  . Myofascial pain syndrome 09/06/2016  . Takotsubo syndrome 09/06/2016  . HLD (hyperlipidemia) 09/06/2016  . Depression with anxiety 09/06/2016  . Recurrent UTI (urinary tract infection) 09/06/2016   Starr Lake PT, DPT 2:52 PM, 10/09/17 [place], [name], [address] Phone: [phone]   Fax:  [phone]  Name: Heather [name] MRN: [id number] Date of Birth: 1946-05-27

## 2017-10-14 ENCOUNTER — Other Ambulatory Visit: Payer: Self-pay

## 2017-10-14 ENCOUNTER — Encounter (HOSPITAL_COMMUNITY): Payer: Self-pay

## 2017-10-14 ENCOUNTER — Ambulatory Visit (HOSPITAL_COMMUNITY): Payer: Medicare Other | Attending: Family Medicine

## 2017-10-14 ENCOUNTER — Ambulatory Visit (INDEPENDENT_AMBULATORY_CARE_PROVIDER_SITE_OTHER): Payer: Medicare Other | Admitting: *Deleted

## 2017-10-14 DIAGNOSIS — Z5181 Encounter for therapeutic drug level monitoring: Secondary | ICD-10-CM | POA: Diagnosis not present

## 2017-10-14 DIAGNOSIS — R29898 Other symptoms and signs involving the musculoskeletal system: Secondary | ICD-10-CM | POA: Insufficient documentation

## 2017-10-14 DIAGNOSIS — M62830 Muscle spasm of back: Secondary | ICD-10-CM

## 2017-10-14 DIAGNOSIS — M546 Pain in thoracic spine: Secondary | ICD-10-CM | POA: Insufficient documentation

## 2017-10-14 DIAGNOSIS — I480 Paroxysmal atrial fibrillation: Secondary | ICD-10-CM

## 2017-10-14 LAB — POCT INR: INR: 3

## 2017-10-14 NOTE — Therapy (Signed)
[place], [name], [address] Phone: [phone]   Fax:  [phone]  Physical Therapy Treatment  Patient Details  Name: Heather [name] MRN: [id number] Date of Birth: 1946/10/03 Referring Provider: Raylene Everts, MD   Encounter Date: 10/14/2017  PT End of Session - 10/14/17 1517    Visit Number  6    Number of Visits  17    Date for PT Re-Evaluation  10/23/17    Authorization Type  Medicare Part A and B    Authorization Time Period  09/25/17-11/20/17    Authorization - Visit Number  6    Authorization - Number of Visits  10    PT Start Time  1324    PT Stop Time  1517    PT Time Calculation (min)  39 min    Activity Tolerance  Patient tolerated treatment well    Behavior During Therapy  [facility] for tasks assessed/performed       Past Medical History:  Diagnosis Date  . Adrenal adenoma   . Allergy   . Anemia   . Anxiety   . CAD (coronary artery disease)    50-60% mid LAD at cardiac catheterization 2014 [facility])  . Cataract   . Depression   . DJD (degenerative joint disease) of cervical spine 09/12/2016  . History of cardiomyopathy   . History of tobacco use   . Hyperlipidemia   . Neuromuscular disorder ([place])   . PAF (paroxysmal atrial fibrillation) [facility])    Diagnosis July 2017 - spontaneously resolved  . Statin intolerance     Past Surgical History:  Procedure Laterality Date  . ABDOMINAL HYSTERECTOMY  1980   endometriosis  . TONSILLECTOMY  1954    There were no vitals filed for this visit.  Subjective Assessment - 10/14/17 1441    Subjective  Patient reports she had a busy weekend; she was supposed to go to a dance event but was unable to go because of her husband's band practice. She started singing with her husband's jazz band recently, and is nervous about performing for an audience. 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 She also reports she  has been participating in her HEP but has stopped all of the theraband exercises because it was starting to irritate her back more.    Pertinent History  Onset began approximately 6 years ago, no mechanism of injury. Pateint has had several cotisone pain shots in her back and relies on pain medication PRN as well as heat.    Patient Stated Goals  some way to control pain so it doesn't interrupt my life.     Currently in Pain?  Yes    Pain Score  2     Pain Location  Back    Pain Orientation  Right    Pain Descriptors / Indicators  Aching;Sharp pinpoint spot at inferomedial angle of right scapula    Pain Type  Chronic pain    Pain Onset  More than a month ago    Pain Frequency  Intermittent    Aggravating Factors   theraband exercises    Pain Relieving Factors  medicine, heat, child's pose    Effect of Pain on Daily Activities  not limited too badly, just pianful durign activities      [facility] Adult PT Treatment/Exercise - 10/14/17 0001      Neck Exercises: Machines for Strengthening   UBE (  Upper Arm Bike)  5 minutes on level 2      Lumbar Exercises: Standing   Other Standing Lumbar Exercises  step ups on 6" step with wall slide/lift off; 15 x BLE     Lumbar Exercises: Prone   Other Prone Lumbar Exercises  childs pose; 2x 30 seconds for pain relief       Lumbar Exercises: Quadruped   Other Quadruped Lumbar Exercises  quardaped thoracic spine opener, 10x each side      Shoulder Exercises: Standing   Other Standing Exercises  resisted thoracic rotation with BUE forward punch, 1.5 plates for L side,  1 plate R side secondary to pain provocation with more resistance     Manual Therapy   Manual Therapy  Soft tissue mobilization;Myofascial release    Manual therapy comments  completed seperate of other services    Myofascial Release  Right side: Upper Trap, parasinals, rhomboids infraspinatus       PT Education - 10/14/17 1525    Education provided  Yes    Education Details  Educated  on progression of tolerance into spinal quadrants as pain decreases. Reviewed difference between muscle soreness and pian referral from joint or muscle. Reveiwed improtance of theraband exercises for strengthening and posture, educated on gradually introducing row back into program every otehr day.    Person(s) Educated  Patient    Methods  Explanation    Comprehension  Verbalized understanding       PT Short Term Goals - 09/25/17 1245      PT SHORT TERM GOAL #1   Title  Patient will be independent with HEP and be able to demonstrate proper exercise technique.    Time  2    Period  Weeks    Status  New    Target Date  10/09/17      PT SHORT TERM GOAL #2   Title  Patient will have decreased maximum pain of 4/10 or less thorughout daily activities to improve QOL.    Time  4    Period  Weeks    Status  New    Target Date  10/23/17      PT SHORT TERM GOAL #3   Title  Patient will be able to tolerate working at her computer for at least 30 minutes without and increase in pain to return to prior level of function with work.    Time  4    Period  Weeks    Status  New      PT SHORT TERM GOAL #4   Title  Patient will have improved MMT by 1/2 grade to progress strength for functinoal tasks/mobility.    Time  4    Period  Weeks    Status  New        PT Long Term Goals - 09/25/17 1251      PT LONG TERM GOAL #1   Title  Patient will have improved MMT by 1 grade to improve strenght for improved functional mobility.    Time  8    Period  Weeks    Status  New    Target Date  11/20/17      PT LONG TERM GOAL #2   Title  Patient will be able to tolerate working at computer or cooking for 30 minutes or greater to improve QOL and return to prior level of function.    Time  8    Period  Weeks    Status  New      PT LONG TERM GOAL #3   Title  Patient will have decreased maximum pain of 3/10 or less throughout daily activities to improve QOL and regain indpendence with functinoal  activities and ADL's.    Time  8    Period  Weeks    Status  New      PT LONG TERM GOAL #4   Title  Patient will be indpendent with HEP and be prepared to transition to regular fitness program for strenghtening and flexibility.    Time  8    Period  Weeks    Status  New        Plan - 10/14/17 1527    Clinical Impression Statement  Patient is progressing gradually in therapy. She continues to have chronic pain in her upper right back with myofascial restrictions in her right paraspinals, upper trap, and infraspinatus. She continues to benefit from thoracic/spinal openers for pain relief. Patient is interested in continuing with dry needling in ~1 week as she feels like it benefited her after ~ 2-3 days post intervention. Patient will benefit from ongoing skilled PT to address impairments to progress towards goals and decrease pain.    Rehab Potential  Good    Clinical Impairments Affecting Rehab Potential  (+) motivated to participate int herapy, (+) active lifestyle, (-) increased stress at work    PT Frequency  2x / week    PT Duration  8 weeks    PT Treatment/Interventions  ADLs/Self Care Home Management;Dry needling;Manual techniques;Therapeutic activities;Therapeutic exercise;Patient/family education;Moist Heat;Cryotherapy;Gait training;Stair training;Balance training;Neuromuscular re-education;Passive range of motion;Functional mobility training    PT Next Visit Plan  Begin with heat and UBE to warm up before STW for righ upper back. Continue scapular stability training for postural control, thoracic openers, hip strenghtening. Patient enjoys yoga postural activities, and dancing. Incorporate STW for paraspinals.    PT Home Exercise Plan  Scarpular retraction with ER with red TB. Childs pose for thoracic spine stretch/opener. Shoulder rows with red theraband in standing.    Consulted and Agree with Plan of Care  Patient       Patient will benefit from skilled therapeutic intervention  in order to improve the following deficits and impairments:  Improper body mechanics, Decreased mobility, Impaired tone, Increased muscle spasms, Hypomobility, Impaired flexibility, Increased fascial restricitons, Decreased strength, Postural dysfunction, Pain  Visit Diagnosis: Pain in thoracic spine  Muscle spasm of back  Other symptoms and signs involving the musculoskeletal system     Problem List Patient Active Problem List   Diagnosis Date Noted  . Tubular adenoma of colon 07/19/2017  . H. pylori infection 07/19/2017  . Thoracic back pain 02/08/2017  . Hx of colonic polyps 12/03/2016  . Family history of colon cancer 12/03/2016  . Encounter for therapeutic drug monitoring 11/21/2016  . CAD (coronary artery disease) 09/12/2016  . PAF (paroxysmal atrial fibrillation) ([place]) 09/12/2016  . Syncope 09/12/2016  . DJD (degenerative joint disease) of cervical spine 09/12/2016  . Statin intolerance 09/12/2016  . TIA (transient ischemic attack) 09/12/2016  . Diverticulitis of colon 09/12/2016  . Myofascial pain syndrome 09/06/2016  . Takotsubo syndrome 09/06/2016  . HLD (hyperlipidemia) 09/06/2016  . Depression with anxiety 09/06/2016  . Recurrent UTI (urinary tract infection) 09/06/2016    Debara Pickett, PT, DPT Physical Therapist with [facility]  10/14/2017 4:20 PM    [place], [name], [address] Phone: [phone]  Fax:  [phone]  Name: Jaslen Adcox MRN: [id number] Date of Birth: 1946/07/07

## 2017-10-16 ENCOUNTER — Other Ambulatory Visit: Payer: Self-pay

## 2017-10-16 ENCOUNTER — Ambulatory Visit (HOSPITAL_COMMUNITY): Payer: Medicare Other

## 2017-10-16 ENCOUNTER — Encounter (HOSPITAL_COMMUNITY): Payer: Self-pay

## 2017-10-16 DIAGNOSIS — R29898 Other symptoms and signs involving the musculoskeletal system: Secondary | ICD-10-CM | POA: Diagnosis not present

## 2017-10-16 DIAGNOSIS — M546 Pain in thoracic spine: Secondary | ICD-10-CM

## 2017-10-16 DIAGNOSIS — M62830 Muscle spasm of back: Secondary | ICD-10-CM

## 2017-10-16 NOTE — Therapy (Signed)
[place], [name], [address] Phone: [phone]   Fax:  [phone]  Physical Therapy Treatment  Patient Details  Name: Heather [name] MRN: [id number] Date of Birth: 1946/08/17 Referring Provider: Raylene Everts, MD   Encounter Date: 10/16/2017  PT End of Session - 10/16/17 1515    Visit Number  7    Number of Visits  17    Date for PT Re-Evaluation  10/23/17    Authorization Type  Medicare Part A and B    Authorization Time Period  09/25/17-11/20/17    Authorization - Visit Number  7    Authorization - Number of Visits  10    PT Start Time  1962 patient arrived late    PT Stop Time  1348    PT Time Calculation (min)  42 min    Activity Tolerance  Patient tolerated treatment well    Behavior During Therapy  [facility] for tasks assessed/performed       Past Medical History:  Diagnosis Date  . Adrenal adenoma   . Allergy   . Anemia   . Anxiety   . CAD (coronary artery disease)    50-60% mid LAD at cardiac catheterization 2014 [facility])  . Cataract   . Depression   . DJD (degenerative joint disease) of cervical spine 09/12/2016  . History of cardiomyopathy   . History of tobacco use   . Hyperlipidemia   . Neuromuscular disorder ([place])   . PAF (paroxysmal atrial fibrillation) [facility])    Diagnosis July 2017 - spontaneously resolved  . Statin intolerance     Past Surgical History:  Procedure Laterality Date  . ABDOMINAL HYSTERECTOMY  1980   endometriosis  . TONSILLECTOMY  1954    There were no vitals filed for this visit.  Subjective Assessment - 10/16/17 1308    Subjective  Patient reports she is feeling good today. She chopped vegetables yesterday for a stew and said her "wing", indicating inferior medial angle of her scapula was irritated. She states during this her pain came on quickly and reached a maximum of 4/10. She states she did her theraband rows before bed last night and felt fine when she  woke up this morning. She feels with all the improvement she has made she may not need to do another session of dry needling.    Pertinent History  Onset began approximately 6 years ago, no mechanism of injury. Pateint has had several cotisone pain shots in her back and relies on pain medication PRN as well as heat.    Patient Stated Goals  some way to control pain so it doesn't interrupt my life.     Currently in Pain?  Yes    Pain Score  2     Pain Location  Back    Pain Orientation  Right    Pain Descriptors / Indicators  Aching    Pain Type  Chronic pain    Pain Onset  More than a month ago    Pain Frequency  Intermittent    Aggravating Factors   chopping vegetables    Pain Relieving Factors  child's pose and heat    Effect of Pain on Daily Activities  not too badley, just painful with certain activities       [facility] Adult PT Treatment/Exercise - 10/16/17 0001      Neck Exercises: Machines for Strengthening   UBE (Upper Arm Bike)  5 minutes on level 1  Neck Exercises: Theraband   Rows  --      Neck Exercises: Standing   Upper Extremity D1  Extension;20 reps;Theraband    Theraband Level (UE D1)  Level 2 (Red)    Upper Extremity D2  20 reps;Theraband;Extension    Theraband Level (UE D2)  Level 2 (Red)      Lumbar Exercises: Prone   Other Prone Lumbar Exercises  childs pose; 5 reps, walking hands latearlly with 10 second hold at each position      Shoulder Exercises: Supine   Other Supine Exercises  serratus anterior punch for form, 5 repetitions BUE, no wieght; patient had excellent form and will initiate at home as part of HEP      Shoulder Exercises: Standing   Row  AROM;Strengthening;Both;Theraband;Limitations;20 reps    Theraband Level (Shoulder Row)  Level 2 (Red)    Other Standing Exercises  --    Other Standing Exercises  resisted thoracic rotation with BUE, foward punch, 1 plate both sides      Manual Therapy   Manual Therapy  Myofascial release;Joint  mobilization    Manual therapy comments  completed seperate of other services    Joint Mobilization  grade III/IV PA's T1-9, 2x 45 seconds    Myofascial Release  myofascial release to latisimus dorsi on right, 3 trigger points       PT Education - 10/16/17 1514    Education provided  Yes    Education Details  Educated on purpose of each intervention and proper form for exercises. Updated HEP.    Person(s) Educated  Patient    Methods  Explanation;Handout    Comprehension  Verbalized understanding       PT Short Term Goals - 09/25/17 1245      PT SHORT TERM GOAL #1   Title  Patient will be independent with HEP and be able to demonstrate proper exercise technique.    Time  2    Period  Weeks    Status  New    Target Date  10/09/17      PT SHORT TERM GOAL #2   Title  Patient will have decreased maximum pain of 4/10 or less thorughout daily activities to improve QOL.    Time  4    Period  Weeks    Status  New    Target Date  10/23/17      PT SHORT TERM GOAL #3   Title  Patient will be able to tolerate working at her computer for at least 30 minutes without and increase in pain to return to prior level of function with work.    Time  4    Period  Weeks    Status  New      PT SHORT TERM GOAL #4   Title  Patient will have improved MMT by 1/2 grade to progress strength for functinoal tasks/mobility.    Time  4    Period  Weeks    Status  New        PT Long Term Goals - 09/25/17 1251      PT LONG TERM GOAL #1   Title  Patient will have improved MMT by 1 grade to improve strenght for improved functional mobility.    Time  8    Period  Weeks    Status  New    Target Date  11/20/17      PT LONG TERM GOAL #2   Title  Patient will be able  to tolerate working at computer or cooking for 30 minutes or greater to improve QOL and return to prior level of function.    Time  8    Period  Weeks    Status  New      PT LONG TERM GOAL #3   Title  Patient will have decreased  maximum pain of 3/10 or less throughout daily activities to improve QOL and regain indpendence with functinoal activities and ADL's.    Time  8    Period  Weeks    Status  New      PT LONG TERM GOAL #4   Title  Patient will be indpendent with HEP and be prepared to transition to regular fitness program for strenghtening and flexibility.    Time  8    Period  Weeks    Status  New        Plan - 10/16/17 1516    Clinical Impression Statement  Patient is progressing gradually in therapy. She arrived today with reduced pain and feels she had less pain this overall since her last visit. She continues to have tenderness along her right infraspinatus and her latissimus dorsi. She appears to have a positive response to thoracic/spinal openers for pain relief and thoracic mobilization. She no longer wishes to pursue more dry needling as she believes the exercises are helping her enough. She will continue to benefit from ongoing skilled PT to address impairments to progress towards goals and decrease pain.    Rehab Potential  Good    Clinical Impairments Affecting Rehab Potential  (+) motivated to participate int herapy, (+) active lifestyle, (-) increased stress at work    PT Frequency  2x / week    PT Duration  8 weeks    PT Treatment/Interventions  ADLs/Self Care Home Management;Dry needling;Manual techniques;Therapeutic activities;Therapeutic exercise;Patient/family education;Moist Heat;Cryotherapy;Gait training;Stair training;Balance training;Neuromuscular re-education;Passive range of motion;Functional mobility training    PT Next Visit Plan  Continue UBE to warm up before STW for righ lat dorsi. Continue scapular stability training for postural control, thoracic openers, hip strenghtening. Continue to advance exercises     PT Home Exercise Plan  Scapular retraction with ER with red TB. Childs pose for thoracic spine stretch/opener. Shoulder rows with red theraband in standing. 12/5 - serratus punch     Consulted and Agree with Plan of Care  Patient       Patient will benefit from skilled therapeutic intervention in order to improve the following deficits and impairments:  Improper body mechanics, Decreased mobility, Impaired tone, Increased muscle spasms, Hypomobility, Impaired flexibility, Increased fascial restricitons, Decreased strength, Postural dysfunction, Pain  Visit Diagnosis: Pain in thoracic spine  Muscle spasm of back  Other symptoms and signs involving the musculoskeletal system     Problem List Patient Active Problem List   Diagnosis Date Noted  . Tubular adenoma of colon 07/19/2017  . H. pylori infection 07/19/2017  . Thoracic back pain 02/08/2017  . Hx of colonic polyps 12/03/2016  . Family history of colon cancer 12/03/2016  . Encounter for therapeutic drug monitoring 11/21/2016  . CAD (coronary artery disease) 09/12/2016  . PAF (paroxysmal atrial fibrillation) ([place]) 09/12/2016  . Syncope 09/12/2016  . DJD (degenerative joint disease) of cervical spine 09/12/2016  . Statin intolerance 09/12/2016  . TIA (transient ischemic attack) 09/12/2016  . Diverticulitis of colon 09/12/2016  . Myofascial pain syndrome 09/06/2016  . Takotsubo syndrome 09/06/2016  . HLD (hyperlipidemia) 09/06/2016  . Depression with anxiety 09/06/2016  .  Recurrent UTI (urinary tract infection) 09/06/2016    Debara Pickett, PT, DPT Physical Therapist with [facility]  10/16/2017 4:35 PM    [place], [name], [address] Phone: [phone]   Fax:  [phone]  Name: Heather [name] MRN: [id number] Date of Birth: July 10, 1946

## 2017-10-16 NOTE — Patient Instructions (Signed)
    SCAPULAR PROTRACTION - FREE WEIGHT - SERRATUS PUNCHES: 1-2 sets of 20 repetitions  Lie on your back holding a small free weight or soup can with your arm extended out in front of your body and towards the ceiling. While keeping your elbow straight, protract your shoulders forward towards the ceiling and then lower back down in a control motion.   Do not allow your shoulder to raise towards your ears.   Keep your elbow straight the entire time.

## 2017-10-21 ENCOUNTER — Ambulatory Visit (HOSPITAL_COMMUNITY): Payer: Medicare Other

## 2017-10-23 ENCOUNTER — Encounter (HOSPITAL_COMMUNITY): Payer: Self-pay

## 2017-10-23 ENCOUNTER — Ambulatory Visit (HOSPITAL_COMMUNITY): Payer: Medicare Other

## 2017-10-23 DIAGNOSIS — R29898 Other symptoms and signs involving the musculoskeletal system: Secondary | ICD-10-CM | POA: Diagnosis not present

## 2017-10-23 DIAGNOSIS — M546 Pain in thoracic spine: Secondary | ICD-10-CM | POA: Diagnosis not present

## 2017-10-23 DIAGNOSIS — M62830 Muscle spasm of back: Secondary | ICD-10-CM

## 2017-10-23 NOTE — Therapy (Signed)
[place], [name], [address] Phone: [phone]   Fax:  [phone]  Physical Therapy Treatment  Patient Details  Name: Heather [name] MRN: [id number] Date of Birth: 08-20-1946 Referring Provider: Raylene Everts, MD   Encounter Date: 10/23/2017  PT End of Session - 10/23/17 1350    Visit Number  8    Number of Visits  17    Date for PT Re-Evaluation  10/23/17    Authorization Type  Medicare Part A and B    Authorization Time Period  09/25/17-11/20/17    Authorization - Visit Number  8    Authorization - Number of Visits  10    PT Start Time  6808    PT Stop Time  1343    PT Time Calculation (min)  38 min    Activity Tolerance  Patient tolerated treatment well    Behavior During Therapy  [facility] for tasks assessed/performed       Past Medical History:  Diagnosis Date  . Adrenal adenoma   . Allergy   . Anemia   . Anxiety   . CAD (coronary artery disease)    50-60% mid LAD at cardiac catheterization 2014 [facility])  . Cataract   . Depression   . DJD (degenerative joint disease) of cervical spine 09/12/2016  . History of cardiomyopathy   . History of tobacco use   . Hyperlipidemia   . Neuromuscular disorder ([place])   . PAF (paroxysmal atrial fibrillation) [facility])    Diagnosis July 2017 - spontaneously resolved  . Statin intolerance     Past Surgical History:  Procedure Laterality Date  . ABDOMINAL HYSTERECTOMY  1980   endometriosis  . TONSILLECTOMY  1954    There were no vitals filed for this visit.  Subjective Assessment - 10/23/17 1311    Subjective  Patient states she has no pain in her upper back that she originially came in for since last Friday. She does note however, she was shaking snow off her bushes and has a small catch in her lower back that is just superior to sacrum (L4/5) area.  When describing the lower back she notes a small twinge when she bends over to do something but she  straightens up and is okay. It's not constant.     Currently in Pain?  No/denies         [facility] PT Assessment - 10/23/17 0001      AROM   Thoracic Flexion  WNL    Thoracic Extension  25% limited    Thoracic - Right Side Bend  WNL    Thoracic - Left Side Bend  WNL    Thoracic - Right Rotation  WNL    Thoracic - Left Rotation  WNL      Strength   Right Shoulder Flexion  5/5    Right Shoulder Extension  5/5    Right Shoulder ABduction  5/5    Right Hip Flexion  5/5    Right Hip Extension  4/5    Right Hip ABduction  4-/5    Left Hip Flexion  5/5    Left Hip Extension  4/5    Left Hip ABduction  4-/5    Right Knee Flexion  5/5    Right Knee Extension  5/5    Left Knee Flexion  5/5    Left Knee Extension  5/5  [facility] PT Treatment/Exercise - 10/23/17 0001      Lumbar Exercises: Sidelying   Clam  10 reps bilateral    Hip Abduction  10 reps bilateral      Shoulder Exercises: Supine   Other Supine Exercises  serratus anterior punch 1# for form, 5 repetitions BUE, no wieght; patient had excellent form and will initiate at home as part of HEP    Other Supine Exercises  Serratus punch with clockwise/counter-clockwise circles 1#, 10 reps each direction bilaterally             PT Education - 10/23/17 1354    Education provided  Yes    Education Details  Educated on protection of low back with current exercises for thoracic strengthening and stretching. Updated HEP.     Person(s) Educated  Patient    Methods  Explanation;Handout    Comprehension  Verbalized understanding       PT Short Term Goals - 10/23/17 1326      PT SHORT TERM GOAL #1   Title  Patient will be independent with HEP and be able to demonstrate proper exercise technique.    Time  2    Period  Weeks    Status  Achieved      PT SHORT TERM GOAL #2   Title  Patient will have decreased maximum pain of 4/10 or less thorughout daily activities to improve QOL.    Baseline   12/12: 0/10 pain for 4 days    Time  4    Period  Weeks    Status  Achieved      PT SHORT TERM GOAL #3   Title  Patient will be able to tolerate working at her computer for at least 30 minutes without and increase in pain to return to prior level of function with work.    Baseline  no limitations at this time due to pain    Time  4    Period  Weeks    Status  Achieved      PT SHORT TERM GOAL #4   Title  Patient will have improved MMT by 1/2 grade to progress strength for functinoal tasks/mobility.    Baseline  5/5 global strength     Time  4    Period  Weeks    Status  Achieved        PT Long Term Goals - 10/23/17 1338      PT LONG TERM GOAL #1   Title  Patient will have improved MMT by 1 grade to improve strenght for improved functional mobility.    Baseline  5/5 strength this session globally what was tested initial evaluation.     Time  8    Period  Weeks    Status  Achieved      PT LONG TERM GOAL #2   Title  Patient will be able to tolerate working at computer or cooking for 30 minutes or greater to improve QOL and return to prior level of function.    Baseline  Patient reports no difficulties with cooking or working at computer.     Time  8    Period  Weeks    Status  Achieved      PT LONG TERM GOAL #3   Title  Patient will have decreased maximum pain of 3/10 or less throughout daily activities to improve QOL and regain indpendence with functinoal activities and ADL's.    Baseline  Patient reports 0/10 pain  with activities.     Time  8    Period  Weeks    Status  Achieved      PT LONG TERM GOAL #4   Title  Patient will be indpendent with HEP and be prepared to transition to regular fitness program for strenghtening and flexibility.    Baseline  Patient reports completing HEP, was given 3 additional exercises this session, and participates in dancing weekly without pain.     Time  8    Period  Weeks    Status  Achieved            Plan - 10/23/17 1357     Clinical Impression Statement  Today's session focused on re-assessment, focusing on strength and thoracic mobility. Patient has made great progress towards STGs and LTGs meeting each one indicating an improvement in pain, strength, functional mobility and ability to complete ADLs without pain. Patient presented with irritation of lower lumbar spine and was verbally instructed on ways to protect it while completing her HEP for her thoracic spine. PT discussed bed mobility form with her, as well as, lifting techniques as she reports needing to get Christmas decorations out and put up this week. Patient declined wanting PT to look into getting a referral to treat her low back and stated "it has happened before and I know what to do, I don't' believe we need to set up more appointments."  Discussed with patient discharging this session and completing HEP. Pt agreed and is discharged at this time.     Rehab Potential  Good    Clinical Impairments Affecting Rehab Potential  (+) motivated to participate int herapy, (+) active lifestyle, (-) increased stress at work    PT Treatment/Interventions  ADLs/Self Care Home Management;Dry needling;Manual techniques;Therapeutic activities;Therapeutic exercise;Patient/family education;Moist Heat;Cryotherapy;Gait training;Stair training;Balance training;Neuromuscular re-education;Passive range of motion;Functional mobility training    PT Next Visit Plan  Disharge at this time     PT Home Exercise Plan  Scapular retraction with ER with red TB. Childs pose for thoracic spine stretch/opener. Shoulder rows with red theraband in standing. 12/5 - serratus punch    Consulted and Agree with Plan of Care  Patient       Patient will benefit from skilled therapeutic intervention in order to improve the following deficits and impairments:  Improper body mechanics, Decreased mobility, Impaired tone, Increased muscle spasms, Hypomobility, Impaired flexibility, Increased fascial  restricitons, Decreased strength, Postural dysfunction, Pain  Visit Diagnosis: Pain in thoracic spine  Muscle spasm of back  Other symptoms and signs involving the musculoskeletal system     Problem List Patient Active Problem List   Diagnosis Date Noted  . Tubular adenoma of colon 07/19/2017  . H. pylori infection 07/19/2017  . Thoracic back pain 02/08/2017  . Hx of colonic polyps 12/03/2016  . Family history of colon cancer 12/03/2016  . Encounter for therapeutic drug monitoring 11/21/2016  . CAD (coronary artery disease) 09/12/2016  . PAF (paroxysmal atrial fibrillation) ([place]) 09/12/2016  . Syncope 09/12/2016  . DJD (degenerative joint disease) of cervical spine 09/12/2016  . Statin intolerance 09/12/2016  . TIA (transient ischemic attack) 09/12/2016  . Diverticulitis of colon 09/12/2016  . Myofascial pain syndrome 09/06/2016  . Takotsubo syndrome 09/06/2016  . HLD (hyperlipidemia) 09/06/2016  . Depression with anxiety 09/06/2016  . Recurrent UTI (urinary tract infection) 09/06/2016    PHYSICAL THERAPY DISCHARGE SUMMARY  Visits from Start of Care: 8 completed  Current functional level related to goals / functional  outcomes: Patient has met all goals indicating improvement in strength and pain during functional activities.   Remaining deficits: Bilateral hip abduction strength remains 4-/5, decreased hip stability during ambulation    Education / Equipment: HEP updated this visit addressing further thoracic spine strengthening with scapula stability musculature and hip strengthening relating to continued deficits.  Plan: Patient agrees to discharge.  Patient goals were met. Patient is being discharged due to meeting the stated rehab goals.  ?????    Starr Lake PT, DPT 2:12 PM, 10/23/17 [place], [name], [address] Phone: [phone]   Fax:  [phone]  Name: Ellar Hakala MRN: [id number] Date of Birth: 04-13-1946

## 2017-10-23 NOTE — Patient Instructions (Signed)
  SIDELYING CLAMSHELL While lying on your side with your knees bent, draw up the top knee while keeping contact of your feet together. Do not let your pelvis roll back during the lifting movement. Repeat 10 Times Hold 5 Seconds Complete 2 Sets Perform 1 Time(s) a Day   HIP ABDUCTION - SIDELYING  While lying on your side, slowly raise up your top leg to the side. Keep your knee straight and maintain your toes pointed forward the entire time. Keep your leg in-line with your body. The bottom leg can be bent to stabilize your body.   Repeat 10 Times Hold 1 Second Complete 2 Sets Perform 1 Time(s) a Day   Supine Serratus Punch  Lying on your back, raise both arms in front to 90 degrees.  Punch your fists towards the ceiling lifting shoulder blades off of the mat.  Do not shrug shoulders.     Repeat 10 Times Hold 5 Seconds Complete 2 Sets Perform 1 Time(s) a Day   REVERSE PENDULUMS  Lying on your back, straighten your arm towards the ceiling. Next, move your arm in small circles in a clock-wise motion.  After a few seconds, reverse the direction to a counter-clockwise motion. Change directions every few seconds.    Duration 60 Seconds Complete 3 Sets Perform 1 Time(s) a Day   BODY MECHANICS - OVER HEAD LIFTING  Start by standing close to the object with feet spread apart. Bend at the knees and hips and NOT at your spine.   Hold the object close to your body as you use your legs muscles to stand back up lifting the object.   Walk over to the surface you want to set the object on and raise it up over head with a "one-hand-under and one-hand-over" technique as shown. Set it down and DO NOT extend at the spine. Also, be sure NOT to twist your spine but to pivot your feet so that your feet are pointed forward to where you want to set the object.   Slide the object on the shelf to off load your body.

## 2017-10-28 ENCOUNTER — Encounter (HOSPITAL_COMMUNITY): Payer: Medicare Other

## 2017-10-28 NOTE — Telephone Encounter (Signed)
Can you close this encounter?

## 2017-10-30 ENCOUNTER — Encounter (HOSPITAL_COMMUNITY): Payer: Medicare Other

## 2017-11-18 ENCOUNTER — Ambulatory Visit (INDEPENDENT_AMBULATORY_CARE_PROVIDER_SITE_OTHER): Payer: Medicare Other | Admitting: *Deleted

## 2017-11-18 DIAGNOSIS — Z5181 Encounter for therapeutic drug level monitoring: Secondary | ICD-10-CM

## 2017-11-18 DIAGNOSIS — I480 Paroxysmal atrial fibrillation: Secondary | ICD-10-CM

## 2017-11-18 LAB — POCT INR: INR: 2.7

## 2017-11-18 NOTE — Patient Instructions (Signed)
Continue coumadin 2 tablets daily except 1 tablet on Mondays, Wednesdays and Fridays.   Recheck in 6 weeks Continue greens

## 2017-11-26 ENCOUNTER — Other Ambulatory Visit: Payer: Self-pay | Admitting: Neurology

## 2017-11-26 MED ORDER — OXYCODONE-ACETAMINOPHEN 5-325 MG PO TABS: 1.0000 | ORAL_TABLET | Freq: Every day | ORAL | 0 refills | Status: DC | PRN

## 2017-12-11 ENCOUNTER — Telehealth: Payer: Self-pay

## 2017-12-11 MED ORDER — OXYCODONE-ACETAMINOPHEN 5-325 MG PO TABS: 1.0000 | ORAL_TABLET | Freq: Every day | ORAL | 0 refills | Status: DC | PRN

## 2017-12-11 NOTE — Telephone Encounter (Signed)
Patient was given an Rx for #30 and went to have it filled at [facility] in [place] but was told that she could only have a 5 day supply. I called [facility] to confirm and they said that if the patient hasn't had the Rx filled there within 30 days, they could only get a 5 days supply and then would need to return with a new Rx to receive the full Rx. I explained this to patient and she voiced understanding. New Rx printed, will have Dr. Felecia Shelling sign and will leave it at the front desk.

## 2017-12-16 ENCOUNTER — Encounter: Payer: Self-pay | Admitting: Family Medicine

## 2017-12-16 ENCOUNTER — Other Ambulatory Visit: Payer: Self-pay

## 2017-12-16 ENCOUNTER — Ambulatory Visit (INDEPENDENT_AMBULATORY_CARE_PROVIDER_SITE_OTHER): Payer: Medicare Other | Admitting: Family Medicine

## 2017-12-16 VITALS — BP 118/64 | HR 56 | Temp 97.8°F | Resp 16 | Ht 64.0 in

## 2017-12-16 DIAGNOSIS — Z23 Encounter for immunization: Secondary | ICD-10-CM | POA: Diagnosis not present

## 2017-12-16 DIAGNOSIS — R350 Frequency of micturition: Secondary | ICD-10-CM | POA: Diagnosis not present

## 2017-12-16 LAB — POCT URINALYSIS DIPSTICK
Bilirubin, UA: NEGATIVE
Glucose, UA: NEGATIVE
Ketones, UA: NEGATIVE
NITRITE UA: NEGATIVE
PROTEIN UA: NEGATIVE
Spec Grav, UA: 1.015 (ref 1.010–1.025)
Urobilinogen, UA: 0.2 E.U./dL
pH, UA: 6 (ref 5.0–8.0)

## 2017-12-16 MED ORDER — NITROFURANTOIN MONOHYD MACRO 100 MG PO CAPS: 100.0000 mg | ORAL_CAPSULE | Freq: Two times a day (BID) | ORAL | 1 refills | Status: DC

## 2017-12-16 NOTE — Progress Notes (Signed)
Chief Complaint  Patient presents with  . Urinary Frequency   Patient is here for dysuria and urinary frequency.  This is been going on for several days.  She is tried pushing fluids.  It is persisting.  She also has nocturia.  She is postmenopausal.  She is on estradiol.  She gets this from her gynecologist.  Is been no change in her medical status.  The only other thing she has noticed that she has painful sexual relations.  No bleeding.  No hematuria.  No flank pain.  No abdominal pain.  No fever.  She does have a history of recurring urinary tract infections.  Patient Active Problem List   Diagnosis Date Noted  . Tubular adenoma of colon 07/19/2017  . H. pylori infection 07/19/2017  . Thoracic back pain 02/08/2017  . Hx of colonic polyps 12/03/2016  . Family history of colon cancer 12/03/2016  . Encounter for therapeutic drug monitoring 11/21/2016  . CAD (coronary artery disease) 09/12/2016  . PAF (paroxysmal atrial fibrillation) ([place]) 09/12/2016  . Syncope 09/12/2016  . DJD (degenerative joint disease) of cervical spine 09/12/2016  . Statin intolerance 09/12/2016  . TIA (transient ischemic attack) 09/12/2016  . Diverticulitis of colon 09/12/2016  . Myofascial pain syndrome 09/06/2016  . Takotsubo syndrome 09/06/2016  . HLD (hyperlipidemia) 09/06/2016  . Depression with anxiety 09/06/2016  . Recurrent UTI (urinary tract infection) 09/06/2016    Outpatient Encounter Medications as of 12/16/2017  Medication Sig  . Biotin 10000 MCG TABS Take by mouth.  Marland Kitchen buPROPion (WELLBUTRIN SR) 150 MG 12 hr tablet Take 1 tablet (150 mg total) 2 (two) times daily by mouth.  . co-enzyme Q-10 30 MG capsule Take 100 mg by mouth daily.   Marland Kitchen docusate sodium (COLACE) 100 MG capsule Take 100 mg by mouth daily.  Marland Kitchen estradiol (ESTRACE) 1 MG tablet Take 1 tablet (1 mg total) by mouth daily.  . Multiple Vitamin (MULTIVITAMIN) tablet Take 1 tablet by mouth daily.  Marland Kitchen omega-3 acid ethyl esters (LOVAZA) 1 g  capsule Take 2 g by mouth 2 (two) times daily.  Marland Kitchen oxyCODONE-acetaminophen (ROXICET) 5-325 MG tablet Take 1 tablet by mouth daily as needed for severe pain.  Marland Kitchen tiZANidine (ZANAFLEX) 4 MG capsule Take 1 capsule (4 mg total) by mouth 3 (three) times daily.  . traMADol (ULTRAM) 50 MG tablet Take 1 tablet (50 mg total) by mouth daily as needed.  . warfarin (COUMADIN) 5 MG tablet TAKE TWO TABLETS BY MOUTH ONCE DAILY OR AS DIRECTED  . nitrofurantoin, macrocrystal-monohydrate, (MACROBID) 100 MG capsule Take 1 capsule (100 mg total) by mouth 2 (two) times daily.   No facility-administered encounter medications on file as of 12/16/2017.     Allergies  Allergen Reactions  . Statins Other (See Comments)    Intolerant all statins  . Sulfa Antibiotics Rash    Review of Systems  Constitutional: Negative for chills, fatigue and fever.  HENT: Negative for congestion and dental problem.   Eyes: Negative for photophobia.  Respiratory: Negative for cough and shortness of breath.   Cardiovascular: Negative for chest pain and palpitations.  Gastrointestinal: Negative for diarrhea, nausea and vomiting.  Genitourinary: Positive for dyspareunia, dysuria and frequency. Negative for difficulty urinating, flank pain and hematuria.  Musculoskeletal: Negative for arthralgias and back pain.  Neurological: Negative for dizziness and headaches.  Psychiatric/Behavioral: Negative for dysphoric mood. The patient is not nervous/anxious.   All other systems reviewed and are negative.   BP 118/64 (BP Location:  Right Arm, Patient Position: Sitting, Cuff Size: Normal)   Pulse (!) 56   Temp 97.8 F (36.6 C) (Temporal)   Resp 16   Ht 5\' 4"  (1.626 m)   LMP 07/08/1979 (Approximate)   SpO2 100%   BMI 26.09 kg/m   Physical Exam  Constitutional: She is oriented to person, place, and time. She appears well-developed and well-nourished. No distress.  Looks younger than stated age  HENT:  Head: Normocephalic and  atraumatic.  Mouth/Throat: Oropharynx is clear and moist.  Cardiovascular: Normal rate, regular rhythm and normal heart sounds.  Pulmonary/Chest: Effort normal and breath sounds normal.  Abdominal: Soft. Bowel sounds are normal. There is no tenderness.  Musculoskeletal: Normal range of motion. She exhibits no edema.  Neurological: She is alert and oriented to person, place, and time.  Psychiatric: She has a normal mood and affect. Her behavior is normal. Thought content normal.   Dip urinalysis shows trace leukocytes and trace intact blood, no nitrates  ASSESSMENT/PLAN:  1. Urinary frequency Explained to the patient that her urinalysis is suggestive for UTI but not diagnostic.  I am going to do a urine culture.  I gave her a prescription for Macrodantin.  I told her not to use it unless the culture came back positive.  She does like to keep Macrodantin at home and take a pill after sexual relations to prevent urinary tract infections.  This is prescribed to her by her prior gynecologist.  I explained to her for the painful sex relations that she needs to see her GYN. - POCT urinalysis dipstick - Urine Culture   Patient Instructions  Push fluids Take the macrobid as indicated I have given you a refill If the frequency persists let me know and I will find out about botox    Raylene Everts, MD

## 2017-12-16 NOTE — Patient Instructions (Signed)
Push fluids Take the macrobid as indicated I have given you a refill If the frequency persists let me know and I will find out about botox

## 2017-12-17 LAB — URINE CULTURE
MICRO NUMBER: 90148519
SPECIMEN QUALITY:: ADEQUATE

## 2018-01-03 NOTE — Progress Notes (Signed)
Cardiology Office Note  Date: 01/06/2018   ID: Heather [name], DOB 1946/07/15, MRN [id number]  PCP: Raylene Everts, MD  Primary Cardiologist: Rozann Lesches, MD   Chief Complaint  Patient presents with  . PAF    History of Present Illness: Heather [name] is a 72 y.o. female last seen in August 2018.  She presents today for a routine follow-up visit.  Since last assessment she has had only very brief palpitations, not overly bothersome to her.  She is trying to exercise, ballroom dancing with her husband.  Reports no major functional limitation in terms of shortness of breath or angina.  She continues on Coumadin with follow-up in the anticoagulation clinic.  She has had no spontaneous bleeding problems.  I personally reviewed her ECG today which shows sinus bradycardia with nonspecific ST-T changes.  Past Medical History:  Diagnosis Date  . Adrenal adenoma   . Allergy   . Anemia   . Anxiety   . CAD (coronary artery disease)    50-60% mid LAD at cardiac catheterization 2014 [facility])  . Cataract   . Depression   . DJD (degenerative joint disease) of cervical spine 09/12/2016  . History of cardiomyopathy   . History of tobacco use   . Hyperlipidemia   . Neuromuscular disorder ([place])   . PAF (paroxysmal atrial fibrillation) [facility])    Diagnosis July 2017 - spontaneously resolved  . Statin intolerance     Past Surgical History:  Procedure Laterality Date  . ABDOMINAL HYSTERECTOMY  1980   endometriosis  . TONSILLECTOMY  1954    Current Outpatient Medications  Medication Sig Dispense Refill  . Biotin 10000 MCG TABS Take by mouth.    Marland Kitchen buPROPion (WELLBUTRIN SR) 150 MG 12 hr tablet Take 1 tablet (150 mg total) 2 (two) times daily by mouth. 180 tablet 1  . co-enzyme Q-10 30 MG capsule Take 100 mg by mouth daily.     Marland Kitchen docusate sodium (COLACE) 100 MG capsule Take 100 mg by mouth daily.    Marland Kitchen estradiol (ESTRACE) 1 MG tablet Take 1 tablet (1 mg total) by mouth daily. 30  tablet 11  . Multiple Vitamin (MULTIVITAMIN) tablet Take 1 tablet by mouth daily.    Marland Kitchen oxyCODONE-acetaminophen (ROXICET) 5-325 MG tablet Take 1 tablet by mouth daily as needed for severe pain. 30 tablet 0  . tiZANidine (ZANAFLEX) 4 MG capsule Take 1 capsule (4 mg total) by mouth 3 (three) times daily. 90 capsule 5  . traMADol (ULTRAM) 50 MG tablet Take 1 tablet (50 mg total) by mouth daily as needed. 30 tablet 0  . warfarin (COUMADIN) 5 MG tablet TAKE TWO TABLETS BY MOUTH ONCE DAILY OR AS DIRECTED 180 tablet 1   No current facility-administered medications for this visit.    Allergies:  Statins and Sulfa antibiotics   Social History: The patient  reports that she quit smoking about 9 years ago. Her smoking use included cigarettes. She started smoking about 52 years ago. She smoked 0.00 packs per day. she has never used smokeless tobacco. She reports that she drinks about 0.6 oz of alcohol per week. She reports that she uses drugs. Drug: Marijuana.   ROS:  Please see the history of present illness. Otherwise, complete review of systems is positive for weight gain related to overeating recently.  All other systems are reviewed and negative.   Physical Exam: VS:  BP 130/82   Pulse 66   Ht 5\' 4"  (1.626 m)   Wt  160 lb 3.2 oz (72.7 kg)   LMP 07/08/1979 (Approximate)   SpO2 98%   BMI 27.50 kg/m , BMI Body mass index is 27.5 kg/m.  Wt Readings from Last 3 Encounters:  01/06/18 160 lb 3.2 oz (72.7 kg)  07/10/17 152 lb (68.9 kg)  05/23/17 151 lb (68.5 kg)    General: Patient appears comfortable at rest. HEENT: Conjunctiva and lids normal, oropharynx clear. Neck: Supple, no elevated JVP or carotid bruits, no thyromegaly. Lungs: Clear to auscultation, nonlabored breathing at rest. Cardiac: Regular rate and rhythm, no S3 or significant systolic murmur, no pericardial rub. Abdomen: Soft, nontender, bowel sounds present. Extremities: No pitting edema, distal pulses 2+. Skin: Warm and  dry. Musculoskeletal: No kyphosis. Neuropsychiatric: Alert and oriented x3, affect grossly appropriate.  ECG: I personally reviewed the tracing from 05/18/2016 which showed sinus bradycardia with prolonged PR interval.  Recent Labwork:  October 2017: Cholesterol 385, triglycerides 78, HDL 81, LDL 288, ALT 14, AST 15, BUN 17, creatinine 0.81  Other Studies Reviewed Today:  Cardiac catheterization 03/06/2013 [facility]): Left main with mild luminal irregularities, 50-60% mid LAD stenosis spanning the origin of moderate caliber second diagonal branch which is larger in caliber than the distal LAD, left circumflex with mild luminal irregularities, RCA with mild luminal irregularities.  Echocardiogram 11/21/2016: Study Conclusions  - Left ventricle: The cavity size was normal. Systolic function was normal. The estimated ejection fraction was in the range of 50% to 55%. Doppler parameters are consistent with abnormal left ventricular relaxation (grade 1 diastolic dysfunction). - Mitral valve: Calcified annulus. Normal thickness leaflets . - Left atrium: The atrium was mildly to moderately dilated. - Tricuspid valve: There was mild regurgitation.  Assessment and Plan:  1.  Paroxysmal atrial fibrillation with CHADSVASC score of 3.  She remains on Coumadin with follow-up in the anticoagulation clinic.  She is in sinus rhythm today and reports only very rare palpitations.  2.  History of nonobstructive coronary atherosclerosis based on previous workup.  She does not report any active angina symptoms and indicates good exercise tolerance.  Reviewed her ECG today.  3.  Suspected familial hyperlipidemia with statin intolerance.  I have offered her evaluation in the lipid clinic and other treatment options such as Repatha.  He has not elected to pursue these.  Current medicines were reviewed with the patient today.   Orders Placed This Encounter  Procedures  . EKG  12-Lead    Disposition: Follow-up in 6 months.  Signed, Satira Sark, MD, [facility] 01/06/2018 12:05 PM    [place] [facility] [address], [place], [place] [address] Phone: [phone]; Fax: [phone]

## 2018-01-06 ENCOUNTER — Ambulatory Visit (INDEPENDENT_AMBULATORY_CARE_PROVIDER_SITE_OTHER): Payer: Medicare Other | Admitting: *Deleted

## 2018-01-06 ENCOUNTER — Ambulatory Visit (INDEPENDENT_AMBULATORY_CARE_PROVIDER_SITE_OTHER): Payer: Medicare Other | Admitting: Cardiology

## 2018-01-06 ENCOUNTER — Encounter: Payer: Self-pay | Admitting: Cardiology

## 2018-01-06 VITALS — BP 130/82 | HR 66 | Ht 64.0 in | Wt 160.2 lb

## 2018-01-06 DIAGNOSIS — E782 Mixed hyperlipidemia: Secondary | ICD-10-CM | POA: Diagnosis not present

## 2018-01-06 DIAGNOSIS — Z789 Other specified health status: Secondary | ICD-10-CM

## 2018-01-06 DIAGNOSIS — I480 Paroxysmal atrial fibrillation: Secondary | ICD-10-CM

## 2018-01-06 DIAGNOSIS — I25810 Atherosclerosis of coronary artery bypass graft(s) without angina pectoris: Secondary | ICD-10-CM

## 2018-01-06 DIAGNOSIS — Z5181 Encounter for therapeutic drug level monitoring: Secondary | ICD-10-CM

## 2018-01-06 LAB — POCT INR: INR: 1.7

## 2018-01-06 NOTE — Patient Instructions (Signed)
Your physician wants you to follow-up in:6 months with Dr.McDowell You will receive a reminder letter in the mail two months in advance. If you don't receive a letter, please call our office to schedule the follow-up appointment.   Your physician recommends that you continue on your current medications as directed. Please refer to the Current Medication list given to you today.   If you need a refill on your cardiac medications before your next appointment, please call your pharmacy.    No lab work or tests ordered today.      Thank you for choosing [facility] !         

## 2018-01-06 NOTE — Patient Instructions (Signed)
Take 2 tablets tonight then resume 2 tablets daily except 1 tablet on Mondays, Wednesdays and Fridays.   Recheck in 3 weeks Continue greens

## 2018-01-10 ENCOUNTER — Other Ambulatory Visit: Payer: Self-pay | Admitting: Family Medicine

## 2018-01-10 DIAGNOSIS — Z1231 Encounter for screening mammogram for malignant neoplasm of breast: Secondary | ICD-10-CM

## 2018-01-20 ENCOUNTER — Encounter: Payer: Self-pay | Admitting: Family Medicine

## 2018-02-07 ENCOUNTER — Other Ambulatory Visit: Payer: Self-pay

## 2018-02-07 ENCOUNTER — Encounter: Payer: Self-pay | Admitting: Family Medicine

## 2018-02-07 ENCOUNTER — Telehealth: Payer: Self-pay | Admitting: Family Medicine

## 2018-02-07 ENCOUNTER — Ambulatory Visit (INDEPENDENT_AMBULATORY_CARE_PROVIDER_SITE_OTHER): Payer: Medicare Other | Admitting: Family Medicine

## 2018-02-07 VITALS — BP 120/58 | HR 71 | Temp 97.8°F | Resp 14 | Ht 64.0 in | Wt 154.1 lb

## 2018-02-07 DIAGNOSIS — J0100 Acute maxillary sinusitis, unspecified: Secondary | ICD-10-CM

## 2018-02-07 MED ORDER — AMOXICILLIN 875 MG PO TABS: 875.0000 mg | ORAL_TABLET | Freq: Two times a day (BID) | ORAL | 0 refills | Status: DC

## 2018-02-07 MED ORDER — BUPROPION HCL ER (SR) 150 MG PO TB12: 150.0000 mg | ORAL_TABLET | Freq: Two times a day (BID) | ORAL | 1 refills | Status: DC

## 2018-02-07 NOTE — Telephone Encounter (Signed)
Request refill while she gets another PCP.  Please call in buPROPion [facility] SR) 150 MG 12 hr tablet [[id number]]   I did let her know [facility] will not be able to assume her care.

## 2018-02-07 NOTE — Patient Instructions (Signed)
Take the amoxil as directed Use the steroid nasal spray as directed until symptoms improve Continue nasal steroid  Call for problems

## 2018-02-07 NOTE — Telephone Encounter (Signed)
Left voicemail for patient to call back, and schedule an appt.   She left a voicemail for [place] to call her and schedule an appt for a sinus infection.

## 2018-02-07 NOTE — Progress Notes (Signed)
Chief Complaint  Patient presents with  . Nasal Congestion    x 8 days tried OTC meds. not helping   URI for 8 days. No getting better with oTC meds. She has sinus pressure and pain, postnasal drip, pain in her cheeks and anterior teeth.  She feels like it is a sinus infection.  Moderate sore throat.  Headache and fatigue.  Some chills indicating fever, has not taken her temperature.  No ear pressure or pain.  Minor underlying allergies.  Is not getting better with Mucinex and nasal sprays.  She has no underlying lung disease asthma or COPD.  She does not have any chest pain shortness of breath wheezing or chest congestion. Because of persistent symptoms she went to the urgent care this morning.  She had a chest x-ray that was negative.  Strep test was negative.  Flu testing was negative.  This doctor told her she had a sinus infection.  He gave her Omnicef.  This is very expensive.  It is also moderately contraindicated with her warfarin.  She is here to see if she can get a different antibiotic. I discussed with her that most of these sinus infections, even with yellow mucus, are caused by a virus.  Because she has had symptoms for 9 days not getting better, will cover her with an antibiotic.  I am giving her amoxicillin 875 twice daily.  She is advised to take it for 5 days, and then decide if she needs the second 5 days.  She should use a steroid nasal spray, push fluids, saline nasal spray, and give this a few days to improve.  Will call me if not improving by early next week.  Patient Active Problem List   Diagnosis Date Noted  . Tubular adenoma of colon 07/19/2017  . H. pylori infection 07/19/2017  . Thoracic back pain 02/08/2017  . Hx of colonic polyps 12/03/2016  . Family history of colon cancer 12/03/2016  . Encounter for therapeutic drug monitoring 11/21/2016  . CAD (coronary artery disease) 09/12/2016  . PAF (paroxysmal atrial fibrillation) ([place]) 09/12/2016  . Syncope  09/12/2016  . DJD (degenerative joint disease) of cervical spine 09/12/2016  . Statin intolerance 09/12/2016  . TIA (transient ischemic attack) 09/12/2016  . Diverticulitis of colon 09/12/2016  . Myofascial pain syndrome 09/06/2016  . Takotsubo syndrome 09/06/2016  . HLD (hyperlipidemia) 09/06/2016  . Depression with anxiety 09/06/2016  . Recurrent UTI (urinary tract infection) 09/06/2016    Outpatient Encounter Medications as of 02/07/2018  Medication Sig  . Biotin 10000 MCG TABS Take by mouth.  Marland Kitchen buPROPion (WELLBUTRIN SR) 150 MG 12 hr tablet Take 1 tablet (150 mg total) by mouth 2 (two) times daily.  Marland Kitchen co-enzyme Q-10 30 MG capsule Take 100 mg by mouth daily.   Marland Kitchen docusate sodium (COLACE) 100 MG capsule Take 100 mg by mouth daily.  Marland Kitchen estradiol (ESTRACE) 1 MG tablet Take 1 tablet (1 mg total) by mouth daily.  . Multiple Vitamin (MULTIVITAMIN) tablet Take 1 tablet by mouth daily.  Marland Kitchen oxyCODONE-acetaminophen (ROXICET) 5-325 MG tablet Take 1 tablet by mouth daily as needed for severe pain.  Marland Kitchen tiZANidine (ZANAFLEX) 4 MG capsule Take 1 capsule (4 mg total) by mouth 3 (three) times daily.  . traMADol (ULTRAM) 50 MG tablet Take 1 tablet (50 mg total) by mouth daily as needed.  . warfarin (COUMADIN) 5 MG tablet TAKE TWO TABLETS BY MOUTH ONCE DAILY OR AS DIRECTED  . [DISCONTINUED] buPROPion [facility] SR)  150 MG 12 hr tablet Take 1 tablet (150 mg total) 2 (two) times daily by mouth.  Marland Kitchen amoxicillin (AMOXIL) 875 MG tablet Take 1 tablet (875 mg total) by mouth 2 (two) times daily.  . [DISCONTINUED] amoxicillin (AMOXIL) 875 MG tablet Take 1 tablet (875 mg total) by mouth 2 (two) times daily.   No facility-administered encounter medications on file as of 02/07/2018.     Allergies  Allergen Reactions  . Statins Other (See Comments)    Intolerant all statins  . Sulfa Antibiotics Rash    Review of Systems  Constitutional: Positive for activity change, appetite change and fatigue.  HENT: Positive  for congestion, postnasal drip, rhinorrhea, sinus pressure and sore throat. Negative for sinus pain.   Eyes: Negative for redness and visual disturbance.  Respiratory: Negative for cough, shortness of breath and wheezing.   Cardiovascular: Negative for chest pain and palpitations.  Gastrointestinal: Negative for diarrhea, nausea and vomiting.  Musculoskeletal: Negative for arthralgias and myalgias.  Neurological: Positive for headaches. Negative for dizziness.    BP (!) 120/58   Pulse 71   Temp 97.8 F (36.6 C)   Resp 14   Ht 5\' 4"  (1.626 m)   Wt 154 lb 1.9 oz (69.9 kg)   LMP 07/08/1979 (Approximate)   SpO2 98%   BMI 26.45 kg/m   Physical Exam  Constitutional: She is oriented to person, place, and time. She appears well-developed and well-nourished. She appears distressed.  Appears moderately tired, ill  HENT:  Head: Normocephalic and atraumatic.  Right Ear: External ear normal.  Left Ear: External ear normal.  Mouth/Throat: Oropharynx is clear and moist.  Nasal membranes slightly congested.  Tenderness over maxillary sinuses.  Posterior pharynx mildly injected.  Eyes: Pupils are equal, round, and reactive to light. Conjunctivae are normal.  Neck: Normal range of motion.  Cardiovascular: Normal rate.  Pulmonary/Chest: Effort normal and breath sounds normal. She has no rales.  Lymphadenopathy:    She has no cervical adenopathy.  Neurological: She is alert and oriented to person, place, and time.  Psychiatric: She has a normal mood and affect. Her behavior is normal.    ASSESSMENT/PLAN:  1. Acute non-recurrent maxillary sinusitis As discussed in HPI.   Patient Instructions  Take the amoxil as directed Use the steroid nasal spray as directed until symptoms improve Continue nasal steroid  Call for problems   Raylene Everts, MD

## 2018-02-07 NOTE — Telephone Encounter (Signed)
Done. thanks

## 2018-02-12 ENCOUNTER — Ambulatory Visit (INDEPENDENT_AMBULATORY_CARE_PROVIDER_SITE_OTHER): Payer: Medicare Other | Admitting: *Deleted

## 2018-02-12 DIAGNOSIS — I480 Paroxysmal atrial fibrillation: Secondary | ICD-10-CM

## 2018-02-12 DIAGNOSIS — Z5181 Encounter for therapeutic drug level monitoring: Secondary | ICD-10-CM

## 2018-02-12 LAB — POCT INR: INR: 2.1

## 2018-02-12 NOTE — Patient Instructions (Signed)
Continue coumadin 2 tablets daily except 1 tablet on Mondays, Wednesdays and Fridays.   Recheck in 4 weeks Continue greens

## 2018-02-13 ENCOUNTER — Ambulatory Visit (HOSPITAL_COMMUNITY): Payer: Medicare Other

## 2018-02-14 ENCOUNTER — Ambulatory Visit (HOSPITAL_COMMUNITY)
Admission: RE | Admit: 2018-02-14 | Discharge: 2018-02-14 | Disposition: A | Discharge status: Home / Self Care | Payer: Medicare Other | Source: Ambulatory Visit | Attending: Family Medicine | Admitting: Family Medicine

## 2018-02-14 DIAGNOSIS — Z1231 Encounter for screening mammogram for malignant neoplasm of breast: Secondary | ICD-10-CM | POA: Insufficient documentation

## 2018-02-14 IMAGING — MG DIGITAL SCREENING BILATERAL MAMMOGRAM WITH TOMO AND CAD
6 of 9 series · 6 of 25 positions shown · non-contrast
Comparison: Previous exam(s).

CLINICAL DATA: Screening.

EXAM:
DIGITAL SCREENING BILATERAL MAMMOGRAM WITH TOMO AND CAD

[R MLO (1 of 2)]
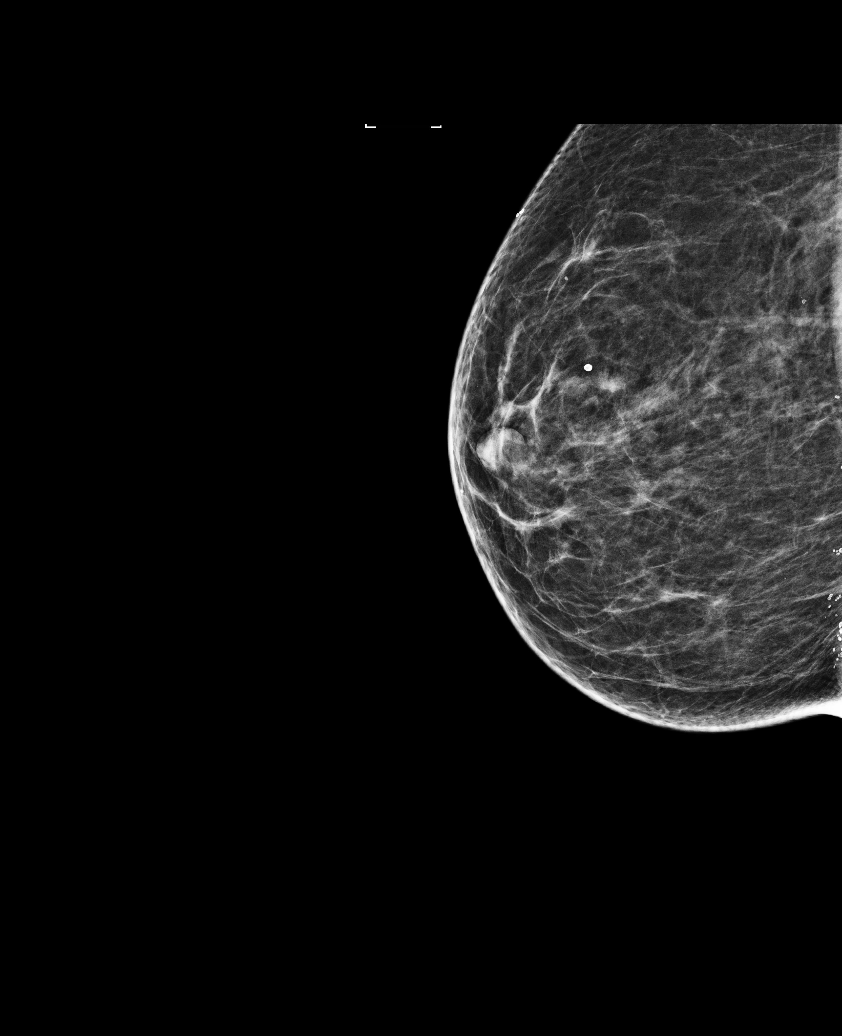

[R MLO (2 of 2)]
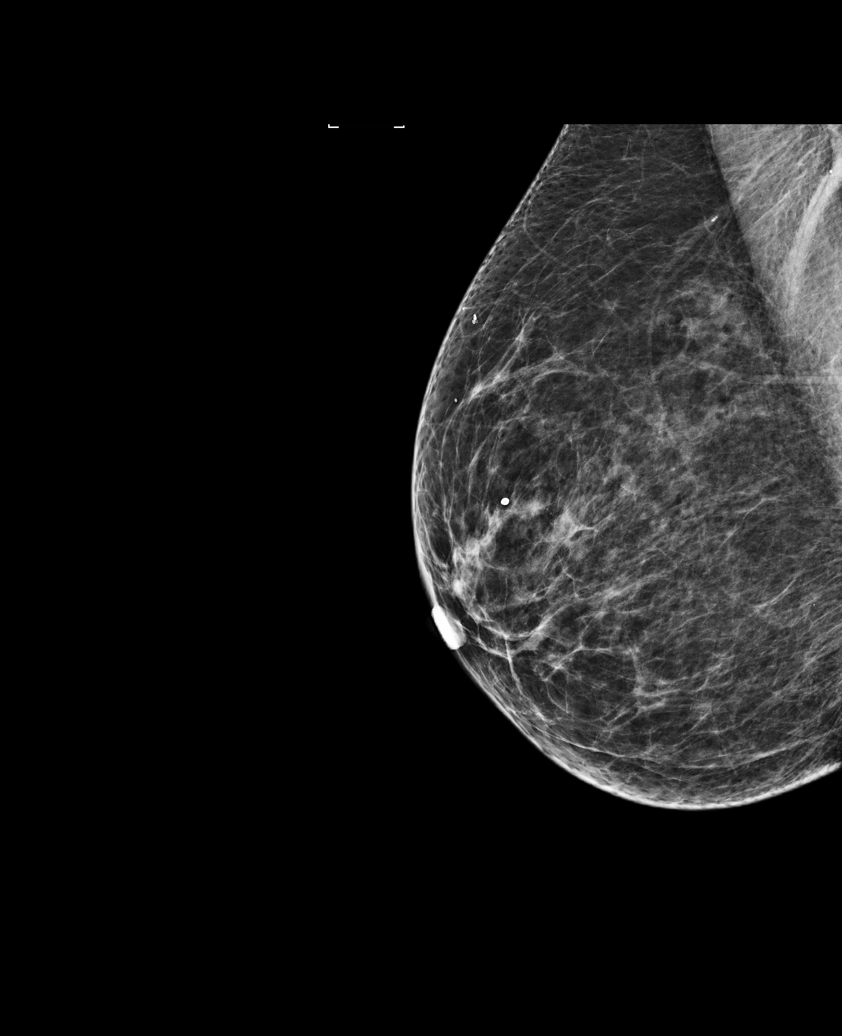

[L MLO]
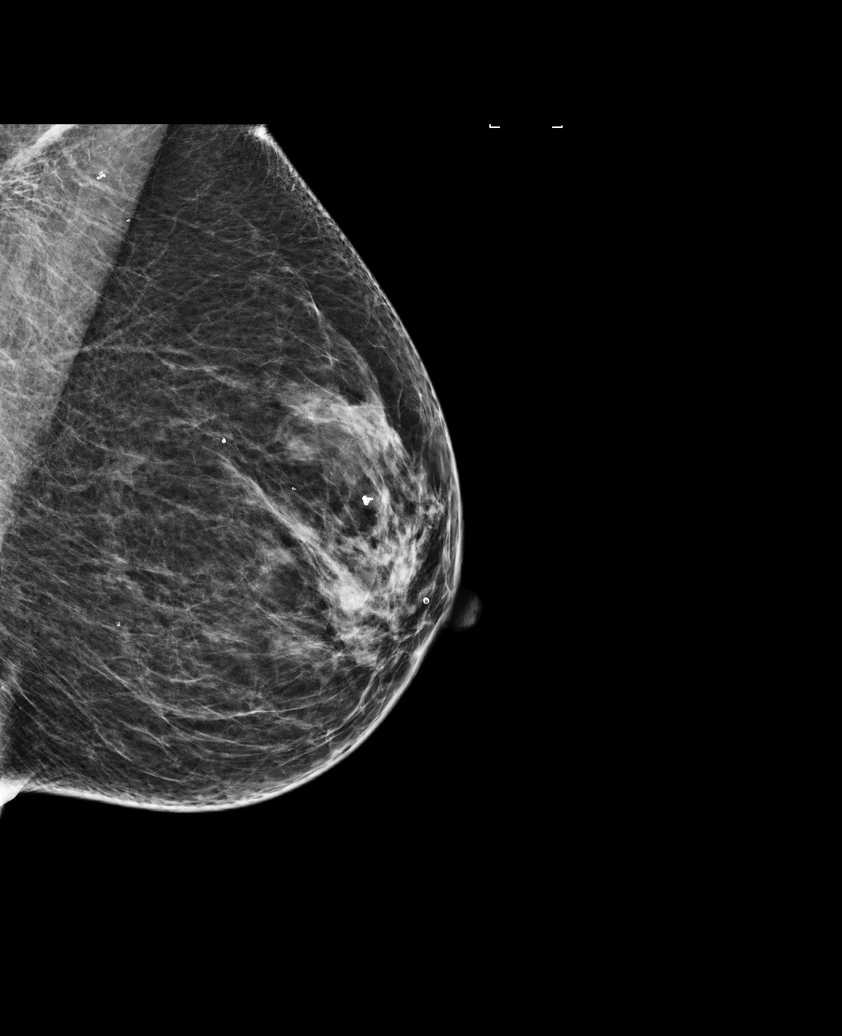

[L CC]
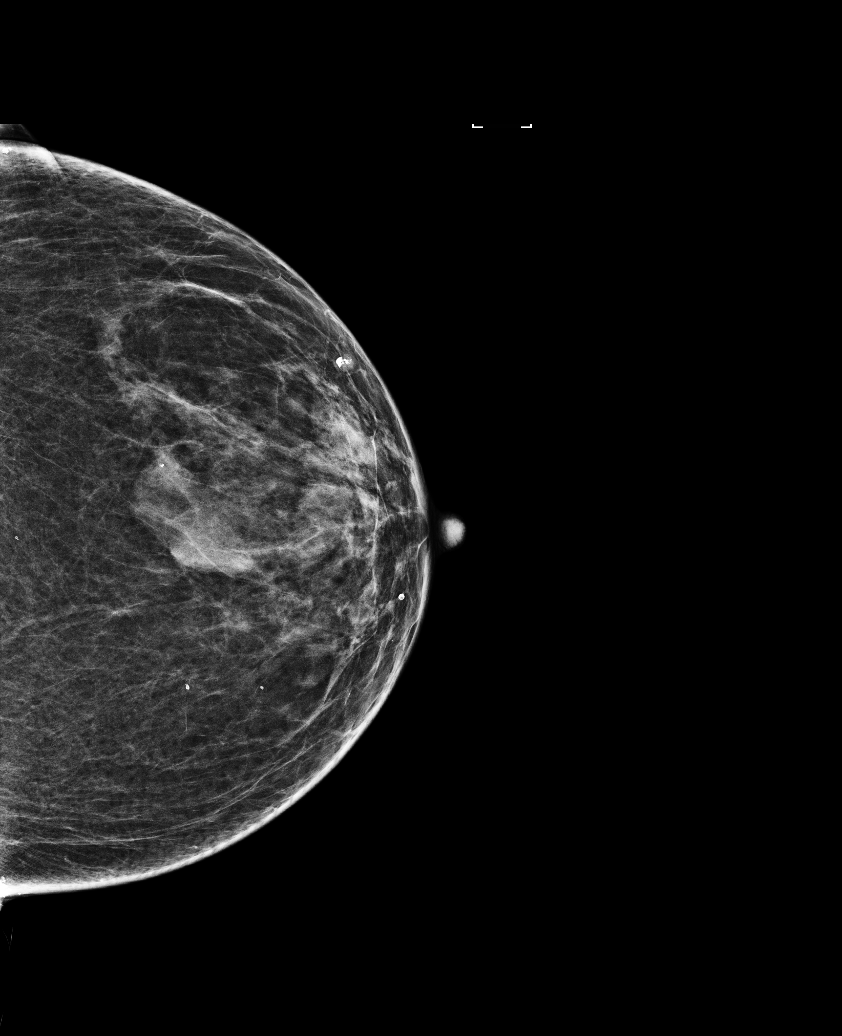

[R CC]
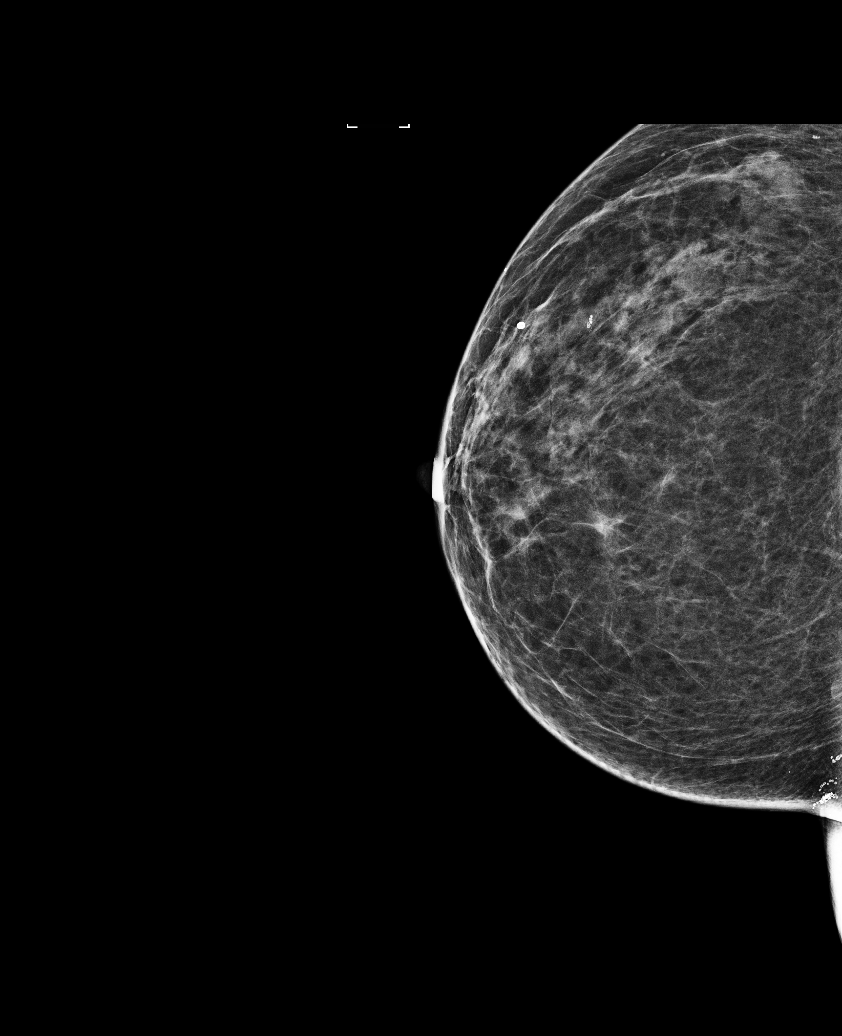

[R CC tomo · tomo slice 26/51.0]
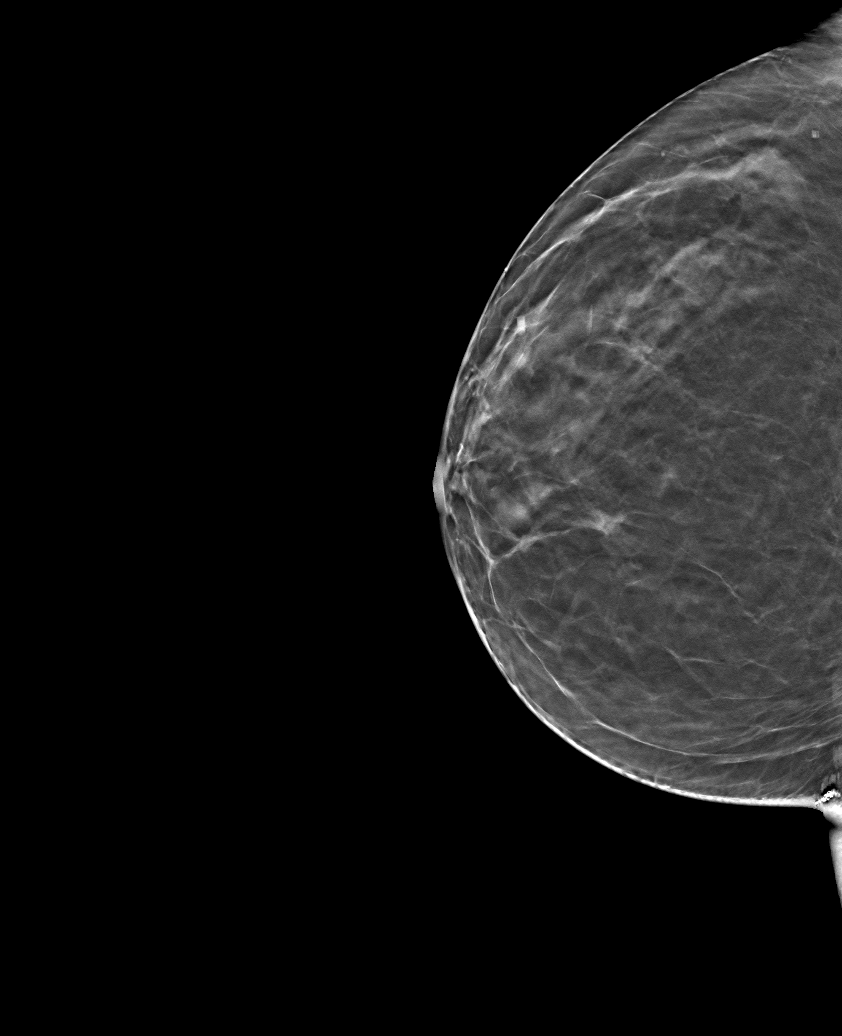

[6 of 25 positions shown; findings below may reference images not displayed]

ACR Breast Density Category b: There are scattered areas of
fibroglandular density.
FINDINGS: There are no findings suspicious for malignancy. Images were
processed with CAD.
IMPRESSION: No mammographic evidence of malignancy. A result letter of this
screening mammogram will be mailed directly to the patient.

RECOMMENDATION:
Screening mammogram in one year. (Code:[TQ])

BI-RADS CATEGORY  1: Negative.

## 2018-02-21 ENCOUNTER — Telehealth: Payer: Self-pay | Admitting: Neurology

## 2018-02-21 NOTE — Telephone Encounter (Signed)
Spoke with Heather [name].  She is having an exacerbation of her right sided back pain.  Reports that she is unable to take Oxycodone, Tramadol or Tizanidine b/c these all cause gi upset. Sts. tpi given at last ov helped a little, wonders if the ER will give a tpi, and I have advised that they likely will not, but if her pain is unbearable and she needs to be seen prior to her appt. on 02/25/18, she should go to the ER or urgent care. She also sts. her tens unit helps some. Sts. she has Diazepam 10mg  left over from an old rx. and this helps some.  Although she sts. she is not taking the Oxycodone, Tizanidine or Tramadol, I have still advised that she should not take Diazepam with these medications, and she verbalized understanding of same. I have also advised alternating heat/ice, and gentle massage if tolerated.  She would like to discuss if Botox would be an option for her and I have explained that RAS will explore this option with her on Tuesday./fim

## 2018-02-21 NOTE — Telephone Encounter (Signed)
Pt states that she is in an extreme level of pain and would like to know if Dr Felecia Shelling would suggest she go to the ED or suggest something else for the pain. She states she is unable to do anything exa: use a mouse or even comb her hair.  Please call

## 2018-02-25 ENCOUNTER — Other Ambulatory Visit: Payer: Self-pay

## 2018-02-25 ENCOUNTER — Ambulatory Visit (INDEPENDENT_AMBULATORY_CARE_PROVIDER_SITE_OTHER): Payer: Medicare Other | Admitting: Neurology

## 2018-02-25 ENCOUNTER — Encounter: Payer: Self-pay | Admitting: Neurology

## 2018-02-25 VITALS — BP 110/62 | HR 70 | Resp 16 | Ht 65.0 in | Wt 154.5 lb

## 2018-02-25 DIAGNOSIS — M7918 Myalgia, other site: Secondary | ICD-10-CM

## 2018-02-25 DIAGNOSIS — G8929 Other chronic pain: Secondary | ICD-10-CM

## 2018-02-25 DIAGNOSIS — M62838 Other muscle spasm: Secondary | ICD-10-CM

## 2018-02-25 DIAGNOSIS — I25810 Atherosclerosis of coronary artery bypass graft(s) without angina pectoris: Secondary | ICD-10-CM

## 2018-02-25 DIAGNOSIS — M546 Pain in thoracic spine: Secondary | ICD-10-CM

## 2018-02-25 MED ORDER — DIAZEPAM 5 MG PO TABS: 5.0000 mg | ORAL_TABLET | Freq: Four times a day (QID) | ORAL | 0 refills | Status: DC | PRN

## 2018-02-25 NOTE — Progress Notes (Signed)
[facility]  PATIENT: Heather [name] DOB: Mar 25, 1946  REFERRING DOCTOR OR PCP:  Blanchie Serve SOURCE:   _________________________________   HISTORICAL  CHIEF COMPLAINT:  Chief Complaint  Patient presents with  . Back Pain    Recent exacerbation of right sided thoracic, arm pain. Pain almost completedly resolved now, with exercise, Ibuprofen, Diazepam and Marijuana. Would like to discuss if Botox would be a possible treatment option for future exacerbations.  Sts. she is not able to tolerate Oxycodone and Tizanidine/fim    HISTORY OF PRESENT ILLNESS:  Heather [name] is a 72 year old woman who has had chronic right-sided mid back pain since 2013.      Update 02/25/2018: Her right sided thoracic pain flared up a week ago and was intense x 2-3 days.   Opioids make her feel sick. Diazepam 10 mg, Ibu and MJ helped her pain.    Currently pain is much better (improved over last 24 hours).   She also used the [employer] patch.     The first TPI we did helped her pain but the more recent one made pain worse.     She is wondering about Botox.    We discussed it in more detail.She  Is on coumadin for Afib.     From 05/01/2017: Her pain is about 1 inch right of midline in the lower thoracic paraspinal region. 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 Pain worsens often with household chores or gardening. The pain may improve with using a TENS unit, a trigger point or Percocet. Heating pad also sometimes helps. When pain is more severe she has smoked marijuana with some benefit. Cannabinol oil did not help. Flexeril did not help. More recently she took a tizanidine and was very sleepy though pain was better that day. More recently, she took a tizanidine 4 mg but fell asleep 1-2 hours.  The trigger point injection at the last visit (thoracic paraspinal) did not help.   She denies any numbness or weakness in her arms or legs.   No bowel or bladder  changes.    She is on coumadin for recently diagnose atrial fibrillation.      She was getting trigger point injections every 3-6 months for most of the past 4 years but needed more frequently last year when she was packing up to move to this area.        I reviewed her MRI results from 10/14/2013. The MRI of the brain showed age related atrophy and minimal chronic buccal vascular ischemic change. MRI of the cervical spine showed multilevel mild degenerative changes with left paramedian disc herniation at C3-C4 and left disc protrusion at C4-C5 and C5-C6 and midline disc herniation at C6-C7. There was no report of nerve root compression. MRI of the thoracic spine showed disc desiccation but no herniation or protrusions. MRI of the lumbar spine showed disc bulges at T12-L1 and L2-L3 and disc bulge with facet hypertrophy at L3-L4 and disc bulge with right foraminal annular tear at L4-L5.   REVIEW OF SYSTEMS: Constitutional: No fevers, chills, sweats, or change in appetite Eyes: No visual changes, double vision, eye pain Ear, nose and throat: No hearing loss, ear pain, nasal congestion, sore throat Cardiovascular: No chest pain, palpitations.   She has recently diagnose atrial fibrillation. Respiratory: No shortness of breath at rest or with exertion.   No wheezes GastrointestinaI: No nausea, vomiting, diarrhea, abdominal pain, fecal incontinence Genitourinary: No dysuria, urinary retention  or frequency.  No nocturia. Musculoskeletal: No neck pain, back pain/thoracic pain as above Integumentary: No rash, pruritus, skin lesions Neurological: as above Psychiatric: No depression at this time.  No anxiety Endocrine: No palpitations, diaphoresis, change in appetite, change in weigh or increased thirst Hematologic/Lymphatic: No anemia, purpura, petechiae. Allergic/Immunologic: No itchy/runny eyes, nasal congestion, recent allergic reactions, rashes  ALLERGIES: Allergies  Allergen Reactions  .  Statins Other (See Comments)    Intolerant all statins  . Sulfa Antibiotics Rash    HOME MEDICATIONS:  Current Outpatient Medications:  .  amoxicillin (AMOXIL) 875 MG tablet, Take 1 tablet (875 mg total) by mouth 2 (two) times daily., Disp: 20 tablet, Rfl: 0 .  Biotin 10000 MCG TABS, Take by mouth., Disp: , Rfl:  .  buPROPion (WELLBUTRIN SR) 150 MG 12 hr tablet, Take 1 tablet (150 mg total) by mouth 2 (two) times daily., Disp: 180 tablet, Rfl: 1 .  co-enzyme Q-10 30 MG capsule, Take 100 mg by mouth daily. , Disp: , Rfl:  .  docusate sodium (COLACE) 100 MG capsule, Take 100 mg by mouth daily., Disp: , Rfl:  .  estradiol (ESTRACE) 1 MG tablet, Take 1 tablet (1 mg total) by mouth daily., Disp: 30 tablet, Rfl: 11 .  Multiple Vitamin (MULTIVITAMIN) tablet, Take 1 tablet by mouth daily., Disp: , Rfl:  .  traMADol (ULTRAM) 50 MG tablet, Take 1 tablet (50 mg total) by mouth daily as needed., Disp: 30 tablet, Rfl: 0 .  warfarin (COUMADIN) 5 MG tablet, TAKE TWO TABLETS BY MOUTH ONCE DAILY OR AS DIRECTED, Disp: 180 tablet, Rfl: 1 .  diazepam (VALIUM) 5 MG tablet, Take 1 tablet (5 mg total) by mouth every 6 (six) hours as needed for anxiety., Disp: 30 tablet, Rfl: 0 .  oxyCODONE-acetaminophen (ROXICET) 5-325 MG tablet, Take 1 tablet by mouth daily as needed for severe pain. (Patient not taking: Reported on 02/25/2018), Disp: 30 tablet, Rfl: 0 .  tiZANidine (ZANAFLEX) 4 MG capsule, Take 1 capsule (4 mg total) by mouth 3 (three) times daily., Disp: 90 capsule, Rfl: 5  PAST MEDICAL HISTORY: Past Medical History:  Diagnosis Date  . Adrenal adenoma   . Allergy   . Anemia   . Anxiety   . CAD (coronary artery disease)    50-60% mid LAD at cardiac catheterization 2014 [facility])  . Cataract   . Depression   . DJD (degenerative joint disease) of cervical spine 09/12/2016  . History of cardiomyopathy   . History of tobacco use   . Hyperlipidemia   . Neuromuscular disorder ([place])   . PAF (paroxysmal  atrial fibrillation) [facility])    Diagnosis July 2017 - spontaneously resolved  . Statin intolerance     PAST SURGICAL HISTORY: Past Surgical History:  Procedure Laterality Date  . ABDOMINAL HYSTERECTOMY  1980   endometriosis  . TONSILLECTOMY  1954    FAMILY HISTORY: Family History  Problem Relation Age of Onset  . COPD Mother   . Hearing loss Mother   . Stroke Mother   . Heart disease Mother        chf, died at 67  . Arthritis Father   . Hypertension Father   . Heart disease Father        cad, pvd, died at 35  . Heart disease Sister        heart valve  . Arthritis Sister   . Hyperlipidemia Brother   . Hypertension Brother   . Cancer - Prostate Brother  SOCIAL HISTORY:  Social History   Socioeconomic History  . Marital status: Married    Spouse name: Al  . Number of children: 0  . Years of education: 59  . Highest education level: Not on file  Occupational History  . 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 Financial resource strain: Not on file  . Food insecurity:    Worry: Not on file    Inability: Not on file  . Transportation needs:    Medical: Not on file    Non-medical: Not on file  Tobacco Use  . Smoking status: Former Smoker    Packs/day: 0.00    Types: Cigarettes    Start date: 11/12/1965    Last attempt to quit: 09/06/2008    Years since quitting: 9.4  . Smokeless tobacco: Never Used  Substance and Sexual Activity  . Alcohol use: Yes    Alcohol/week: 0.6 oz    Types: 1 Glasses of wine per week    Comment: occ  . Drug use: Yes    Types: Marijuana    Comment: rarely  . Sexual activity: Yes    Birth control/protection: Post-menopausal  Lifestyle  . Physical activity:    Days per week: Not on file    Minutes per session: Not on file  . Stress: Not on file  Relationships  . Social connections:    Talks on phone: Not on file    Gets together: Not on file    Attends religious service: Not on file    Active member of  club or organization: Not on file    Attends meetings of clubs or organizations: Not on file    Relationship status: Not on file  . Intimate partner violence:    Fear of current or ex partner: Not on file    Emotionally abused: Not on file    Physically abused: Not on file    Forced sexual activity: Not on file  Other Topics Concern  . 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   General: The patient is well-developed and well-nourished and in no acute distress   Musculoskeletal:  Back is mildly tender in the right lower thoracic intracostal region / paraspinal region.  Neurologic Exam  Mental status: The patient is alert and oriented x 3 at the time of the examination. The patient has apparent normal recent and remote memory, with an apparently normal attention span and concentration ability.   Speech is normal.  Cranial nerves: Extraocular muscles are intact.  Facial strength is normal.  Trapezius strength is strong the tongue is midline, and the patient has symmetric elevation of the soft palate. No obvious hearing deficits are noted.  Motor:  Muscle bulk is normal.   Tone is normal. Strength is  5 / 5 in all 4 extremities.   Sensory: She has intact sensation to touch x4  Coordination: Cerebellar testing shows good finger-nose-finger.  Gait and station: Station is normal.   Gait is normal. Tandem gait is normal.     DIAGNOSTIC DATA (LABS, IMAGING, TESTING) - I reviewed patient records, labs,  notes, testing and imaging myself where available.      ASSESSMENT AND PLAN  Myofascial pain syndrome  Muscle spasm  Chronic right-sided thoracic back pain    1.  Valium 5-10 mg as needed severe muscle spasm.  We  discussed Botox therapy every 3 months to reduce the spasticity.   2.   Stay active and exercise as tolerated 3.   She will return to see me as needed for new or worsening neurologic symptoms.   Jesyka Slaght A. Felecia Shelling, MD, PhD 0/37/0488, 8:91 PM Certified in Neurology, Clinical Neurophysiology, Sleep Medicine, Pain Medicine and Neuroimaging  [facility] [address] [place], [place] [address] [phone]

## 2018-03-12 ENCOUNTER — Ambulatory Visit (INDEPENDENT_AMBULATORY_CARE_PROVIDER_SITE_OTHER): Payer: Medicare Other | Admitting: *Deleted

## 2018-03-12 DIAGNOSIS — Z5181 Encounter for therapeutic drug level monitoring: Secondary | ICD-10-CM | POA: Diagnosis not present

## 2018-03-12 DIAGNOSIS — I480 Paroxysmal atrial fibrillation: Secondary | ICD-10-CM | POA: Diagnosis not present

## 2018-03-12 LAB — POCT INR: INR: 1.7

## 2018-03-12 NOTE — Patient Instructions (Signed)
Increase coumadin to 2 tablets daily except 1 tablet on Mondays and Fridays.   Recheck in 3 weeks Continue greens

## 2018-04-08 DIAGNOSIS — R6882 Decreased libido: Secondary | ICD-10-CM | POA: Diagnosis not present

## 2018-04-08 DIAGNOSIS — S80862A Insect bite (nonvenomous), left lower leg, initial encounter: Secondary | ICD-10-CM | POA: Diagnosis not present

## 2018-04-08 DIAGNOSIS — R5383 Other fatigue: Secondary | ICD-10-CM | POA: Diagnosis not present

## 2018-04-08 DIAGNOSIS — E785 Hyperlipidemia, unspecified: Secondary | ICD-10-CM | POA: Diagnosis not present

## 2018-04-08 DIAGNOSIS — D225 Melanocytic nevi of trunk: Secondary | ICD-10-CM | POA: Diagnosis not present

## 2018-04-08 DIAGNOSIS — Z1283 Encounter for screening for malignant neoplasm of skin: Secondary | ICD-10-CM | POA: Diagnosis not present

## 2018-04-11 ENCOUNTER — Ambulatory Visit (INDEPENDENT_AMBULATORY_CARE_PROVIDER_SITE_OTHER): Payer: Medicare Other | Admitting: *Deleted

## 2018-04-11 DIAGNOSIS — Z5181 Encounter for therapeutic drug level monitoring: Secondary | ICD-10-CM

## 2018-04-11 DIAGNOSIS — I480 Paroxysmal atrial fibrillation: Secondary | ICD-10-CM

## 2018-04-11 LAB — POCT INR: INR: 2.3 (ref 2.0–3.0)

## 2018-04-11 NOTE — Patient Instructions (Signed)
Continue coumadin 2 tablets daily except 1 tablet on Mondays and Fridays.   Recheck in 4 weeks Continue greens

## 2018-04-14 DIAGNOSIS — Z0001 Encounter for general adult medical examination with abnormal findings: Secondary | ICD-10-CM | POA: Diagnosis not present

## 2018-04-14 DIAGNOSIS — Z131 Encounter for screening for diabetes mellitus: Secondary | ICD-10-CM | POA: Diagnosis not present

## 2018-04-14 DIAGNOSIS — R6882 Decreased libido: Secondary | ICD-10-CM | POA: Diagnosis not present

## 2018-04-14 DIAGNOSIS — R5383 Other fatigue: Secondary | ICD-10-CM | POA: Diagnosis not present

## 2018-04-14 DIAGNOSIS — E118 Type 2 diabetes mellitus with unspecified complications: Secondary | ICD-10-CM | POA: Diagnosis not present

## 2018-04-14 DIAGNOSIS — E559 Vitamin D deficiency, unspecified: Secondary | ICD-10-CM | POA: Diagnosis not present

## 2018-04-14 DIAGNOSIS — E785 Hyperlipidemia, unspecified: Secondary | ICD-10-CM | POA: Diagnosis not present

## 2018-04-14 DIAGNOSIS — Z7689 Persons encountering health services in other specified circumstances: Secondary | ICD-10-CM | POA: Diagnosis not present

## 2018-04-29 ENCOUNTER — Telehealth: Payer: Self-pay | Admitting: Neurology

## 2018-04-29 NOTE — Telephone Encounter (Signed)
Received Botox form on 06/14 while I was out of office. I called the patient today to schedule apt.

## 2018-05-12 ENCOUNTER — Ambulatory Visit (INDEPENDENT_AMBULATORY_CARE_PROVIDER_SITE_OTHER): Payer: Medicare Other | Admitting: *Deleted

## 2018-05-12 DIAGNOSIS — I480 Paroxysmal atrial fibrillation: Secondary | ICD-10-CM | POA: Diagnosis not present

## 2018-05-12 DIAGNOSIS — Z5181 Encounter for therapeutic drug level monitoring: Secondary | ICD-10-CM

## 2018-05-12 DIAGNOSIS — G459 Transient cerebral ischemic attack, unspecified: Secondary | ICD-10-CM | POA: Diagnosis not present

## 2018-05-12 LAB — POCT INR: INR: 2.9 (ref 2.0–3.0)

## 2018-05-12 NOTE — Patient Instructions (Signed)
Continue coumadin 2 tablets daily except 1 tablet on Mondays and Fridays.   Recheck in 6 weeks Continue greens 

## 2018-05-22 ENCOUNTER — Encounter: Payer: Self-pay | Admitting: Neurology

## 2018-05-22 ENCOUNTER — Other Ambulatory Visit: Payer: Self-pay

## 2018-05-22 ENCOUNTER — Ambulatory Visit (INDEPENDENT_AMBULATORY_CARE_PROVIDER_SITE_OTHER): Payer: Medicare Other | Admitting: Neurology

## 2018-05-22 DIAGNOSIS — M7918 Myalgia, other site: Secondary | ICD-10-CM

## 2018-05-22 DIAGNOSIS — M62838 Other muscle spasm: Secondary | ICD-10-CM

## 2018-05-22 MED ORDER — OXYCODONE-ACETAMINOPHEN 5-325 MG PO TABS: 1.0000 | ORAL_TABLET | Freq: Every day | ORAL | 0 refills | Status: DC | PRN

## 2018-05-22 NOTE — Progress Notes (Signed)
[facility]  PATIENT: Heather [name] DOB: [birth year]  REFERRING DOCTOR OR PCP:  Blanchie Serve SOURCE:   _________________________________   HISTORICAL  CHIEF COMPLAINT:  Chief Complaint  Patient presents with  . Myofascial Pain Syndrome    Botox for Myofascial Pain Syndrome.  100u, 1 vial. Buy and Bill.  Lot# [id number]. Exp. 04/2020.  Requests r/f of Oxycodone/fim    HISTORY OF PRESENT ILLNESS:  Heather [name] is a 72 year old woman who has had chronic right-sided mid back pain since 2013.      Update 05/22/2018: She is here today for her first Botox injections for myofascial pain in the right intracoastal region (T7-T9)  Update 02/25/2018: Her right sided thoracic pain flared up a week ago and was intense x 2-3 days.   Opioids make her feel sick. Diazepam 10 mg, Ibu and MJ helped her pain.    Currently pain is much better (improved over last 24 hours).   She also used the [employer] patch.     The first TPI we did helped her pain but the more recent one made pain worse.     She is wondering about Botox.    We discussed it in more detail.She  Is on coumadin for Afib.     From 05/01/2017: Her pain is about 1 inch right of midline in the lower thoracic paraspinal region. 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 Pain worsens often with household chores or gardening. The pain may improve with using a TENS unit, a trigger point or Percocet. Heating pad also sometimes helps. When pain is more severe she has smoked marijuana with some benefit. Cannabinol oil did not help. Flexeril did not help. More recently she took a tizanidine and was very sleepy though pain was better that day. More recently, she took a tizanidine 4 mg but fell asleep 1-2 hours.  The trigger point injection at the last visit (thoracic paraspinal) did not help.   She denies any numbness or weakness in her arms or legs.   No bowel or bladder changes.    She is on  coumadin for recently diagnose atrial fibrillation.      She was getting trigger point injections every 3-6 months for most of the past 4 years but needed more frequently last year when she was packing up to move to this area.        I reviewed her MRI results from 10/14/2013. The MRI of the brain showed age related atrophy and minimal chronic buccal vascular ischemic change. MRI of the cervical spine showed multilevel mild degenerative changes with left paramedian disc herniation at C3-C4 and left disc protrusion at C4-C5 and C5-C6 and midline disc herniation at C6-C7. There was no report of nerve root compression. MRI of the thoracic spine showed disc desiccation but no herniation or protrusions. MRI of the lumbar spine showed disc bulges at T12-L1 and L2-L3 and disc bulge with facet hypertrophy at L3-L4 and disc bulge with right foraminal annular tear at L4-L5.   REVIEW OF SYSTEMS: Constitutional: No fevers, chills, sweats, or change in appetite Eyes: No visual changes, double vision, eye pain Ear, nose and throat: No hearing loss, ear pain, nasal congestion, sore throat Cardiovascular: No chest pain, palpitations.   She has recently diagnose atrial fibrillation. Respiratory: No shortness of breath at rest or with exertion.   No wheezes GastrointestinaI: No nausea, vomiting, diarrhea, abdominal pain, fecal incontinence Genitourinary: No dysuria, urinary retention or  frequency.  No nocturia. Musculoskeletal: No neck pain, back pain/thoracic pain as above Integumentary: No rash, pruritus, skin lesions Neurological: as above Psychiatric: No depression at this time.  No anxiety Endocrine: No palpitations, diaphoresis, change in appetite, change in weigh or increased thirst Hematologic/Lymphatic: No anemia, purpura, petechiae. Allergic/Immunologic: No itchy/runny eyes, nasal congestion, recent allergic reactions, rashes  ALLERGIES: Allergies  Allergen Reactions  . Statins Other (See  Comments)    Intolerant all statins  . Sulfa Antibiotics Rash    HOME MEDICATIONS:  Current Outpatient Medications:  .  Biotin 10000 MCG TABS, Take by mouth., Disp: , Rfl:  .  buPROPion (WELLBUTRIN SR) 150 MG 12 hr tablet, Take 1 tablet (150 mg total) by mouth 2 (two) times daily., Disp: 180 tablet, Rfl: 1 .  co-enzyme Q-10 30 MG capsule, Take 100 mg by mouth daily. , Disp: , Rfl:  .  diazepam (VALIUM) 5 MG tablet, Take 1 tablet (5 mg total) by mouth every 6 (six) hours as needed for anxiety., Disp: 30 tablet, Rfl: 0 .  docusate sodium (COLACE) 100 MG capsule, Take 100 mg by mouth daily., Disp: , Rfl:  .  estradiol (ESTRACE) 1 MG tablet, Take 1 tablet (1 mg total) by mouth daily., Disp: 30 tablet, Rfl: 11 .  Multiple Vitamin (MULTIVITAMIN) tablet, Take 1 tablet by mouth daily., Disp: , Rfl:  .  oxyCODONE-acetaminophen (ROXICET) 5-325 MG tablet, Take 1 tablet by mouth daily as needed for severe pain., Disp: 30 tablet, Rfl: 0 .  tiZANidine (ZANAFLEX) 4 MG capsule, Take 1 capsule (4 mg total) by mouth 3 (three) times daily., Disp: 90 capsule, Rfl: 5 .  traMADol (ULTRAM) 50 MG tablet, Take 1 tablet (50 mg total) by mouth daily as needed., Disp: 30 tablet, Rfl: 0 .  warfarin (COUMADIN) 5 MG tablet, TAKE TWO TABLETS BY MOUTH ONCE DAILY OR AS DIRECTED, Disp: 180 tablet, Rfl: 1  PAST MEDICAL HISTORY: Past Medical History:  Diagnosis Date  . Adrenal adenoma   . Allergy   . Anemia   . Anxiety   . CAD (coronary artery disease)    50-60% mid LAD at cardiac catheterization 2014 [facility])  . Cataract   . Depression   . DJD (degenerative joint disease) of cervical spine 09/12/2016  . History of cardiomyopathy   . History of tobacco use   . Hyperlipidemia   . Neuromuscular disorder ([place])   . PAF (paroxysmal atrial fibrillation) [facility])    Diagnosis July 2017 - spontaneously resolved  . Statin intolerance     PAST SURGICAL HISTORY: Past Surgical History:  Procedure Laterality Date  .  ABDOMINAL HYSTERECTOMY  1980   endometriosis  . TONSILLECTOMY  1954    FAMILY HISTORY: Family History  Problem Relation Age of Onset  . COPD Mother   . Hearing loss Mother   . Stroke Mother   . Heart disease Mother        chf, died at 36  . Arthritis Father   . Hypertension Father   . Heart disease Father        cad, pvd, died at 24  . Heart disease Sister        heart valve  . Arthritis Sister   . Hyperlipidemia Brother   . Hypertension Brother   . Cancer - Prostate Brother     SOCIAL HISTORY:  Social History   Socioeconomic History  . Marital status: Married    Spouse name: Al  . Number of children: 0  . Years  of education: 78  . Highest education level: Not on file  Occupational History  . 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 Financial resource strain: Not on file  . Food insecurity:    Worry: Not on file    Inability: Not on file  . Transportation needs:    Medical: Not on file    Non-medical: Not on file  Tobacco Use  . Smoking status: Former Smoker    Packs/day: 0.00    Types: Cigarettes    Start date: 11/12/1965    Last attempt to quit: 09/06/2008    Years since quitting: 9.7  . Smokeless tobacco: Never Used  Substance and Sexual Activity  . Alcohol use: Yes    Alcohol/week: 0.6 oz    Types: 1 Glasses of wine per week    Comment: occ  . Drug use: Yes    Types: Marijuana    Comment: rarely  . Sexual activity: Yes    Birth control/protection: Post-menopausal  Lifestyle  . Physical activity:    Days per week: Not on file    Minutes per session: Not on file  . Stress: Not on file  Relationships  . Social connections:    Talks on phone: Not on file    Gets together: Not on file    Attends religious service: Not on file    Active member of club or organization: Not on file    Attends meetings of clubs or organizations: Not on file    Relationship status: Not on file  . Intimate partner violence:    Fear of current or ex  partner: Not on file    Emotionally abused: Not on file    Physically abused: Not on file    Forced sexual activity: Not on file  Other Topics Concern  . 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  There is no height or weight on file to calculate BMI.   General: The patient is well-developed and well-nourished and in no acute distress   Musculoskeletal:  Back is tender in the right lower thoracic intracostal region / paraspinal region.  Neurologic Exam  Mental status: The patient is alert and oriented x 3 at the time of the examination. The patient has apparent normal recent and remote memory, with an apparently normal attention span and concentration ability.   Speech is normal.  Cranial nerves: Extraocular muscles are intact.  Facial strength is normal.   . No obvious hearing deficits are noted.  Motor:  Muscle bulk is normal.   Tone is normal. Strength is  5 / 5 in all 4 extremities.    Gait and station: Station is normal.   Gait is normal.      DIAGNOSTIC DATA (LABS, IMAGING, TESTING) - I reviewed patient records, labs, notes, testing and imaging myself where available.      ASSESSMENT AND PLAN  Myofascial pain syndrome    1.  100 units of Botox split between the tender points at T7-T8 and T8-T9 into intracostal space 2.   Stay active and exercise as tolerated 3.   Renew Percocet she will return to see me as needed for new or worsening neurologic symptoms.   Kortney Potvin A. Felecia Shelling, MD, PhD 5/85/2778, 2:42 PM Certified in Neurology,  Clinical Neurophysiology, Sleep Medicine, Pain Medicine and Neuroimaging  [facility] [address], [place], [place] [address] [phone]

## 2018-05-26 DIAGNOSIS — R5383 Other fatigue: Secondary | ICD-10-CM | POA: Diagnosis not present

## 2018-05-26 DIAGNOSIS — E559 Vitamin D deficiency, unspecified: Secondary | ICD-10-CM | POA: Diagnosis not present

## 2018-05-26 DIAGNOSIS — R6882 Decreased libido: Secondary | ICD-10-CM | POA: Diagnosis not present

## 2018-05-26 DIAGNOSIS — E785 Hyperlipidemia, unspecified: Secondary | ICD-10-CM | POA: Diagnosis not present

## 2018-05-27 NOTE — Telephone Encounter (Signed)
I called to schedule patients follow up injection, I spoke with her and scheduled the apt for October. DW

## 2018-06-23 ENCOUNTER — Telehealth: Payer: Self-pay | Admitting: *Deleted

## 2018-06-23 NOTE — Telephone Encounter (Signed)
Pt has forgot her dosing instructions, please give her a call

## 2018-06-23 NOTE — Telephone Encounter (Signed)
Pt called to confirm coumadin dosing, she had her dosing correct

## 2018-07-04 ENCOUNTER — Ambulatory Visit (INDEPENDENT_AMBULATORY_CARE_PROVIDER_SITE_OTHER): Payer: Medicare Other | Admitting: *Deleted

## 2018-07-04 DIAGNOSIS — Z5181 Encounter for therapeutic drug level monitoring: Secondary | ICD-10-CM | POA: Diagnosis not present

## 2018-07-04 DIAGNOSIS — I480 Paroxysmal atrial fibrillation: Secondary | ICD-10-CM

## 2018-07-04 LAB — POCT INR: INR: 2.8 (ref 2.0–3.0)

## 2018-07-04 NOTE — Patient Instructions (Signed)
Description   Continue coumadin 2 tablets daily except 1 tablet on Mondays and Fridays.   Recheck in 6 weeks Continue greens

## 2018-07-29 DIAGNOSIS — E559 Vitamin D deficiency, unspecified: Secondary | ICD-10-CM | POA: Diagnosis not present

## 2018-07-29 DIAGNOSIS — R6882 Decreased libido: Secondary | ICD-10-CM | POA: Diagnosis not present

## 2018-07-29 DIAGNOSIS — E785 Hyperlipidemia, unspecified: Secondary | ICD-10-CM | POA: Diagnosis not present

## 2018-07-29 DIAGNOSIS — R5383 Other fatigue: Secondary | ICD-10-CM | POA: Diagnosis not present

## 2018-08-12 ENCOUNTER — Other Ambulatory Visit: Payer: Self-pay | Admitting: Cardiology

## 2018-08-27 ENCOUNTER — Ambulatory Visit (INDEPENDENT_AMBULATORY_CARE_PROVIDER_SITE_OTHER): Payer: Medicare Other | Admitting: Neurology

## 2018-08-27 ENCOUNTER — Other Ambulatory Visit: Payer: Self-pay

## 2018-08-27 ENCOUNTER — Telehealth: Payer: Self-pay | Admitting: Neurology

## 2018-08-27 DIAGNOSIS — I480 Paroxysmal atrial fibrillation: Secondary | ICD-10-CM | POA: Diagnosis not present

## 2018-08-27 DIAGNOSIS — M7918 Myalgia, other site: Secondary | ICD-10-CM | POA: Diagnosis not present

## 2018-08-27 DIAGNOSIS — M62838 Other muscle spasm: Secondary | ICD-10-CM | POA: Diagnosis not present

## 2018-08-27 DIAGNOSIS — I25810 Atherosclerosis of coronary artery bypass graft(s) without angina pectoris: Secondary | ICD-10-CM | POA: Diagnosis not present

## 2018-08-27 DIAGNOSIS — M546 Pain in thoracic spine: Secondary | ICD-10-CM | POA: Diagnosis not present

## 2018-08-27 DIAGNOSIS — G8929 Other chronic pain: Secondary | ICD-10-CM

## 2018-08-27 MED ORDER — OXYCODONE-ACETAMINOPHEN 5-325 MG PO TABS: 1.0000 | ORAL_TABLET | Freq: Every day | ORAL | 0 refills | Status: DC | PRN

## 2018-08-27 NOTE — Telephone Encounter (Signed)
3 MOS BTX 100 U

## 2018-08-27 NOTE — Progress Notes (Signed)
[facility]  PATIENT: Heather [name] DOB: 07/29/1946  REFERRING DOCTOR OR PCP:  Blanchie Serve SOURCE:   _________________________________   HISTORICAL  CHIEF COMPLAINT:  Chief Complaint  Patient presents with  . Myofascial Pain Syndrome    Botox 100u, 1 vial. [employer]. Lot# [id number]. Exp. 02/2021. [place] 0023-1145-01/fim    HISTORY OF PRESENT ILLNESS:  Heather [name] is a 72 y.o. woman who has had chronic right-sided mid back pain since 2013.      Update 08/27/2018: She reports that the back pain is more than 50% better and the episodes of more significant pain have been significantly reduced.  She tolerated the injections without any problems.  Most of her pain is in the right intracoastal region from T6-T9.     When pain is more severe, she will take a Percocet.  She does not take more than 2 or 3 a week  She is on Warfarin for chronic Afib.     Update 05/22/2018: She is here today for her first Botox injections for myofascial pain in the right intracoastal region (T7-T9)  Update 02/25/2018: Her right sided thoracic pain flared up a week ago and was intense x 2-3 days.   Opioids make her feel sick. Diazepam 10 mg, Ibu and MJ helped her pain.    Currently pain is much better (improved over last 24 hours).   She also used the [employer] patch.     The first TPI we did helped her pain but the more recent one made pain worse.     She is wondering about Botox.    We discussed it in more detail.She  Is on coumadin for Afib.     From 05/01/2017: Her pain is about 1 inch right of midline in the lower thoracic paraspinal region. 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 Pain worsens often with household chores or gardening. The pain may improve with using a TENS unit, a trigger point or Percocet. Heating pad also sometimes helps. When pain is more severe she has smoked marijuana with some benefit. Cannabinol oil did not help. Flexeril did not  help. More recently she took a tizanidine and was very sleepy though pain was better that day. More recently, she took a tizanidine 4 mg but fell asleep 1-2 hours.  The trigger point injection at the last visit (thoracic paraspinal) did not help.   She denies any numbness or weakness in her arms or legs.   No bowel or bladder changes.    She is on coumadin for recently diagnose atrial fibrillation.      She was getting trigger point injections every 3-6 months for most of the past 4 years but needed more frequently last year when she was packing up to move to this area.        I reviewed her MRI results from 10/14/2013. The MRI of the brain showed age related atrophy and minimal chronic buccal vascular ischemic change. MRI of the cervical spine showed multilevel mild degenerative changes with left paramedian disc herniation at C3-C4 and left disc protrusion at C4-C5 and C5-C6 and midline disc herniation at C6-C7. There was no report of nerve root compression. MRI of the thoracic spine showed disc desiccation but no herniation or protrusions. MRI of the lumbar spine showed disc bulges at T12-L1 and L2-L3 and disc bulge with facet hypertrophy at L3-L4 and disc bulge with right foraminal annular tear at L4-L5.   REVIEW OF  SYSTEMS: Constitutional: No fevers, chills, sweats, or change in appetite Eyes: No visual changes, double vision, eye pain Ear, nose and throat: No hearing loss, ear pain, nasal congestion, sore throat Cardiovascular: No chest pain, palpitations.   She has atrial fibrillation and is on warfarin Respiratory: No shortness of breath at rest or with exertion.   No wheezes GastrointestinaI: No nausea, vomiting, diarrhea, abdominal pain, fecal incontinence Genitourinary: No dysuria, urinary retention or frequency.  No nocturia. Musculoskeletal: No neck pain.  She has back pain/thoracic pain as above Integumentary: No rash, pruritus, skin lesions Neurological: as above Psychiatric:  No depression at this time.  No anxiety Endocrine: No palpitations, diaphoresis, change in appetite, change in weigh or increased thirst Hematologic/Lymphatic: No anemia, purpura, petechiae. Allergic/Immunologic: No itchy/runny eyes, nasal congestion, recent allergic reactions, rashes  ALLERGIES: Allergies  Allergen Reactions  . Statins Other (See Comments)    Intolerant all statins  . Sulfa Antibiotics Rash    HOME MEDICATIONS:  Current Outpatient Medications:  .  Biotin 10000 MCG TABS, Take by mouth., Disp: , Rfl:  .  buPROPion (WELLBUTRIN SR) 150 MG 12 hr tablet, Take 1 tablet (150 mg total) by mouth 2 (two) times daily., Disp: 180 tablet, Rfl: 1 .  co-enzyme Q-10 30 MG capsule, Take 100 mg by mouth daily. , Disp: , Rfl:  .  docusate sodium (COLACE) 100 MG capsule, Take 100 mg by mouth daily., Disp: , Rfl:  .  estradiol (ESTRACE) 0.5 MG tablet, Take 1.5 mg by mouth daily., Disp: , Rfl:  .  NP THYROID 60 MG tablet, Take 60 mg by mouth daily., Disp: , Rfl: 3 .  oxyCODONE-acetaminophen (ROXICET) 5-325 MG tablet, Take 1 tablet by mouth daily as needed for severe pain., Disp: 30 tablet, Rfl: 0 .  progesterone (PROMETRIUM) 200 MG capsule, Take 200 mg by mouth daily., Disp: , Rfl:  .  Testosterone 20 % CREA, by Does not apply route., Disp: , Rfl:  .  warfarin (COUMADIN) 5 MG tablet, Take 2 tablets daily except 1 tablet on Mondays and Fridays or as directed, Disp: 180 tablet, Rfl: 3  PAST MEDICAL HISTORY: Past Medical History:  Diagnosis Date  . Adrenal adenoma   . Allergy   . Anemia   . Anxiety   . CAD (coronary artery disease)    50-60% mid LAD at cardiac catheterization 2014 [facility])  . Cataract   . Depression   . DJD (degenerative joint disease) of cervical spine 09/12/2016  . History of cardiomyopathy   . History of tobacco use   . Hyperlipidemia   . Neuromuscular disorder ([place])   . PAF (paroxysmal atrial fibrillation) [facility])    Diagnosis July 2017 - spontaneously  resolved  . Statin intolerance     PAST SURGICAL HISTORY: Past Surgical History:  Procedure Laterality Date  . ABDOMINAL HYSTERECTOMY  1980   endometriosis  . TONSILLECTOMY  1954    FAMILY HISTORY: Family History  Problem Relation Age of Onset  . COPD Mother   . Hearing loss Mother   . Stroke Mother   . Heart disease Mother        chf, died at 8  . Arthritis Father   . Hypertension Father   . Heart disease Father        cad, pvd, died at 99  . Heart disease Sister        heart valve  . Arthritis Sister   . Hyperlipidemia Brother   . Hypertension Brother   . Cancer -  Prostate Brother     SOCIAL HISTORY:  Social History   Socioeconomic History  . Marital status: Married    Spouse name: Al  . Number of children: 0  . Years of education: 41  . Highest education level: Not on file  Occupational History  . 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 Financial resource strain: Not on file  . Food insecurity:    Worry: Not on file    Inability: Not on file  . Transportation needs:    Medical: Not on file    Non-medical: Not on file  Tobacco Use  . Smoking status: Former Smoker    Packs/day: 0.00    Types: Cigarettes    Start date: 11/12/1965    Last attempt to quit: 09/06/2008    Years since quitting: 9.9  . Smokeless tobacco: Never Used  Substance and Sexual Activity  . Alcohol use: Yes    Alcohol/week: 1.0 standard drinks    Types: 1 Glasses of wine per week    Comment: occ  . Drug use: Yes    Types: Marijuana    Comment: rarely  . Sexual activity: Yes    Birth control/protection: Post-menopausal  Lifestyle  . Physical activity:    Days per week: Not on file    Minutes per session: Not on file  . Stress: Not on file  Relationships  . Social connections:    Talks on phone: Not on file    Gets together: Not on file    Attends religious service: Not on file    Active member of club or organization: Not on file    Attends meetings  of clubs or organizations: Not on file    Relationship status: Not on file  . Intimate partner violence:    Fear of current or ex partner: Not on file    Emotionally abused: Not on file    Physically abused: Not on file    Forced sexual activity: Not on file  Other Topics Concern  . 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  There is no height or weight on file to calculate BMI.   General: The patient is well-developed and well-nourished and in no acute distress   Musculoskeletal: Thoracic back is tender in the right lower thoracic intracostal region / paraspinal region.  Neurologic Exam  Mental status: The patient is alert and oriented x 3 at the time of the examination. The patient has apparent normal recent and remote memory, with an apparently normal attention span and concentration ability.   Speech is normal.  Cranial nerves: Extraocular muscles are intact.  Facial strength is normal.   . No obvious hearing deficits are noted.  Motor:  Muscle bulk is normal.   Tone is normal. Strength is  5 / 5 in her muscles..    Gait and station: Station is normal.  Gait is normal.    DIAGNOSTIC DATA (LABS, IMAGING, TESTING) - I reviewed patient records, labs, notes, testing and imaging myself where available.      ASSESSMENT AND PLAN  Myofascial pain syndrome  Chronic right-sided thoracic back pain  Muscle spasm  PAF (paroxysmal atrial fibrillation) ([facility])    1.  100 units of Botox split between the tender points below  the T6 to below the T9 ribs into intracostal space  2.   Stay active and exercise as tolerated 3.   Renew Percocet. 4.   she will return in 3 months or sooner if  new or worsening neurologic symptoms.   Richard A. Felecia Shelling, MD, PhD 42/39/5320, 2:33 PM Certified in Neurology, Clinical Neurophysiology, Sleep  Medicine, Pain Medicine and Neuroimaging  [facility] [address], [place], [place] [address] [phone]

## 2018-08-29 NOTE — Telephone Encounter (Signed)
I called and scheduled the patient.  °

## 2018-09-03 ENCOUNTER — Ambulatory Visit (INDEPENDENT_AMBULATORY_CARE_PROVIDER_SITE_OTHER): Payer: Medicare Other | Admitting: *Deleted

## 2018-09-03 DIAGNOSIS — Z5181 Encounter for therapeutic drug level monitoring: Secondary | ICD-10-CM | POA: Diagnosis not present

## 2018-09-03 DIAGNOSIS — I480 Paroxysmal atrial fibrillation: Secondary | ICD-10-CM | POA: Diagnosis not present

## 2018-09-03 LAB — POCT INR: INR: 1.6 — AB (ref 2.0–3.0)

## 2018-09-03 NOTE — Patient Instructions (Signed)
Take coumadin 3 tablets tonight and tomorrow night then resume 2 tablets daily except 1 tablet on Mondays and Fridays.   Recheck in 3 weeks Continue greens

## 2018-09-04 NOTE — Progress Notes (Signed)
Cardiology Office Note  Date: 09/05/2018   ID: Heather [name], DOB 20-Aug-1946, MRN [id number]  PCP: Doree Albee, MD  Primary Cardiologist: Rozann Lesches, MD   Chief Complaint  Patient presents with  . PAF    History of Present Illness: Heather [name] is a 72 y.o. female last seen in February.  She presents for a routine follow-up visit.  She tells me that since September she has been feeling less energy with activities, has not been able to exercise or dance to the degree that she was before.  She sometimes feels a tightness in her chest and shortness of breath.  She has not experienced any progressive palpitations however.  Also reports no bleeding problems on Coumadin.  She remains on Coumadin with follow-up in the anticoagulation clinic.  Last INR was 1.6 down from 2.8.  She is concerned about the status of her coronary artery disease.  She had moderate mid LAD stenosis by cardiac catheterization in 2014.  We discussed proceeding with an exercise Myoview for further evaluation.  She also has familial hyperlipidemia with significant statin intolerance.  I have talked with her about other options such as Repatha and we will ask the lipid clinic about whether she might qualify for any type of assistance plan.  She does not have prescription health coverage.  Also requesting most recent lipid panel and CBC from Dr. Anastasio Champion.  Past Medical History:  Diagnosis Date  . Adrenal adenoma   . Allergy   . Anemia   . Anxiety   . CAD (coronary artery disease)    50-60% mid LAD at cardiac catheterization 2014 [facility])  . Cataract   . Depression   . DJD (degenerative joint disease) of cervical spine 09/12/2016  . History of cardiomyopathy   . History of tobacco use   . Hyperlipidemia   . Neuromuscular disorder ([place])   . PAF (paroxysmal atrial fibrillation) [facility])    Diagnosis July 2017 - spontaneously resolved  . Statin intolerance     Past Surgical History:  Procedure  Laterality Date  . ABDOMINAL HYSTERECTOMY  1980   endometriosis  . TONSILLECTOMY  1954    Current Outpatient Medications  Medication Sig Dispense Refill  . Biotin 10000 MCG TABS Take by mouth.    Marland Kitchen buPROPion (WELLBUTRIN SR) 150 MG 12 hr tablet Take 1 tablet (150 mg total) by mouth 2 (two) times daily. 180 tablet 1  . co-enzyme Q-10 30 MG capsule Take 100 mg by mouth daily.     Marland Kitchen docusate sodium (COLACE) 100 MG capsule Take 100 mg by mouth daily.    Marland Kitchen estradiol (ESTRACE) 0.5 MG tablet Take 1.5 mg by mouth daily.    . NP THYROID 60 MG tablet Take 60 mg by mouth daily.  3  . oxyCODONE-acetaminophen (ROXICET) 5-325 MG tablet Take 1 tablet by mouth daily as needed for severe pain. 30 tablet 0  . progesterone (PROMETRIUM) 200 MG capsule Take 200 mg by mouth daily.    . Testosterone 20 % CREA by Does not apply route.    . warfarin (COUMADIN) 5 MG tablet Take 2 tablets daily except 1 tablet on Mondays and Fridays or as directed 180 tablet 3   No current facility-administered medications for this visit.    Allergies:  Statins and Sulfa antibiotics   Social History: The patient  reports that she quit smoking about 10 years ago. Her smoking use included cigarettes. She started smoking about 52 years ago. She smoked 0.00 packs  per day. She has never used smokeless tobacco. She reports that she drinks about 1.0 standard drinks of alcohol per week. She reports that she has current or past drug history. Drug: Marijuana.   ROS:  Please see the history of present illness. Otherwise, complete review of systems is positive for none.  All other systems are reviewed and negative.   Physical Exam: VS:  BP 104/70   Pulse 63   Ht 5\' 4"  (1.626 m)   Wt 159 lb (72.1 kg)   LMP 07/08/1979 (Approximate)   SpO2 98%   BMI 27.29 kg/m , BMI Body mass index is 27.29 kg/m.  Wt Readings from Last 3 Encounters:  09/05/18 159 lb (72.1 kg)  02/25/18 154 lb 8 oz (70.1 kg)  02/07/18 154 lb 1.9 oz (69.9 kg)      General: Patient appears comfortable at rest. HEENT: Conjunctiva and lids normal, oropharynx clear. Neck: Supple, no elevated JVP or carotid bruits, no thyromegaly. Lungs: Clear to auscultation, nonlabored breathing at rest. Cardiac: Regular rate and rhythm, no S3 or significant systolic murmur. Abdomen: Soft, nontender, bowel sounds present, no guarding or rebound. Extremities: No pitting edema, distal pulses 2+. Skin: Warm and dry. Musculoskeletal: No kyphosis. Neuropsychiatric: Alert and oriented x3, affect grossly appropriate.  ECG: I personally reviewed the tracing from 12/17/2017 which showed sinus bradycardia with nonspecific ST-T changes.  Recent Labwork:  October 2017: Cholesterol 385, triglycerides 78, HDL 81, LDL 288, ALT 14, AST 15, BUN 17, creatinine 0.81  Other Studies Reviewed Today:  Cardiac catheterization 03/06/2013 [facility]): Left main with mild luminal irregularities, 50-60% mid LAD stenosis spanning the origin of moderate caliber second diagonal branch which is larger in caliber than the distal LAD, left circumflex with mild luminal irregularities, RCA with mild luminal irregularities.  Echocardiogram 11/21/2016: Study Conclusions  - Left ventricle: The cavity size was normal. Systolic function was normal. The estimated ejection fraction was in the range of 50% to 55%. Doppler parameters are consistent with abnormal left ventricular relaxation (grade 1 diastolic dysfunction). - Mitral valve: Calcified annulus. Normal thickness leaflets . - Left atrium: The atrium was mildly to moderately dilated. - Tricuspid valve: There was mild regurgitation.  Assessment and Plan:  1.  Probable exertional angina based on discussion today.  Also decreased stamina and fatigue.  Plan is to obtain an exercise Myoview for reevaluation of ischemic heart disease.  Cardiac catheterization in 2014 revealed moderate mid LAD stenosis.  2.  Familial  hyperlipidemia with statin intolerance.  Last LDL was 288.  Requesting most recent lipid panel from Dr. Anastasio Champion.  I have talked with her about the lipid clinic referral and other options such as Repatha.  We will check in the possibility of whether she might receive any coverage for this.  She has been hesitant previously but seems to be more amenable following discussion today.  3.  Paroxysmal atrial fibrillation with no progressive sense of palpitations.  She continues on Coumadin with CHADSVASC score of 3.  Obtain CBC from Dr. Anastasio Champion make sure she is not anemic as cause of any of her exertional symptoms.  Current medicines were reviewed with the patient today.   Orders Placed This Encounter  Procedures  . NM Myocar Multi W/Spect W/Wall Motion / EF    Disposition: Call with test results and next step.  Signed, Satira Sark, MD, [facility] 09/05/2018 1:37 PM    [facility] at [facility] [address], [place], [place] [address]  Phone: [phone]; Fax: [phone]

## 2018-09-05 ENCOUNTER — Encounter: Payer: Self-pay | Admitting: Cardiology

## 2018-09-05 ENCOUNTER — Ambulatory Visit (INDEPENDENT_AMBULATORY_CARE_PROVIDER_SITE_OTHER): Payer: Medicare Other | Admitting: Cardiology

## 2018-09-05 VITALS — BP 104/70 | HR 63 | Ht 64.0 in | Wt 159.0 lb

## 2018-09-05 DIAGNOSIS — Z789 Other specified health status: Secondary | ICD-10-CM | POA: Diagnosis not present

## 2018-09-05 DIAGNOSIS — I25119 Atherosclerotic heart disease of native coronary artery with unspecified angina pectoris: Secondary | ICD-10-CM

## 2018-09-05 DIAGNOSIS — I25810 Atherosclerosis of coronary artery bypass graft(s) without angina pectoris: Secondary | ICD-10-CM

## 2018-09-05 DIAGNOSIS — I480 Paroxysmal atrial fibrillation: Secondary | ICD-10-CM

## 2018-09-05 DIAGNOSIS — E7849 Other hyperlipidemia: Secondary | ICD-10-CM

## 2018-09-05 NOTE — Patient Instructions (Signed)
Medication Instructions:  Your physician recommends that you continue on your current medications as directed. Please refer to the Current Medication list given to you today.   If you need a refill on your cardiac medications before your next appointment, please call your pharmacy.    Lab work: NONE If you have labs (blood work) drawn today and your tests are completely normal, you will receive your results only by: Marland Kitchen MyChart Message (if you have MyChart) OR . A paper copy in the mail If you have any lab test that is abnormal or we need to change your treatment, we will call you to review the results.  Testing/Procedures: Your physician has requested that you have en exercise stress myoview. For further information please visit [email or web address]. Please follow instruction sheet, as given.    Follow-Up:We will call you with results    Any Other Special Instructions Will Be Listed Below (If Applicable). Please fill out repatha form and return to office

## 2018-09-11 ENCOUNTER — Encounter (HOSPITAL_COMMUNITY)
Admission: RE | Admit: 2018-09-11 | Discharge: 2018-09-11 | Disposition: A | Discharge status: Home / Self Care | Payer: Medicare Other | Source: Ambulatory Visit | Attending: Cardiology | Admitting: Cardiology

## 2018-09-11 ENCOUNTER — Encounter (HOSPITAL_COMMUNITY): Payer: Self-pay

## 2018-09-11 ENCOUNTER — Telehealth: Payer: Self-pay

## 2018-09-11 ENCOUNTER — Encounter (HOSPITAL_BASED_OUTPATIENT_CLINIC_OR_DEPARTMENT_OTHER)
Admission: RE | Admit: 2018-09-11 | Discharge: 2018-09-11 | Disposition: A | Discharge status: Home / Self Care | Payer: Medicare Other | Source: Ambulatory Visit | Attending: Cardiology | Admitting: Cardiology

## 2018-09-11 DIAGNOSIS — I25119 Atherosclerotic heart disease of native coronary artery with unspecified angina pectoris: Secondary | ICD-10-CM

## 2018-09-11 DIAGNOSIS — I480 Paroxysmal atrial fibrillation: Secondary | ICD-10-CM

## 2018-09-11 LAB — NM MYOCAR MULTI W/SPECT W/WALL MOTION / EF
CHL CUP MPHR: 148 {beats}/min
CHL CUP NUCLEAR SRS: 2
CHL CUP RESTING HR STRESS: 56 {beats}/min
CSEPEDS: 31 s
CSEPEW: 7 METS
CSEPHR: 90 %
Exercise duration (min): 4 min
LHR: 0.39
LV dias vol: 89 mL (ref 46–106)
LV sys vol: 38 mL
Peak HR: 134 {beats}/min
RPE: 13
SDS: 4
SSS: 6
TID: 1.11

## 2018-09-11 IMAGING — NM NM MYOCAR MULTI W/SPECT W/WALL MOTION & EF
2 series · 12 of 12 positions shown · non-contrast
Comparison: none

[Series 1: rest · 6.51mm/px · 6 of 64 frames shown]
[frame 6/64]
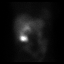
[frame 16/64]
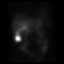
[frame 27/64]
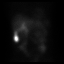
[frame 38/64]
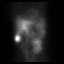
[frame 48/64]
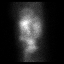
[frame 59/64]
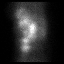

[Series 3: stress gated - perfusion · 6.51mm/px · 6 of 64 frames shown]
[frame 6/64]
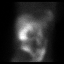
[frame 16/64]
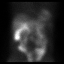
[frame 27/64]
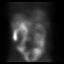
[frame 38/64]
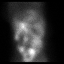
[frame 48/64]
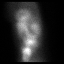
[frame 59/64]
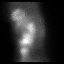

[12 of 12 positions shown; findings below may reference images not displayed]

Canned report from images found in remote index.

Refer to host system for actual result text.

## 2018-09-11 MED ORDER — REGADENOSON 0.4 MG/5ML IV SOLN
INTRAVENOUS | Status: AC
  Filled 2018-09-11: qty 5

## 2018-09-11 MED ORDER — SODIUM CHLORIDE 0.9% FLUSH
INTRAVENOUS | Status: AC
  Administered 2018-09-11: 10 mL via INTRAVENOUS
  Filled 2018-09-11: qty 10

## 2018-09-11 MED ORDER — TECHNETIUM TC 99M TETROFOSMIN IV KIT
30.0000 | PACK | Freq: Once | INTRAVENOUS | Status: AC | PRN
  Administered 2018-09-11: 29 via INTRAVENOUS

## 2018-09-11 MED ORDER — TECHNETIUM TC 99M TETROFOSMIN IV KIT
10.0000 | PACK | Freq: Once | INTRAVENOUS | Status: AC | PRN
  Administered 2018-09-11: 11 via INTRAVENOUS

## 2018-09-11 NOTE — Telephone Encounter (Signed)
-----   Message from Satira Sark, MD sent at 09/11/2018  3:59 PM EDT ----- Results reviewed.  I reviewed the test and imaging.  ECG changes are overall nonspecific.  There is a perfusion abnormality predominantly in the basal inferoseptal distribution which would not be consistent with her previously defined LAD disease.  The question is whether there has been other change in her coronary arteries.  There is gut uptake of radiotracer near this region at rest so some of this could be artifactual.  In light of her exertional symptoms however, we could further clarify things with a follow-up diagnostic cardiac catheterization if she is amenable. A copy of this test should be forwarded to Doree Albee, MD.

## 2018-09-11 NOTE — Telephone Encounter (Signed)
Patient's inclination is to decline and wait if she has any more symptoms

## 2018-09-11 NOTE — Telephone Encounter (Signed)
Attempt to reach, [id number] 

## 2018-09-17 NOTE — H&P (View-Only) (Signed)
Cardiology Office Note  Date: 09/18/2018   ID: Heather [name], DOB September 07, 1946, MRN [id number]  PCP: Heather Albee, MD  Primary Cardiologist: Heather Lesches, MD   Chief Complaint  Patient presents with  . Coronary Artery Disease    History of Present Illness: Heather [name] is a 72 y.o. female seen recently in the office reporting generalized decreased in stamina with fatigue and also probable exertional angina symptoms, worse in the last few months.  She was referred for follow-up ischemic testing with prior history of moderate mid LAD disease as of 2014.  Exercise Myoview from October 31 was overall low risk, demonstrated hypertensive response, equivocal ST segment changes, and with perfusion imaging suggesting possible basal septal scar with mild peri-infarct ischemia.  I reviewed the results with the patient and her husband today.  We discussed the possibility of a follow-up diagnostic cardiac catheterization, particularly in light of her symptomatology.  We discussed the risks and benefits and she is in agreement to proceed.  She is on Coumadin with follow-up in the anticoagulation clinic.  This will be held prior to her procedure without bridging.  Past Medical History:  Diagnosis Date  . Adrenal adenoma   . Allergy   . Anemia   . Anxiety   . CAD (coronary artery disease)    50-60% mid LAD at cardiac catheterization 2014 [facility])  . Cataract   . Depression   . DJD (degenerative joint disease) of cervical spine 09/12/2016  . History of cardiomyopathy   . History of tobacco use   . Hyperlipidemia   . Neuromuscular disorder ([place])   . PAF (paroxysmal atrial fibrillation) [facility])    Diagnosis July 2017 - spontaneously resolved  . Statin intolerance     Past Surgical History:  Procedure Laterality Date  . ABDOMINAL HYSTERECTOMY  1980   endometriosis  . TONSILLECTOMY  1954    Current Outpatient Medications  Medication Sig Dispense Refill  . Biotin 10000 MCG  TABS Take by mouth.    Marland Kitchen buPROPion (WELLBUTRIN SR) 150 MG 12 hr tablet Take 1 tablet (150 mg total) by mouth 2 (two) times daily. 180 tablet 1  . co-enzyme Q-10 30 MG capsule Take 100 mg by mouth daily.     Marland Kitchen docusate sodium (COLACE) 100 MG capsule Take 100 mg by mouth daily.    Marland Kitchen estradiol (ESTRACE) 0.5 MG tablet Take 1.5 mg by mouth daily.    . NP THYROID 60 MG tablet Take 60 mg by mouth daily.  3  . oxyCODONE-acetaminophen (ROXICET) 5-325 MG tablet Take 1 tablet by mouth daily as needed for severe pain. 30 tablet 0  . progesterone (PROMETRIUM) 200 MG capsule Take 200 mg by mouth daily.    . Testosterone 20 % CREA by Does not apply route.    . warfarin (COUMADIN) 5 MG tablet Take 2 tablets daily except 1 tablet on Mondays and Fridays or as directed 180 tablet 3   No current facility-administered medications for this visit.    Allergies:  Statins and Sulfa antibiotics   Social History: The patient  reports that she quit smoking about 10 years ago. Her smoking use included cigarettes. She started smoking about 52 years ago. She smoked 0.00 packs per day. She has never used smokeless tobacco. She reports that she drinks about 1.0 standard drinks of alcohol per week. She reports that she has current or past drug history. Drug: Marijuana.   Family History: The patient's family history includes Arthritis in her father  and sister; COPD in her mother; Cancer - Prostate in her brother; Hearing loss in her mother; Heart disease in her father, mother, and sister; Hyperlipidemia in her brother; Hypertension in her brother and father; Stroke in her mother.   ROS:  Please see the history of present illness. Otherwise, complete review of systems is positive for none.  All other systems are reviewed and negative.   Physical Exam: VS:  BP 114/62 (BP Location: Left Arm)   Pulse (!) 54   Ht 5\' 4"  (1.626 m)   Wt 159 lb (72.1 kg)   LMP 07/08/1979 (Approximate)   SpO2 96%   BMI 27.29 kg/m , BMI Body mass  index is 27.29 kg/m.  Wt Readings from Last 3 Encounters:  09/18/18 159 lb (72.1 kg)  09/05/18 159 lb (72.1 kg)  02/25/18 154 lb 8 oz (70.1 kg)    General: Patient appears comfortable at rest. HEENT: Conjunctiva and lids normal, oropharynx clear. Neck: Supple, no elevated JVP or carotid bruits, no thyromegaly. Lungs: Clear to auscultation, nonlabored breathing at rest. Cardiac: Regular rate and rhythm, no S3 or significant systolic murmur, no pericardial rub. Abdomen: Soft, nontender, no hepatomegaly, bowel sounds present, no guarding or rebound. Extremities: No pitting edema, distal pulses 2+. Skin: Warm and dry. Musculoskeletal: No kyphosis. Neuropsychiatric: Alert and oriented x3, affect grossly appropriate.  ECG: I personally reviewed the tracing from 01/06/2018 which showed sinus bradycardia with nonspecific ST-T changes.  Recent Labwork:  October 2017: Cholesterol 385, triglycerides 78, HDL 81, LDL 288, ALT 14, AST 15, BUN 17, creatinine 0.81  Other Studies Reviewed Today:  Cardiac catheterization 03/06/2013 [facility]): Left main with mild luminal irregularities, 50-60% mid LAD stenosis spanning the origin of moderate caliber second diagonal branch which is larger in caliber than the distal LAD, left circumflex with mild luminal irregularities, RCA with mild luminal irregularities.  Exercise Myoview 09/11/2018:  Blood pressure demonstrated a hypertensive response to exercise.  Upsloping ST segment depression ST segment depression was noted during stress in the II, III, aVF, V5 and V6 leads. Not specific for ischemia.  The left ventricular ejection fraction is normal (55-65%).  Findings consistent with prior basal septal myocardial infarction with mild peri-infarct ischemia.  This is a low risk study.  Assessment and Plan:  1.  Exertional dyspnea and suspected angina with decreased in stamina, most notable in the last few months.  She has a  history of 50 to 60% mid LAD stenosis at cardiac catheterization in 2014.  Recent Myoview was abnormal, although not showing anterior ischemia specifically, there was question of basal septal mild peri-infarct ischemia.  We have discussed proceeding to a diagnostic cardiac catheterization to most clearly define coronary anatomy and assess for potential revascularization options.  We reviewed the risks and benefits and she is in agreement to proceed.  2.  Paroxysmal atrial fibrillation, no progressive palpitations.  She continues on Coumadin with a CHADSVASC score of 3.  Coumadin will be held prior to cardiac catheterization without bridging.  3.  Familial hyperlipidemia with significant statin intolerance.  She is in agreement to considering Repatha, has completed assistance documentation for further assessment.  Last LDL was 288.  Current medicines were reviewed with the patient today.   Orders Placed This Encounter  Procedures  . CBC  . Basic Metabolic Panel (BMET)  . INR/PT    Disposition: Follow-up after procedure.  Signed, Satira Sark, MD, [facility] 09/18/2018 2:19 PM    [place] at  [place] [address] [place], [place] [address] Phone: [phone]; Fax: [phone]

## 2018-09-17 NOTE — Progress Notes (Signed)
Cardiology Office Note  Date: 09/18/2018   ID: Aribella Vavra, DOB [birth year], MRN [id number]  PCP: Doree Albee, MD  Primary Cardiologist: Rozann Lesches, MD   Chief Complaint  Patient presents with  . Coronary Artery Disease    History of Present Illness: Heather [name] is a 72 y.o. female seen recently in the office reporting generalized decreased in stamina with fatigue and also probable exertional angina symptoms, worse in the last few months.  She was referred for follow-up ischemic testing with prior history of moderate mid LAD disease as of 2014.  Exercise Myoview from October 31 was overall low risk, demonstrated hypertensive response, equivocal ST segment changes, and with perfusion imaging suggesting possible basal septal scar with mild peri-infarct ischemia.  I reviewed the results with the patient and her husband today.  We discussed the possibility of a follow-up diagnostic cardiac catheterization, particularly in light of her symptomatology.  We discussed the risks and benefits and she is in agreement to proceed.  She is on Coumadin with follow-up in the anticoagulation clinic.  This will be held prior to her procedure without bridging.  Past Medical History:  Diagnosis Date  . Adrenal adenoma   . Allergy   . Anemia   . Anxiety   . CAD (coronary artery disease)    50-60% mid LAD at cardiac catheterization 2014 [facility])  . Cataract   . Depression   . DJD (degenerative joint disease) of cervical spine 09/12/2016  . History of cardiomyopathy   . History of tobacco use   . Hyperlipidemia   . Neuromuscular disorder ([place])   . PAF (paroxysmal atrial fibrillation) [facility])    Diagnosis July 2017 - spontaneously resolved  . Statin intolerance     Past Surgical History:  Procedure Laterality Date  . ABDOMINAL HYSTERECTOMY  1980   endometriosis  . TONSILLECTOMY  1954    Current Outpatient Medications  Medication Sig Dispense Refill  . Biotin 10000 MCG  TABS Take by mouth.    Marland Kitchen buPROPion (WELLBUTRIN SR) 150 MG 12 hr tablet Take 1 tablet (150 mg total) by mouth 2 (two) times daily. 180 tablet 1  . co-enzyme Q-10 30 MG capsule Take 100 mg by mouth daily.     Marland Kitchen docusate sodium (COLACE) 100 MG capsule Take 100 mg by mouth daily.    Marland Kitchen estradiol (ESTRACE) 0.5 MG tablet Take 1.5 mg by mouth daily.    . NP THYROID 60 MG tablet Take 60 mg by mouth daily.  3  . oxyCODONE-acetaminophen (ROXICET) 5-325 MG tablet Take 1 tablet by mouth daily as needed for severe pain. 30 tablet 0  . progesterone (PROMETRIUM) 200 MG capsule Take 200 mg by mouth daily.    . Testosterone 20 % CREA by Does not apply route.    . warfarin (COUMADIN) 5 MG tablet Take 2 tablets daily except 1 tablet on Mondays and Fridays or as directed 180 tablet 3   No current facility-administered medications for this visit.    Allergies:  Statins and Sulfa antibiotics   Social History: The patient  reports that she quit smoking about 10 years ago. Her smoking use included cigarettes. She started smoking about 52 years ago. She smoked 0.00 packs per day. She has never used smokeless tobacco. She reports that she drinks about 1.0 standard drinks of alcohol per week. She reports that she has current or past drug history. Drug: Marijuana.   Family History: The patient's family history includes Arthritis in her father  and sister; COPD in her mother; Cancer - Prostate in her brother; Hearing loss in her mother; Heart disease in her father, mother, and sister; Hyperlipidemia in her brother; Hypertension in her brother and father; Stroke in her mother.   ROS:  Please see the history of present illness. Otherwise, complete review of systems is positive for none.  All other systems are reviewed and negative.   Physical Exam: VS:  BP 114/62 (BP Location: Left Arm)   Pulse (!) 54   Ht 5\' 4"  (1.626 m)   Wt 159 lb (72.1 kg)   LMP 07/08/1979 (Approximate)   SpO2 96%   BMI 27.29 kg/m , BMI Body mass  index is 27.29 kg/m.  Wt Readings from Last 3 Encounters:  09/18/18 159 lb (72.1 kg)  09/05/18 159 lb (72.1 kg)  02/25/18 154 lb 8 oz (70.1 kg)    General: Patient appears comfortable at rest. HEENT: Conjunctiva and lids normal, oropharynx clear. Neck: Supple, no elevated JVP or carotid bruits, no thyromegaly. Lungs: Clear to auscultation, nonlabored breathing at rest. Cardiac: Regular rate and rhythm, no S3 or significant systolic murmur, no pericardial rub. Abdomen: Soft, nontender, no hepatomegaly, bowel sounds present, no guarding or rebound. Extremities: No pitting edema, distal pulses 2+. Skin: Warm and dry. Musculoskeletal: No kyphosis. Neuropsychiatric: Alert and oriented x3, affect grossly appropriate.  ECG: I personally reviewed the tracing from 01/06/2018 which showed sinus bradycardia with nonspecific ST-T changes.  Recent Labwork:  October 2017: Cholesterol 385, triglycerides 78, HDL 81, LDL 288, ALT 14, AST 15, BUN 17, creatinine 0.81  Other Studies Reviewed Today:  Cardiac catheterization 03/06/2013 [facility]): Left main with mild luminal irregularities, 50-60% mid LAD stenosis spanning the origin of moderate caliber second diagonal branch which is larger in caliber than the distal LAD, left circumflex with mild luminal irregularities, RCA with mild luminal irregularities.  Exercise Myoview 09/11/2018:  Blood pressure demonstrated a hypertensive response to exercise.  Upsloping ST segment depression ST segment depression was noted during stress in the II, III, aVF, V5 and V6 leads. Not specific for ischemia.  The left ventricular ejection fraction is normal (55-65%).  Findings consistent with prior basal septal myocardial infarction with mild peri-infarct ischemia.  This is a low risk study.  Assessment and Plan:  1.  Exertional dyspnea and suspected angina with decreased in stamina, most notable in the last few months.  She has a  history of 50 to 60% mid LAD stenosis at cardiac catheterization in 2014.  Recent Myoview was abnormal, although not showing anterior ischemia specifically, there was question of basal septal mild peri-infarct ischemia.  We have discussed proceeding to a diagnostic cardiac catheterization to most clearly define coronary anatomy and assess for potential revascularization options.  We reviewed the risks and benefits and she is in agreement to proceed.  2.  Paroxysmal atrial fibrillation, no progressive palpitations.  She continues on Coumadin with a CHADSVASC score of 3.  Coumadin will be held prior to cardiac catheterization without bridging.  3.  Familial hyperlipidemia with significant statin intolerance.  She is in agreement to considering Repatha, has completed assistance documentation for further assessment.  Last LDL was 288.  Current medicines were reviewed with the patient today.   Orders Placed This Encounter  Procedures  . CBC  . Basic Metabolic Panel (BMET)  . INR/PT    Disposition: Follow-up after procedure.  Signed, Satira Sark, MD, [facility] 09/18/2018 2:19 PM    [place] at  [place] [address] [place], [place] [address] Phone: [phone]; Fax: [phone]

## 2018-09-18 ENCOUNTER — Ambulatory Visit (INDEPENDENT_AMBULATORY_CARE_PROVIDER_SITE_OTHER): Payer: Medicare Other | Admitting: Cardiology

## 2018-09-18 ENCOUNTER — Other Ambulatory Visit (HOSPITAL_COMMUNITY)
Admission: RE | Admit: 2018-09-18 | Discharge: 2018-09-18 | Disposition: A | Discharge status: Home / Self Care | Payer: Medicare Other | Source: Ambulatory Visit | Attending: Cardiology | Admitting: Cardiology

## 2018-09-18 ENCOUNTER — Telehealth: Payer: Self-pay

## 2018-09-18 ENCOUNTER — Encounter: Payer: Self-pay | Admitting: Cardiology

## 2018-09-18 ENCOUNTER — Other Ambulatory Visit: Payer: Self-pay | Admitting: Cardiology

## 2018-09-18 VITALS — BP 114/62 | HR 54 | Ht 64.0 in | Wt 159.0 lb

## 2018-09-18 DIAGNOSIS — Z789 Other specified health status: Secondary | ICD-10-CM | POA: Diagnosis not present

## 2018-09-18 DIAGNOSIS — R0609 Other forms of dyspnea: Secondary | ICD-10-CM | POA: Diagnosis not present

## 2018-09-18 DIAGNOSIS — I480 Paroxysmal atrial fibrillation: Secondary | ICD-10-CM

## 2018-09-18 DIAGNOSIS — R9439 Abnormal result of other cardiovascular function study: Secondary | ICD-10-CM

## 2018-09-18 DIAGNOSIS — Z01818 Encounter for other preprocedural examination: Secondary | ICD-10-CM | POA: Diagnosis not present

## 2018-09-18 DIAGNOSIS — E7849 Other hyperlipidemia: Secondary | ICD-10-CM | POA: Diagnosis not present

## 2018-09-18 DIAGNOSIS — I25119 Atherosclerotic heart disease of native coronary artery with unspecified angina pectoris: Secondary | ICD-10-CM | POA: Insufficient documentation

## 2018-09-18 DIAGNOSIS — I25810 Atherosclerosis of coronary artery bypass graft(s) without angina pectoris: Secondary | ICD-10-CM

## 2018-09-18 LAB — CBC
HEMATOCRIT: 39.2 % (ref 36.0–46.0)
HEMOGLOBIN: 12.2 g/dL (ref 12.0–15.0)
MCH: 28.2 pg (ref 26.0–34.0)
MCHC: 31.1 g/dL (ref 30.0–36.0)
MCV: 90.5 fL (ref 80.0–100.0)
Platelets: 223 10*3/uL (ref 150–400)
RBC: 4.33 MIL/uL (ref 3.87–5.11)
RDW: 13.4 % (ref 11.5–15.5)
WBC: 6.6 10*3/uL (ref 4.0–10.5)
nRBC: 0 % (ref 0.0–0.2)

## 2018-09-18 LAB — BASIC METABOLIC PANEL
Anion gap: 8 (ref 5–15)
BUN: 16 mg/dL (ref 8–23)
CALCIUM: 9.6 mg/dL (ref 8.9–10.3)
CHLORIDE: 98 mmol/L (ref 98–111)
CO2: 25 mmol/L (ref 22–32)
CREATININE: 0.73 mg/dL (ref 0.44–1.00)
GFR calc Af Amer: >60 mL/min (ref >60)
GFR calc non Af Amer: >60 mL/min (ref >60)
Glucose, Bld: 100 mg/dL — ABNORMAL HIGH (ref 70–99)
Potassium: 4.5 mmol/L (ref 3.5–5.1)
Sodium: 131 mmol/L — ABNORMAL LOW (ref 135–145)

## 2018-09-18 LAB — PROTIME-INR
INR: 1.73
Prothrombin Time: 20 seconds — ABNORMAL HIGH (ref 11.4–15.2)

## 2018-09-18 MED ORDER — EVOLOCUMAB 140 MG/ML ~~LOC~~ SOSY: 140.0000 mg | PREFILLED_SYRINGE | SUBCUTANEOUS | 11 refills | Status: DC

## 2018-09-18 NOTE — Telephone Encounter (Signed)
E-scribed to [facility], Repatha 140 mg [place] injection  Pt has filled out Visual merchandiser, I will send once I have PA from [employer]

## 2018-09-18 NOTE — Patient Instructions (Addendum)
    [place] [address] Dept: [phone] Loc: [phone]  Julizza Sassone  09/18/2018  You are scheduled for a Cardiac Catheterization on Thursday, November 14 with Dr. Daneen Schick.  1. Please arrive at the [facility] (Main Entrance A) at [facility]: [address] [place], [place] [address] at 10:00 AM (This time is two hours before your procedure to ensure your preparation). Free valet parking service is available.   Special note: Every effort is made to have your procedure done on time. Please understand that emergencies sometimes delay scheduled procedures.  2. Diet: Do not eat solid foods after midnight.  The patient may have clear liquids until 5am upon the day of the procedure.  3. Labs: Get lab work today  CBC,BMET, PT/INR 4. Medication instructions in preparation for your procedure:   Contrast Allergy: No    Stop taking Coumadin 5 days before cath Stop on Saturday 09/20/18   On the morning of your procedure, take your Aspirin 81 mg and any morning medicines NOT listed above.  You may use sips of water.  5. Plan for one night stay--bring personal belongings. 6. Bring a current list of your medications and current insurance cards. 7. You MUST have a responsible person to drive you home. 8. Someone MUST be with you the first 24 hours after you arrive home or your discharge will be delayed. 9. Please wear clothes that are easy to get on and off and wear slip-on shoes.  Thank you for allowing [place] to care for you!   -- [facility]

## 2018-09-23 ENCOUNTER — Telehealth: Payer: Self-pay | Admitting: *Deleted

## 2018-09-23 NOTE — Telephone Encounter (Addendum)
Pt contacted pre-catheterization scheduled at [facility] for: Thursday September 25, 2018 12 noon Verified arrival time and place: [place] Entrance A at: 10 AM  No solid food after midnight prior to cath, clear liquids until 5 AM day of procedure. Contrast allergy: no  Hold: Coumadin-09/20/18 until post procedure.   Except hold medications AM meds can be  taken pre-cath with sip of water including: ASA 81 mg  Confirmed patient has responsible person to drive home post procedure and for 24 hours after you arrive home: yes

## 2018-09-25 ENCOUNTER — Other Ambulatory Visit: Payer: Self-pay

## 2018-09-25 ENCOUNTER — Ambulatory Visit (HOSPITAL_COMMUNITY)
Admission: RE | Admit: 2018-09-25 | Discharge: 2018-09-25 | Disposition: A | Discharge status: Home / Self Care | Payer: Medicare Other | Source: Ambulatory Visit | Attending: Interventional Cardiology | Admitting: Interventional Cardiology

## 2018-09-25 ENCOUNTER — Encounter (HOSPITAL_COMMUNITY)
Admission: RE | Disposition: A | Discharge status: Home / Self Care | Payer: Self-pay | Source: Ambulatory Visit | Attending: Interventional Cardiology

## 2018-09-25 DIAGNOSIS — E7849 Other hyperlipidemia: Secondary | ICD-10-CM | POA: Diagnosis not present

## 2018-09-25 DIAGNOSIS — Z7901 Long term (current) use of anticoagulants: Secondary | ICD-10-CM | POA: Diagnosis not present

## 2018-09-25 DIAGNOSIS — F419 Anxiety disorder, unspecified: Secondary | ICD-10-CM | POA: Diagnosis not present

## 2018-09-25 DIAGNOSIS — Z8249 Family history of ischemic heart disease and other diseases of the circulatory system: Secondary | ICD-10-CM | POA: Diagnosis not present

## 2018-09-25 DIAGNOSIS — I480 Paroxysmal atrial fibrillation: Secondary | ICD-10-CM | POA: Insufficient documentation

## 2018-09-25 DIAGNOSIS — Z87891 Personal history of nicotine dependence: Secondary | ICD-10-CM | POA: Insufficient documentation

## 2018-09-25 DIAGNOSIS — I10 Essential (primary) hypertension: Secondary | ICD-10-CM | POA: Diagnosis not present

## 2018-09-25 DIAGNOSIS — I5181 Takotsubo syndrome: Secondary | ICD-10-CM | POA: Diagnosis present

## 2018-09-25 DIAGNOSIS — Z882 Allergy status to sulfonamides status: Secondary | ICD-10-CM | POA: Insufficient documentation

## 2018-09-25 DIAGNOSIS — I2510 Atherosclerotic heart disease of native coronary artery without angina pectoris: Secondary | ICD-10-CM | POA: Diagnosis not present

## 2018-09-25 DIAGNOSIS — E785 Hyperlipidemia, unspecified: Secondary | ICD-10-CM | POA: Diagnosis present

## 2018-09-25 DIAGNOSIS — R9439 Abnormal result of other cardiovascular function study: Secondary | ICD-10-CM

## 2018-09-25 HISTORY — PX: LEFT HEART CATH AND CORONARY ANGIOGRAPHY: CATH118249

## 2018-09-25 LAB — PROTIME-INR
INR: 1
Prothrombin Time: 13.1 s (ref 11.4–15.2)

## 2018-09-25 SURGERY — LEFT HEART CATH AND CORONARY ANGIOGRAPHY
Anesthesia: LOCAL

## 2018-09-25 MED ORDER — LIDOCAINE HCL (PF) 1 % IJ SOLN
INTRAMUSCULAR | Status: AC
  Filled 2018-09-25: qty 30

## 2018-09-25 MED ORDER — FENTANYL CITRATE (PF) 100 MCG/2ML IJ SOLN
INTRAMUSCULAR | Status: DC | PRN
  Administered 2018-09-25: 25 ug via INTRAVENOUS

## 2018-09-25 MED ORDER — SODIUM CHLORIDE 0.9% FLUSH: 3.0000 mL | INTRAVENOUS | Status: DC | PRN

## 2018-09-25 MED ORDER — IOHEXOL 350 MG/ML SOLN
INTRAVENOUS | Status: DC | PRN
  Administered 2018-09-25: 80 mL via INTRA_ARTERIAL

## 2018-09-25 MED ORDER — VERAPAMIL HCL 2.5 MG/ML IV SOLN
INTRAVENOUS | Status: AC
  Filled 2018-09-25: qty 2

## 2018-09-25 MED ORDER — ONDANSETRON HCL 4 MG/2ML IJ SOLN: 4.0000 mg | Freq: Four times a day (QID) | INTRAMUSCULAR | Status: DC | PRN

## 2018-09-25 MED ORDER — SODIUM CHLORIDE 0.9 % WEIGHT BASED INFUSION
3.0000 mL/kg/h | INTRAVENOUS | Status: AC
  Administered 2018-09-25: 3 mL/kg/h via INTRAVENOUS

## 2018-09-25 MED ORDER — LIDOCAINE HCL (PF) 1 % IJ SOLN
INTRAMUSCULAR | Status: DC | PRN
  Administered 2018-09-25: 2 mL via INTRADERMAL

## 2018-09-25 MED ORDER — SODIUM CHLORIDE 0.9% FLUSH: 3.0000 mL | Freq: Two times a day (BID) | INTRAVENOUS | Status: DC

## 2018-09-25 MED ORDER — MIDAZOLAM HCL 2 MG/2ML IJ SOLN
INTRAMUSCULAR | Status: DC | PRN
  Administered 2018-09-25: 1 mg via INTRAVENOUS

## 2018-09-25 MED ORDER — ASPIRIN 81 MG PO CHEW: 81.0000 mg | CHEWABLE_TABLET | ORAL | Status: DC

## 2018-09-25 MED ORDER — SODIUM CHLORIDE 0.9 % IV SOLN: 250.0000 mL | INTRAVENOUS | Status: DC | PRN

## 2018-09-25 MED ORDER — VERAPAMIL HCL 2.5 MG/ML IV SOLN
INTRAVENOUS | Status: DC | PRN
  Administered 2018-09-25: 10 mL via INTRA_ARTERIAL

## 2018-09-25 MED ORDER — MIDAZOLAM HCL 2 MG/2ML IJ SOLN
INTRAMUSCULAR | Status: AC
  Filled 2018-09-25: qty 2

## 2018-09-25 MED ORDER — HEPARIN (PORCINE) IN NACL 1000-0.9 UT/500ML-% IV SOLN
INTRAVENOUS | Status: AC
  Filled 2018-09-25: qty 1000

## 2018-09-25 MED ORDER — SODIUM CHLORIDE 0.9 % WEIGHT BASED INFUSION: 1.0000 mL/kg/h | INTRAVENOUS | Status: DC

## 2018-09-25 MED ORDER — HEPARIN (PORCINE) IN NACL 1000-0.9 UT/500ML-% IV SOLN
INTRAVENOUS | Status: DC | PRN
  Administered 2018-09-25 (×2): 500 mL

## 2018-09-25 MED ORDER — ACETAMINOPHEN 325 MG PO TABS: 650.0000 mg | ORAL_TABLET | ORAL | Status: DC | PRN

## 2018-09-25 MED ORDER — SODIUM CHLORIDE 0.9 % IV SOLN: INTRAVENOUS | Status: DC

## 2018-09-25 MED ORDER — HEPARIN SODIUM (PORCINE) 1000 UNIT/ML IJ SOLN
INTRAMUSCULAR | Status: DC | PRN
  Administered 2018-09-25: 3500 [IU] via INTRAVENOUS

## 2018-09-25 MED ORDER — FENTANYL CITRATE (PF) 100 MCG/2ML IJ SOLN
INTRAMUSCULAR | Status: AC
  Filled 2018-09-25: qty 2

## 2018-09-25 SURGICAL SUPPLY — 10 items
CATH 5FR JL3.5 JR4 ANG PIG MP (CATHETERS) ×2 IMPLANT
DEVICE RAD COMP TR BAND LRG (VASCULAR PRODUCTS) ×2 IMPLANT
GLIDESHEATH SLEND A-KIT 6F 22G (SHEATH) ×2 IMPLANT
GUIDEWIRE INQWIRE 1.5J.035X260 (WIRE) ×2 IMPLANT
INQWIRE 1.5J .035X260CM (WIRE) ×4
KIT HEART LEFT (KITS) ×2 IMPLANT
PACK CARDIAC CATHETERIZATION (CUSTOM PROCEDURE TRAY) ×2 IMPLANT
SHEATH PROBE COVER 6X72 (BAG) ×2 IMPLANT
TRANSDUCER W/STOPCOCK (MISCELLANEOUS) ×2 IMPLANT
TUBING CIL FLEX 10 FLL-RA (TUBING) ×2 IMPLANT

## 2018-09-25 NOTE — Discharge Instructions (Signed)
Restart warfarin// tonight   Radial Site Care Refer to this sheet in the next few weeks. These instructions provide you with information about caring for yourself after your procedure. Your health care provider may also give you more specific instructions. Your treatment has been planned according to current medical practices, but problems sometimes occur. Call your health care provider if you have any problems or questions after your procedure. What can I expect after the procedure? After your procedure, it is typical to have the following:  Bruising at the radial site that usually fades within 1-2 weeks.  Blood collecting in the tissue (hematoma) that may be painful to the touch. It should usually decrease in size and tenderness within 1-2 weeks.  Follow these instructions at home:  Take medicines only as directed by your health care provider.  You may shower 24-48 hours after the procedure or as directed by your health care provider. Remove the bandage (dressing) and gently wash the site with plain soap and water. Pat the area dry with a clean towel. Do not rub the site, because this may cause bleeding.  Do not take baths, swim, or use a hot tub until your health care provider approves.  Check your insertion site every day for redness, swelling, or drainage.  Do not apply powder or lotion to the site.  Do not flex or bend the affected arm for 24 hours or as directed by your health care provider.  Do not push or pull heavy objects with the affected arm for 24 hours or as directed by your health care provider.  Do not lift over 10 lb (4.5 kg) for 5 days after your procedure or as directed by your health care provider.  Ask your health care provider when it is okay to: ? Return to work or school. ? Resume usual physical activities or sports. ? Resume sexual activity.  Do not drive home if you are discharged the same day as the procedure. Have someone else drive you.  You may drive  24 hours after the procedure unless otherwise instructed by your health care provider.  Do not operate machinery or power tools for 24 hours after the procedure.  If your procedure was done as an outpatient procedure, which means that you went home the same day as your procedure, a responsible adult should be with you for the first 24 hours after you arrive home.  Keep all follow-up visits as directed by your health care provider. This is important. Contact a health care provider if:  You have a fever.  You have chills.  You have increased bleeding from the radial site. Hold pressure on the site. CALL 911 Get help right away if:  You have unusual pain at the radial site.  You have redness, warmth, or swelling at the radial site.  You have drainage (other than a small amount of blood on the dressing) from the radial site.  The radial site is bleeding, and the bleeding does not stop after 30 minutes of holding steady pressure on the site.  Your arm or hand becomes pale, cool, tingly, or numb. This information is not intended to replace advice given to you by your health care provider. Make sure you discuss any questions you have with your health care provider. Document Released: 12/01/2010 Document Revised: 04/05/2016 Document Reviewed: 05/17/2014 Elsevier Interactive Patient Education  2018 [employer].

## 2018-09-25 NOTE — CV Procedure (Signed)
   Right radial approach using ultrasound guidance.  Segmental 30 to 40% mid LAD and 25% proximal/ostial LAD.  Irregularities in the proximal circumflex up to 30%.  40 to 50% mid RCA.  Normal LV function with normal LVEDP  Hemostasis with radial wrist band.  No complications.

## 2018-09-25 NOTE — Interval H&P Note (Signed)
History and Physical Interval Note:  09/25/2018 12:21 PM Cath Lab Visit (complete for each Cath Lab visit)  Clinical Evaluation Leading to the Procedure:   ACS: No.  Non-ACS:    Anginal Classification: CCS [name]  Anti-ischemic medical therapy: Minimal Therapy (1 class of medications)  Non-Invasive Test Results: Intermediate-risk stress test findings: cardiac mortality 1-3%/year  Prior CABG: No previous CABG        Heather [name]  has presented today for surgery, with the diagnosis of cad  The various methods of treatment have been discussed with the patient and family. After consideration of risks, benefits and other options for treatment, the patient has consented to  Procedure(s): LEFT HEART CATH AND CORONARY ANGIOGRAPHY (N/A) as a surgical intervention .  The patient's history has been reviewed, patient examined, no change in status, stable for surgery.  I have reviewed the patient's chart and labs.  Questions were answered to the patient's satisfaction.     Heather [name]

## 2018-09-26 ENCOUNTER — Encounter (HOSPITAL_COMMUNITY): Payer: Self-pay | Admitting: Interventional Cardiology

## 2018-09-30 DIAGNOSIS — R5383 Other fatigue: Secondary | ICD-10-CM | POA: Diagnosis not present

## 2018-09-30 DIAGNOSIS — E785 Hyperlipidemia, unspecified: Secondary | ICD-10-CM | POA: Diagnosis not present

## 2018-09-30 DIAGNOSIS — R6882 Decreased libido: Secondary | ICD-10-CM | POA: Diagnosis not present

## 2018-09-30 DIAGNOSIS — E559 Vitamin D deficiency, unspecified: Secondary | ICD-10-CM | POA: Diagnosis not present

## 2018-09-30 DIAGNOSIS — N951 Menopausal and female climacteric states: Secondary | ICD-10-CM | POA: Diagnosis not present

## 2018-10-15 NOTE — Progress Notes (Signed)
Cardiology Office Note  Date: 10/16/2018   ID: Heather [name], DOB 1946-09-07, MRN [id number]  PCP: Doree Albee, MD  Primary Cardiologist: Rozann Lesches, MD   Chief Complaint  Patient presents with  . Cardiac follow-up    History of Present Illness: Heather [name] is a 72 y.o. female last seen in November and referred for a diagnostic cardiac catheterization at that time in the setting of progressive fatigue and exertional dyspnea.  Procedure was performed by Dr. Tamala Julian on November 14 demonstrating mild to moderate nonobstructive CAD as detailed below with recommendation for medical therapy and risk factor modification.  Today we went over the results of her cardiac catheterization.  She was overall reassured, but still concerned about dyspnea on exertion.  She plans to see Dr. Anastasio Champion about further evaluation.  She does not report any chest tightness, no palpitations or syncope.  We went over her medications.  She tells me that she would like to stop Repatha at this time.  She is working on diet with Dr. Anastasio Champion, I would like to reassess her lipids in 6 months to make a decision about medications at that time.  He continues on Coumadin with CHADSVASC score of 3 and history of paroxysmal atrial fibrillation.   Past Medical History:  Diagnosis Date  . Adrenal adenoma   . Allergy   . Anemia   . Anxiety   . CAD (coronary artery disease)    50-60% mid LAD at cardiac catheterization 2014 [facility])  . Cataract   . Depression   . DJD (degenerative joint disease) of cervical spine 09/12/2016  . History of cardiomyopathy   . History of tobacco use   . Hyperlipidemia   . Neuromuscular disorder ([place])   . PAF (paroxysmal atrial fibrillation) [facility])    Diagnosis July 2017 - spontaneously resolved  . Statin intolerance     Past Surgical History:  Procedure Laterality Date  . ABDOMINAL HYSTERECTOMY  1980   endometriosis  . LEFT HEART CATH AND CORONARY ANGIOGRAPHY N/A  09/25/2018   Procedure: LEFT HEART CATH AND CORONARY ANGIOGRAPHY;  Surgeon: Belva Crome, MD;  Location: [facility];  Service: Cardiovascular;  Laterality: N/A;  . TONSILLECTOMY  1954    Current Outpatient Medications  Medication Sig Dispense Refill  . Biotin 10000 MCG TABS Take 10,000 mcg by mouth daily.     Marland Kitchen buPROPion (WELLBUTRIN SR) 150 MG 12 hr tablet Take 1 tablet (150 mg total) by mouth 2 (two) times daily. (Patient taking differently: Take 150 mg by mouth every other day. ) 180 tablet 1  . Cholecalciferol (VITAMIN D3) 125 MCG (5000 UT) TABS Take 5,000 Units by mouth daily.    . Coenzyme Q10 100 MG capsule Take 100 mg by mouth daily.     . Cyanocobalamin (VITAMIN B-12) 2500 MCG SUBL Place 2,500 mcg under the tongue daily.    Marland Kitchen docusate sodium (COLACE) 100 MG capsule Take 100 mg by mouth daily.    Marland Kitchen estradiol (ESTRACE) 1 MG tablet Take 1 mg by mouth daily.     . NP THYROID 60 MG tablet Take 90 mg by mouth daily.   3  . oxyCODONE-acetaminophen (ROXICET) 5-325 MG tablet Take 1 tablet by mouth daily as needed for severe pain. (Patient taking differently: Take 0.5 tablets by mouth daily as needed for severe pain. ) 30 tablet 0  . Probiotic Product (PROBIOTIC DAILY PO) Take 1 capsule by mouth daily.    . progesterone (PROMETRIUM) 200 MG  capsule Take 200 mg by mouth daily.    . Testosterone 20 % CREA Apply 1 application topically daily.     Marland Kitchen warfarin (COUMADIN) 5 MG tablet Take 2 tablets daily except 1 tablet on Mondays and Fridays or as directed (Patient taking differently: Take 5-10 mg by mouth See admin instructions. Take 10 mg by mouth daily except 5 mg on Mondays and Fridays) 180 tablet 3  . Evolocumab (REPATHA) 140 MG/ML SOSY Inject 140 mg into the skin every 14 (fourteen) days. (Patient not taking: Reported on 10/16/2018) 2 Syringe 11   No current facility-administered medications for this visit.    Allergies:  Statins and Sulfa antibiotics   Social History: The patient   reports that she quit smoking about 10 years ago. Her smoking use included cigarettes. She started smoking about 52 years ago. She smoked 0.00 packs per day. She has never used smokeless tobacco. She reports that she drinks about 1.0 standard drinks of alcohol per week. She reports that she has current or past drug history. Drug: Marijuana.   ROS:  Please see the history of present illness. Otherwise, complete review of systems is positive for none.  All other systems are reviewed and negative.   Physical Exam: VS:  BP 120/64   Pulse (!) 56   Ht 5\' 4"  (1.626 m)   Wt 159 lb 12.8 oz (72.5 kg)   LMP 07/08/1979 (Approximate)   SpO2 93%   BMI 27.43 kg/m , BMI Body mass index is 27.43 kg/m.  Wt Readings from Last 3 Encounters:  10/16/18 159 lb 12.8 oz (72.5 kg)  09/25/18 158 lb (71.7 kg)  09/18/18 159 lb (72.1 kg)    General: Patient appears comfortable at rest. HEENT: Conjunctiva and lids normal, oropharynx clear. Neck: Supple, no elevated JVP or carotid bruits, no thyromegaly. Lungs: Clear to auscultation, nonlabored breathing at rest. Cardiac: Regular rate and rhythm, no S3 or significant systolic murmur. Abdomen: Soft, nontender, bowel sounds present. Extremities: No pitting edema, distal pulses 2+. Skin: Warm and dry. Musculoskeletal: No kyphosis. Neuropsychiatric: Alert and oriented x3, affect grossly appropriate.  ECG: I personally reviewed the tracing from 09/25/2018 which showed sinus bradycardia with poor R wave progression.  Recent Labwork: 09/18/2018: BUN 16; Creatinine, Ser 0.73; Hemoglobin 12.2; Platelets 223; Potassium 4.5; Sodium 131   Other Studies Reviewed Today:  Cardiac catheterization 09/25/2018:  Right dominant coronary anatomy  Short left main, widely patent  Ostial 25% LAD followed by a segmental 30 to 40% region of narrowing.  One large diagonal branch arises from this region and is as large as the left anterior descending which has no significant focal  obstruction.  Circumflex has proximal irregularity up to 30%.  2 large obtuse marginal branches are free of obstruction.  The right coronary artery contains a relatively short segmental 40 to 50% narrowing.  Left ventricular function is normal with EF greater than 55%.  LVEDP is normal.  Assessment and Plan:  1.  Exertional dyspnea.  Fortunately, recent follow-up cardiac catheterization demonstrated only mild to moderate coronary atherosclerosis, no significant progression compared to prior evaluation.  LVEDP was normal.  Agree with continued medical therapy, diet and exercise.  She is not on aspirin with concurrent use of Coumadin.  I would still recommend Repatha in light of familial hyperlipidemia and known statin intolerance with LDL 288.  She prefers to try a more aggressive diet and will plan to follow-up lipids in about 6 months.  In the meanwhile she sees Dr. Anastasio Champion,  it may be worth considering a CPX evaluation and possibly PFTs.  2.  Paroxysmal atrial fibrillation, continues on Coumadin for stroke prophylaxis.  Current medicines were reviewed with the patient today.  Disposition: Follow-up in 6 months.  Signed, Satira Sark, MD, [facility] 10/16/2018 2:16 PM    [facility] at [facility] [address], [place], [place] [address] Phone: [phone]; Fax: [phone]

## 2018-10-16 ENCOUNTER — Ambulatory Visit (INDEPENDENT_AMBULATORY_CARE_PROVIDER_SITE_OTHER): Payer: Medicare Other | Admitting: Cardiology

## 2018-10-16 ENCOUNTER — Encounter: Payer: Self-pay | Admitting: Cardiology

## 2018-10-16 VITALS — BP 120/64 | HR 56 | Ht 64.0 in | Wt 159.8 lb

## 2018-10-16 DIAGNOSIS — I25810 Atherosclerosis of coronary artery bypass graft(s) without angina pectoris: Secondary | ICD-10-CM

## 2018-10-16 DIAGNOSIS — I25119 Atherosclerotic heart disease of native coronary artery with unspecified angina pectoris: Secondary | ICD-10-CM

## 2018-10-16 NOTE — Patient Instructions (Addendum)
Your physician wants you to follow-up in: 6 months with Dr.McDowell You will receive a reminder letter in the mail two months in advance. If you don't receive a letter, please call our office to schedule the follow-up appointment.     Your physician recommends that you continue on your current medications as directed. Please refer to the Current Medication list given to you today.   If you need a refill on your cardiac medications before your next appointment, please call your pharmacy.     No lab work or tests today.     Thank you for choosing [facility] !        

## 2018-10-29 DIAGNOSIS — K5900 Constipation, unspecified: Secondary | ICD-10-CM | POA: Diagnosis not present

## 2018-11-03 DIAGNOSIS — H35373 Puckering of macula, bilateral: Secondary | ICD-10-CM | POA: Diagnosis not present

## 2018-11-03 DIAGNOSIS — H40013 Open angle with borderline findings, low risk, bilateral: Secondary | ICD-10-CM | POA: Diagnosis not present

## 2018-11-03 DIAGNOSIS — H353131 Nonexudative age-related macular degeneration, bilateral, early dry stage: Secondary | ICD-10-CM | POA: Diagnosis not present

## 2018-11-03 DIAGNOSIS — H31091 Other chorioretinal scars, right eye: Secondary | ICD-10-CM | POA: Diagnosis not present

## 2018-11-03 DIAGNOSIS — H2513 Age-related nuclear cataract, bilateral: Secondary | ICD-10-CM | POA: Diagnosis not present

## 2018-11-12 HISTORY — PX: CATARACT EXTRACTION: SUR2

## 2018-11-13 ENCOUNTER — Other Ambulatory Visit (HOSPITAL_COMMUNITY): Payer: Self-pay | Admitting: Internal Medicine

## 2018-11-13 DIAGNOSIS — N951 Menopausal and female climacteric states: Secondary | ICD-10-CM | POA: Diagnosis not present

## 2018-11-13 DIAGNOSIS — E785 Hyperlipidemia, unspecified: Secondary | ICD-10-CM | POA: Diagnosis not present

## 2018-11-13 DIAGNOSIS — R6882 Decreased libido: Secondary | ICD-10-CM | POA: Diagnosis not present

## 2018-11-13 DIAGNOSIS — Z780 Asymptomatic menopausal state: Secondary | ICD-10-CM

## 2018-11-13 DIAGNOSIS — R5383 Other fatigue: Secondary | ICD-10-CM | POA: Diagnosis not present

## 2018-11-17 DIAGNOSIS — E785 Hyperlipidemia, unspecified: Secondary | ICD-10-CM | POA: Diagnosis not present

## 2018-11-17 DIAGNOSIS — E559 Vitamin D deficiency, unspecified: Secondary | ICD-10-CM | POA: Diagnosis not present

## 2018-11-17 DIAGNOSIS — N951 Menopausal and female climacteric states: Secondary | ICD-10-CM | POA: Diagnosis not present

## 2018-11-17 DIAGNOSIS — R5383 Other fatigue: Secondary | ICD-10-CM | POA: Diagnosis not present

## 2018-11-17 DIAGNOSIS — Z1159 Encounter for screening for other viral diseases: Secondary | ICD-10-CM | POA: Diagnosis not present

## 2018-11-17 DIAGNOSIS — Z23 Encounter for immunization: Secondary | ICD-10-CM | POA: Diagnosis not present

## 2018-11-17 DIAGNOSIS — R6882 Decreased libido: Secondary | ICD-10-CM | POA: Diagnosis not present

## 2018-11-18 DIAGNOSIS — H353131 Nonexudative age-related macular degeneration, bilateral, early dry stage: Secondary | ICD-10-CM | POA: Diagnosis not present

## 2018-11-20 ENCOUNTER — Ambulatory Visit (HOSPITAL_COMMUNITY)
Admission: RE | Admit: 2018-11-20 | Discharge: 2018-11-20 | Disposition: A | Discharge status: Home / Self Care | Payer: Medicare Other | Source: Ambulatory Visit | Attending: Internal Medicine | Admitting: Internal Medicine

## 2018-11-20 DIAGNOSIS — Z780 Asymptomatic menopausal state: Secondary | ICD-10-CM | POA: Insufficient documentation

## 2018-11-20 DIAGNOSIS — E2839 Other primary ovarian failure: Secondary | ICD-10-CM | POA: Diagnosis not present

## 2018-11-24 DIAGNOSIS — H35362 Drusen (degenerative) of macula, left eye: Secondary | ICD-10-CM | POA: Diagnosis not present

## 2018-11-24 DIAGNOSIS — H35371 Puckering of macula, right eye: Secondary | ICD-10-CM | POA: Diagnosis not present

## 2018-11-24 DIAGNOSIS — H353131 Nonexudative age-related macular degeneration, bilateral, early dry stage: Secondary | ICD-10-CM | POA: Diagnosis not present

## 2018-11-24 DIAGNOSIS — H2513 Age-related nuclear cataract, bilateral: Secondary | ICD-10-CM | POA: Diagnosis not present

## 2018-11-26 ENCOUNTER — Ambulatory Visit (INDEPENDENT_AMBULATORY_CARE_PROVIDER_SITE_OTHER): Payer: Medicare Other

## 2018-11-26 ENCOUNTER — Encounter: Payer: Self-pay | Admitting: Orthopaedic Surgery

## 2018-11-26 ENCOUNTER — Ambulatory Visit (INDEPENDENT_AMBULATORY_CARE_PROVIDER_SITE_OTHER): Payer: Medicare Other | Admitting: Orthopaedic Surgery

## 2018-11-26 VITALS — BP 119/68 | HR 60 | Ht 64.0 in | Wt 159.0 lb

## 2018-11-26 DIAGNOSIS — M25562 Pain in left knee: Secondary | ICD-10-CM

## 2018-11-26 NOTE — Progress Notes (Signed)
Subjective:    Patient ID: Heather [name], female    DOB: 07-11-1946, 73 y.o.   MRN: [id number]  HPI She has begun a walking program over the last few weeks.  She and her husband walk daily.  She has enjoyed it and has been walking a little more each time.  A week ago she noticed some left lateral knee pain which has been getting worse.  She has slight swelling.  She has no redness or numbness. She has tried ice and ibuprofen.  She had giving way once.  She has no other joint pains. She had no fall.  It hurt so bad a few days ago she used a cane but is much better today.   Review of Systems  Constitutional: Positive for activity change.  Musculoskeletal: Positive for arthralgias, gait problem and joint swelling.  Allergic/Immunologic: Positive for environmental allergies.  Psychiatric/Behavioral: The patient is nervous/anxious.   All other systems reviewed and are negative.  For Review of Systems, all other systems reviewed and are negative.  The following is a summary of the past history medically, past history surgically, known current medicines, social history and family history.  This information is gathered electronically by the computer from prior information and documentation.  I review this each visit and have found including this information at this point in the chart is beneficial and informative.   Past Medical History:  Diagnosis Date  . Adrenal adenoma   . Allergy   . Anemia   . Anxiety   . CAD (coronary artery disease)    50-60% mid LAD at cardiac catheterization 2014 [facility])  . Cataract   . Depression   . DJD (degenerative joint disease) of cervical spine 09/12/2016  . History of cardiomyopathy   . History of tobacco use   . Hyperlipidemia   . Neuromuscular disorder ([place])   . PAF (paroxysmal atrial fibrillation) [facility])    Diagnosis July 2017 - spontaneously resolved  . Statin intolerance     Past Surgical History:  Procedure Laterality Date  . ABDOMINAL  HYSTERECTOMY  1980   endometriosis  . LEFT HEART CATH AND CORONARY ANGIOGRAPHY N/A 09/25/2018   Procedure: LEFT HEART CATH AND CORONARY ANGIOGRAPHY;  Surgeon: Belva Crome, MD;  Location: [facility];  Service: Cardiovascular;  Laterality: N/A;  . TONSILLECTOMY  1954    Current Outpatient Medications on File Prior to Visit  Medication Sig Dispense Refill  . estradiol (ESTRACE) 2 MG tablet Take 2 mg by mouth daily.    . Biotin 10000 MCG TABS Take 10,000 mcg by mouth daily.     Marland Kitchen buPROPion (WELLBUTRIN SR) 150 MG 12 hr tablet Take 1 tablet (150 mg total) by mouth 2 (two) times daily. (Patient taking differently: Take 150 mg by mouth every other day. ) 180 tablet 1  . Cholecalciferol (VITAMIN D3) 125 MCG (5000 UT) TABS Take 5,000 Units by mouth daily.    . Coenzyme Q10 100 MG capsule Take 100 mg by mouth daily.     . Cyanocobalamin (VITAMIN B-12) 2500 MCG SUBL Place 2,500 mcg under the tongue daily.    Marland Kitchen docusate sodium (COLACE) 100 MG capsule Take 100 mg by mouth daily.    Marland Kitchen estradiol (ESTRACE) 1 MG tablet Take 1 mg by mouth daily.     . Evolocumab (REPATHA) 140 MG/ML SOSY Inject 140 mg into the skin every 14 (fourteen) days. (Patient not taking: Reported on 10/16/2018) 2 Syringe 11  . NP THYROID 60 MG  tablet Take 90 mg by mouth daily.   3  . oxyCODONE-acetaminophen (ROXICET) 5-325 MG tablet Take 1 tablet by mouth daily as needed for severe pain. (Patient taking differently: Take 0.5 tablets by mouth daily as needed for severe pain. ) 30 tablet 0  . Probiotic Product (PROBIOTIC DAILY PO) Take 1 capsule by mouth daily.    . progesterone (PROMETRIUM) 200 MG capsule Take 200 mg by mouth daily.    . Testosterone 20 % CREA Apply 1 application topically daily.     Marland Kitchen warfarin (COUMADIN) 5 MG tablet Take 2 tablets daily except 1 tablet on Mondays and Fridays or as directed (Patient taking differently: Take 5-10 mg by mouth See admin instructions. Take 10 mg by mouth daily except 5 mg on Mondays  and Fridays) 180 tablet 3   No current facility-administered medications on file prior to visit.     Social History   Socioeconomic History  . Marital status: Married    Spouse name: Al  . Number of children: 0  . Years of education: 72  . Highest education level: Not on file  Occupational History  . 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 Financial resource strain: Not on file  . Food insecurity:    Worry: Not on file    Inability: Not on file  . Transportation needs:    Medical: Not on file    Non-medical: Not on file  Tobacco Use  . Smoking status: Former Smoker    Packs/day: 0.00    Types: Cigarettes    Start date: 11/12/1965    Last attempt to quit: 09/06/2008    Years since quitting: 10.2  . Smokeless tobacco: Never Used  Substance and Sexual Activity  . Alcohol use: Yes    Alcohol/week: 1.0 standard drinks    Types: 1 Glasses of wine per week    Comment: occ  . Drug use: Yes    Types: Marijuana    Comment: rarely  . Sexual activity: Yes    Birth control/protection: Post-menopausal  Lifestyle  . Physical activity:    Days per week: Not on file    Minutes per session: Not on file  . Stress: Not on file  Relationships  . Social connections:    Talks on phone: Not on file    Gets together: Not on file    Attends religious service: Not on file    Active member of club or organization: Not on file    Attends meetings of clubs or organizations: Not on file    Relationship status: Not on file  . Intimate partner violence:    Fear of current or ex partner: Not on file    Emotionally abused: Not on file    Physically abused: Not on file    Forced sexual activity: Not on file  Other Topics Concern  . 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 COPD Mother   . Hearing loss Mother   . Stroke Mother   .  Heart disease Mother        chf, died at 47  . Arthritis Father   . Hypertension Father   . Heart disease Father        cad, pvd, died at 15  . Heart disease  Sister        heart valve  . Arthritis Sister   . Hyperlipidemia Brother   . Hypertension Brother   . Cancer - Prostate Brother     BP 119/68   Pulse 60   Ht 5\' 4"  (1.626 m)   Wt 159 lb (72.1 kg)   LMP 07/08/1979 (Approximate)   BMI 27.29 kg/m   Body mass index is 27.29 kg/m.      Objective:   Physical Exam Constitutional:      Appearance: She is well-developed.  HENT:     Head: Normocephalic and atraumatic.  Eyes:     Conjunctiva/sclera: Conjunctivae normal.     Pupils: Pupils are equal, round, and reactive to light.  Neck:     Musculoskeletal: Normal range of motion and neck supple.  Cardiovascular:     Rate and Rhythm: Normal rate and regular rhythm.  Pulmonary:     Effort: Pulmonary effort is normal.  Abdominal:     Palpations: Abdomen is soft.  Musculoskeletal:     Left knee: Tenderness found. Lateral joint line tenderness noted.       Legs:  Skin:    General: Skin is warm and dry.  Neurological:     Mental Status: She is alert and oriented to person, place, and time.     Cranial Nerves: No cranial nerve deficit.     Motor: No abnormal muscle tone.     Coordination: Coordination normal.     Deep Tendon Reflexes: Reflexes are normal and symmetric. Reflexes normal.  Psychiatric:        Behavior: Behavior normal.        Thought Content: Thought content normal.        Judgment: Judgment normal.       X-rays were done of the left knee, reported separately.    Assessment & Plan:   Encounter Diagnosis  Name Primary?  . Acute pain of left knee Yes   PROCEDURE NOTE:  The patient requests injections of the left knee , verbal consent was obtained.  The left knee was prepped appropriately after time out was performed.   Sterile technique was observed and injection of 1 cc of Depo-Medrol 40  mg with several cc's of plain xylocaine. Anesthesia was provided by ethyl chloride and a 20-gauge needle was used to inject the knee area. The injection was tolerated well.  A band aid dressing was applied.  The patient was advised to apply ice later today and tomorrow to the injection sight as needed.  She is on Coumadin and I cannot give NSAIDs.  Return in one week.  She may need MRI.  Call if any problem.  Precautions discussed.   Electronically Signed Sanjuana Kava, MD 1/15/20202:32 PM

## 2018-11-28 ENCOUNTER — Telehealth: Payer: Self-pay | Admitting: Neurology

## 2018-11-28 ENCOUNTER — Encounter: Payer: Self-pay | Admitting: Neurology

## 2018-11-28 ENCOUNTER — Ambulatory Visit (INDEPENDENT_AMBULATORY_CARE_PROVIDER_SITE_OTHER): Payer: Medicare Other | Admitting: Neurology

## 2018-11-28 VITALS — BP 156/81 | HR 57 | Ht 64.0 in

## 2018-11-28 DIAGNOSIS — M62838 Other muscle spasm: Secondary | ICD-10-CM | POA: Diagnosis not present

## 2018-11-28 DIAGNOSIS — G8929 Other chronic pain: Secondary | ICD-10-CM

## 2018-11-28 DIAGNOSIS — M546 Pain in thoracic spine: Secondary | ICD-10-CM | POA: Diagnosis not present

## 2018-11-28 DIAGNOSIS — M7918 Myalgia, other site: Secondary | ICD-10-CM

## 2018-11-28 NOTE — Telephone Encounter (Signed)
4  Mo Botox inj appt

## 2018-11-28 NOTE — Progress Notes (Signed)
[facility]  PATIENT: Heather [name] DOB: 1946-01-08  REFERRING DOCTOR OR PCP:  Blanchie Serve SOURCE:   _________________________________   HISTORICAL  CHIEF COMPLAINT:  Chief Complaint  Patient presents with  . Botulinum Toxin Injection    RM 12, with husband. Botox 100U x 1 vial. Lot: [id number], Expiration: 04/2021. [place]: [id number].     HISTORY OF PRESENT ILLNESS:  Heather [name] is a 73 y.o. woman who has had chronic right-sided mid back pain since 2013.      Update 11/28/2018: Her myofascial pain in the mid to lower right thoracic region has responded very well to the Botox therapy and is more than 75% better than it was initially.  Most of the pain is in the right intercostal region from T6-T9.  She has tolerated the Botox injections well.   When the pain is more severe, she will take a Percocet.  The quality of the pain is cramp-like.  Update 08/27/2018: She reports that the back pain is more than 50% better and the episodes of more significant pain have been significantly reduced.  She tolerated the injections without any problems.  Most of her pain is in the right intracoastal region from T6-T9.     When pain is more severe, she will take a Percocet.  She does not take more than 2 or 3 a week  She is on Warfarin for chronic Afib.     Update 05/22/2018: She is here today for her first Botox injections for myofascial pain in the right intracoastal region (T7-T9)  Update 02/25/2018: Her right sided thoracic pain flared up a week ago and was intense x 2-3 days.   Opioids make her feel sick. Diazepam 10 mg, Ibu and MJ helped her pain.    Currently pain is much better (improved over last 24 hours).   She also used the [employer] patch.     The first TPI we did helped her pain but the more recent one made pain worse.     She is wondering about Botox.    We discussed it in more detail.She  Is on coumadin for Afib.     From 05/01/2017: Her pain is about 1 inch  right of midline in the lower thoracic paraspinal region. 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 Pain worsens often with household chores or gardening. The pain may improve with using a TENS unit, a trigger point or Percocet. Heating pad also sometimes helps. When pain is more severe she has smoked marijuana with some benefit. Cannabinol oil did not help. Flexeril did not help. More recently she took a tizanidine and was very sleepy though pain was better that day. More recently, she took a tizanidine 4 mg but fell asleep 1-2 hours.  The trigger point injection at the last visit (thoracic paraspinal) did not help.   She denies any numbness or weakness in her arms or legs.   No bowel or bladder changes.    She is on coumadin for recently diagnose atrial fibrillation.      She was getting trigger point injections every 3-6 months for most of the past 4 years but needed more frequently last year when she was packing up to move to this area.        I reviewed her MRI results from 10/14/2013. The MRI of the brain showed age related atrophy and minimal chronic buccal vascular ischemic change. MRI of the cervical spine showed multilevel  mild degenerative changes with left paramedian disc herniation at C3-C4 and left disc protrusion at C4-C5 and C5-C6 and midline disc herniation at C6-C7. There was no report of nerve root compression. MRI of the thoracic spine showed disc desiccation but no herniation or protrusions. MRI of the lumbar spine showed disc bulges at T12-L1 and L2-L3 and disc bulge with facet hypertrophy at L3-L4 and disc bulge with right foraminal annular tear at L4-L5.   REVIEW OF SYSTEMS: Constitutional: No fevers, chills, sweats, or change in appetite Eyes: No visual changes, double vision, eye pain Ear, nose and throat: No hearing loss, ear pain, nasal congestion, sore throat Cardiovascular: No chest pain, palpitations.   She has atrial fibrillation  and is on warfarin Respiratory: No shortness of breath at rest or with exertion.   No wheezes GastrointestinaI: No nausea, vomiting, diarrhea, abdominal pain, fecal incontinence Genitourinary: No dysuria, urinary retention or frequency.  No nocturia. Musculoskeletal: No neck pain.  She has back pain/thoracic pain as above Integumentary: No rash, pruritus, skin lesions Neurological: as above Psychiatric: No depression at this time.  No anxiety Endocrine: No palpitations, diaphoresis, change in appetite, change in weigh or increased thirst Hematologic/Lymphatic: No anemia, purpura, petechiae. Allergic/Immunologic: No itchy/runny eyes, nasal congestion, recent allergic reactions, rashes  ALLERGIES: Allergies  Allergen Reactions  . Statins Other (See Comments)    Intolerant all statins  . Sulfa Antibiotics Rash    HOME MEDICATIONS:  Current Outpatient Medications:  .  Biotin 10000 MCG TABS, Take 10,000 mcg by mouth daily. , Disp: , Rfl:  .  buPROPion (WELLBUTRIN SR) 150 MG 12 hr tablet, Take 1 tablet (150 mg total) by mouth 2 (two) times daily. (Patient taking differently: Take 150 mg by mouth every other day. ), Disp: 180 tablet, Rfl: 1 .  Cholecalciferol (VITAMIN D3) 125 MCG (5000 UT) TABS, Take 5,000 Units by mouth daily., Disp: , Rfl:  .  Coenzyme Q10 100 MG capsule, Take 100 mg by mouth daily. , Disp: , Rfl:  .  Cyanocobalamin (VITAMIN B-12) 2500 MCG SUBL, Place 2,500 mcg under the tongue daily., Disp: , Rfl:  .  docusate sodium (COLACE) 100 MG capsule, Take 100 mg by mouth daily., Disp: , Rfl:  .  estradiol (ESTRACE) 1 MG tablet, Take 1 mg by mouth daily. , Disp: , Rfl:  .  estradiol (ESTRACE) 2 MG tablet, Take 2 mg by mouth daily., Disp: , Rfl:  .  Evolocumab (REPATHA) 140 MG/ML SOSY, Inject 140 mg into the skin every 14 (fourteen) days., Disp: 2 Syringe, Rfl: 11 .  NP THYROID 60 MG tablet, Take 120 mg by mouth daily. , Disp: , Rfl: 3 .  oxyCODONE-acetaminophen (ROXICET) 5-325  MG tablet, Take 1 tablet by mouth daily as needed for severe pain. (Patient taking differently: Take 0.5 tablets by mouth daily as needed for severe pain. ), Disp: 30 tablet, Rfl: 0 .  Probiotic Product (PROBIOTIC DAILY PO), Take 1 capsule by mouth daily., Disp: , Rfl:  .  progesterone (PROMETRIUM) 200 MG capsule, Take 400 mg by mouth daily. , Disp: , Rfl:  .  Testosterone 20 % CREA, Apply 1 application topically daily. , Disp: , Rfl:  .  warfarin (COUMADIN) 5 MG tablet, Take 2 tablets daily except 1 tablet on Mondays and Fridays or as directed (Patient taking differently: Take 5-10 mg by mouth See admin instructions. Take 10 mg by mouth daily except 5 mg on Mondays and Fridays), Disp: 180 tablet, Rfl: 3  PAST MEDICAL HISTORY:  Past Medical History:  Diagnosis Date  . Adrenal adenoma   . Allergy   . Anemia   . Anxiety   . CAD (coronary artery disease)    50-60% mid LAD at cardiac catheterization 2014 [facility])  . Cataract   . Depression   . DJD (degenerative joint disease) of cervical spine 09/12/2016  . History of cardiomyopathy   . History of tobacco use   . Hyperlipidemia   . Neuromuscular disorder ([place])   . PAF (paroxysmal atrial fibrillation) [facility])    Diagnosis July 2017 - spontaneously resolved  . Statin intolerance     PAST SURGICAL HISTORY: Past Surgical History:  Procedure Laterality Date  . ABDOMINAL HYSTERECTOMY  1980   endometriosis  . LEFT HEART CATH AND CORONARY ANGIOGRAPHY N/A 09/25/2018   Procedure: LEFT HEART CATH AND CORONARY ANGIOGRAPHY;  Surgeon: Belva Crome, MD;  Location: [place] [facility];  Service: Cardiovascular;  Laterality: N/A;  . TONSILLECTOMY  1954    FAMILY HISTORY: Family History  Problem Relation Age of Onset  . COPD Mother   . Hearing loss Mother   . Stroke Mother   . Heart disease Mother        chf, died at 54  . Arthritis Father   . Hypertension Father   . Heart disease Father        cad, pvd, died at 10  . Heart disease  Sister        heart valve  . Arthritis Sister   . Hyperlipidemia Brother   . Hypertension Brother   . Cancer - Prostate Brother     SOCIAL HISTORY:  Social History   Socioeconomic History  . Marital status: Married    Spouse name: Al  . Number of children: 0  . Years of education: 64  . Highest education level: Not on file  Occupational History  . 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 Financial resource strain: Not on file  . Food insecurity:    Worry: Not on file    Inability: Not on file  . Transportation needs:    Medical: Not on file    Non-medical: Not on file  Tobacco Use  . Smoking status: Former Smoker    Packs/day: 0.00    Types: Cigarettes    Start date: 11/12/1965    Last attempt to quit: 09/06/2008    Years since quitting: 10.2  . Smokeless tobacco: Never Used  Substance and Sexual Activity  . Alcohol use: Yes    Alcohol/week: 1.0 standard drinks    Types: 1 Glasses of wine per week    Comment: occ  . Drug use: Yes    Types: Marijuana    Comment: rarely  . Sexual activity: Yes    Birth control/protection: Post-menopausal  Lifestyle  . Physical activity:    Days per week: Not on file    Minutes per session: Not on file  . Stress: Not on file  Relationships  . Social connections:    Talks on phone: Not on file    Gets together: Not on file    Attends religious service: Not on file    Active member of club or organization: Not on file    Attends meetings of clubs or organizations: Not on file    Relationship status: Not on file  . Intimate partner violence:    Fear of current or ex partner: Not on file    Emotionally abused: Not on  file    Physically abused: Not on file    Forced sexual activity: Not on file  Other Topics Concern  . 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   General: The patient is well-developed and well-nourished and in no acute distress   Musculoskeletal: She has tenderness in the right mid to lower thoracic intracostal region- T6-T9.  Neurologic Exam  Mental status: The patient is alert and oriented x 3 at the time of the examination. The patient has apparent normal recent and remote memory, with an apparently normal attention span and concentration ability.   Speech is normal.  Cranial nerves: Extraocular muscles are intact.  Facial strength is normal.   . No obvious hearing deficits are noted.  Motor:  Muscle bulk is normal.   Tone is normal. Strength is  5 / 5 in her muscles..    Gait and station: Gait and station are normal.    DIAGNOSTIC DATA (LABS, IMAGING, TESTING) - I reviewed patient records, labs, notes, testing and imaging myself where available.      ASSESSMENT AND PLAN  Myofascial pain syndrome  Chronic right-sided thoracic back pain  Muscle spasm    1.  100 units of Botox was split into tender points below the T6-T9 ribs into the intercostal space.  There were no complications and she tolerated the injections well.    2.   Stay active and exercise as tolerated 3.    she will return in 3-4 months or sooner if  new or worsening neurologic symptoms.   Richard A. Felecia Shelling, MD, PhD 4/92/0100, 7:12 PM Certified in Neurology, Clinical Neurophysiology, Sleep Medicine, Pain Medicine and Neuroimaging  [facility] [address] [place], [place] [address] [phone]

## 2018-12-01 NOTE — Telephone Encounter (Signed)
Note created in error.

## 2018-12-02 DIAGNOSIS — H353132 Nonexudative age-related macular degeneration, bilateral, intermediate dry stage: Secondary | ICD-10-CM | POA: Diagnosis not present

## 2018-12-02 NOTE — Telephone Encounter (Signed)
I called to schedule the patient but she did not answer. I went ahead and made an apt for her 3 month injection. When she calls back, please make her aware of this apt date and time.  °

## 2018-12-03 ENCOUNTER — Encounter: Payer: Self-pay | Admitting: Orthopaedic Surgery

## 2018-12-03 ENCOUNTER — Ambulatory Visit (INDEPENDENT_AMBULATORY_CARE_PROVIDER_SITE_OTHER): Payer: Medicare Other | Admitting: Orthopaedic Surgery

## 2018-12-03 VITALS — BP 147/72 | HR 64 | Ht 64.0 in

## 2018-12-03 DIAGNOSIS — M25562 Pain in left knee: Secondary | ICD-10-CM

## 2018-12-03 NOTE — Progress Notes (Signed)
Patient ZR:Heather [name], female DOB:Jun 13, 1946, 73 y.o. UQJ:[id number]  Chief Complaint  Patient presents with  . Knee Pain    left    HPI  Heather [name] is a 73 y.o. female who has left knee pain.  She is better but not fully recovered.  She has pain at times and some slight swelling.  She has no giving way.  I would like to begin PT and see how she does with that.  If she continues to have problems, then I will get a MRI.   Body mass index is 27.29 kg/m.  ROS  Review of Systems  Constitutional: Positive for activity change.  Musculoskeletal: Positive for arthralgias, gait problem and joint swelling.  Allergic/Immunologic: Positive for environmental allergies.  Psychiatric/Behavioral: The patient is nervous/anxious.   All other systems reviewed and are negative.   All other systems reviewed and are negative.  The following is a summary of the past history medically, past history surgically, known current medicines, social history and family history.  This information is gathered electronically by the computer from prior information and documentation.  I review this each visit and have found including this information at this point in the chart is beneficial and informative.    Past Medical History:  Diagnosis Date  . Adrenal adenoma   . Allergy   . Anemia   . Anxiety   . CAD (coronary artery disease)    50-60% mid LAD at cardiac catheterization 2014 [facility])  . Cataract   . Depression   . DJD (degenerative joint disease) of cervical spine 09/12/2016  . History of cardiomyopathy   . History of tobacco use   . Hyperlipidemia   . Neuromuscular disorder ([place])   . PAF (paroxysmal atrial fibrillation) [facility])    Diagnosis July 2017 - spontaneously resolved  . Statin intolerance     Past Surgical History:  Procedure Laterality Date  . ABDOMINAL HYSTERECTOMY  1980   endometriosis  . LEFT HEART CATH AND CORONARY ANGIOGRAPHY N/A 09/25/2018   Procedure: LEFT HEART CATH  AND CORONARY ANGIOGRAPHY;  Surgeon: Belva Crome, MD;  Location: [place] [facility];  Service: Cardiovascular;  Laterality: N/A;  . TONSILLECTOMY  1954    Family History  Problem Relation Age of Onset  . COPD Mother   . Hearing loss Mother   . Stroke Mother   . Heart disease Mother        chf, died at 74  . Arthritis Father   . Hypertension Father   . Heart disease Father        cad, pvd, died at 71  . Heart disease Sister        heart valve  . Arthritis Sister   . Hyperlipidemia Brother   . Hypertension Brother   . Cancer - Prostate Brother     Social History Social History   Tobacco Use  . Smoking status: Former Smoker    Packs/day: 0.00    Types: Cigarettes    Start date: 11/12/1965    Last attempt to quit: 09/06/2008    Years since quitting: 10.2  . Smokeless tobacco: Never Used  Substance Use Topics  . Alcohol use: Yes    Alcohol/week: 1.0 standard drinks    Types: 1 Glasses of wine per week    Comment: occ  . Drug use: Yes    Types: Marijuana    Comment: rarely    Allergies  Allergen Reactions  . Statins Other (See Comments)    Intolerant all  statins  . Sulfa Antibiotics Rash    Current Outpatient Medications  Medication Sig Dispense Refill  . Biotin 10000 MCG TABS Take 10,000 mcg by mouth daily.     Marland Kitchen buPROPion (WELLBUTRIN SR) 150 MG 12 hr tablet Take 1 tablet (150 mg total) by mouth 2 (two) times daily. (Patient taking differently: Take 150 mg by mouth every other day. ) 180 tablet 1  . Cholecalciferol (VITAMIN D3) 125 MCG (5000 UT) TABS Take 5,000 Units by mouth daily.    . Coenzyme Q10 100 MG capsule Take 100 mg by mouth daily.     . Cyanocobalamin (VITAMIN B-12) 2500 MCG SUBL Place 2,500 mcg under the tongue daily.    Marland Kitchen docusate sodium (COLACE) 100 MG capsule Take 100 mg by mouth daily.    Marland Kitchen estradiol (ESTRACE) 1 MG tablet Take 1 mg by mouth daily.     Marland Kitchen estradiol (ESTRACE) 2 MG tablet Take 2 mg by mouth daily.    . NP THYROID 120 MG tablet Take  120 mg by mouth daily.    Marland Kitchen oxyCODONE-acetaminophen (ROXICET) 5-325 MG tablet Take 1 tablet by mouth daily as needed for severe pain. (Patient taking differently: Take 0.5 tablets by mouth daily as needed for severe pain. ) 30 tablet 0  . Probiotic Product (PROBIOTIC DAILY PO) Take 1 capsule by mouth daily.    . progesterone (PROMETRIUM) 200 MG capsule Take 400 mg by mouth daily.     . Testosterone 20 % CREA Apply 1 application topically daily.     Marland Kitchen warfarin (COUMADIN) 5 MG tablet Take 2 tablets daily except 1 tablet on Mondays and Fridays or as directed (Patient taking differently: Take 5-10 mg by mouth See admin instructions. Take 10 mg by mouth daily except 5 mg on Mondays and Fridays) 180 tablet 3  . Evolocumab (REPATHA) 140 MG/ML SOSY Inject 140 mg into the skin every 14 (fourteen) days. (Patient not taking: Reported on 12/03/2018) 2 Syringe 11   No current facility-administered medications for this visit.      Physical Exam  Blood pressure (!) 147/72, pulse 64, height 5\' 4"  (1.626 m), last menstrual period 07/08/1979.  Constitutional: overall normal hygiene, normal nutrition, well developed, normal grooming, normal body habitus. Assistive device:none  Musculoskeletal: gait and station Limp slight left, muscle tone and strength are normal, no tremors or atrophy is present.  .  Neurological: coordination overall normal.  Deep tendon reflex/nerve stretch intact.  Sensation normal.  Cranial nerves II-XII intact.   Skin:   Normal overall no scars, lesions, ulcers or rashes. No psoriasis.  Psychiatric: Alert and oriented x 3.  Recent memory intact, remote memory unclear.  Normal mood and affect. Well groomed.  Good eye contact.  Cardiovascular: overall no swelling, no varicosities, no edema bilaterally, normal temperatures of the legs and arms, no clubbing, cyanosis and good capillary refill.  Lymphatic: palpation is normal.  Left knee has slight tenderness medially.  She has slight  effusion, ROM 0 to 115, stable knee, NV intact.   All other systems reviewed and are negative   The patient has been educated about the nature of the problem(s) and counseled on treatment options.  The patient appeared to understand what I have discussed and is in agreement with it.  Encounter Diagnosis  Name Primary?  . Acute pain of left knee Yes    PLAN Call if any problems.  Precautions discussed.  Continue current medications.   Return to clinic 3 weeks   Begin PT.  Electronically Signed Sanjuana Kava, MD 1/22/20202:14 PM

## 2018-12-03 NOTE — Progress Notes (Signed)
kne

## 2018-12-09 ENCOUNTER — Telehealth: Payer: Self-pay | Admitting: Orthopaedic Surgery

## 2018-12-09 DIAGNOSIS — M25562 Pain in left knee: Secondary | ICD-10-CM

## 2018-12-09 NOTE — Telephone Encounter (Signed)
Patient left message on voicemail stating that it took a few days before she got in touch with Physical Therapy and she hasn't schedule anything as of yet. She states her knee is swollen some and has a burning sensation, and she would like to get the MRI first and then go from there.  Please call and advise  [phone]

## 2018-12-10 NOTE — Telephone Encounter (Signed)
Pt has only been seen twice, is it ok to go ahead and order MRI?

## 2018-12-10 NOTE — Telephone Encounter (Signed)
I had wanted PT but if she is still hurting, get MRI if insurance will allow it.  If they don't, then do the PT.

## 2018-12-17 ENCOUNTER — Ambulatory Visit: Payer: Medicare Other | Admitting: Cardiology

## 2018-12-22 ENCOUNTER — Ambulatory Visit (HOSPITAL_COMMUNITY)
Admission: RE | Admit: 2018-12-22 | Discharge: 2018-12-22 | Disposition: A | Discharge status: Home / Self Care | Payer: Medicare Other | Source: Ambulatory Visit | Attending: Orthopaedic Surgery | Admitting: Orthopaedic Surgery

## 2018-12-22 DIAGNOSIS — M25562 Pain in left knee: Secondary | ICD-10-CM | POA: Diagnosis not present

## 2018-12-22 IMAGING — MR MR KNEE*L* W/O CM
4 of 6 series · 16 of 40 positions shown · non-contrast
Comparison: Radiographs [DATE]

CLINICAL DATA: Left knee pain for 1 month.  No known injury.

EXAM:
MRI OF THE LEFT KNEE WITHOUT CONTRAST
TECHNIQUE: Multiplanar, multisequence MR imaging of the knee was performed. No
intravenous contrast was administered.

[Series 3: t2fs axial · axial · 4.0mm · 0.24mm/px · z∈[-80,+19]mm · 4 of 48 slices shown]
[im 1/48]
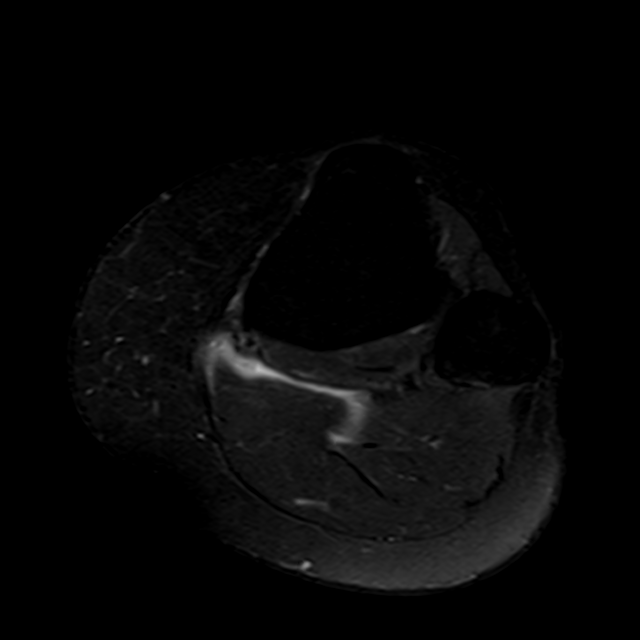
[im 6/48]
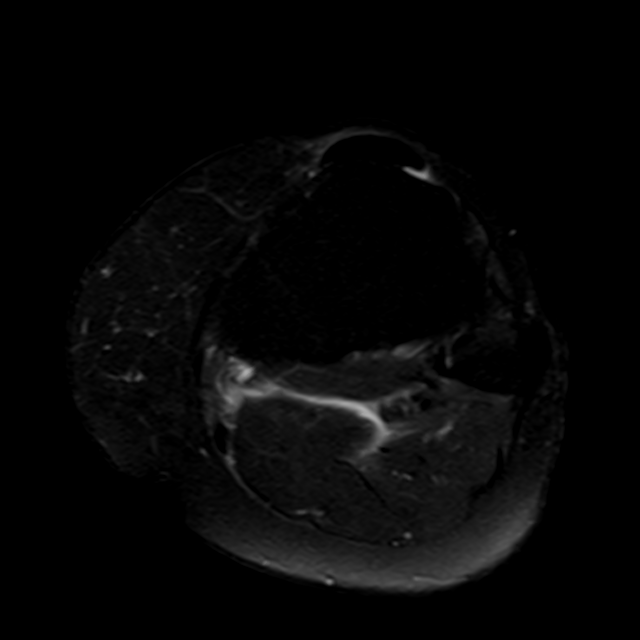
[im 27/48]
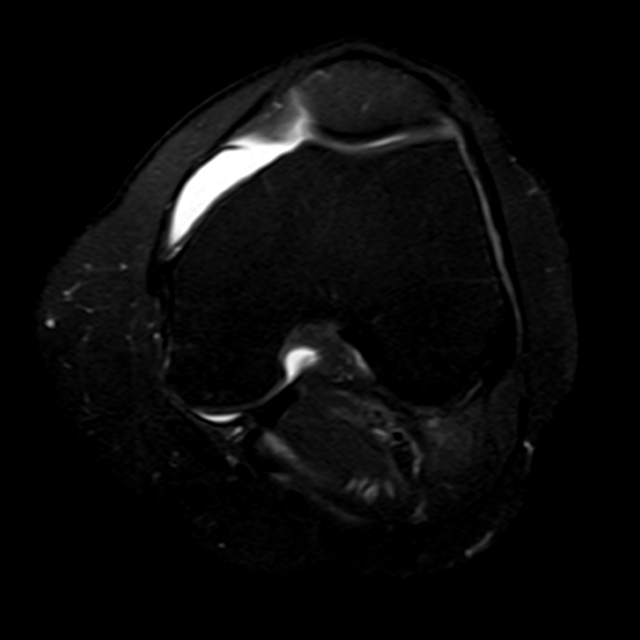
[im 42/48]
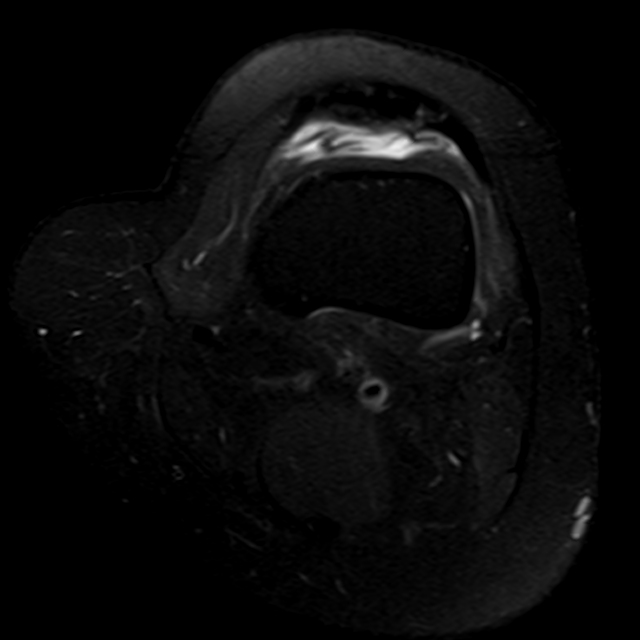

[Series 4: T1 · coronal · 4.0mm · 0.28mm/px · 6 of 28 slices shown]
[im 1/28]
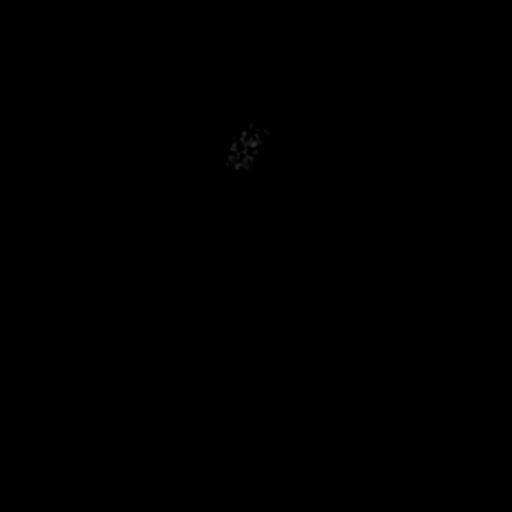
[im 6/28]
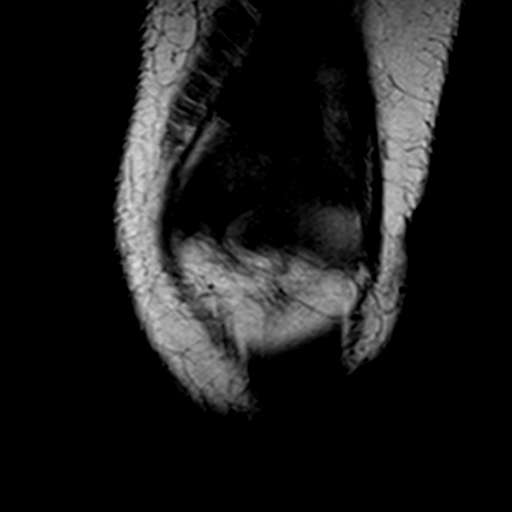
[im 11/28]
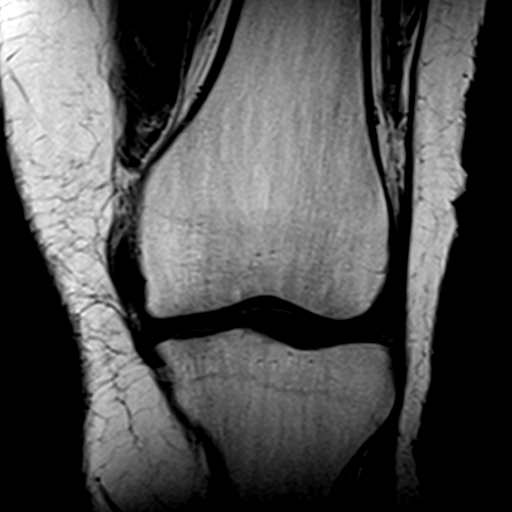
[im 17/28]
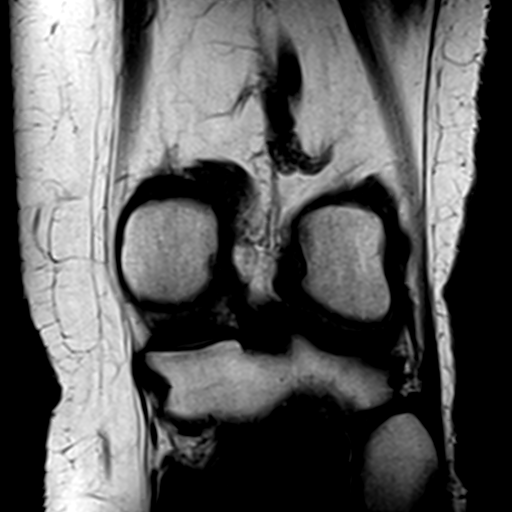
[im 22/28]
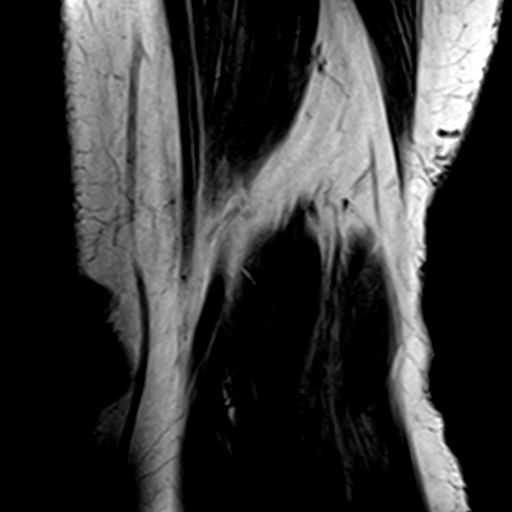
[im 28/28]
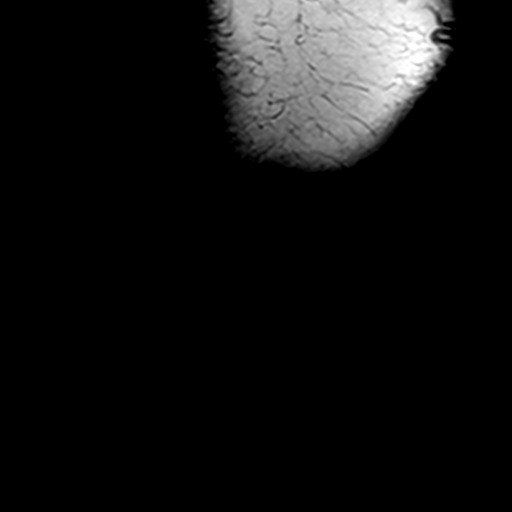

[Series 5: pdfs sag · sagittal · 3.0mm · 0.21mm/px · 3 of 29 slices shown]
[im 6/29]
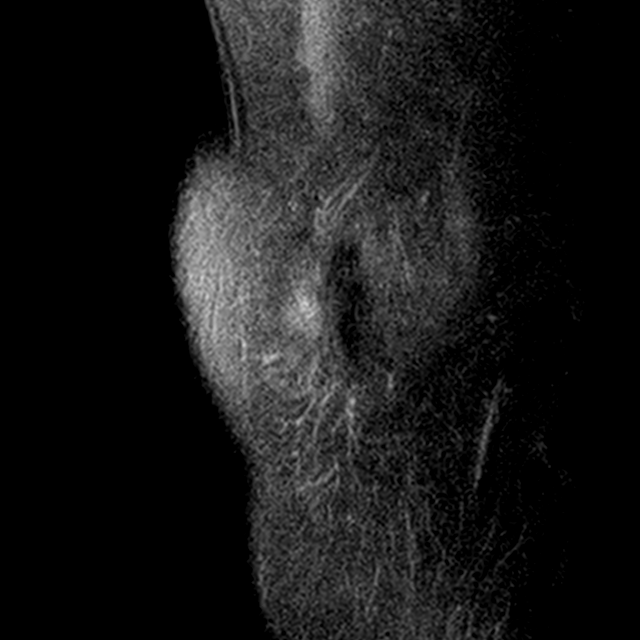
[im 17/29]
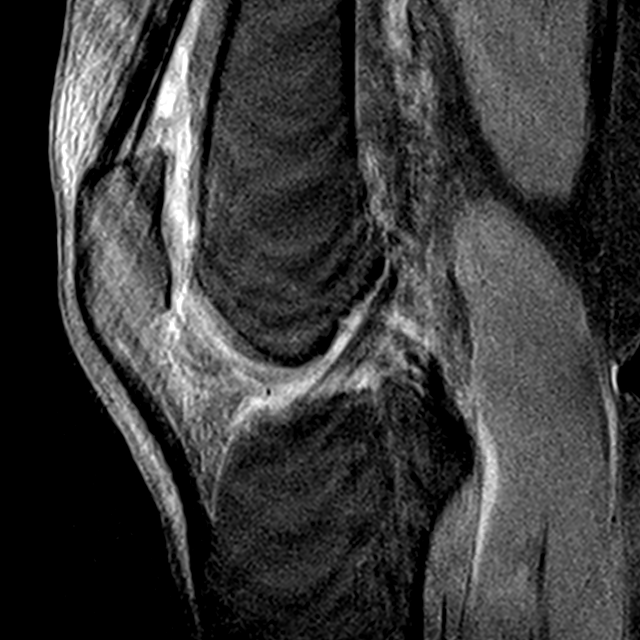
[im 29/29]
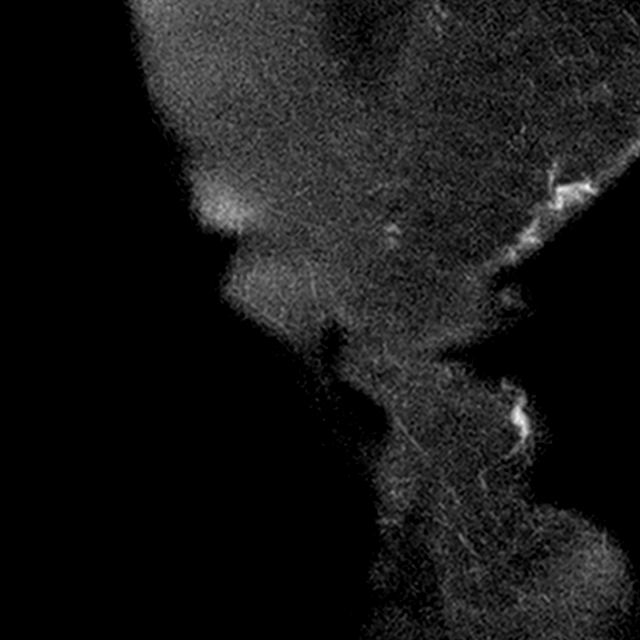

[Series 6: t2fs cor · coronal · 4.0mm · 0.30mm/px · 3 of 27 slices shown]
[im 1/27]
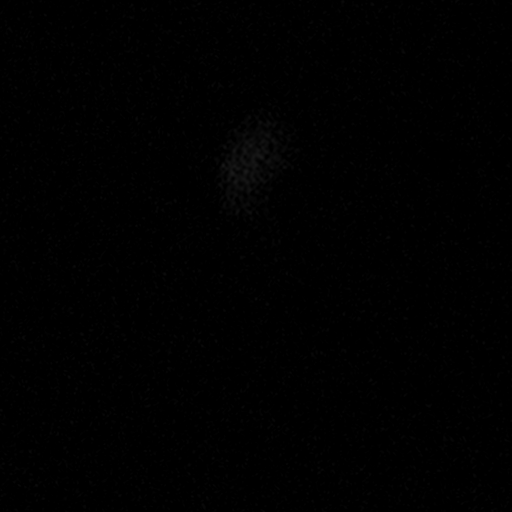
[im 14/27]
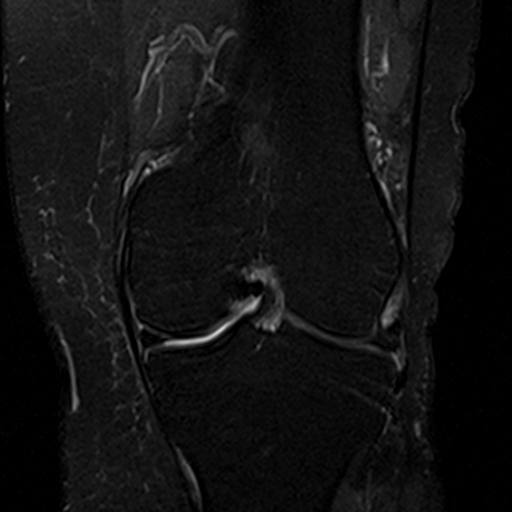
[im 27/27]
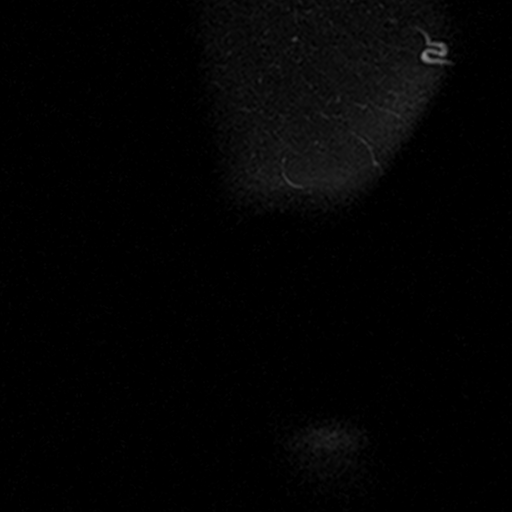

[16 of 40 positions shown; findings below may reference images not displayed]

FINDINGS: Despite efforts by the technologist and patient, mild motion
artifact is present on today's exam and could not be eliminated.
This reduces exam sensitivity and specificity.

MENISCI

Medial meniscus:  Intact with normal morphology.

Lateral meniscus: There is intrasubstance signal anteriorly in the
lateral meniscus, best seen on the coronal images. The sagittal
images are motion degraded. No well-defined meniscal tear or
displaced meniscal fragment identified.

LIGAMENTS

Cruciates:  Intact.

Collaterals:  Intact.

CARTILAGE

Patellofemoral: Minimal fissuring of the patellar cartilage just
medial to the apex. No subchondral signal abnormality.

Medial:  Preserved.

Lateral:  Preserved.

MISCELLANEOUS

Joint:  Small joint effusion.

Popliteal Fossa: Small Baker's cyst may be partially ruptured into
the proximal calf.

Extensor Mechanism:  Intact.

Bones:  No acute or significant extra-articular osseous findings.

Other: There are possible small ganglia anteriorly in the
intercondylar notch, best seen on the axial images.
IMPRESSION: 1. Small joint effusion and small partially ruptured Baker's cyst.
2. Intrasubstance signal in the anterior horn of the lateral
meniscus, probably degenerative. No definite meniscal tear. Meniscal
evaluation limited by motion.
3. Minimal patellar chondromalacia.  No acute osseous findings.

## 2018-12-22 NOTE — Telephone Encounter (Signed)
Yes, generally the ophthalmologists do not hold anticoagulation for cataract surgery.

## 2018-12-25 ENCOUNTER — Encounter: Payer: Self-pay | Admitting: Orthopaedic Surgery

## 2018-12-25 ENCOUNTER — Telehealth (HOSPITAL_COMMUNITY): Payer: Self-pay | Admitting: Internal Medicine

## 2018-12-25 ENCOUNTER — Ambulatory Visit (INDEPENDENT_AMBULATORY_CARE_PROVIDER_SITE_OTHER): Payer: Medicare Other | Admitting: Orthopaedic Surgery

## 2018-12-25 ENCOUNTER — Ambulatory Visit (HOSPITAL_COMMUNITY): Payer: Medicare Other | Admitting: Physical Therapy

## 2018-12-25 VITALS — BP 129/77 | HR 65 | Ht 64.0 in

## 2018-12-25 DIAGNOSIS — M25562 Pain in left knee: Secondary | ICD-10-CM | POA: Diagnosis not present

## 2018-12-25 NOTE — Progress Notes (Signed)
Patient ON:Heather [name], female DOB:18-Jun-1946, 73 y.o. WUX:[id number]  Chief Complaint  Patient presents with  . Knee Pain    left/ feels better   . Results    review MRI     HPI  Heather [name] is a 73 y.o. female who has left knee pain. She had a MRI and it showed: IMPRESSION: 1. Small joint effusion and small partially ruptured Baker's cyst. 2. Intrasubstance signal in the anterior horn of the lateral meniscus, probably degenerative. No definite meniscal tear. Meniscal evaluation limited by motion. 3. Minimal patellar chondromalacia.  No acute osseous findings.  I have explained the findings to her.  I do not recommend arthroscopy at this time.  She is better.  She is to take Advil or Aleve as needed.   Body mass index is 27.29 kg/m.  ROS  Review of Systems  Constitutional: Positive for activity change.  Musculoskeletal: Positive for arthralgias, gait problem and joint swelling.  Allergic/Immunologic: Positive for environmental allergies.  Psychiatric/Behavioral: The patient is nervous/anxious.   All other systems reviewed and are negative.   All other systems reviewed and are negative.  The following is a summary of the past history medically, past history surgically, known current medicines, social history and family history.  This information is gathered electronically by the computer from prior information and documentation.  I review this each visit and have found including this information at this point in the chart is beneficial and informative.    Past Medical History:  Diagnosis Date  . Adrenal adenoma   . Allergy   . Anemia   . Anxiety   . CAD (coronary artery disease)    50-60% mid LAD at cardiac catheterization 2014 [facility])  . Cataract   . Depression   . DJD (degenerative joint disease) of cervical spine 09/12/2016  . History of cardiomyopathy   . History of tobacco use   . Hyperlipidemia   . Neuromuscular disorder ([place])   . PAF (paroxysmal  atrial fibrillation) [facility])    Diagnosis July 2017 - spontaneously resolved  . Statin intolerance     Past Surgical History:  Procedure Laterality Date  . ABDOMINAL HYSTERECTOMY  1980   endometriosis  . LEFT HEART CATH AND CORONARY ANGIOGRAPHY N/A 09/25/2018   Procedure: LEFT HEART CATH AND CORONARY ANGIOGRAPHY;  Surgeon: Belva Crome, MD;  Location: [facility];  Service: Cardiovascular;  Laterality: N/A;  . TONSILLECTOMY  1954    Family History  Problem Relation Age of Onset  . COPD Mother   . Hearing loss Mother   . Stroke Mother   . Heart disease Mother        chf, died at 28  . Arthritis Father   . Hypertension Father   . Heart disease Father        cad, pvd, died at 70  . Heart disease Sister        heart valve  . Arthritis Sister   . Hyperlipidemia Brother   . Hypertension Brother   . Cancer - Prostate Brother     Social History Social History   Tobacco Use  . Smoking status: Former Smoker    Packs/day: 0.00    Types: Cigarettes    Start date: 11/12/1965    Last attempt to quit: 09/06/2008    Years since quitting: 10.3  . Smokeless tobacco: Never Used  Substance Use Topics  . Alcohol use: Yes    Alcohol/week: 1.0 standard drinks    Types: 1 Glasses of  wine per week    Comment: occ  . Drug use: Yes    Types: Marijuana    Comment: rarely    Allergies  Allergen Reactions  . Statins Other (See Comments)    Intolerant all statins  . Sulfa Antibiotics Rash    Current Outpatient Medications  Medication Sig Dispense Refill  . Biotin 10000 MCG TABS Take 10,000 mcg by mouth daily.     Marland Kitchen buPROPion (WELLBUTRIN SR) 150 MG 12 hr tablet Take 1 tablet (150 mg total) by mouth 2 (two) times daily. (Patient taking differently: Take 150 mg by mouth every other day. ) 180 tablet 1  . Cholecalciferol (VITAMIN D3) 125 MCG (5000 UT) TABS Take 5,000 Units by mouth daily.    . Coenzyme Q10 100 MG capsule Take 100 mg by mouth daily.     . Cyanocobalamin (VITAMIN  B-12) 2500 MCG SUBL Place 2,500 mcg under the tongue daily.    Marland Kitchen docusate sodium (COLACE) 100 MG capsule Take 100 mg by mouth daily.    Marland Kitchen estradiol (ESTRACE) 1 MG tablet Take 1 mg by mouth daily.     Marland Kitchen estradiol (ESTRACE) 2 MG tablet Take 2 mg by mouth daily.    . NP THYROID 120 MG tablet Take 120 mg by mouth daily.    Marland Kitchen oxyCODONE-acetaminophen (ROXICET) 5-325 MG tablet Take 1 tablet by mouth daily as needed for severe pain. (Patient taking differently: Take 0.5 tablets by mouth daily as needed for severe pain. ) 30 tablet 0  . Probiotic Product (PROBIOTIC DAILY PO) Take 1 capsule by mouth daily.    . progesterone (PROMETRIUM) 200 MG capsule Take 400 mg by mouth daily.     . Testosterone 20 % CREA Apply 1 application topically daily.     Marland Kitchen warfarin (COUMADIN) 5 MG tablet Take 2 tablets daily except 1 tablet on Mondays and Fridays or as directed (Patient taking differently: Take 5-10 mg by mouth See admin instructions. Take 10 mg by mouth daily except 5 mg on Mondays and Fridays) 180 tablet 3  . Evolocumab (REPATHA) 140 MG/ML SOSY Inject 140 mg into the skin every 14 (fourteen) days. (Patient not taking: Reported on 12/03/2018) 2 Syringe 11   No current facility-administered medications for this visit.      Physical Exam  Blood pressure 129/77, pulse 65, height 5\' 4"  (1.626 m), last menstrual period 07/08/1979.  Constitutional: overall normal hygiene, normal nutrition, well developed, normal grooming, normal body habitus. Assistive device:none  Musculoskeletal: gait and station Limp none, muscle tone and strength are normal, no tremors or atrophy is present.  .  Neurological: coordination overall normal.  Deep tendon reflex/nerve stretch intact.  Sensation normal.  Cranial nerves II-XII intact.   Skin:   Normal overall no scars, lesions, ulcers or rashes. No psoriasis.  Psychiatric: Alert and oriented x 3.  Recent memory intact, remote memory unclear.  Normal mood and affect. Well groomed.   Good eye contact.  Cardiovascular: overall no swelling, no varicosities, no edema bilaterally, normal temperatures of the legs and arms, no clubbing, cyanosis and good capillary refill.  Lymphatic: palpation is normal.  Left knee has no effusion or pain today.  NV intact.  All other systems reviewed and are negative   The patient has been educated about the nature of the problem(s) and counseled on treatment options.  The patient appeared to understand what I have discussed and is in agreement with it.  No diagnosis found.  PLAN Call if any problems.  Precautions discussed.  Continue current medications.   Return to clinic 1 month   Electronically Signed Sanjuana Kava, MD 2/13/202010:52 AM

## 2018-12-25 NOTE — Telephone Encounter (Signed)
12/25/18  pt left a message to cx said that she saw Dr. Luna Glasgow and he advised that she not come to the 1:45 appt... she has to have a MRI and wouldn't benefit from therapy

## 2018-12-26 ENCOUNTER — Telehealth: Payer: Self-pay

## 2018-12-26 NOTE — Telephone Encounter (Signed)
Called pt to notify them that they are overdue for their coumadin appt left msg

## 2019-01-01 DIAGNOSIS — H353132 Nonexudative age-related macular degeneration, bilateral, intermediate dry stage: Secondary | ICD-10-CM | POA: Diagnosis not present

## 2019-01-05 DIAGNOSIS — H2512 Age-related nuclear cataract, left eye: Secondary | ICD-10-CM | POA: Diagnosis not present

## 2019-01-19 ENCOUNTER — Encounter (INDEPENDENT_AMBULATORY_CARE_PROVIDER_SITE_OTHER): Payer: Self-pay | Admitting: Internal Medicine

## 2019-01-20 DIAGNOSIS — E785 Hyperlipidemia, unspecified: Secondary | ICD-10-CM | POA: Diagnosis not present

## 2019-01-20 DIAGNOSIS — M25569 Pain in unspecified knee: Secondary | ICD-10-CM | POA: Diagnosis not present

## 2019-01-20 DIAGNOSIS — N951 Menopausal and female climacteric states: Secondary | ICD-10-CM | POA: Diagnosis not present

## 2019-01-20 DIAGNOSIS — R5383 Other fatigue: Secondary | ICD-10-CM | POA: Diagnosis not present

## 2019-01-27 ENCOUNTER — Encounter (HOSPITAL_COMMUNITY): Payer: Self-pay

## 2019-01-27 ENCOUNTER — Ambulatory Visit (HOSPITAL_COMMUNITY): Payer: Medicare Other | Admitting: Physical Therapy

## 2019-01-28 ENCOUNTER — Ambulatory Visit: Payer: Medicare Other | Admitting: Orthopaedic Surgery

## 2019-01-30 ENCOUNTER — Telehealth: Payer: Self-pay | Admitting: Cardiology

## 2019-01-30 ENCOUNTER — Other Ambulatory Visit: Payer: Self-pay

## 2019-01-30 ENCOUNTER — Encounter (HOSPITAL_COMMUNITY): Payer: Self-pay | Admitting: Emergency Medicine

## 2019-01-30 ENCOUNTER — Emergency Department (HOSPITAL_COMMUNITY)
Admission: EM | Admit: 2019-01-30 | Discharge: 2019-01-30 | Disposition: A | Discharge status: Home / Self Care | Payer: Medicare Other | Attending: Emergency Medicine | Admitting: Emergency Medicine

## 2019-01-30 ENCOUNTER — Emergency Department (HOSPITAL_COMMUNITY): Payer: Medicare Other

## 2019-01-30 DIAGNOSIS — Z79899 Other long term (current) drug therapy: Secondary | ICD-10-CM | POA: Diagnosis not present

## 2019-01-30 DIAGNOSIS — M79662 Pain in left lower leg: Secondary | ICD-10-CM | POA: Diagnosis not present

## 2019-01-30 DIAGNOSIS — M79605 Pain in left leg: Secondary | ICD-10-CM | POA: Diagnosis present

## 2019-01-30 DIAGNOSIS — Z7901 Long term (current) use of anticoagulants: Secondary | ICD-10-CM | POA: Insufficient documentation

## 2019-01-30 DIAGNOSIS — I259 Chronic ischemic heart disease, unspecified: Secondary | ICD-10-CM | POA: Diagnosis not present

## 2019-01-30 DIAGNOSIS — Z87891 Personal history of nicotine dependence: Secondary | ICD-10-CM | POA: Diagnosis not present

## 2019-01-30 LAB — PROTIME-INR
INR: 2.9 — AB (ref 0.8–1.2)
Prothrombin Time: 30.2 seconds — ABNORMAL HIGH (ref 11.4–15.2)

## 2019-01-30 IMAGING — US VENOUS DOPPLER ULTRASOUND OF LEFT LOWER EXTREMITY
1 series · 14 of 24 positions shown · non-contrast
Comparison: None

CLINICAL DATA: Pain and edema post trauma in [REDACTED]

EXAM:
LEFT LOWER EXTREMITY VENOUS DOPPLER ULTRASOUND
TECHNIQUE: Gray-scale sonography with compression, as well as color and duplex
ultrasound, were performed to evaluate the deep venous system from
the level of the common femoral vein through the popliteal and
proximal calf veins.

[Series 1: venous doppler ultrasound of left lower extremity · 14 of 41 slices shown]
[im 1/41]
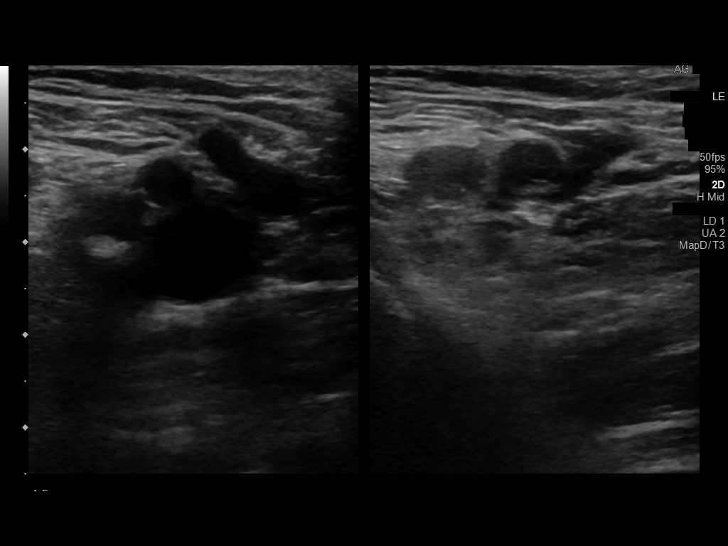
[im 4/41]
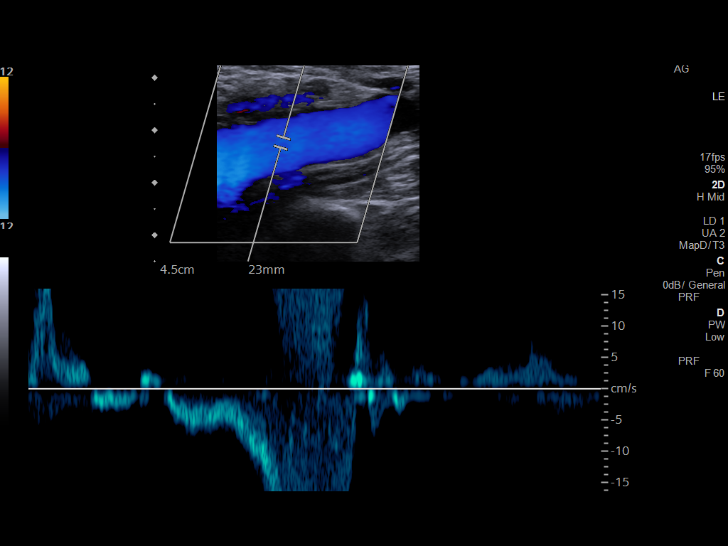
[im 7/41]
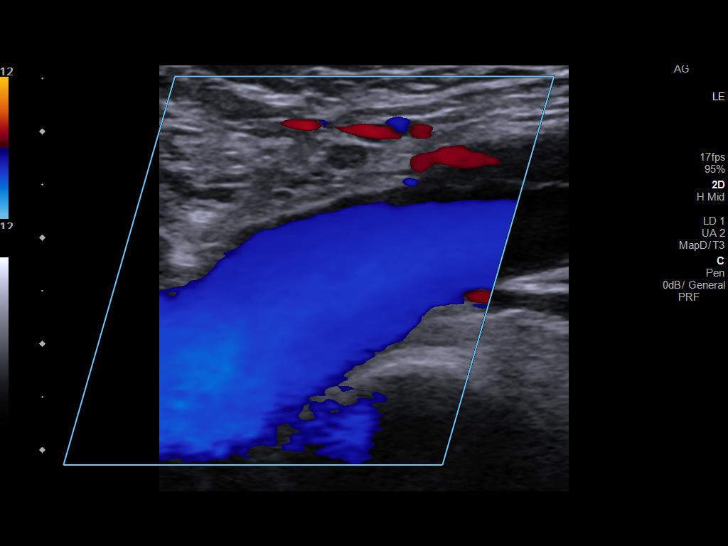
[im 11/41]
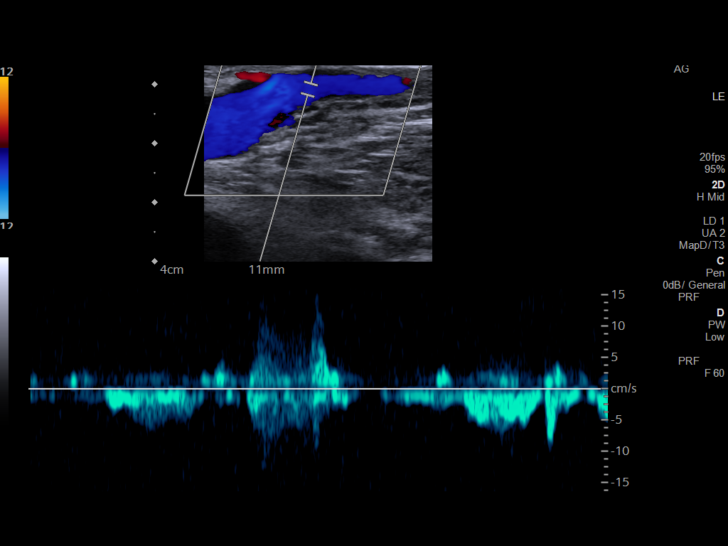
[im 13/41]
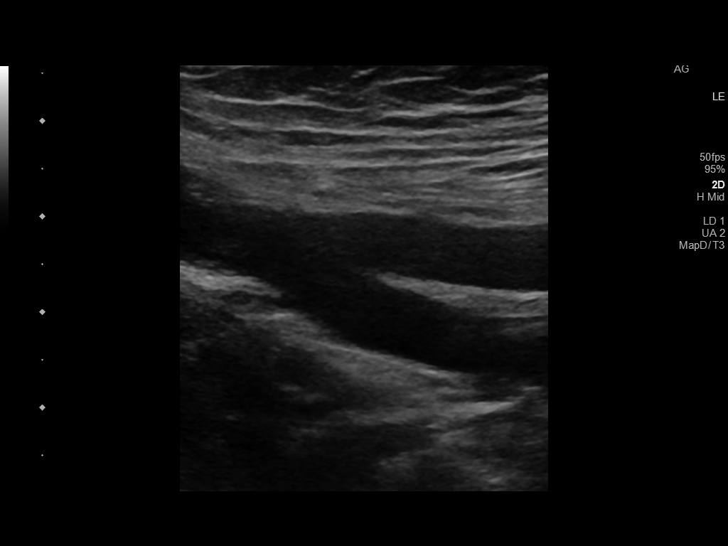
[im 16/41]
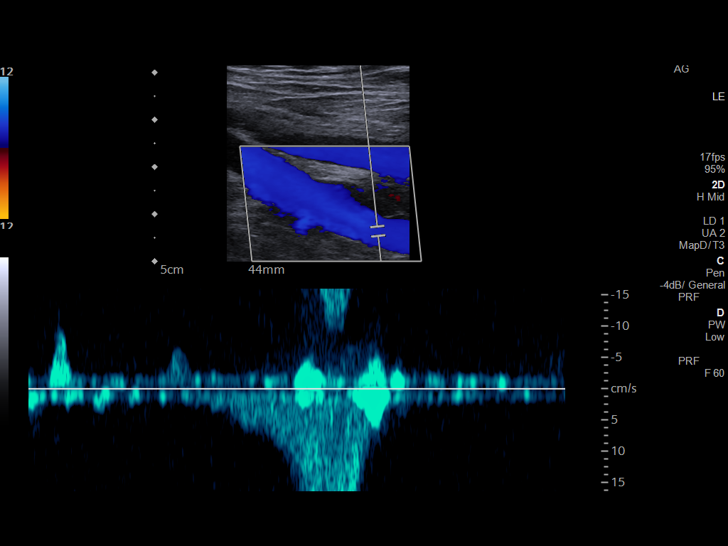
[im 20/41]
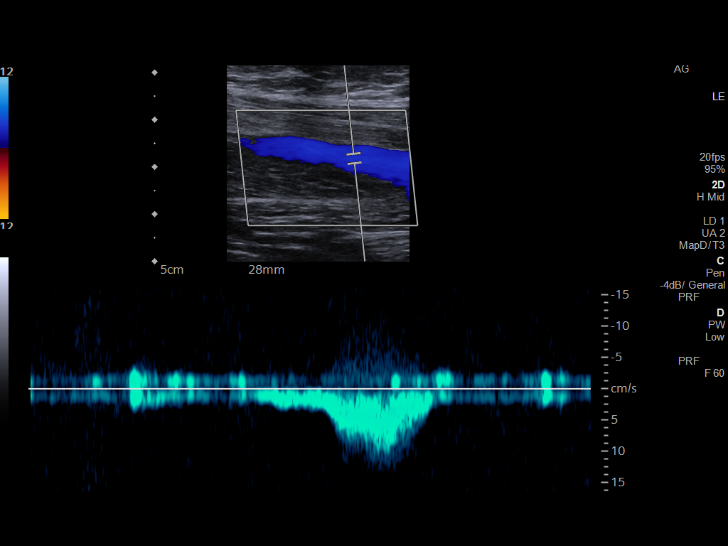
[im 21/41]
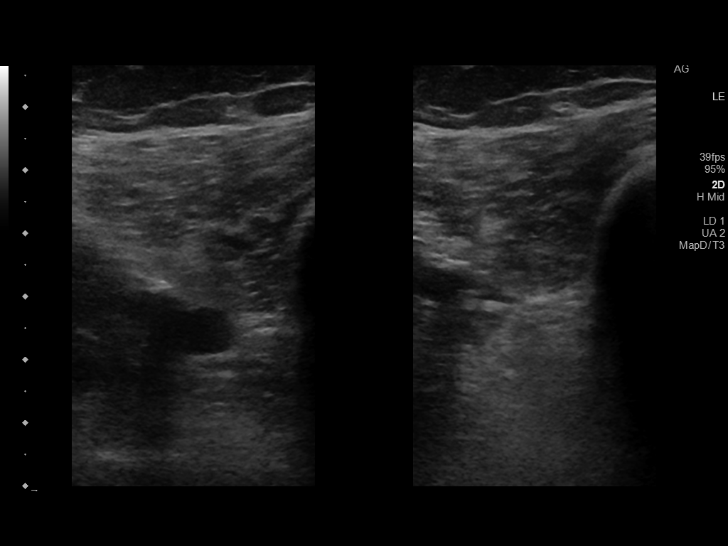
[im 25/41]
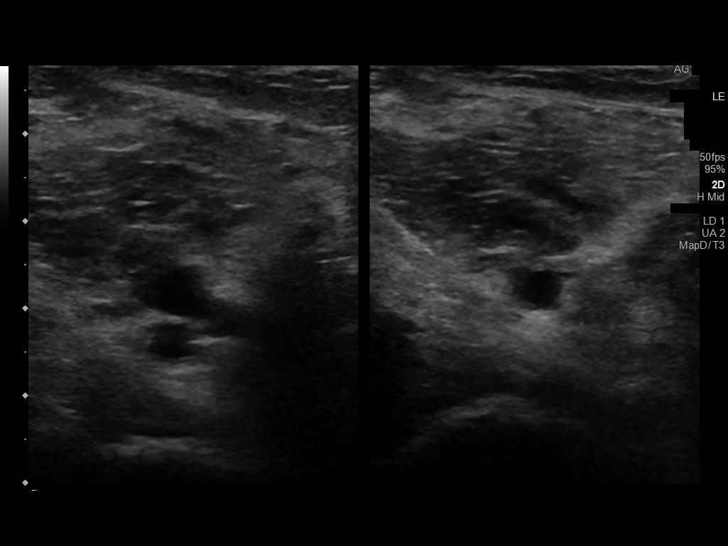
[im 28/41]
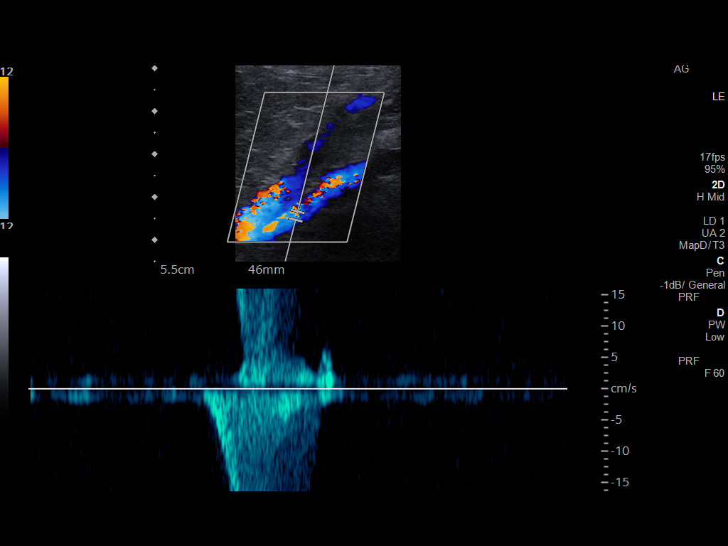
[im 32/41]
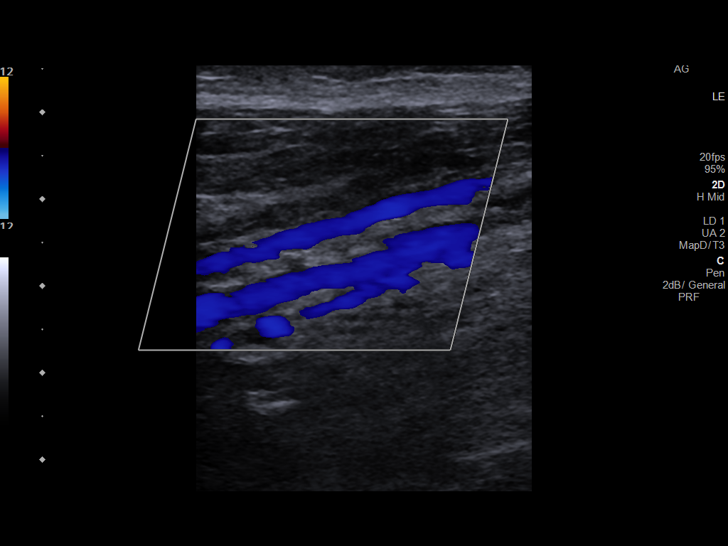
[im 34/41]
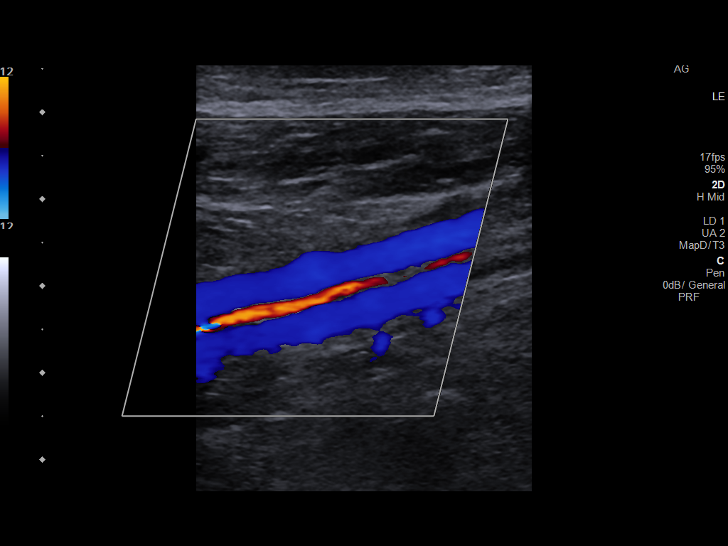
[im 37/41]
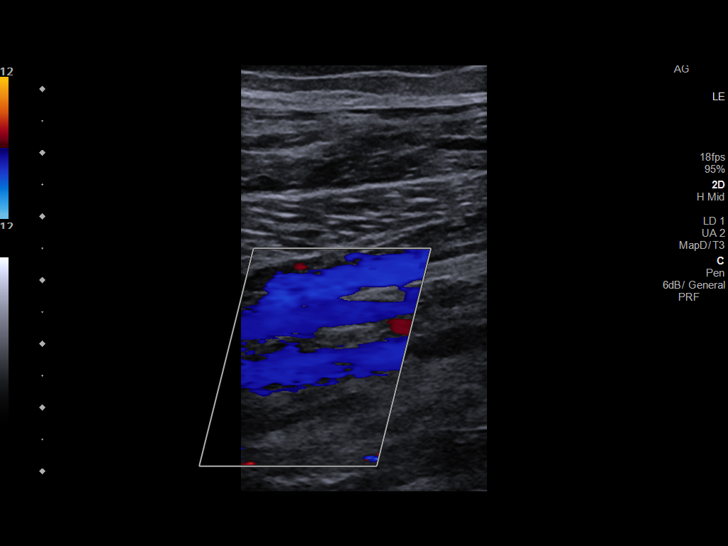
[im 41/41]
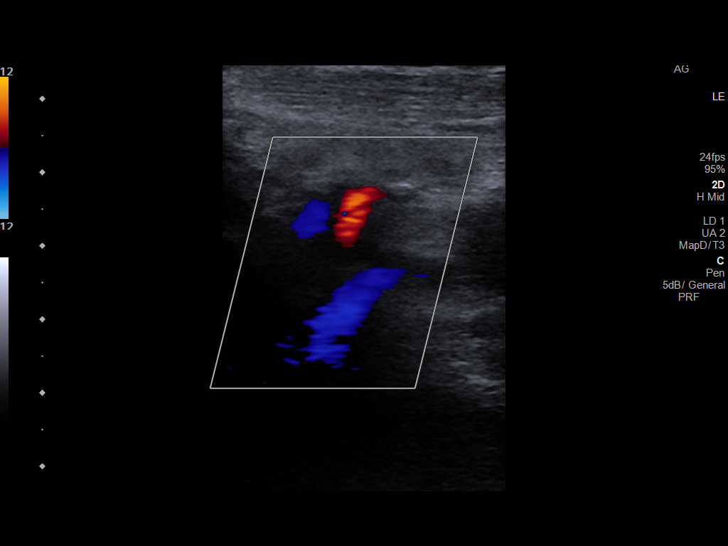

[14 of 24 positions shown; findings below may reference images not displayed]

FINDINGS: Normal compressibility of the common femoral, superficial femoral,
and popliteal veins, as well as the proximal calf veins. No filling
defects to suggest DVT on grayscale or color Doppler imaging.
Doppler waveforms show normal direction of venous flow, normal
respiratory phasicity and response to augmentation. Visualized
segments of the saphenous venous system normal in caliber and
compressibility. Survey views of the contralateral common femoral
vein are unremarkable.
IMPRESSION: No femoropopliteal and no calf DVT in the visualized calf veins. If
clinical symptoms are inconsistent or if there are persistent or
worsening symptoms, further imaging (possibly involving the iliac
veins) may be warranted.

## 2019-01-30 NOTE — Telephone Encounter (Signed)
Patient states that she believes is having DVT and would like to be seen in office to rule out to avoid going to ER. / tg

## 2019-01-30 NOTE — Telephone Encounter (Signed)
Went for walk yesterday, now has swelling in left calf and knee, had knee injury in [place] MRI and steroid injection. Now has pain and swelling and is concerned for DVT.She will go to ED for evaluation

## 2019-01-30 NOTE — ED Provider Notes (Signed)
[facility] EMERGENCY DEPARTMENT Provider Note   CSN: [id number] Arrival date & time: 01/30/19  1226    History   Chief Complaint Chief Complaint  Patient presents with  . Knee Pain    HPI Heather [name] is a 73 y.o. female.     The history is provided by the patient. No language interpreter was used.  Knee Pain  Location:  Leg Time since incident:  4 days Injury: no   Leg location:  L leg Pain details:    Quality:  Aching   Severity:  No pain   Timing:  Constant   Progression:  Worsening Chronicity:  New Relieved by:  Nothing Worsened by:  Nothing Ineffective treatments:  None tried Pt reports she has pain in the back of her leg in the calf area.  Pt's MD advised her to come in to be seen for evaluation of DVT.   Past Medical History:  Diagnosis Date  . Adrenal adenoma   . Allergy   . Anemia   . Anxiety   . CAD (coronary artery disease)    50-60% mid LAD at cardiac catheterization 2014 [facility])  . Cataract   . Depression   . DJD (degenerative joint disease) of cervical spine 09/12/2016  . History of cardiomyopathy   . History of tobacco use   . Hyperlipidemia   . Neuromuscular disorder ([place])   . PAF (paroxysmal atrial fibrillation) [facility])    Diagnosis July 2017 - spontaneously resolved  . Statin intolerance     Patient Active Problem List   Diagnosis Date Noted  . Abnormal myocardial perfusion study   . Muscle spasm 02/25/2018  . Tubular adenoma of colon 07/19/2017  . H. pylori infection 07/19/2017  . Thoracic back pain 02/08/2017  . Hx of colonic polyps 12/03/2016  . Family history of colon cancer 12/03/2016  . Encounter for therapeutic drug monitoring 11/21/2016  . CAD (coronary artery disease) 09/12/2016  . PAF (paroxysmal atrial fibrillation) ([place]) 09/12/2016  . Syncope 09/12/2016  . DJD (degenerative joint disease) of cervical spine 09/12/2016  . Statin intolerance 09/12/2016  . TIA (transient ischemic attack) 09/12/2016  .  Diverticulitis of colon 09/12/2016  . Myofascial pain syndrome 09/06/2016  . Takotsubo syndrome 09/06/2016  . HLD (hyperlipidemia) 09/06/2016  . Depression with anxiety 09/06/2016  . Recurrent UTI (urinary tract infection) 09/06/2016    Past Surgical History:  Procedure Laterality Date  . ABDOMINAL HYSTERECTOMY  1980   endometriosis  . LEFT HEART CATH AND CORONARY ANGIOGRAPHY N/A 09/25/2018   Procedure: LEFT HEART CATH AND CORONARY ANGIOGRAPHY;  Surgeon: Belva Crome, MD;  Location: [facility];  Service: Cardiovascular;  Laterality: N/A;  . TONSILLECTOMY  1954     OB History    Gravida  1   Para  0   Term      Preterm      AB  1   Living  0     SAB      TAB  1   Ectopic      Multiple      Live Births               Home Medications    Prior to Admission medications   Medication Sig Start Date End Date Taking? Authorizing Provider  Biotin 10000 MCG TABS Take 10,000 mcg by mouth daily.     [provider]  buPROPion (WELLBUTRIN SR) 150 MG 12 hr tablet Take 1 tablet (150 mg total) by mouth  2 (two) times daily. Patient taking differently: Take 150 mg by mouth every other day.  02/07/18   Raylene Everts, MD  Cholecalciferol (VITAMIN D3) 125 MCG (5000 UT) TABS Take 5,000 Units by mouth daily.    [provider]  Coenzyme Q10 100 MG capsule Take 100 mg by mouth daily.     [provider]  Cyanocobalamin (VITAMIN B-12) 2500 MCG SUBL Place 2,500 mcg under the tongue daily.    [provider]  docusate sodium (COLACE) 100 MG capsule Take 100 mg by mouth daily.    [provider]  estradiol (ESTRACE) 1 MG tablet Take 1 mg by mouth daily.     [provider]  estradiol (ESTRACE) 2 MG tablet Take 2 mg by mouth daily.    [provider]  Evolocumab (REPATHA) 140 MG/ML SOSY Inject 140 mg into the skin every 14 (fourteen) days. Patient not taking: Reported on 12/03/2018 09/18/18   Satira Sark,  MD  NP THYROID 120 MG tablet Take 120 mg by mouth daily. 11/25/18   [provider]  oxyCODONE-acetaminophen (ROXICET) 5-325 MG tablet Take 1 tablet by mouth daily as needed for severe pain. Patient taking differently: Take 0.5 tablets by mouth daily as needed for severe pain.  08/27/18   Sater, Nanine Means, MD  Probiotic Product (PROBIOTIC DAILY PO) Take 1 capsule by mouth daily.    [provider]  progesterone (PROMETRIUM) 200 MG capsule Take 400 mg by mouth daily.     [provider]  Testosterone 20 % CREA Apply 1 application topically daily.     [provider]  warfarin (COUMADIN) 5 MG tablet Take 2 tablets daily except 1 tablet on Mondays and Fridays or as directed Patient taking differently: Take 5-10 mg by mouth See admin instructions. Take 10 mg by mouth daily except 5 mg on Mondays and Fridays 08/12/18   Satira Sark, MD    Family History Family History  Problem Relation Age of Onset  . COPD Mother   . Hearing loss Mother   . Stroke Mother   . Heart disease Mother        chf, died at 27  . Arthritis Father   . Hypertension Father   . Heart disease Father        cad, pvd, died at 89  . Heart disease Sister        heart valve  . Arthritis Sister   . Hyperlipidemia Brother   . Hypertension Brother   . Cancer - Prostate Brother     Social History Social History   Tobacco Use  . Smoking status: Former Smoker    Packs/day: 0.00    Types: Cigarettes    Start date: 11/12/1965    Last attempt to quit: 09/06/2008    Years since quitting: 10.4  . Smokeless tobacco: Never Used  Substance Use Topics  . Alcohol use: Yes    Alcohol/week: 1.0 standard drinks    Types: 1 Glasses of wine per week    Comment: occ  . Drug use: Yes    Types: Marijuana    Comment: rarely     Allergies   Statins and Sulfa antibiotics   Review of Systems Review of Systems  All other systems reviewed and are negative.    Physical Exam Updated  Vital Signs BP (!) 143/72 (BP Location: Right Arm)   Pulse 60   Temp 98.2 F (36.8 C) (Oral)   Resp 16  Ht 5\' 4"  (1.626 m)   Wt 72.6 kg   LMP 07/08/1979 (Approximate)   SpO2 100%   BMI 27.46 kg/m   Physical Exam Vitals signs and nursing note reviewed.  Constitutional:      Appearance: She is well-developed.  HENT:     Head: Normocephalic.     Nose: Nose normal.     Mouth/Throat:     Mouth: Mucous membranes are moist.  Eyes:     Pupils: Pupils are equal, round, and reactive to light.  Neck:     Musculoskeletal: Normal range of motion.  Cardiovascular:     Rate and Rhythm: Normal rate.  Pulmonary:     Effort: Pulmonary effort is normal.  Abdominal:     General: Abdomen is flat. There is no distension.  Musculoskeletal: Normal range of motion.  Skin:    General: Skin is warm.  Neurological:     General: No focal deficit present.     Mental Status: She is alert and oriented to person, place, and time.  Psychiatric:        Mood and Affect: Mood normal.      ED Treatments / Results  Labs (all labs ordered are listed, but only abnormal results are displayed) Labs Reviewed  PROTIME-INR - Abnormal; Notable for the following components:      Result Value   Prothrombin Time 30.2 (*)    INR 2.9 (*)    All other components within normal limits    EKG None  Radiology US Venous Img Lower Unilateral Left  Result Date: 01/30/2019 CLINICAL DATA:  Pain and edema post trauma in January EXAM: LEFT LOWER EXTREMITY VENOUS DOPPLER ULTRASOUND TECHNIQUE: Gray-scale sonography with compression, as well as color and duplex ultrasound, were performed to evaluate the deep venous system from the level of the common femoral vein through the popliteal and proximal calf veins. COMPARISON:  None FINDINGS: Normal compressibility of the common femoral, superficial femoral, and popliteal veins, as well as the proximal calf veins. No filling defects to suggest DVT on grayscale or color Doppler  imaging. Doppler waveforms show normal direction of venous flow, normal respiratory phasicity and response to augmentation. Visualized segments of the saphenous venous system normal in caliber and compressibility. Survey views of the contralateral common femoral vein are unremarkable. IMPRESSION: No femoropopliteal and no calf DVT in the visualized calf veins. If clinical symptoms are inconsistent or if there are persistent or worsening symptoms, further imaging (possibly involving the iliac veins) may be warranted. Electronically Signed   By: Lucrezia Europe M.D.   On: 01/30/2019 14:08    Procedures Procedures (including critical care time)  Medications Ordered in ED Medications - No data to display   Initial Impression / Assessment and Plan / ED Course  I have reviewed the triage vital signs and the nursing notes.  Pertinent labs & imaging results that were available during my care of the patient were reviewed by me and considered in my medical decision making (see chart for details).        MDM INR 2.9   Ultrasound no dvt.    Final Clinical Impressions(s) / ED Diagnoses   Final diagnoses:  Pain of left calf    ED Discharge Orders    None    An After Visit Summary was printed and given to the patient.    Fransico Meadow, PA-C 01/30/19 [place], [place], DO 02/01/19 1327

## 2019-01-30 NOTE — ED Triage Notes (Addendum)
Pt reports continued LT knee pain and swelling since January. Pt stated that she injured it in January and had a cortisone injection. States that she has had pain intermittently since then. Reports pain has moved to back of knee. Pt sent over by Dr. Domenic Polite to r/o DVT.

## 2019-01-30 NOTE — Discharge Instructions (Signed)
Return if any problems.  See your Physician for recheck  °

## 2019-01-31 DIAGNOSIS — H353132 Nonexudative age-related macular degeneration, bilateral, intermediate dry stage: Secondary | ICD-10-CM | POA: Diagnosis not present

## 2019-02-05 ENCOUNTER — Encounter (INDEPENDENT_AMBULATORY_CARE_PROVIDER_SITE_OTHER): Payer: Self-pay | Admitting: Internal Medicine

## 2019-02-09 ENCOUNTER — Telehealth: Payer: Self-pay | Admitting: Neurology

## 2019-02-09 MED ORDER — OXYCODONE-ACETAMINOPHEN 5-325 MG PO TABS: 1.0000 | ORAL_TABLET | Freq: Every day | ORAL | 0 refills | Status: DC | PRN

## 2019-02-09 NOTE — Addendum Note (Signed)
Addended by: Britt Bottom on: 02/09/2019 02:27 PM   Modules accepted: Orders

## 2019-02-09 NOTE — Addendum Note (Signed)
Addended by: Hope Pigeon on: 02/09/2019 12:46 PM   Modules accepted: Orders

## 2019-02-09 NOTE — Telephone Encounter (Signed)
Pt states she has been in constant pain re: her muscle spasm.  Pt would like to know if she can get the prescription for oxyCODONE-acetaminophen (ROXICET) 5-325 MG tablet called into [facility] -

## 2019-02-11 ENCOUNTER — Ambulatory Visit (INDEPENDENT_AMBULATORY_CARE_PROVIDER_SITE_OTHER): Payer: Medicare Other | Admitting: Internal Medicine

## 2019-02-19 ENCOUNTER — Ambulatory Visit: Payer: Medicare Other | Admitting: Cardiology

## 2019-02-19 ENCOUNTER — Other Ambulatory Visit: Payer: Self-pay | Admitting: Cardiology

## 2019-02-19 ENCOUNTER — Other Ambulatory Visit: Payer: Self-pay | Admitting: Cardiovascular Disease

## 2019-02-26 ENCOUNTER — Telehealth: Payer: Self-pay | Admitting: *Deleted

## 2019-02-26 NOTE — Telephone Encounter (Signed)
Called, LVM for pt to call office back about her botox appt on 03/03/19 with Dr. Felecia Shelling.  Wanted to make sure she was not showing any signs/sx of covid-19, been exposed to anyone dx with covid-19 or traveled recently.  If she HAS NOT, then ok to come to appt still

## 2019-02-26 NOTE — Telephone Encounter (Signed)
Pt has called and states they(she and husband) have been self quarintined since March and if and when they do go out they wear mask.  Pt has no signs and has not been diagnosed with Covid-19.  Pt will come into appointment by herself

## 2019-02-26 NOTE — Telephone Encounter (Signed)
Noted, thank you

## 2019-03-02 DIAGNOSIS — H353132 Nonexudative age-related macular degeneration, bilateral, intermediate dry stage: Secondary | ICD-10-CM | POA: Diagnosis not present

## 2019-03-03 ENCOUNTER — Telehealth: Payer: Self-pay | Admitting: Neurology

## 2019-03-03 ENCOUNTER — Other Ambulatory Visit: Payer: Self-pay

## 2019-03-03 ENCOUNTER — Ambulatory Visit (INDEPENDENT_AMBULATORY_CARE_PROVIDER_SITE_OTHER): Payer: Medicare Other | Admitting: Neurology

## 2019-03-03 VITALS — BP 121/61 | HR 59 | Ht 64.0 in

## 2019-03-03 DIAGNOSIS — G8929 Other chronic pain: Secondary | ICD-10-CM | POA: Diagnosis not present

## 2019-03-03 DIAGNOSIS — M7918 Myalgia, other site: Secondary | ICD-10-CM

## 2019-03-03 DIAGNOSIS — M62838 Other muscle spasm: Secondary | ICD-10-CM | POA: Diagnosis not present

## 2019-03-03 DIAGNOSIS — M546 Pain in thoracic spine: Secondary | ICD-10-CM

## 2019-03-03 MED ORDER — OXYCODONE-ACETAMINOPHEN 5-325 MG PO TABS: 1.0000 | ORAL_TABLET | Freq: Every day | ORAL | 0 refills | Status: DC | PRN

## 2019-03-03 NOTE — Telephone Encounter (Signed)
Patient needs 3 month Botox inj. Appt.

## 2019-03-03 NOTE — Progress Notes (Signed)
[facility]  PATIENT: Heather [name] DOB: 11-Apr-1946  REFERRING DOCTOR OR PCP:  Blanchie Serve SOURCE:   _________________________________   HISTORICAL  CHIEF COMPLAINT:  Chief Complaint  Patient presents with   Botulinum Toxin Injection    RM 12, alone. B/B 100U x1 vial. Lot: [id number]. Expiration: 07/2021. NDC: 7062-3762-83    HISTORY OF PRESENT ILLNESS:  Heather [name] is a 73 y.o. woman who has had chronic right-sided mid back pain since 2013.      Update 03/03/2019: Heather [name] has had pain in the right flank x several years.  Wprse pain is the right T6-T9 intracostal region.   Heather [name] has done well with Botox and reports that pain has been reduced by about 75%.    Heather [name] tolerates the injections well.   Pain has a cramp-like quality.   Heather [name] takes Percocet prn if pain is more severe.  Heather [name] is otherwise doing well.  Heather [name] is on warfarin for chronic A. fib.  Update 11/28/2018: Her myofascial pain in the mid to lower right thoracic region has responded very well to the Botox therapy and is more than 75% better than it was initially.  Most of the pain is in the right intercostal region from T6-T9.  Heather [name] has tolerated the Botox injections well.   When the pain is more severe, Heather [name] will take a Percocet.  The quality of the pain is cramp-like.  Update 08/27/2018: Heather [name] reports that the back pain is more than 50% better and the episodes of more significant pain have been significantly reduced.  Heather [name] tolerated the injections without any problems.  Most of her pain is in the right intracoastal region from T6-T9.     When pain is more severe, Heather [name] will take a Percocet.  Heather [name] does not take more than 2 or 3 a week  Heather [name] is on Warfarin for chronic Afib.     Update 05/22/2018: Heather [name] is here today for her first Botox injections for myofascial pain in the right intracoastal region (T7-T9)  Update 02/25/2018: Her right sided thoracic pain flared up a week ago and was intense x 2-3 days.   Opioids make her  feel sick. Diazepam 10 mg, Ibu and MJ helped her pain.    Currently pain is much better (improved over last 24 hours).   Heather [name] also used the [employer] patch.     The first TPI we did helped her pain but the more recent one made pain worse.     Heather [name] is wondering about Botox.    We discussed it in more detail.Heather [name]  Is on coumadin for Afib.     From 05/01/2017: Her pain is about 1 inch right of midline in the lower thoracic paraspinal region. 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 Pain worsens often with household chores or gardening. The pain may improve with using a TENS unit, a trigger point or Percocet. Heating pad also sometimes helps. When pain is more severe Heather [name] has smoked marijuana with some benefit. Cannabinol oil did not help. Flexeril did not help. More recently Heather [name] took a tizanidine and was very sleepy though pain was better that day. More recently, Heather [name] took a tizanidine 4 mg but fell asleep 1-2 hours.  The trigger point injection at the last visit (thoracic paraspinal) did not help.   Heather [name] denies any numbness or weakness in her arms or legs.   No bowel or bladder changes.    Heather [name] is on coumadin  for recently diagnose atrial fibrillation.      Heather [name] was getting trigger point injections every 3-6 months for most of the past 4 years but needed more frequently last year when Heather [name] was packing up to move to this area.        I reviewed her MRI results from 10/14/2013. The MRI of the brain showed age related atrophy and minimal chronic buccal vascular ischemic change. MRI of the cervical spine showed multilevel mild degenerative changes with left paramedian disc herniation at C3-C4 and left disc protrusion at C4-C5 and C5-C6 and midline disc herniation at C6-C7. There was no report of nerve root compression. MRI of the thoracic spine showed disc desiccation but no herniation or protrusions. MRI of the lumbar spine showed disc bulges at T12-L1 and L2-L3 and disc bulge with  facet hypertrophy at L3-L4 and disc bulge with right foraminal annular tear at L4-L5.   REVIEW OF SYSTEMS: Constitutional: No fevers, chills, sweats, or change in appetite Eyes: No visual changes, double vision, eye pain Ear, nose and throat: No hearing loss, ear pain, nasal congestion, sore throat Cardiovascular: No chest pain, palpitations.   Heather [name] has atrial fibrillation and is on warfarin Respiratory: No shortness of breath at rest or with exertion.   No wheezes GastrointestinaI: No nausea, vomiting, diarrhea, abdominal pain, fecal incontinence Genitourinary: No dysuria, urinary retention or frequency.  No nocturia. Musculoskeletal: No neck pain.  Heather [name] has back pain/thoracic pain as above Integumentary: No rash, pruritus, skin lesions Neurological: as above Psychiatric: No depression at this time.  No anxiety Endocrine: No palpitations, diaphoresis, change in appetite, change in weigh or increased thirst Hematologic/Lymphatic: No anemia, purpura, petechiae. Allergic/Immunologic: No itchy/runny eyes, nasal congestion, recent allergic reactions, rashes  ALLERGIES: Allergies  Allergen Reactions   Statins Other (See Comments)    Intolerant all statins   Sulfa Antibiotics Rash    HOME MEDICATIONS:  Current Outpatient Medications:    Biotin 10000 MCG TABS, Take 10,000 mcg by mouth daily. , Disp: , Rfl:    buPROPion (WELLBUTRIN SR) 150 MG 12 hr tablet, Take 1 tablet (150 mg total) by mouth 2 (two) times daily. (Patient taking differently: Take 150 mg by mouth every other day. ), Disp: 180 tablet, Rfl: 1   Cholecalciferol (VITAMIN D3) 125 MCG (5000 UT) TABS, Take 5,000 Units by mouth daily., Disp: , Rfl:    Coenzyme Q10 100 MG capsule, Take 100 mg by mouth daily. , Disp: , Rfl:    Cyanocobalamin (VITAMIN B-12) 2500 MCG SUBL, Place 2,500 mcg under the tongue daily., Disp: , Rfl:    docusate sodium (COLACE) 100 MG capsule, Take 100 mg by mouth daily., Disp: , Rfl:    estradiol  (ESTRACE) 1 MG tablet, Take 1 mg by mouth daily. , Disp: , Rfl:    estradiol (ESTRACE) 2 MG tablet, Take 2 mg by mouth daily., Disp: , Rfl:    Evolocumab (REPATHA) 140 MG/ML SOSY, Inject 140 mg into the skin every 14 (fourteen) days. (Patient not taking: Reported on 12/03/2018), Disp: 2 Syringe, Rfl: 11   NP THYROID 120 MG tablet, Take 120 mg by mouth daily., Disp: , Rfl:    oxyCODONE-acetaminophen (ROXICET) 5-325 MG tablet, Take 1 tablet by mouth daily as needed for severe pain., Disp: 30 tablet, Rfl: 0   Probiotic Product (PROBIOTIC DAILY PO), Take 1 capsule by mouth daily., Disp: , Rfl:    progesterone (PROMETRIUM) 200 MG capsule, Take 400 mg by mouth daily. , Disp: , Rfl:  Testosterone 20 % CREA, Apply 1 application topically daily. , Disp: , Rfl:    warfarin (COUMADIN) 5 MG tablet, Take 2 tablets by mouth every day  or as directed, Disp: 180 tablet, Rfl: 0  PAST MEDICAL HISTORY: Past Medical History:  Diagnosis Date   Adrenal adenoma    Allergy    Anemia    Anxiety    CAD (coronary artery disease)    50-60% mid LAD at cardiac catheterization 2014 ([place])   Cataract    Depression    DJD (degenerative joint disease) of cervical spine 09/12/2016   History of cardiomyopathy    History of tobacco use    Hyperlipidemia    Neuromuscular disorder (HCC)    PAF (paroxysmal atrial fibrillation) ([place])    Diagnosis July 2017 - spontaneously resolved   Statin intolerance     PAST SURGICAL HISTORY: Past Surgical History:  Procedure Laterality Date   ABDOMINAL HYSTERECTOMY  1980   endometriosis   LEFT HEART CATH AND CORONARY ANGIOGRAPHY N/A 09/25/2018   Procedure: LEFT HEART CATH AND CORONARY ANGIOGRAPHY;  Surgeon: Belva Crome, MD;  Location: [facility];  Service: Cardiovascular;  Laterality: N/A;   TONSILLECTOMY  1954    FAMILY HISTORY: Family History  Problem Relation Age of Onset   COPD Mother    Hearing loss Mother    Stroke Mother     Heart disease Mother        chf, died at 96   Arthritis Father    Hypertension Father    Heart disease Father        cad, pvd, died at 31   Heart disease Sister        heart valve   Arthritis Sister    Hyperlipidemia Brother    Hypertension Brother    Cancer - Prostate Brother     SOCIAL HISTORY:  Social History   Socioeconomic History   Marital status: Married    Spouse name: Al   Number of children: 0   Years of education: 18   Highest education level: Not on file  Occupational History   Occupation: Probation officer    Comment: from home  [place] resource strain: Not on file   Food insecurity:    Worry: Not on file    Inability: Not on file   Transportation needs:    Medical: Not on file    Non-medical: Not on file  Tobacco Use   Smoking status: Former Smoker    Packs/day: 0.00    Types: Cigarettes    Start date: 11/12/1965    Last attempt to quit: 09/06/2008    Years since quitting: 10.4   Smokeless tobacco: Never Used  Substance and Sexual Activity   Alcohol use: Yes    Alcohol/week: 1.0 standard drinks    Types: 1 Glasses of wine per week    Comment: occ   Drug use: Yes    Types: Marijuana    Comment: rarely   Sexual activity: Yes    Birth control/protection: Post-menopausal  Lifestyle   Physical activity:    Days per week: Not on file    Minutes per session: Not on file   Stress: Not on file  Relationships   Social connections:    Talks on phone: Not on file    Gets together: Not on file    Attends religious service: Not on file    Active member of club or organization: Not on file  Attends meetings of clubs or organizations: Not on file    Relationship status: Not on file   Intimate partner violence:    Fear of current or ex partner: Not on file    Emotionally abused: Not on file    Physically abused: Not on file    Forced sexual activity: Not on file  Other Topics Concern   Not on file  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   General: The patient is well-developed and well-nourished and in no acute distress   Musculoskeletal: There is tenderness in the mid to lower thoracic intracoastal region from T6-T9 on the right.    Neurologic Exam  Mental status: The patient is alert and oriented x 3 at the time of the examination. The patient has apparent normal recent and remote memory, with an apparently normal attention span and concentration ability.   Speech is normal.  Cranial nerves: Extraocular muscles are intact.  Facial strength is normal.   . No obvious hearing deficits are noted.  Motor:  Muscle bulk is normal.   Tone is normal. Strength is  5 / 5 in her muscles..    Gait and station: Gait and station are normal.    DIAGNOSTIC DATA (LABS, IMAGING, TESTING) - I reviewed patient records, labs, notes, testing and imaging myself where available.      ASSESSMENT AND PLAN  Chronic right-sided thoracic back pain  Muscle spasm  Myofascial pain syndrome    1.  100 units of Botox was split into tender points below the T6-T9 ribs into the intercostal space.  There were no complications and Heather [name] tolerated the injections well.    2.   Stay active and exercise as tolerated 3.    Heather [name] will return in 3-4 months or sooner if  new or worsening neurologic symptoms.   Monserratt Knezevic A. Felecia Shelling, MD, PhD 5/75/0518, 33:58 PM Certified in Neurology, Clinical Neurophysiology, Sleep Medicine, Pain Medicine and Neuroimaging  [facility] [address] [place], [place] [address] [phone]

## 2019-03-04 NOTE — Telephone Encounter (Signed)
I called and scheduled the patient. DW  °

## 2019-03-11 DIAGNOSIS — M660 Rupture of popliteal cyst: Secondary | ICD-10-CM | POA: Diagnosis not present

## 2019-03-11 DIAGNOSIS — M25569 Pain in unspecified knee: Secondary | ICD-10-CM | POA: Diagnosis not present

## 2019-04-08 ENCOUNTER — Other Ambulatory Visit (HOSPITAL_COMMUNITY): Payer: Self-pay | Admitting: Internal Medicine

## 2019-04-08 DIAGNOSIS — Z1231 Encounter for screening mammogram for malignant neoplasm of breast: Secondary | ICD-10-CM

## 2019-04-14 ENCOUNTER — Other Ambulatory Visit (INDEPENDENT_AMBULATORY_CARE_PROVIDER_SITE_OTHER): Payer: Medicare Other

## 2019-04-14 DIAGNOSIS — R6882 Decreased libido: Secondary | ICD-10-CM | POA: Diagnosis not present

## 2019-04-14 DIAGNOSIS — R5383 Other fatigue: Secondary | ICD-10-CM | POA: Diagnosis not present

## 2019-04-14 DIAGNOSIS — N951 Menopausal and female climacteric states: Secondary | ICD-10-CM | POA: Diagnosis not present

## 2019-04-14 DIAGNOSIS — E559 Vitamin D deficiency, unspecified: Secondary | ICD-10-CM | POA: Diagnosis not present

## 2019-04-15 ENCOUNTER — Telehealth (HOSPITAL_COMMUNITY): Payer: Self-pay

## 2019-04-15 NOTE — Telephone Encounter (Signed)
Called to schedule spoke with husband -patient was on another line-he will have her call us back and let us know if she wants to close the referral or to schedule eval for her knee pain today.

## 2019-04-15 NOTE — Telephone Encounter (Signed)
When scheduling pt she requested that the therapist know that MRI, X-Rays and Ultrasounds are in the [facility] in My Chart. Please look at them before she arrives.

## 2019-04-15 NOTE — Telephone Encounter (Signed)
Called pt back spoke to patient and she does want to schedule. Pt has been scheduled for 04/27/2019.

## 2019-04-20 ENCOUNTER — Other Ambulatory Visit: Payer: Self-pay | Admitting: Neurology

## 2019-04-20 NOTE — Telephone Encounter (Signed)
[facility] Database Verified LR: 03/11/2019 Qty: 30 Pending appointment: 06/04/2019

## 2019-04-21 ENCOUNTER — Ambulatory Visit (INDEPENDENT_AMBULATORY_CARE_PROVIDER_SITE_OTHER): Payer: Medicare Other | Admitting: Internal Medicine

## 2019-04-21 ENCOUNTER — Encounter (INDEPENDENT_AMBULATORY_CARE_PROVIDER_SITE_OTHER): Payer: Self-pay

## 2019-04-21 DIAGNOSIS — E785 Hyperlipidemia, unspecified: Secondary | ICD-10-CM | POA: Diagnosis not present

## 2019-04-21 DIAGNOSIS — N951 Menopausal and female climacteric states: Secondary | ICD-10-CM | POA: Diagnosis not present

## 2019-04-21 DIAGNOSIS — R6882 Decreased libido: Secondary | ICD-10-CM | POA: Diagnosis not present

## 2019-04-21 DIAGNOSIS — Z1159 Encounter for screening for other viral diseases: Secondary | ICD-10-CM | POA: Diagnosis not present

## 2019-04-21 DIAGNOSIS — M25569 Pain in unspecified knee: Secondary | ICD-10-CM | POA: Diagnosis not present

## 2019-04-21 DIAGNOSIS — R5383 Other fatigue: Secondary | ICD-10-CM | POA: Diagnosis not present

## 2019-04-21 DIAGNOSIS — E559 Vitamin D deficiency, unspecified: Secondary | ICD-10-CM | POA: Diagnosis not present

## 2019-04-22 ENCOUNTER — Encounter: Payer: Self-pay | Admitting: Cardiology

## 2019-04-22 ENCOUNTER — Other Ambulatory Visit: Payer: Self-pay

## 2019-04-22 ENCOUNTER — Encounter: Payer: Self-pay | Admitting: *Deleted

## 2019-04-22 ENCOUNTER — Telehealth: Payer: Self-pay | Admitting: Internal Medicine

## 2019-04-22 ENCOUNTER — Ambulatory Visit (INDEPENDENT_AMBULATORY_CARE_PROVIDER_SITE_OTHER): Payer: Medicare Other | Admitting: Cardiology

## 2019-04-22 VITALS — BP 125/69 | HR 59 | Temp 98.0°F | Ht 64.0 in | Wt 167.8 lb

## 2019-04-22 DIAGNOSIS — I25119 Atherosclerotic heart disease of native coronary artery with unspecified angina pectoris: Secondary | ICD-10-CM

## 2019-04-22 DIAGNOSIS — I480 Paroxysmal atrial fibrillation: Secondary | ICD-10-CM

## 2019-04-22 DIAGNOSIS — E7801 Familial hypercholesterolemia: Secondary | ICD-10-CM

## 2019-04-22 LAB — POCT INR: INR: 2.8 (ref 2.0–3.0)

## 2019-04-22 NOTE — Telephone Encounter (Signed)
LVM for patient to call and schedule lipid consult with Dr. Debara Pickett.

## 2019-04-22 NOTE — Patient Instructions (Signed)
Medication Instructions:  Your physician recommends that you continue on your current medications as directed. Please refer to the Current Medication list given to you today.  If you need a refill on your cardiac medications before your next appointment, please call your pharmacy.   Lab work: NONE   If you have labs (blood work) drawn today and your tests are completely normal, you will receive your results only by: . MyChart Message (if you have MyChart) OR . A paper copy in the mail If you have any lab test that is abnormal or we need to change your treatment, we will call you to review the results.  Testing/Procedures: NONE   Follow-Up: At [facility], you and your health needs are our priority.  As part of our continuing mission to provide you with exceptional heart care, we have created designated Provider Care Teams.  These Care Teams include your primary Cardiologist (physician) and Advanced Practice Providers (APPs -  Physician Assistants and Nurse Practitioners) who all work together to provide you with the care you need, when you need it. You will need a follow up appointment in 6 months.  Please call our office 2 months in advance to schedule this appointment.  You may see Samuel McDowell, MD or one of the following Advanced Practice Providers on your designated Care Team:   Brittany Strader, PA-C ([place] Office) . Michele Lenze, PA-C ([place] Office)  Any Other Special Instructions Will Be Listed Below (If Applicable). Thank you for choosing  [facility]Care!     

## 2019-04-22 NOTE — Progress Notes (Signed)
Cardiology Office Note  Date: 04/22/2019   ID: Heather [name], DOB [birth year], MRN [id number]  PCP:  Doree Albee, MD  Cardiologist:  Rozann Lesches, MD Electrophysiologist:  None   Chief Complaint  Patient presents with  . Coronary Artery Disease  . Hyperlipidemia    History of Present Illness: Heather [name] is a 73 y.o. female last seen in December 2019.  She presents for a routine visit.  She states that she has an occasional sense of palpitations, but nothing progressive.  Also no angina symptoms.  Dyspnea on exertion has been stable.  She has been having some trouble with left knee pain after a fall earlier in the year.  She states that she was seen by an orthopedist and plans to start physical therapy soon.  This has limited her usual activities, and she has gained some weight.  As noted in the previous office visit, she chose to stop Repatha and wanted to focus more on her diet with Dr. Anastasio Champion.  She states that she just had a lipid panel checked, results pending.  In the past her LDL had been as high as the 280s with familial hyperlipidemia and well-documented multi-statin intolerance.  She has not had her INR checked on Coumadin since March, states that her husband has a home monitoring device and checked her INR about a month ago, she recalls that it was "2.7." She does not report any bleeding problems.  She agreed to having her INR checked today in the office.   Past Medical History:  Diagnosis Date  . Adrenal adenoma   . Allergy   . Anemia   . Anxiety   . CAD (coronary artery disease)    50-60% mid LAD at cardiac catheterization 2014 [facility])  . Cataract   . Depression   . DJD (degenerative joint disease) of cervical spine 09/12/2016  . History of cardiomyopathy   . History of tobacco use   . Hyperlipidemia   . Neuromuscular disorder ([place])   . PAF (paroxysmal atrial fibrillation) [facility])    Diagnosis July 2017 - spontaneously resolved  . Statin  intolerance     Past Surgical History:  Procedure Laterality Date  . ABDOMINAL HYSTERECTOMY  1980   endometriosis  . LEFT HEART CATH AND CORONARY ANGIOGRAPHY N/A 09/25/2018   Procedure: LEFT HEART CATH AND CORONARY ANGIOGRAPHY;  Surgeon: Belva Crome, MD;  Location: [place] [facility];  Service: Cardiovascular;  Laterality: N/A;  . TONSILLECTOMY  1954    Current Outpatient Medications  Medication Sig Dispense Refill  . Biotin 10000 MCG TABS Take 10,000 mcg by mouth daily.     Marland Kitchen buPROPion (WELLBUTRIN SR) 150 MG 12 hr tablet Take 1 tablet (150 mg total) by mouth 2 (two) times daily. (Patient taking differently: Take 150 mg by mouth every other day. ) 180 tablet 1  . Cholecalciferol (VITAMIN D3) 125 MCG (5000 UT) TABS Take 5,000 Units by mouth daily.    . Coenzyme Q10 100 MG capsule Take 100 mg by mouth daily.     . Cyanocobalamin (VITAMIN B-12) 2500 MCG SUBL Place 2,500 mcg under the tongue daily.    Marland Kitchen docusate sodium (COLACE) 100 MG capsule Take 100 mg by mouth daily.    Marland Kitchen estradiol (ESTRACE) 2 MG tablet Take 2 mg by mouth daily.    . Evolocumab (REPATHA) 140 MG/ML SOSY Inject 140 mg into the skin every 14 (fourteen) days. 2 Syringe 11  . NP THYROID 120 MG tablet Take 120  mg by mouth daily.    Marland Kitchen oxyCODONE-acetaminophen (PERCOCET/ROXICET) 5-325 MG tablet TAKE 1 TABLET AS NEEDED FOR SEVERE PAIN. 30 tablet 0  . Probiotic Product (PROBIOTIC DAILY PO) Take 1 capsule by mouth daily.    . progesterone (PROMETRIUM) 200 MG capsule Take 400 mg by mouth daily.     . Testosterone 20 % CREA Apply 1 application topically daily.     Marland Kitchen warfarin (COUMADIN) 5 MG tablet Take 2 tablets by mouth every day  or as directed 180 tablet 0   No current facility-administered medications for this visit.    Allergies:  Statins and Sulfa antibiotics   Social History: The patient  reports that she quit smoking about 10 years ago. Her smoking use included cigarettes. She started smoking about 53 years ago. She  smoked 0.00 packs per day. She has never used smokeless tobacco. She reports current alcohol use of about 1.0 standard drinks of alcohol per week. She reports current drug use. Drug: Marijuana.   Family History: The patient's family history includes Arthritis in her father and sister; COPD in her mother; Cancer - Prostate in her brother; Hearing loss in her mother; Heart disease in her father, mother, and sister; Hyperlipidemia in her brother; Hypertension in her brother and father; Stroke in her mother.   ROS:  Please see the history of present illness. Otherwise, complete review of systems is positive for none.  All other systems are reviewed and negative.   Physical Exam: VS:  BP 125/69   Pulse (!) 59   Temp 98 F (36.7 C)   Ht 5\' 4"  (1.626 m)   Wt 167 lb 12.8 oz (76.1 kg)   LMP 07/08/1979 (Approximate)   SpO2 98%   BMI 28.80 kg/m , BMI Body mass index is 28.8 kg/m.  Wt Readings from Last 3 Encounters:  04/22/19 167 lb 12.8 oz (76.1 kg)  01/30/19 160 lb (72.6 kg)  12/01/18 157 lb (71.2 kg)    General: Patient appears comfortable at rest. HEENT: Conjunctiva and lids normal, oropharynx clear. Neck: Supple, no elevated JVP or carotid bruits, no thyromegaly. Lungs: Clear to auscultation, nonlabored breathing at rest. Cardiac: Regular rate and rhythm, no S3 or significant systolic murmur. Abdomen: Soft, nontender, bowel sounds present. Extremities: No pitting edema, distal pulses 2+. Skin: Warm and dry. Musculoskeletal: No kyphosis. Neuropsychiatric: Alert and oriented x3, affect grossly appropriate.  ECG:  An ECG dated 09/25/2018 was personally reviewed today and demonstrated:  Sinus bradycardia with decreased R wave progression.  Recent Labwork: 09/18/2018: BUN 16; Creatinine, Ser 0.73; Hemoglobin 12.2; Platelets 223; Potassium 4.5; Sodium 131    Other Studies Reviewed Today:  Cardiac catheterization 09/25/2018:  Right dominant coronary anatomy  Short left main, widely  patent  Ostial 25% LAD followed by a segmental 30 to 40% region of narrowing.  One large diagonal branch arises from this region and is as large as the left anterior descending which has no significant focal obstruction.  Circumflex has proximal irregularity up to 30%.  2 large obtuse marginal branches are free of obstruction.  The right coronary artery contains a relatively short segmental 40 to 50% narrowing.  Left ventricular function is normal with EF greater than 55%.  LVEDP is normal.  RECOMMENDATIONS:   Aggressive risk prevention: LDL less than 70, blood pressure 130/80 mmHg or less, hemoglobin A1c less than 7, 150 minutes or more of moderate physical activity per week, consider screening for sleep apnea, and smoking cessation if appropriate.  Consider aspirin although  will be at increased bleeding risk when used with Coumadin therapy.  Assessment and Plan:  1.  Mild to moderate, nonobstructive coronary artery disease by cardiac catheterization in November 2019.  Not entirely clear this explains the patient's dyspnea on exertion, LVEDP was normal as well and her LVEF is also normal.  Continuing with strategy of risk factor modification.  She is not on aspirin given concurrent use of Coumadin.  She has been on Repatha previously.  2.  Paroxysmal atrial fibrillation.  She continues on Coumadin for stroke prophylaxis.  She has not had a recent INR through the anticoagulation clinic, this will be obtained today.  She does not report any bleeding problems.  3.  Familial hyperlipidemia with LDL as high as 280s in the past and known multi statin intolerance.  She had previously been on Repatha, but chose to stop this and focus on diet as well as hormone therapy with Dr. Anastasio Champion.  She is now inclined to discuss either resuming Repatha or discussing other options.  She would like to have a consultation with our lipid clinic to discuss her options, including cost to her without a prescription  drug plan, and also potential side effects.  This will be arranged.  We will request her most recent lipid panel from Dr. Anastasio Champion.  Medication Adjustments/Labs and Tests Ordered: Current medicines are reviewed at length with the patient today.  Concerns regarding medicines are outlined above.   Tests Ordered: Orders Placed This Encounter  Procedures  . AMB Referral to Advanced Lipid Disorders Clinic    Medication Changes: No orders of the defined types were placed in this encounter.   Disposition:  Follow up 6 months in the [place] office.  Signed, Satira Sark, MD, [facility] 04/22/2019 2:16 PM    [facility] at [facility] [address] [place], [place] [address] Phone: [phone]; Fax: [phone]

## 2019-04-23 ENCOUNTER — Ambulatory Visit (INDEPENDENT_AMBULATORY_CARE_PROVIDER_SITE_OTHER): Payer: Medicare Other | Admitting: *Deleted

## 2019-04-23 DIAGNOSIS — Z5181 Encounter for therapeutic drug level monitoring: Secondary | ICD-10-CM | POA: Diagnosis not present

## 2019-04-23 DIAGNOSIS — I480 Paroxysmal atrial fibrillation: Secondary | ICD-10-CM

## 2019-04-23 NOTE — Patient Instructions (Signed)
Continue coumadin 2 tablets daily except 1 tablet on Mondays and Fridays.   Recheck in 6 weeks Continue greens

## 2019-04-27 ENCOUNTER — Ambulatory Visit (HOSPITAL_COMMUNITY): Payer: Medicare Other | Attending: Internal Medicine

## 2019-04-27 ENCOUNTER — Telehealth: Payer: Self-pay | Admitting: Neurology

## 2019-04-27 ENCOUNTER — Encounter (HOSPITAL_COMMUNITY): Payer: Self-pay

## 2019-04-27 ENCOUNTER — Other Ambulatory Visit: Payer: Self-pay

## 2019-04-27 DIAGNOSIS — M6281 Muscle weakness (generalized): Secondary | ICD-10-CM | POA: Diagnosis not present

## 2019-04-27 DIAGNOSIS — R262 Difficulty in walking, not elsewhere classified: Secondary | ICD-10-CM | POA: Diagnosis not present

## 2019-04-27 DIAGNOSIS — G8929 Other chronic pain: Secondary | ICD-10-CM | POA: Diagnosis not present

## 2019-04-27 DIAGNOSIS — R600 Localized edema: Secondary | ICD-10-CM | POA: Insufficient documentation

## 2019-04-27 DIAGNOSIS — M25562 Pain in left knee: Secondary | ICD-10-CM | POA: Insufficient documentation

## 2019-04-27 NOTE — Therapy (Signed)
[place], [name], [address] Phone: [phone]   Fax:  [phone]  Physical Therapy Evaluation  Patient Details  Name: Heather [name] MRN: [id number] Date of Birth: 03-24-1946 Referring Provider (PT): Doree Albee, MD   Encounter Date: 04/27/2019  PT End of Session - 04/27/19 1504    Visit Number  1    Number of Visits  8    Date for PT Re-Evaluation  05/25/19    Authorization Type  Medicare (Secondary: Generic Commercial    Authorization Time Period  04/27/19 to 05/25/19    Authorization - Visit Number  1    Authorization - Number of Visits  10    PT Start Time  0370    PT Stop Time  1455    PT Time Calculation (min)  43 min    Activity Tolerance  Patient tolerated treatment well    Behavior During Therapy  [facility] for tasks assessed/performed       Past Medical History:  Diagnosis Date  . Adrenal adenoma   . Allergy   . Anemia   . Anxiety   . CAD (coronary artery disease)    50-60% mid LAD at cardiac catheterization 2014 [facility])  . Cataract   . Depression   . DJD (degenerative joint disease) of cervical spine 09/12/2016  . History of cardiomyopathy   . History of tobacco use   . Hyperlipidemia   . Neuromuscular disorder ([place])   . PAF (paroxysmal atrial fibrillation) [facility])    Diagnosis July 2017 - spontaneously resolved  . Statin intolerance     Past Surgical History:  Procedure Laterality Date  . ABDOMINAL HYSTERECTOMY  1980   endometriosis  . LEFT HEART CATH AND CORONARY ANGIOGRAPHY N/A 09/25/2018   Procedure: LEFT HEART CATH AND CORONARY ANGIOGRAPHY;  Surgeon: Belva Crome, MD;  Location: [facility];  Service: Cardiovascular;  Laterality: N/A;  . TONSILLECTOMY  1954    There were no vitals filed for this visit.   Subjective Assessment - 04/27/19 1414    Subjective  Pt states that in January 2020 she had started a walking routine with her husband, which was up and down  hills. It got to the point that she couldn't walk anymore. She states that she now has difficulty with stairs, walking, weakness, intermittent/infrequent buckling. She has never had this knee pain before. She has just been told that it could be degenerative and that they can't find any specific tear. MDs not looking at surgery right now. Has difficulty with bending knees. She is having knee swelling; tried compression stockings but it didn't really help.    How long can you sit comfortably?  gets stiffer the longer she sits    How long can you stand comfortably?  no issues    Diagnostic tests  x-ray, MRI, [place]    Patient Stated Goals  have normal function again without pain    Currently in Pain?  No/denies         [facility] PT Assessment - 04/27/19 0001      Assessment   Medical Diagnosis  L knee pain    Referring Provider (PT)  Doree Albee, MD    Onset Date/Surgical Date  --   January 2020   Next MD Visit  August 2020    Prior Therapy  none for present issue      Balance Screen   Has the patient fallen in the past 6  months  Yes    How many times?  1    Has the patient had a decrease in activity level because of a fear of falling?   Yes    Is the patient reluctant to leave their home because of a fear of falling?   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, Therapist, music    Leisure  dancing, jigsaw puzzles, cooking, gardening      Observation/Other Assessments   Focus on Therapeutic Outcomes ([place])   to be completed next visit      Observation/Other Assessments-Edema    Edema  Circumferential      Circumferential Edema   Circumferential - Right  35.5   joint line   Circumferential - Left   38 cm   joint line     ROM / Strength   AROM / PROM / Strength  AROM;Strength      AROM   AROM Assessment Site  Knee    Right/Left Knee  Left    Left Knee Extension  6   from extension   Left Knee  Flexion  117      Strength   Strength Assessment Site  Knee;Hip;Ankle    Right Hip Flexion  4+/5    Right Hip Extension  4-/5    Right Hip ABduction  4/5    Left Hip Flexion  4+/5    Left Hip Extension  3+/5    Left Hip ABduction  4-/5    Right Knee Flexion  4+/5    Right Knee Extension  5/5    Left Knee Flexion  4-/5    Left Knee Extension  4+/5    Right Ankle Dorsiflexion  4+/5    Left Ankle Dorsiflexion  4+/5      Palpation   Patella mobility  WNL    Palpation comment  increased restrictions in LLE distal quad which recreated her same pain below her knee cap in the patellar tendon; mild restrictions in HS and calf      Special Tests    Special Tests  Laxity/Instability Tests;Meniscus Tests    Laxity/Instability   Anterior drawer test;Posterior drawer test;other    Meniscus Tests  other      Anterior drawer test   Findings  Negative    Side  Left      Posterior drawer test   Findings  Negative    Side   Left      Other   Findings  Negative    side  Left    comment  varus and valgus      other   Findings  Positive    Side  Left    Comments  Thessaly's in deeper knee flexion ROM, no pain with ~15deg knee flex      Ambulation/Gait   Ambulation Distance (Feet)  578 Feet   3MWT   Assistive device  None    Gait Pattern  Step-through pattern;Decreased stride length;Decreased hip/knee flexion - left;Decreased dorsiflexion - left;Trendelenburg;Antalgic    Gait Comments  pain in quad/patellar tendon at heel strike       Balance   Balance Assessed  Yes      Static Standing Balance   Static Standing - Balance Support  No upper extremity supported    Static Standing Balance -  Activities   Single Leg Stance - Right Leg;Single  Leg Stance - Left Leg    Static Standing - Comment/# of Minutes  R: 7 sec or < L: 41 sec      Standardized Balance Assessment   Standardized Balance Assessment  Five Times Sit to Stand    Five times sit to stand comments   12.3 sec, from chair,  no UE, LLE extended, painful 5-6/10            Objective measurements completed on examination: See above findings.         PT Education - 04/27/19 1503    Education Details  exam findings, HEP, POC    Person(s) Educated  Patient    Methods  Explanation;Demonstration;Handout    Comprehension  Verbalized understanding       PT Short Term Goals - 04/27/19 1527      PT SHORT TERM GOAL #1   Title  Pt will have improved L knee AROM from 0-120deg in order to demo reduced edema and reduce her pain.    Time  2    Period  Weeks    Status  New    Target Date  05/11/19      PT SHORT TERM GOAL #2   Title  Pt will have reduced edema at joint line by 2cm in order to reduce her pain and improve ROM.    Time  2    Period  Weeks    Status  New      PT SHORT TERM GOAL #3   Title  Pt will be able to perform R SLS for 15 sec or > to demo improved functional and core strength and to maximize stair ambulation.    Time  2    Period  Weeks    Status  New        PT Long Term Goals - 04/27/19 1528      PT LONG TERM GOAL #1   Title  Pt will have improved MMT by 1/2 grade throughout all mm groups in order to reduce pain and maximize gait.    Time  4    Period  Weeks    Status  New    Target Date  05/25/19      PT LONG TERM GOAL #2   Title  Pt will have 141ft improvement in 3MWT with 3/10 L knee pain or < with gait mechanics WFL in order to demo improved overall function and maximize return to her regular walking routine.    Time  4    Period  Weeks    Status  New      PT LONG TERM GOAL #3   Title  Pt will report being able to ambulate full flight of stairs reciprocally with 3/10 L knee pain or < to demo improved ROM and functional strength.    Time  4    Period  Weeks    Status  New      PT LONG TERM GOAL #4   Title  Pt will be able to perform 5xSTS in 10sec or < with proper form and with 3/10 L knee pain or < to demo improved balance and functional strength.    Time  4     Period  Weeks    Status  New             Plan - 04/27/19 1523    Clinical Impression Statement  Pt is 73YO F who presents to OPPT with c/o L knee pain since  January 2020 after she began a walking program with her husband. Pt presents with mild deficits in AROM, deficits in MMT, edema, functional strength, stairs, and gait. Pt also had mild restrictions in L distal quad and reported that palpation to it recreated her same pain at her anterior knee, pinpointing it her patellar tendon. Pt demo'ing max compensation for stair descent due to knee pain and reported recreation of her pain at anterior knee during gait at heel strike. She was negative for ligament testing throughout the knee but was + for Thessaly's in the deeper knee flexion position, indicating potential meniscal involvement. Pt inquiring about obtaining a second orthopedic opinion during session and verbalizing uncertainty about therapeutic benefits; PT educated pt that if she felt like that she needed a second opinion, that was her decision and understandable. Pt needs skilled PT intervention to address impairments listed above and she was willing to try PT to see if it will help reduce her pain.    Personal Factors and Comorbidities  Age;Behavior Pattern;Comorbidity 3+;Past/Current Experience    Comorbidities  see above    Examination-Activity Limitations  Locomotion Level;Stairs;Transfers    Examination-Participation Restrictions  Community Activity;Yard Work    Merchant navy officer  Stable/Uncomplicated    Designer, jewellery  Low    Rehab Potential  Fair    PT Frequency  2x / week    PT Duration  4 weeks    PT Treatment/Interventions  ADLs/Self Care Home Management;Aquatic Therapy;Cryotherapy;Electrical Stimulation;Moist Heat;Traction;Ultrasound;DME Instruction;Gait training;Stair training;Functional mobility training;Therapeutic activities;Therapeutic exercise;Balance training;Neuromuscular  re-education;Patient/family education;Orthotic Fit/Training;Manual techniques;Passive range of motion;Dry needling;Taping    PT Next Visit Plan  review goals and HEP; L knee AROM work, BLE and functional strengthening within tolerance, prone quad stretch, manual STM for quad restrictions and patellar tendon tenderness, edema management    PT Home Exercise Plan  eval: quad set, heel slide, bridging    Consulted and Agree with Plan of Care  Patient       Patient will benefit from skilled therapeutic intervention in order to improve the following deficits and impairments:  Decreased activity tolerance, Decreased balance, Decreased range of motion, Decreased strength, Difficulty walking, Hypomobility, Increased edema, Increased fascial restricitons, Increased muscle spasms, Impaired flexibility, Pain  Visit Diagnosis: Chronic pain of left knee - Plan: PT plan of care cert/re-cert  Localized edema - Plan: PT plan of care cert/re-cert  Muscle weakness (generalized) - Plan: PT plan of care cert/re-cert  Difficulty in walking, not elsewhere classified - Plan: PT plan of care cert/re-cert     Problem List Patient Active Problem List   Diagnosis Date Noted  . Abnormal myocardial perfusion study   . Muscle spasm 02/25/2018  . Tubular adenoma of colon 07/19/2017  . H. pylori infection 07/19/2017  . Thoracic back pain 02/08/2017  . Hx of colonic polyps 12/03/2016  . Family history of colon cancer 12/03/2016  . Encounter for therapeutic drug monitoring 11/21/2016  . CAD (coronary artery disease) 09/12/2016  . PAF (paroxysmal atrial fibrillation) ([place]) 09/12/2016  . Syncope 09/12/2016  . DJD (degenerative joint disease) of cervical spine 09/12/2016  . Statin intolerance 09/12/2016  . TIA (transient ischemic attack) 09/12/2016  . Diverticulitis of colon 09/12/2016  . Myofascial pain syndrome 09/06/2016  . Takotsubo syndrome 09/06/2016  . HLD (hyperlipidemia) 09/06/2016  . Depression with  anxiety 09/06/2016  . Recurrent UTI (urinary tract infection) 09/06/2016        Geraldine Solar PT, DPT   [facility] [address] [place], [name], [address] Phone: [phone]   Fax:  [phone]  Name: Heather [name] MRN: [id number] Date of Birth: 1946/02/27

## 2019-04-27 NOTE — Telephone Encounter (Signed)
Dr. Sater- what would you recommend? 

## 2019-04-27 NOTE — Telephone Encounter (Signed)
Patient called and stated that she feels she needs to get her Botox injections before the 3 month period is up. I explained to her that she has to wait 3 months per insurance guidelines. She would like to speak with the nurse to know if there are any options until she can get her Botox and she has some questions regarding how often she can take the percocet. Please call and advise.

## 2019-04-27 NOTE — Telephone Encounter (Signed)
It will be ok to take 2 pills of percocet on a bad day.

## 2019-04-27 NOTE — Telephone Encounter (Signed)
Called, LVM relaying Dr. Garth Bigness recommendation. Asked her to call back if she is okay with this and we can call in prescription for her. Gave [facility] phone number

## 2019-04-27 NOTE — Telephone Encounter (Addendum)
Took call from [place]. Pt called back. States flexeril has been ineffective in the past. She has also tried tizanidine in the past but this was too strong. Only thing that helps is the percocet. Wondering if she can she take two tablets on a bad day.  She had old prescription of flexeril from Dr. Felecia Shelling that is a couple years old Advised I will discuss with Dr. Felecia Shelling and call back with recommendation.Marland Kitchen

## 2019-04-27 NOTE — Telephone Encounter (Signed)
We can send in flexeril 5 mg # 90 up to 3 a day with 2 refills

## 2019-04-28 NOTE — Telephone Encounter (Signed)
Called, LVM for pt relaying per Dr. Felecia Shelling that she can take two tablets of percocet on bad days. Asked her to call back if she has further questions/concerns.

## 2019-04-30 DIAGNOSIS — H353132 Nonexudative age-related macular degeneration, bilateral, intermediate dry stage: Secondary | ICD-10-CM | POA: Diagnosis not present

## 2019-05-04 ENCOUNTER — Ambulatory Visit (HOSPITAL_COMMUNITY): Payer: Medicare Other | Admitting: Physical Therapy

## 2019-05-04 ENCOUNTER — Telehealth (HOSPITAL_COMMUNITY): Payer: Self-pay | Admitting: Physical Therapy

## 2019-05-04 NOTE — Telephone Encounter (Signed)
Pt hurt her back and will not be here today-she will return on Thursday.

## 2019-05-07 ENCOUNTER — Encounter (HOSPITAL_COMMUNITY): Payer: Self-pay

## 2019-05-07 ENCOUNTER — Other Ambulatory Visit: Payer: Self-pay

## 2019-05-07 ENCOUNTER — Ambulatory Visit (HOSPITAL_COMMUNITY): Payer: Medicare Other

## 2019-05-07 DIAGNOSIS — G8929 Other chronic pain: Secondary | ICD-10-CM

## 2019-05-07 DIAGNOSIS — M25562 Pain in left knee: Secondary | ICD-10-CM | POA: Diagnosis not present

## 2019-05-07 DIAGNOSIS — M6281 Muscle weakness (generalized): Secondary | ICD-10-CM

## 2019-05-07 DIAGNOSIS — R262 Difficulty in walking, not elsewhere classified: Secondary | ICD-10-CM

## 2019-05-07 DIAGNOSIS — R600 Localized edema: Secondary | ICD-10-CM

## 2019-05-07 NOTE — Patient Instructions (Signed)
Short Arc [employer] a large can or rolled towel under leg. Straighten knee and leg. Hold 5 seconds. Repeat with other leg. Repeat 10 times. Do 2 sessions per day.  http://gt2.exer.us/366   Copyright  VHI. All rights reserved.

## 2019-05-07 NOTE — Therapy (Signed)
[name], [name], [address] Phone: [phone]   Fax:  [phone]  Physical Therapy Treatment  Patient Details  Name: Heather [name] MRN: [id number] Date of Birth: 05-25-1946 Referring Provider (PT): Doree Albee, MD   Encounter Date: 05/07/2019  PT End of Session - 05/07/19 1520    Visit Number  2    Number of Visits  8    Date for PT Re-Evaluation  05/25/19    Authorization Type  Medicare (Secondary: Generic Commercial    Authorization Time Period  04/27/19 to 05/25/19    Authorization - Visit Number  2    Authorization - Number of Visits  10    PT Start Time  1504    PT Stop Time  1546    PT Time Calculation (min)  42 min    Activity Tolerance  Patient tolerated treatment well    Behavior During Therapy  [facility] for tasks assessed/performed       Past Medical History:  Diagnosis Date  . Adrenal adenoma   . Allergy   . Anemia   . Anxiety   . CAD (coronary artery disease)    50-60% mid LAD at cardiac catheterization 2014 [facility])  . Cataract   . Depression   . DJD (degenerative joint disease) of cervical spine 09/12/2016  . History of cardiomyopathy   . History of tobacco use   . Hyperlipidemia   . Neuromuscular disorder ([place])   . PAF (paroxysmal atrial fibrillation) [facility])    Diagnosis July 2017 - spontaneously resolved  . Statin intolerance     Past Surgical History:  Procedure Laterality Date  . ABDOMINAL HYSTERECTOMY  1980   endometriosis  . LEFT HEART CATH AND CORONARY ANGIOGRAPHY N/A 09/25/2018   Procedure: LEFT HEART CATH AND CORONARY ANGIOGRAPHY;  Surgeon: Belva Crome, MD;  Location: [facility];  Service: Cardiovascular;  Laterality: N/A;  . TONSILLECTOMY  1954    There were no vitals filed for this visit.  Subjective Assessment - 05/07/19 1523    Subjective  Pt reports she feels a lot better, has began HEP daily.  Does reports increased swelling following exercises.   Has injured her back doing gardening last week.    Patient Stated Goals  have normal function again without pain    Currently in Pain?  No/denies         [facility] PT Assessment - 05/07/19 0001      Assessment   Medical Diagnosis  L knee pain    Referring Provider (PT)  Doree Albee, MD    Onset Date/Surgical Date  --   January 2020   Hand Dominance  Right    Next MD Visit  August 2020    Prior Therapy  none for present issue      Observation/Other Assessments   Focus on Therapeutic Outcomes (FOTO)   42% limited                   [facility] Adult PT Treatment/Exercise - 05/07/19 0001      Exercises   Exercises  Knee/Hip      Knee/Hip Exercises: Stretches   Quad Stretch  2 reps;30 seconds;Left   Prone quad stretch     Knee/Hip Exercises: Supine   Quad Sets  10 reps;Limitations   5" holds   Short Arc Quad Sets  10 reps;Left    Short Arc Quad Sets Limitations  5" holds  Heel Slides  10 reps    Bridges  10 reps      Manual Therapy   Manual Therapy  Edema management;Myofascial release;Joint mobilization    Manual therapy comments  completed seperate of other services    Edema Management  Retrograde massage with LE elevated    Joint Mobilization  patella and tib/fib grade II    Myofascial Release  quad restrictions and patellar tendon              PT Education - 05/07/19 1551    Education Details  Reviewed goals, assured complaince with HEP, pt able to demonstrate and verbalize without any cueing required, FOTO complete, discussed edema control stragies    Person(s) Educated  Patient    Methods  Explanation;Demonstration    Comprehension  Verbalized understanding       PT Short Term Goals - 04/27/19 1527      PT SHORT TERM GOAL #1   Title  Pt will have improved L knee AROM from 0-120deg in order to demo reduced edema and reduce her pain.    Time  2    Period  Weeks    Status  New    Target Date  05/11/19      PT SHORT TERM GOAL #2   Title  Pt  will have reduced edema at joint line by 2cm in order to reduce her pain and improve ROM.    Time  2    Period  Weeks    Status  New      PT SHORT TERM GOAL #3   Title  Pt will be able to perform R SLS for 15 sec or > to demo improved functional and core strength and to maximize stair ambulation.    Time  2    Period  Weeks    Status  New        PT Long Term Goals - 04/27/19 1528      PT LONG TERM GOAL #1   Title  Pt will have improved MMT by 1/2 grade throughout all mm groups in order to reduce pain and maximize gait.    Time  4    Period  Weeks    Status  New    Target Date  05/25/19      PT LONG TERM GOAL #2   Title  Pt will have 171ft improvement in 3MWT with 3/10 L knee pain or < with gait mechanics WFL in order to demo improved overall function and maximize return to her regular walking routine.    Time  4    Period  Weeks    Status  New      PT LONG TERM GOAL #3   Title  Pt will report being able to ambulate full flight of stairs reciprocally with 3/10 L knee pain or < to demo improved ROM and functional strength.    Time  4    Period  Weeks    Status  New      PT LONG TERM GOAL #4   Title  Pt will be able to perform 5xSTS in 10sec or < with proper form and with 3/10 L knee pain or < to demo improved balance and functional strength.    Time  4    Period  Weeks    Status  New            Plan - 05/07/19 1554    Clinical Impression Statement  Reviewed goals  and assured compliance with HEP, pt able to verbalize and demonstrate appropriate mechanics with all exercises.  FOTO complete with 42% limitations.  Session focus on knee mobility, quad strengthening and manual technqiues to assist with edema control.  Pt making great gains wiht AROM at 5-124 degrees today and able to complete all exercises with no reports of pain.  Added SAQ for knee extension strengthening to HEP, printout given.  EOS with manual retrograde massage, MFR to distal quad restricitons and  patella for edema and mobility.    Personal Factors and Comorbidities  Age;Behavior Pattern;Comorbidity 3+;Past/Current Experience    Comorbidities  see above    Examination-Activity Limitations  Locomotion Level;Stairs;Transfers    Examination-Participation Restrictions  Community Activity;Yard Work    Merchant navy officer  Stable/Uncomplicated    Designer, jewellery  Low    Rehab Potential  Fair    PT Frequency  2x / week    PT Duration  4 weeks    PT Treatment/Interventions  ADLs/Self Care Home Management;Aquatic Therapy;Cryotherapy;Electrical Stimulation;Moist Heat;Traction;Ultrasound;DME Instruction;Gait training;Stair training;Functional mobility training;Therapeutic activities;Therapeutic exercise;Balance training;Neuromuscular re-education;Patient/family education;Orthotic Fit/Training;Manual techniques;Passive range of motion;Dry needling;Taping    PT Next Visit Plan  Begin TKE.  Continue extension L knee AROM work, BLE and functional strengthening within tolerance, prone quad stretch, manual STM for quad restrictions and patellar tendon tenderness, edema management    PT Home Exercise Plan  eval: quad set, heel slide, bridging; 6/25: SAQ    Consulted and Agree with Plan of Care  Patient       Patient will benefit from skilled therapeutic intervention in order to improve the following deficits and impairments:  Decreased activity tolerance, Decreased balance, Decreased range of motion, Decreased strength, Difficulty walking, Hypomobility, Increased edema, Increased fascial restricitons, Increased muscle spasms, Impaired flexibility, Pain  Visit Diagnosis: 1. Chronic pain of left knee   2. Localized edema   3. Muscle weakness (generalized)   4. Difficulty in walking, not elsewhere classified        Problem List Patient Active Problem List   Diagnosis Date Noted  . Abnormal myocardial perfusion study   . Muscle spasm 02/25/2018  . Tubular adenoma of colon  07/19/2017  . H. pylori infection 07/19/2017  . Thoracic back pain 02/08/2017  . Hx of colonic polyps 12/03/2016  . Family history of colon cancer 12/03/2016  . Encounter for therapeutic drug monitoring 11/21/2016  . CAD (coronary artery disease) 09/12/2016  . PAF (paroxysmal atrial fibrillation) ([place]) 09/12/2016  . Syncope 09/12/2016  . DJD (degenerative joint disease) of cervical spine 09/12/2016  . Statin intolerance 09/12/2016  . TIA (transient ischemic attack) 09/12/2016  . Diverticulitis of colon 09/12/2016  . Myofascial pain syndrome 09/06/2016  . Takotsubo syndrome 09/06/2016  . HLD (hyperlipidemia) 09/06/2016  . Depression with anxiety 09/06/2016  . Recurrent UTI (urinary tract infection) 09/06/2016   Ihor Austin, [place]; [place]  Aldona Lento 05/07/2019, 5:13 PM  [place], [name], [address] Phone: [phone]   Fax:  [phone]  Name: Heather [name] MRN: [id number] Date of Birth: 1946/09/28

## 2019-05-11 ENCOUNTER — Ambulatory Visit (HOSPITAL_COMMUNITY): Payer: Medicare Other

## 2019-05-11 ENCOUNTER — Encounter (HOSPITAL_COMMUNITY): Payer: Self-pay

## 2019-05-11 ENCOUNTER — Other Ambulatory Visit: Payer: Self-pay

## 2019-05-11 DIAGNOSIS — M6281 Muscle weakness (generalized): Secondary | ICD-10-CM

## 2019-05-11 DIAGNOSIS — G8929 Other chronic pain: Secondary | ICD-10-CM | POA: Diagnosis not present

## 2019-05-11 DIAGNOSIS — R600 Localized edema: Secondary | ICD-10-CM | POA: Diagnosis not present

## 2019-05-11 DIAGNOSIS — M25562 Pain in left knee: Secondary | ICD-10-CM

## 2019-05-11 DIAGNOSIS — R262 Difficulty in walking, not elsewhere classified: Secondary | ICD-10-CM

## 2019-05-11 NOTE — Therapy (Signed)
[place], [name], [address] Phone: [phone]   Fax:  [phone]  Physical Therapy Treatment  Patient Details  Name: Heather [name] MRN: [id number] Date of Birth: 01/19/1946 Referring Provider (PT): Doree Albee, MD   Encounter Date: 05/11/2019  PT End of Session - 05/11/19 1655    Visit Number  3    Number of Visits  8    Date for PT Re-Evaluation  05/25/19    Authorization Type  Medicare (Secondary: Generic Commercial    Authorization Time Period  04/27/19 to 05/25/19    Authorization - Visit Number  3    Authorization - Number of Visits  10    PT Start Time  6195    PT Stop Time  1648    PT Time Calculation (min)  44 min    Activity Tolerance  Patient tolerated treatment well    Behavior During Therapy  [facility] for tasks assessed/performed       Past Medical History:  Diagnosis Date  . Adrenal adenoma   . Allergy   . Anemia   . Anxiety   . CAD (coronary artery disease)    50-60% mid LAD at cardiac catheterization 2014 [facility])  . Cataract   . Depression   . DJD (degenerative joint disease) of cervical spine 09/12/2016  . History of cardiomyopathy   . History of tobacco use   . Hyperlipidemia   . Neuromuscular disorder ([place])   . PAF (paroxysmal atrial fibrillation) [facility])    Diagnosis July 2017 - spontaneously resolved  . Statin intolerance     Past Surgical History:  Procedure Laterality Date  . ABDOMINAL HYSTERECTOMY  1980   endometriosis  . LEFT HEART CATH AND CORONARY ANGIOGRAPHY N/A 09/25/2018   Procedure: LEFT HEART CATH AND CORONARY ANGIOGRAPHY;  Surgeon: Belva Crome, MD;  Location: [place] [facility];  Service: Cardiovascular;  Laterality: N/A;  . TONSILLECTOMY  1954    There were no vitals filed for this visit.  Subjective Assessment - 05/11/19 1604    Subjective  Pt stated she is feeling great, has been carrying stuff up and down stairs with improved confidence with task.                        [facility] PT Treatment/Exercise - 05/11/19 0001      Knee/Hip Exercises: Stretches   Gastroc Stretch  2 reps;30 seconds    Gastroc Stretch Limitations  slant board      Knee/Hip Exercises: Standing   Heel Raises  15 reps    Heel Raises Limitations  Toe raises on incline slope    Terminal Knee Extension  10 reps;Theraband    Theraband Level (Terminal Knee Extension)  Level 2 (Red)    Lateral Step Up  Left;10 reps;Hand Hold: 1;Step Height: 4"    Rocker Board  1 minute    Rocker Board Limitations  lateral and DF/PF    Gait Training  heel to toe pattern x 200 ft      Knee/Hip Exercises: Supine   Short Arc [employer]  15 reps    Short Arc Quad Sets Limitations  5" holds    Terminal Knee Extension  10 reps    Terminal Knee Extension Limitations  5"holds    Bridges  15 reps    Bridges Limitations  5"      Manual Therapy   Manual Therapy  Edema management;Myofascial release;Joint  mobilization    Manual therapy comments  completed seperate of other services    Edema Management  Retrograde massage with LE elevated    Joint Mobilization  patella and tib/fib grade II    Myofascial Release  quad restrictions and patellar tendon; roller stick to ITB               PT Short Term Goals - 04/27/19 1527      PT SHORT TERM GOAL #1   Title  Pt will have improved L knee AROM from 0-120deg in order to demo reduced edema and reduce her pain.    Time  2    Period  Weeks    Status  New    Target Date  05/11/19      PT SHORT TERM GOAL #2   Title  Pt will have reduced edema at joint line by 2cm in order to reduce her pain and improve ROM.    Time  2    Period  Weeks    Status  New      PT SHORT TERM GOAL #3   Title  Pt will be able to perform R SLS for 15 sec or > to demo improved functional and core strength and to maximize stair ambulation.    Time  2    Period  Weeks    Status  New        PT Long Term Goals - 04/27/19 1528      PT  LONG TERM GOAL #1   Title  Pt will have improved MMT by 1/2 grade throughout all mm groups in order to reduce pain and maximize gait.    Time  4    Period  Weeks    Status  New    Target Date  05/25/19      PT LONG TERM GOAL #2   Title  Pt will have 110ft improvement in 3MWT with 3/10 L knee pain or < with gait mechanics WFL in order to demo improved overall function and maximize return to her regular walking routine.    Time  4    Period  Weeks    Status  New      PT LONG TERM GOAL #3   Title  Pt will report being able to ambulate full flight of stairs reciprocally with 3/10 L knee pain or < to demo improved ROM and functional strength.    Time  4    Period  Weeks    Status  New      PT LONG TERM GOAL #4   Title  Pt will be able to perform 5xSTS in 10sec or < with proper form and with 3/10 L knee pain or < to demo improved balance and functional strength.    Time  4    Period  Weeks    Status  New            Plan - 05/11/19 1655    Clinical Impression Statement  Added TKE and lateral step ups for quad strengthening.  Cueing for heel strike to improve extension with gait.  EOS with manual techniques to address proximal knee edema and distal quad restricitons iwht noted tenderness over ITB as well.  Improved to 3 degrees extension lag.  No reports of pain through session.    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  Next session begin wall squats, step ups, possibly SLS/vector stance and progress functional strengthening for quad and gluts.  manual STM for quad restrictions and patellar tendon tenderness, edema management    PT Home Exercise Plan  eval: quad set, heel slide, bridging; 6/25: SAQ       Patient will benefit from skilled therapeutic intervention in order to improve the following deficits and impairments:  Decreased activity tolerance, Decreased balance, Decreased range of motion, Decreased strength, Difficulty walking, Hypomobility, Increased edema, Increased fascial restricitons, Increased muscle spasms, Impaired flexibility, Pain  Visit Diagnosis: 1. Localized edema   2. Muscle weakness (generalized)   3. Difficulty in walking, not elsewhere classified   4. Chronic pain of left knee        Problem List Patient Active Problem List   Diagnosis Date Noted  . Abnormal myocardial perfusion study   . Muscle spasm 02/25/2018  . Tubular adenoma of colon 07/19/2017  . H. pylori infection 07/19/2017  . Thoracic back pain 02/08/2017  . Hx of colonic polyps 12/03/2016  . Family history of colon cancer 12/03/2016  . Encounter for therapeutic drug monitoring 11/21/2016  . CAD (coronary artery disease) 09/12/2016  . PAF (paroxysmal atrial fibrillation) ([place]) 09/12/2016  . Syncope 09/12/2016  . DJD (degenerative joint disease) of cervical spine 09/12/2016  . Statin intolerance 09/12/2016  . TIA (transient ischemic attack) 09/12/2016  . Diverticulitis of colon 09/12/2016  . Myofascial pain syndrome 09/06/2016  . Takotsubo syndrome 09/06/2016  . HLD (hyperlipidemia) 09/06/2016  . Depression  with anxiety 09/06/2016  . Recurrent UTI (urinary tract infection) 09/06/2016   Ihor Austin, [place]; [place]  Aldona Lento 05/11/2019, 5:04 PM  [place], [name], [address] Phone: [phone]   Fax:  [phone]  Name: Heather [name] MRN: [id number] Date of Birth: September 08, 1946

## 2019-05-13 ENCOUNTER — Encounter (HOSPITAL_COMMUNITY): Payer: Medicare Other

## 2019-05-18 ENCOUNTER — Ambulatory Visit (HOSPITAL_COMMUNITY): Payer: Medicare Other | Attending: Internal Medicine

## 2019-05-18 ENCOUNTER — Other Ambulatory Visit: Payer: Self-pay

## 2019-05-18 ENCOUNTER — Encounter (HOSPITAL_COMMUNITY): Payer: Self-pay

## 2019-05-18 DIAGNOSIS — M25562 Pain in left knee: Secondary | ICD-10-CM | POA: Insufficient documentation

## 2019-05-18 DIAGNOSIS — G8929 Other chronic pain: Secondary | ICD-10-CM | POA: Insufficient documentation

## 2019-05-18 DIAGNOSIS — M6281 Muscle weakness (generalized): Secondary | ICD-10-CM | POA: Diagnosis not present

## 2019-05-18 DIAGNOSIS — R262 Difficulty in walking, not elsewhere classified: Secondary | ICD-10-CM | POA: Diagnosis not present

## 2019-05-18 DIAGNOSIS — R600 Localized edema: Secondary | ICD-10-CM | POA: Insufficient documentation

## 2019-05-18 NOTE — Therapy (Signed)
[place], [name], [address] Phone: [phone]   Fax:  [phone]  Physical Therapy Treatment  Patient Details  Name: Heather [name] MRN: [id number] Date of Birth: 1945/11/15 Referring Provider (PT): Doree Albee, MD   Encounter Date: 05/18/2019  PT End of Session - 05/18/19 1517    Visit Number  4    Number of Visits  8    Date for PT Re-Evaluation  05/25/19    Authorization Type  Medicare (Secondary: Generic Commercial    Authorization Time Period  04/27/19 to 05/25/19    Authorization - Visit Number  4    Authorization - Number of Visits  10    PT Start Time  0998    PT Stop Time  1558    PT Time Calculation (min)  41 min    Activity Tolerance  Patient tolerated treatment well    Behavior During Therapy  [facility] for tasks assessed/performed       Past Medical History:  Diagnosis Date  . Adrenal adenoma   . Allergy   . Anemia   . Anxiety   . CAD (coronary artery disease)    50-60% mid LAD at cardiac catheterization 2014 [facility])  . Cataract   . Depression   . DJD (degenerative joint disease) of cervical spine 09/12/2016  . History of cardiomyopathy   . History of tobacco use   . Hyperlipidemia   . Neuromuscular disorder ([place])   . PAF (paroxysmal atrial fibrillation) [facility])    Diagnosis July 2017 - spontaneously resolved  . Statin intolerance     Past Surgical History:  Procedure Laterality Date  . ABDOMINAL HYSTERECTOMY  1980   endometriosis  . LEFT HEART CATH AND CORONARY ANGIOGRAPHY N/A 09/25/2018   Procedure: LEFT HEART CATH AND CORONARY ANGIOGRAPHY;  Surgeon: Belva Crome, MD;  Location: [place] [facility];  Service: Cardiovascular;  Laterality: N/A;  . TONSILLECTOMY  1954    There were no vitals filed for this visit.  Subjective Assessment - 05/18/19 1520    Subjective  Pt states that overall, she is very pleased with therapy thus far. She isn't quite 100% but she is a lot  better. Still has difficulty with going down stairs due to pain    Currently in Pain?  No/denies          [facility] Adult PT Treatment/Exercise - 05/18/19 0001      Knee/Hip Exercises: Stretches   Passive Hamstring Stretch  Left;3 reps;30 seconds    Passive Hamstring Stretch Limitations  standing, 12" step    Knee: Self-Stretch to increase Flexion  Left;5 reps;10 seconds    Knee: Self-Stretch Limitations  12" step    Gastroc Stretch  Both;3 reps;30 seconds    Gastroc Stretch Limitations  slant board      Knee/Hip Exercises: Standing   Heel Raises  Both;20 reps    Heel Raises Limitations  heel and toe on incline slope    Terminal Knee Extension  10 reps;Theraband    Theraband Level (Terminal Knee Extension)  Level 2 (Red)    Terminal Knee Extension Limitations  x5-10" holds    Hip Abduction  Both;2 sets;10 reps    Abduction Limitations  RTB    Hip Extension  Both;2 sets;10 reps    Extension Limitations  diagonals, RTB    Lateral Step Up  Left;10 reps;Step Height: 4"    Forward Step Up  Left;10 reps;Step Height: 4"  Step Down  Left;10 reps;Step Height: 4"    Wall Squat  2 sets;10 reps    Wall Squat Limitations  min cues for form    SLS with Vectors  BLE x5RT firm      Knee/Hip Exercises: Sidelying   Other Sidelying Knee/Hip Exercises  clamshells RTB x10 reps BLE      Manual Therapy   Manual Therapy  Edema management;Soft tissue mobilization    Manual therapy comments  completed seperate of other services    Edema Management  Retrograde massage with LE elevated    Soft tissue mobilization  STM to distal VLO to reduce palpable restriction             PT Education - 05/18/19 1517    Education Details  exercise technique, continue and updated HEP    Person(s) Educated  Patient    Methods  Explanation;Demonstration    Comprehension  Verbalized understanding;Returned demonstration       PT Short Term Goals - 04/27/19 1527      PT SHORT TERM GOAL #1   Title  Pt will  have improved L knee AROM from 0-120deg in order to demo reduced edema and reduce her pain.    Time  2    Period  Weeks    Status  New    Target Date  05/11/19      PT SHORT TERM GOAL #2   Title  Pt will have reduced edema at joint line by 2cm in order to reduce her pain and improve ROM.    Time  2    Period  Weeks    Status  New      PT SHORT TERM GOAL #3   Title  Pt will be able to perform R SLS for 15 sec or > to demo improved functional and core strength and to maximize stair ambulation.    Time  2    Period  Weeks    Status  New        PT Long Term Goals - 04/27/19 1528      PT LONG TERM GOAL #1   Title  Pt will have improved MMT by 1/2 grade throughout all mm groups in order to reduce pain and maximize gait.    Time  4    Period  Weeks    Status  New    Target Date  05/25/19      PT LONG TERM GOAL #2   Title  Pt will have 132ft improvement in 3MWT with 3/10 L knee pain or < with gait mechanics WFL in order to demo improved overall function and maximize return to her regular walking routine.    Time  4    Period  Weeks    Status  New      PT LONG TERM GOAL #3   Title  Pt will report being able to ambulate full flight of stairs reciprocally with 3/10 L knee pain or < to demo improved ROM and functional strength.    Time  4    Period  Weeks    Status  New      PT LONG TERM GOAL #4   Title  Pt will be able to perform 5xSTS in 10sec or < with proper form and with 3/10 L knee pain or < to demo improved balance and functional strength.    Time  4    Period  Weeks    Status  New  Plan - 05/18/19 1615    Clinical Impression Statement  Pt continues to report significant improvements with therapy sessions. Continued with established POC focusing on improving overall strength, ROM, and balance. Added TKE and clams to HEP to continue to focus on quad strength and hip strength. Began SLS with vectors this date for hip strength, wall squats for quad strength,  and step downs for improved quad strength and tolerance to stair ambulation. Ended with manual for STM to distal VLO and edema control to reduce pain. No pain reported at EOS. Continue as planned, progressing as able.    Personal Factors and Comorbidities  Age;Behavior Pattern;Comorbidity 3+;Past/Current Experience    Comorbidities  see above    Examination-Activity Limitations  Locomotion Level;Stairs;Transfers    Examination-Participation Restrictions  Community Activity;Yard Work    Stability/Clinical Decision Making  Stable/Uncomplicated    Rehab Potential  Fair    PT Frequency  2x / week    PT Duration  4 weeks    PT Treatment/Interventions  ADLs/Self Care Home Management;Aquatic Therapy;Cryotherapy;Electrical Stimulation;Moist Heat;Traction;Ultrasound;DME Instruction;Gait training;Stair training;Functional mobility training;Therapeutic activities;Therapeutic exercise;Balance training;Neuromuscular re-education;Patient/family education;Orthotic Fit/Training;Manual techniques;Passive range of motion;Dry needling;Taping    PT Next Visit Plan  Continue step downs, progressing to 6-7" steps; work on knee extension and to progress functional strengthening for quad and glutes.  manual STM for quad restrictions and patellar tendon tenderness, edema management    PT Home Exercise Plan  eval: quad set, heel slide, bridging; 6/25: SAQ; 7/6: TKE RTB, clams, RTB    Consulted and Agree with Plan of Care  Patient       Patient will benefit from skilled therapeutic intervention in order to improve the following deficits and impairments:  Decreased activity tolerance, Decreased balance, Decreased range of motion, Decreased strength, Difficulty walking, Hypomobility, Increased edema, Increased fascial restricitons, Increased muscle spasms, Impaired flexibility, Pain  Visit Diagnosis: 1. Chronic pain of left knee   2. Localized edema   3. Muscle weakness (generalized)   4. Difficulty in walking, not  elsewhere classified        Problem List Patient Active Problem List   Diagnosis Date Noted  . Abnormal myocardial perfusion study   . Muscle spasm 02/25/2018  . Tubular adenoma of colon 07/19/2017  . H. pylori infection 07/19/2017  . Thoracic back pain 02/08/2017  . Hx of colonic polyps 12/03/2016  . Family history of colon cancer 12/03/2016  . Encounter for therapeutic drug monitoring 11/21/2016  . CAD (coronary artery disease) 09/12/2016  . PAF (paroxysmal atrial fibrillation) ([place]) 09/12/2016  . Syncope 09/12/2016  . DJD (degenerative joint disease) of cervical spine 09/12/2016  . Statin intolerance 09/12/2016  . TIA (transient ischemic attack) 09/12/2016  . Diverticulitis of colon 09/12/2016  . Myofascial pain syndrome 09/06/2016  . Takotsubo syndrome 09/06/2016  . HLD (hyperlipidemia) 09/06/2016  . Depression with anxiety 09/06/2016  . Recurrent UTI (urinary tract infection) 09/06/2016       Geraldine Solar PT, DPT   [facility] [address] [place], [name], [address] Phone: [phone]   Fax:  [phone]  Name: Heather [name] MRN: [id number] Date of Birth: 1946/10/14

## 2019-05-19 ENCOUNTER — Other Ambulatory Visit: Payer: Self-pay | Admitting: Cardiovascular Disease

## 2019-05-20 ENCOUNTER — Encounter (HOSPITAL_COMMUNITY): Payer: Medicare Other

## 2019-05-25 ENCOUNTER — Encounter (HOSPITAL_COMMUNITY): Payer: Self-pay

## 2019-05-25 ENCOUNTER — Other Ambulatory Visit: Payer: Self-pay

## 2019-05-25 ENCOUNTER — Ambulatory Visit (HOSPITAL_COMMUNITY): Payer: Medicare Other

## 2019-05-25 DIAGNOSIS — R600 Localized edema: Secondary | ICD-10-CM | POA: Diagnosis not present

## 2019-05-25 DIAGNOSIS — M6281 Muscle weakness (generalized): Secondary | ICD-10-CM | POA: Diagnosis not present

## 2019-05-25 DIAGNOSIS — M25562 Pain in left knee: Secondary | ICD-10-CM | POA: Diagnosis not present

## 2019-05-25 DIAGNOSIS — R262 Difficulty in walking, not elsewhere classified: Secondary | ICD-10-CM

## 2019-05-25 DIAGNOSIS — G8929 Other chronic pain: Secondary | ICD-10-CM | POA: Diagnosis not present

## 2019-05-25 NOTE — Therapy (Signed)
[place], [name], [address] Phone: [phone]   Fax:  [phone]   Progress Note Reporting Period 04/27/19 to 05/25/19  See note below for Objective Data and Assessment of Progress/Goals.   Physical Therapy Treatment  Patient Details  Name: Heather [name] MRN: [id number] Date of Birth: 08-03-1946 Referring Provider (PT): Doree Albee, MD   Encounter Date: 05/25/2019  PT End of Session - 05/25/19 1519    Visit Number  5    Number of Visits  9    Date for PT Re-Evaluation  06/22/19    Authorization Type  Medicare (Secondary: Generic Commercial    Authorization Time Period  04/27/19 to 05/25/19; NEW: 05/25/19 to 06/22/19 for 4-week HEP POC    Authorization - Visit Number  5    Authorization - Number of Visits  10    PT Start Time  1520    PT Stop Time  1555    PT Time Calculation (min)  35 min    Activity Tolerance  Patient tolerated treatment well    Behavior During Therapy  [facility] for tasks assessed/performed       Past Medical History:  Diagnosis Date  . Adrenal adenoma   . Allergy   . Anemia   . Anxiety   . CAD (coronary artery disease)    50-60% mid LAD at cardiac catheterization 2014 [facility])  . Cataract   . Depression   . DJD (degenerative joint disease) of cervical spine 09/12/2016  . History of cardiomyopathy   . History of tobacco use   . Hyperlipidemia   . Neuromuscular disorder ([place])   . PAF (paroxysmal atrial fibrillation) [facility])    Diagnosis July 2017 - spontaneously resolved  . Statin intolerance     Past Surgical History:  Procedure Laterality Date  . ABDOMINAL HYSTERECTOMY  1980   endometriosis  . LEFT HEART CATH AND CORONARY ANGIOGRAPHY N/A 09/25/2018   Procedure: LEFT HEART CATH AND CORONARY ANGIOGRAPHY;  Surgeon: Belva Crome, MD;  Location: [place] [facility];  Service: Cardiovascular;  Laterality: N/A;  . TONSILLECTOMY  1954    There were no vitals filed for this  visit.  Subjective Assessment - 05/25/19 1522    Subjective  Pt states that she had a rough couple of days. Did some editing and had legs straight out and then when she went to stand, the knee tried to buckle on her and it was a little painful afterwards.    Currently in Pain?  No/denies         [facility] PT Assessment - 05/25/19 0001      Assessment   Medical Diagnosis  L knee pain    Referring Provider (PT)  Doree Albee, MD    Onset Date/Surgical Date  --   January 2020   Hand Dominance  Right    Next MD Visit  August 2020    Prior Therapy  none for present issue      Circumferential Edema   Circumferential - Left   35.4cm, joint line   was 38cm, joint line     AROM   Left Knee Extension  0   was 6   Left Knee Flexion  124   was 117     Strength   Right Hip Flexion  5/5   was 4+   Right Hip Extension  4/5   was 4-   Right Hip ABduction  4/5   was 4  Left Hip Flexion  5/5   was 4+   Left Hip Extension  4-/5   was 3+   Left Hip ABduction  4/5   was 4-   Right Knee Flexion  5/5   was 4+   Left Knee Flexion  4+/5   was 4-   Left Knee Extension  5/5   was 4+   Right Ankle Dorsiflexion  5/5   was 4+   Left Ankle Dorsiflexion  5/5   was 4+     Ambulation/Gait   Ambulation Distance (Feet)  766 Feet   3MWT; was 578 with increased pain   Assistive device  None    Gait Pattern  Within Functional Limits    Gait Comments  2/10 L knee pain during push-off      Balance   Balance Assessed  Yes      Static Standing Balance   Static Standing - Balance Support  No upper extremity supported    Static Standing Balance -  Activities   Single Leg Stance - Right Leg    Static Standing - Comment/# of Minutes  R: 12sec or < (multiple attempts to get to 12sec, avg 6sec or <)   was 7sec or < on R     Standardized Balance Assessment   Standardized Balance Assessment  Five Times Sit to Stand    Five times sit to stand comments   8.98sec, from chair, no UE, no pain, LLE  still slightly extended   was 12.3sec, from chair, LLE extended, painful            PT Education - 05/25/19 1520    Education Details  reassessment findings    Person(s) Educated  Patient    Methods  Explanation;Demonstration    Comprehension  Verbalized understanding;Returned demonstration       PT Short Term Goals - 05/25/19 1523      PT SHORT TERM GOAL #1   Title  Pt will have improved L knee AROM from 0-120deg in order to demo reduced edema and reduce her pain.    Baseline  7/13: 0-124deg    Time  2    Period  Weeks    Status  Achieved    Target Date  05/11/19      PT SHORT TERM GOAL #2   Title  Pt will have reduced edema at joint line by 2cm in order to reduce her pain and improve ROM.    Baseline  7/13: decreaesd from 38 to 35.4cm    Time  2    Period  Weeks    Status  Achieved      PT SHORT TERM GOAL #3   Title  Pt will be able to perform R SLS for 15 sec or > to demo improved functional and core strength and to maximize stair ambulation.    Baseline  7/13: 12sec or <    Time  2    Period  Weeks    Status  On-going        PT Long Term Goals - 05/25/19 1524      PT LONG TERM GOAL #1   Title  Pt will have improved MMT by 1/2 grade throughout all mm groups in order to reduce pain and maximize gait.    Baseline  7/13: see MMT    Time  4    Period  Weeks    Status  Achieved      PT LONG TERM GOAL #2  Title  Pt will have 160f improvement in 3MWT with 3/10 L knee pain or < with gait mechanics WFL in order to demo improved overall function and maximize return to her regular walking routine.    Baseline  7/13: 76105f 2/10 L knee pain, gait mechanics WFL    Time  4    Period  Weeks    Status  Achieved      PT LONG TERM GOAL #3   Title  Pt will report being able to ambulate full flight of stairs reciprocally with 3/10 L knee pain or < to demo improved ROM and functional strength.    Baseline  7/13: really doesn't hurt anymore, just twinges a little bit  still more difficult going down vs going up    Time  4    Period  Weeks    Status  Achieved      PT LONG TERM GOAL #4   Title  Pt will be able to perform 5xSTS in 10sec or < with proper form and with 3/10 L knee pain or < to demo improved balance and functional strength.    Baseline  7/13: 8.98sec, no pain    Time  4    Period  Weeks    Status  Achieved            Plan - 05/25/19 1612    Clinical Impression Statement  PT reassessed pt's goals and outcome measures this date. Pt has made tremendous progress as she has met all STG and LTG. Pt stating that she can ambulate stairs much easier (still has pain with descent but not nearly as bad), she can walk with much greater ease and less pain, and she can kneel to garden, which she couldn't do beforehand. Pt's AROM normalized form 0-124deg and her functional strength as improved AEB 5xSTS. Pt's main limiting factor is her last bit of remaining pain (very minor) balance on RLE (required multiple attempts to achieve 12sec, averaged 6sec or <) and the little "bubbles" of swelling around her knee and patellar tendon. Explained to pt that if she truly has a torn meniscus, this fluid is her body's natural response to try and heal and demo'd how she could address this as home as we have in sessions. PT feels pt is appropriate for 4-week HEP POC for her to manage symptoms independently at home and if she has an acute increase in pain, then she can return to therapy in 1 month for a f/u and re-evaluation of her s/s. Pt verbalized understanding and was in complete agreeance with this POC. Updated HEP for progress strengthening and balance training and she verbalized understanding.    Personal Factors and Comorbidities  Age;Behavior Pattern;Comorbidity 3+;Past/Current Experience    Comorbidities  see above    Examination-Activity Limitations  Locomotion Level;Stairs;Transfers    Examination-Participation Restrictions  Community Activity;Yard Work     Stability/Clinical Decision Making  Stable/Uncomplicated    Rehab Potential  Fair    PT Frequency  Other (comment)   1 f/u visit in 1 month after 4-week HEP POC   PT Duration  4 weeks    PT Treatment/Interventions  ADLs/Self Care Home Management;Aquatic Therapy;Cryotherapy;Electrical Stimulation;Moist Heat;Traction;Ultrasound;DME Instruction;Gait training;Stair training;Functional mobility training;Therapeutic activities;Therapeutic exercise;Balance training;Neuromuscular re-education;Patient/family education;Orthotic Fit/Training;Manual techniques;Passive range of motion;Dry needling;Taping    PT Next Visit Plan  re-evaluate after 4-week HEP POC PRN    PT Home Exercise Plan  eval: quad set, heel slide, bridging; 6/25: SAQ; 7/6: TKE RTB, clams, RTB; 7/13:  see extensive additions below    Consulted and Agree with Plan of Care  Patient       Patient will benefit from skilled therapeutic intervention in order to improve the following deficits and impairments:  Decreased activity tolerance, Decreased balance, Decreased range of motion, Decreased strength, Difficulty walking, Hypomobility, Increased edema, Increased fascial restricitons, Increased muscle spasms, Impaired flexibility, Pain  Visit Diagnosis: 1. Chronic pain of left knee   2. Localized edema   3. Muscle weakness (generalized)   4. Difficulty in walking, not elsewhere classified        Problem List Patient Active Problem List   Diagnosis Date Noted  . Abnormal myocardial perfusion study   . Muscle spasm 02/25/2018  . Tubular adenoma of colon 07/19/2017  . H. pylori infection 07/19/2017  . Thoracic back pain 02/08/2017  . Hx of colonic polyps 12/03/2016  . Family history of colon cancer 12/03/2016  . Encounter for therapeutic drug monitoring 11/21/2016  . CAD (coronary artery disease) 09/12/2016  . PAF (paroxysmal atrial fibrillation) ([place]) 09/12/2016  . Syncope 09/12/2016  . DJD (degenerative joint disease) of cervical  spine 09/12/2016  . Statin intolerance 09/12/2016  . TIA (transient ischemic attack) 09/12/2016  . Diverticulitis of colon 09/12/2016  . Myofascial pain syndrome 09/06/2016  . Takotsubo syndrome 09/06/2016  . HLD (hyperlipidemia) 09/06/2016  . Depression with anxiety 09/06/2016  . Recurrent UTI (urinary tract infection) 09/06/2016        Geraldine Solar PT, DPT   [facility] [address] [place], [name], [address] Phone: [phone]   Fax:  [phone]  Name: Heather [name] MRN: [id number] Date of Birth: 12-27-1945

## 2019-05-25 NOTE — Patient Instructions (Signed)
Access Code: [email or web address]  URL: [email or web address]  Date: 05/25/2019  Prepared by: Geraldine Solar   Exercises Supine Active Straight Leg Raise - 10 reps - 3 sets - 1x daily - 7x weekly Standing Hip Abduction Kicks - 10 reps - 3 sets - 1x daily - 7x weekly Diagonal Hip Extension with Resistance - 10 reps - 3 sets - 1x daily - 7x weekly Side Stepping with Resistance at Ankles and Counter Support - 10 reps - 3 sets - 1x daily - 7x weekly Step Up - 10 reps - 3 sets - 1x daily - 7x weekly Lateral Step Ups - 10 reps - 3 sets - 1x daily - 7x weekly Wall Quarter Squat - 10 reps - 3 sets - 1x daily - 7x weekly Tandem Stance in Corner - 10 reps - 3 sets - as long as you can hold - 1x daily - 7x weekly Tandem Stance with Eyes Closed in Corner - 10 reps - 3 sets - 1x daily - 7x weekly Single Leg Stance - 10 reps - 3 sets - 1x daily - 7x weekly Standing 3-Way Kick - 10 reps - 3 sets - 1x daily - 7x weekly

## 2019-05-27 ENCOUNTER — Ambulatory Visit (HOSPITAL_COMMUNITY): Payer: Medicare Other

## 2019-05-30 DIAGNOSIS — H353132 Nonexudative age-related macular degeneration, bilateral, intermediate dry stage: Secondary | ICD-10-CM | POA: Diagnosis not present

## 2019-06-04 ENCOUNTER — Telehealth: Payer: Self-pay | Admitting: Neurology

## 2019-06-04 ENCOUNTER — Ambulatory Visit (INDEPENDENT_AMBULATORY_CARE_PROVIDER_SITE_OTHER): Payer: Medicare Other | Admitting: Neurology

## 2019-06-04 ENCOUNTER — Other Ambulatory Visit: Payer: Self-pay

## 2019-06-04 VITALS — BP 101/60 | HR 56

## 2019-06-04 DIAGNOSIS — I25119 Atherosclerotic heart disease of native coronary artery with unspecified angina pectoris: Secondary | ICD-10-CM

## 2019-06-04 DIAGNOSIS — M7918 Myalgia, other site: Secondary | ICD-10-CM | POA: Diagnosis not present

## 2019-06-04 DIAGNOSIS — G8929 Other chronic pain: Secondary | ICD-10-CM | POA: Diagnosis not present

## 2019-06-04 DIAGNOSIS — M546 Pain in thoracic spine: Secondary | ICD-10-CM

## 2019-06-04 DIAGNOSIS — M62838 Other muscle spasm: Secondary | ICD-10-CM | POA: Diagnosis not present

## 2019-06-04 MED ORDER — OXYCODONE-ACETAMINOPHEN 5-325 MG PO TABS: ORAL_TABLET | ORAL | 0 refills | Status: DC

## 2019-06-04 NOTE — Progress Notes (Signed)
[facility]  PATIENT: Heather [name] DOB: 18-Jul-1946  REFERRING DOCTOR OR PCP:  Blanchie Serve SOURCE: patient  _________________________________   HISTORICAL  CHIEF COMPLAINT:  Chief Complaint  Patient presents with   Follow-up    RM 12. Last botox 03/03/19. 100Ux1vial. Lot: [id number]. Expiration: 02/23. NDC: 7517-0017-49    HISTORY OF PRESENT ILLNESS:  Heather [name] is a 73 y.o. woman who has had chronic right-sided mid back pain since 2013.      Update 06/04/2019: She has had pain in the right flank x several years.  Wprse pain is the right T6-T9 intracostal region.   She has done well with Botox and reports that pain has been reduced by about 75%.    She tolerates the injections well.   Pain has a cramp-like quality.   She takes Percocet prn if pain is more severe.  She is otherwise doing well.  She also has chronic A. Fib and is on warfarin  Update 03/03/2019: She has had pain in the right flank x several years.  Wprse pain is the right T6-T9 intracostal region.   She has done well with Botox and reports that pain has been reduced by about 75%.    She tolerates the injections well.   Pain has a cramp-like quality.   She takes Percocet prn if pain is more severe.  She is otherwise doing well.  She is on warfarin for chronic A. fib.  Update 11/28/2018: Her myofascial pain in the mid to lower right thoracic region has responded very well to the Botox therapy and is more than 75% better than it was initially.  Most of the pain is in the right intercostal region from T6-T9.  She has tolerated the Botox injections well.   When the pain is more severe, she will take a Percocet.  The quality of the pain is cramp-like.  Update 08/27/2018: She reports that the back pain is more than 50% better and the episodes of more significant pain have been significantly reduced.  She tolerated the injections without any problems.  Most of her pain is in the right intracoastal region  from T6-T9.     When pain is more severe, she will take a Percocet.  She does not take more than 2 or 3 a week  She is on Warfarin for chronic Afib.     Update 05/22/2018: She is here today for her first Botox injections for myofascial pain in the right intracoastal region (T7-T9)  Update 02/25/2018: Her right sided thoracic pain flared up a week ago and was intense x 2-3 days.   Opioids make her feel sick. Diazepam 10 mg, Ibu and MJ helped her pain.    Currently pain is much better (improved over last 24 hours).   She also used the [employer] patch.     The first TPI we did helped her pain but the more recent one made pain worse.     She is wondering about Botox.    We discussed it in more detail.She  Is on coumadin for Afib.     From 05/01/2017: Her pain is about 1 inch right of midline in the lower thoracic paraspinal region. 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 Pain worsens often with household chores or gardening. The pain may improve with using a TENS unit, a trigger point or Percocet. Heating pad also sometimes helps. When pain is more severe she has smoked marijuana with  some benefit. Cannabinol oil did not help. Flexeril did not help. More recently she took a tizanidine and was very sleepy though pain was better that day. More recently, she took a tizanidine 4 mg but fell asleep 1-2 hours.  The trigger point injection at the last visit (thoracic paraspinal) did not help.   She denies any numbness or weakness in her arms or legs.   No bowel or bladder changes.    She is on coumadin for recently diagnose atrial fibrillation.      She was getting trigger point injections every 3-6 months for most of the past 4 years but needed more frequently last year when she was packing up to move to this area.        I reviewed her MRI results from 10/14/2013. The MRI of the brain showed age related atrophy and minimal chronic buccal vascular ischemic change. MRI  of the cervical spine showed multilevel mild degenerative changes with left paramedian disc herniation at C3-C4 and left disc protrusion at C4-C5 and C5-C6 and midline disc herniation at C6-C7. There was no report of nerve root compression. MRI of the thoracic spine showed disc desiccation but no herniation or protrusions. MRI of the lumbar spine showed disc bulges at T12-L1 and L2-L3 and disc bulge with facet hypertrophy at L3-L4 and disc bulge with right foraminal annular tear at L4-L5.   REVIEW OF SYSTEMS: Constitutional: No fevers, chills, sweats, or change in appetite Eyes: No visual changes, double vision, eye pain Ear, nose and throat: No hearing loss, ear pain, nasal congestion, sore throat Cardiovascular: No chest pain, palpitations.   She has atrial fibrillation and is on warfarin Respiratory: No shortness of breath at rest or with exertion.   No wheezes GastrointestinaI: No nausea, vomiting, diarrhea, abdominal pain, fecal incontinence Genitourinary: No dysuria, urinary retention or frequency.  No nocturia. Musculoskeletal: No neck pain.  She has back pain/thoracic pain as above Integumentary: No rash, pruritus, skin lesions Neurological: as above Psychiatric: No depression at this time.  No anxiety Endocrine: No palpitations, diaphoresis, change in appetite, change in weigh or increased thirst Hematologic/Lymphatic: No anemia, purpura, petechiae. Allergic/Immunologic: No itchy/runny eyes, nasal congestion, recent allergic reactions, rashes  ALLERGIES: Allergies  Allergen Reactions   Statins Other (See Comments)    Intolerant all statins   Sulfa Antibiotics Rash    HOME MEDICATIONS:  Current Outpatient Medications:    Biotin 10000 MCG TABS, Take 10,000 mcg by mouth daily. , Disp: , Rfl:    buPROPion (WELLBUTRIN SR) 150 MG 12 hr tablet, Take 1 tablet (150 mg total) by mouth 2 (two) times daily. (Patient taking differently: Take 150 mg by mouth every other day. ),  Disp: 180 tablet, Rfl: 1   Cholecalciferol (VITAMIN D3) 125 MCG (5000 UT) TABS, Take 5,000 Units by mouth daily., Disp: , Rfl:    Coenzyme Q10 100 MG capsule, Take 100 mg by mouth daily. , Disp: , Rfl:    Cyanocobalamin (VITAMIN B-12) 2500 MCG SUBL, Place 2,500 mcg under the tongue daily., Disp: , Rfl:    docusate sodium (COLACE) 100 MG capsule, Take 100 mg by mouth daily., Disp: , Rfl:    estradiol (ESTRACE) 2 MG tablet, Take 2 mg by mouth daily., Disp: , Rfl:    Evolocumab (REPATHA) 140 MG/ML SOSY, Inject 140 mg into the skin every 14 (fourteen) days., Disp: 2 Syringe, Rfl: 11   NP THYROID 120 MG tablet, Take 120 mg by mouth daily., Disp: , Rfl:  oxyCODONE-acetaminophen (PERCOCET/ROXICET) 5-325 MG tablet, TAKE 1 TABLET AS NEEDED FOR SEVERE PAIN., Disp: 30 tablet, Rfl: 0   Probiotic Product (PROBIOTIC DAILY PO), Take 1 capsule by mouth daily., Disp: , Rfl:    progesterone (PROMETRIUM) 200 MG capsule, Take 400 mg by mouth daily. , Disp: , Rfl:    Testosterone 20 % CREA, Apply 1 application topically daily. , Disp: , Rfl:    warfarin (COUMADIN) 5 MG tablet, Take 2 tablets by mouth daily or as directed., Disp: 180 tablet, Rfl: 3  PAST MEDICAL HISTORY: Past Medical History:  Diagnosis Date   Adrenal adenoma    Allergy    Anemia    Anxiety    CAD (coronary artery disease)    50-60% mid LAD at cardiac catheterization 2014 ([place])   Cataract    Depression    DJD (degenerative joint disease) of cervical spine 09/12/2016   History of cardiomyopathy    History of tobacco use    Hyperlipidemia    Neuromuscular disorder ([facility])    PAF (paroxysmal atrial fibrillation) ([place])    Diagnosis July 2017 - spontaneously resolved   Statin intolerance     PAST SURGICAL HISTORY: Past Surgical History:  Procedure Laterality Date   ABDOMINAL HYSTERECTOMY  1980   endometriosis   LEFT HEART CATH AND CORONARY ANGIOGRAPHY N/A 09/25/2018   Procedure: LEFT HEART CATH AND  CORONARY ANGIOGRAPHY;  Surgeon: Belva Crome, MD;  Location: [place] [facility];  Service: Cardiovascular;  Laterality: N/A;   TONSILLECTOMY  1954    FAMILY HISTORY: Family History  Problem Relation Age of Onset   COPD Mother    Hearing loss Mother    Stroke Mother    Heart disease Mother        chf, died at 71   Arthritis Father    Hypertension Father    Heart disease Father        cad, pvd, died at 81   Heart disease Sister        heart valve   Arthritis Sister    Hyperlipidemia Brother    Hypertension Brother    Cancer - Prostate Brother     SOCIAL HISTORY:  Social History   Socioeconomic History   Marital status: Married    Spouse name: Al   Number of children: 0   Years of education: 18   Highest education level: Not on file  Occupational History   Occupation: Probation officer    Comment: from home  [place] resource strain: Not on file   Food insecurity    Worry: Not on file    Inability: Not on file   Transportation needs    Medical: Not on file    Non-medical: Not on file  Tobacco Use   Smoking status: Former Smoker    Packs/day: 0.00    Types: Cigarettes    Start date: 11/12/1965    Quit date: 09/06/2008    Years since quitting: 10.7   Smokeless tobacco: Never Used  Substance and Sexual Activity   Alcohol use: Yes    Alcohol/week: 1.0 standard drinks    Types: 1 Glasses of wine per week    Comment: occ   Drug use: Yes    Types: Marijuana    Comment: rarely   Sexual activity: Yes    Birth control/protection: Post-menopausal  Lifestyle   Physical activity    Days per week: Not on file    Minutes per session: Not on file  Stress: Not on file  Relationships   Social connections    Talks on phone: Not on file    Gets together: Not on file    Attends religious service: Not on file    Active member of club or organization: Not on file    Attends meetings of clubs or organizations: Not on file     Relationship status: Not on file   Intimate partner violence    Fear of current or ex partner: Not on file    Emotionally abused: Not on file    Physically abused: Not on file    Forced sexual activity: Not on file  Other Topics Concern   Not on file  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   General: The patient is well-developed and well-nourished and in no acute distress   Musculoskeletal: There is tenderness in the mid to lower thoracic intracoastal region from T6-T9 on the right.  No tenderness on the left..    Neurologic Exam  Mental status: The patient is alert and oriented x 3 at the time of the examination.  Focus and attention seem normal.   Speech is normal.  Cranial nerves: Extraocular muscles are intact.  Facial strength is normal.   . No obvious hearing deficits are noted.  Gait and station: Gait and station are normal.    DIAGNOSTIC DATA (LABS, IMAGING, TESTING) - I reviewed patient records, labs, notes, testing and imaging myself where available.      ASSESSMENT AND PLAN  1. Myofascial pain syndrome   2. Muscle spasm   3. Chronic right-sided thoracic back pain      1.  Inject 100 units of Botox split into tender points below the T6-T9 ribs into the intercostal space.  There were no complications and she tolerated the injections well.    2.   Stay active and exercise as tolerated 3.    she will return in 3-4 months or sooner if  new or worsening neurologic symptoms.   Teona Vargus A. Felecia Shelling, MD, PhD 0/81/3887, 1:95 PM Certified in Neurology, Clinical Neurophysiology, Sleep Medicine, Pain Medicine and Neuroimaging  [facility] [address] [place], [place] [address] [phone]

## 2019-06-04 NOTE — Telephone Encounter (Signed)
3 mo Botox inj  °

## 2019-06-11 NOTE — Telephone Encounter (Signed)
I called to schedule the patient but she did not answer so I left a VM asking her to call back. DW  

## 2019-06-18 ENCOUNTER — Telehealth (HOSPITAL_COMMUNITY): Payer: Self-pay

## 2019-06-18 NOTE — Telephone Encounter (Signed)
pt left a message on the vm statting that she did not need to re-evalued due to she is doing fine. cancelled the appt for her re-eval

## 2019-06-22 ENCOUNTER — Ambulatory Visit (HOSPITAL_COMMUNITY): Payer: Medicare Other

## 2019-06-29 DIAGNOSIS — H353132 Nonexudative age-related macular degeneration, bilateral, intermediate dry stage: Secondary | ICD-10-CM | POA: Diagnosis not present

## 2019-07-07 ENCOUNTER — Encounter (HOSPITAL_COMMUNITY): Payer: Self-pay

## 2019-07-07 NOTE — Therapy (Signed)
[facility] [address] [place], [place], [address] Phone: [phone]   Fax:  [phone]  Patient Details  Name: Heather [name] MRN: [id number] Date of Birth: 05/17/1946 Referring Provider:  No ref. provider found  Encounter Date: 07/07/2019   PHYSICAL THERAPY DISCHARGE SUMMARY  Visits from Start of Care: 5  Current functional level related to goals / functional outcomes: See last treatment note   Remaining deficits: See last treatment note   Education / Equipment: See last treatment note  Plan: Patient agrees to discharge.  Patient goals were met. Patient is being discharged due to being pleased with the current functional level.  ?????    Per front office staff, pt left voicemail stating that she did not need to be re-evaluated due to she is doing fine. Pt met 2/3 STGs and 4/4 LTGs at reassessment visit on 05/25/19.    Tori Broussard PT, DPT 07/07/19, 1:29 PM [phone]    [facility] [address] [place], [place], [address] Phone: [phone]   Fax:  [phone] 

## 2019-07-23 ENCOUNTER — Other Ambulatory Visit (INDEPENDENT_AMBULATORY_CARE_PROVIDER_SITE_OTHER): Payer: Self-pay

## 2019-07-23 MED ORDER — ESTRADIOL 2 MG PO TABS: 2.0000 mg | ORAL_TABLET | Freq: Every day | ORAL | 0 refills | Status: DC

## 2019-07-28 ENCOUNTER — Telehealth: Payer: Self-pay | Admitting: *Deleted

## 2019-07-28 NOTE — Telephone Encounter (Signed)
Patient calling with c/o afib bothering her intermittently accompanying with lack of energy.  Also notes a prickly sensation around her heart at time.  No c/o sob or dizziness.  Requesting OV with Dr. Domenic Polite.  Informed patient message will be sent to provider as nothing available in office for the next several weeks out.

## 2019-07-28 NOTE — Telephone Encounter (Signed)
Schedule has been tight, I can certainly see her when we have an opening.  Otherwise, she could potentially be seen sooner on the APP schedule.

## 2019-07-29 DIAGNOSIS — H353132 Nonexudative age-related macular degeneration, bilateral, intermediate dry stage: Secondary | ICD-10-CM | POA: Diagnosis not present

## 2019-07-30 ENCOUNTER — Ambulatory Visit (INDEPENDENT_AMBULATORY_CARE_PROVIDER_SITE_OTHER): Payer: Medicare Other | Admitting: *Deleted

## 2019-07-30 ENCOUNTER — Telehealth: Payer: Self-pay | Admitting: Cardiology

## 2019-07-30 ENCOUNTER — Telehealth: Payer: Self-pay | Admitting: *Deleted

## 2019-07-30 DIAGNOSIS — Z5181 Encounter for therapeutic drug level monitoring: Secondary | ICD-10-CM | POA: Diagnosis not present

## 2019-07-30 DIAGNOSIS — I480 Paroxysmal atrial fibrillation: Secondary | ICD-10-CM

## 2019-07-30 LAB — POCT INR: INR: 3.8 — AB (ref 2.0–3.0)

## 2019-07-30 NOTE — Telephone Encounter (Signed)
Pt called in results of 3.8

## 2019-07-30 NOTE — Telephone Encounter (Signed)

## 2019-07-30 NOTE — Patient Instructions (Signed)
Hold coumadin tonight then resume 2 tablets daily except 1 tablet on Mondays and Fridays.   Recheck in 1 week Continue greens LM on voice mail for patient

## 2019-07-30 NOTE — Telephone Encounter (Signed)
Left message on phone for pt to hold coumadin tonight then resume coumadin at current dose and recheck on Monday 08/03/19.  See coumadin note.

## 2019-07-31 NOTE — Telephone Encounter (Signed)
Looks like VV has been scheduled for December with Dr. Domenic Polite.

## 2019-08-11 ENCOUNTER — Telehealth: Payer: Self-pay | Admitting: Neurology

## 2019-08-11 ENCOUNTER — Other Ambulatory Visit: Payer: Self-pay | Admitting: Neurology

## 2019-08-11 MED ORDER — OXYCODONE-ACETAMINOPHEN 5-325 MG PO TABS: ORAL_TABLET | ORAL | 0 refills | Status: DC

## 2019-08-11 NOTE — Telephone Encounter (Signed)
Dr. Felecia Shelling- please specify directions for oxycodone. Current directions read: "take 1 tablet as needed". Do you want daily?

## 2019-08-11 NOTE — Telephone Encounter (Signed)
I will re-send it in as daily prn  #[phone]

## 2019-08-11 NOTE — Telephone Encounter (Signed)
[facility] in [place] called wanting to know what is the frequency for the pts oxyCODONE-acetaminophen (PERCOCET/ROXICET) 5-325 MG tablet They did not give a name due to the pharmacist going on lunch so they said you can call and speak to anyone as long as you tell them pt name you are calling on. Please advise.

## 2019-08-11 NOTE — Telephone Encounter (Signed)
Pt is needing the prescription sent in for her oxyCODONE-acetaminophen (PERCOCET/ROXICET) 5-325 MG tablet to the [facility] in [place]

## 2019-08-17 ENCOUNTER — Other Ambulatory Visit: Payer: Self-pay

## 2019-08-17 ENCOUNTER — Ambulatory Visit (INDEPENDENT_AMBULATORY_CARE_PROVIDER_SITE_OTHER): Payer: Medicare Other

## 2019-08-17 DIAGNOSIS — Z23 Encounter for immunization: Secondary | ICD-10-CM

## 2019-08-28 DIAGNOSIS — H353132 Nonexudative age-related macular degeneration, bilateral, intermediate dry stage: Secondary | ICD-10-CM | POA: Diagnosis not present

## 2019-09-07 ENCOUNTER — Other Ambulatory Visit (INDEPENDENT_AMBULATORY_CARE_PROVIDER_SITE_OTHER): Payer: Self-pay | Admitting: Internal Medicine

## 2019-09-07 MED ORDER — BUPROPION HCL ER (SR) 150 MG PO TB12: 150.0000 mg | ORAL_TABLET | Freq: Two times a day (BID) | ORAL | 1 refills | Status: DC

## 2019-09-09 ENCOUNTER — Ambulatory Visit (INDEPENDENT_AMBULATORY_CARE_PROVIDER_SITE_OTHER): Payer: Medicare Other | Admitting: Neurology

## 2019-09-09 ENCOUNTER — Other Ambulatory Visit: Payer: Self-pay

## 2019-09-09 ENCOUNTER — Telehealth: Payer: Self-pay | Admitting: Neurology

## 2019-09-09 ENCOUNTER — Encounter: Payer: Self-pay | Admitting: Neurology

## 2019-09-09 VITALS — BP 105/65 | HR 70 | Temp 96.5°F | Ht 64.0 in | Wt 162.0 lb

## 2019-09-09 DIAGNOSIS — M7918 Myalgia, other site: Secondary | ICD-10-CM

## 2019-09-09 DIAGNOSIS — I25119 Atherosclerotic heart disease of native coronary artery with unspecified angina pectoris: Secondary | ICD-10-CM | POA: Diagnosis not present

## 2019-09-09 DIAGNOSIS — M62838 Other muscle spasm: Secondary | ICD-10-CM | POA: Diagnosis not present

## 2019-09-09 DIAGNOSIS — G8929 Other chronic pain: Secondary | ICD-10-CM | POA: Diagnosis not present

## 2019-09-09 DIAGNOSIS — M546 Pain in thoracic spine: Secondary | ICD-10-CM | POA: Diagnosis not present

## 2019-09-09 NOTE — Telephone Encounter (Signed)
Return in about 3 months (around 12/10/2019) for botox 100 units.

## 2019-09-09 NOTE — Progress Notes (Signed)
[facility]  PATIENT: Heather [name] DOB: [birth year]  REFERRING DOCTOR OR PCP:  Blanchie Serve SOURCE: patient  _________________________________   HISTORICAL  CHIEF COMPLAINT:  Chief Complaint  Patient presents with  . Follow-up    RM 12, alone. Last botox appt 06/04/2019. 100Ux1 vial. Lot: [id number]. Expiration: 05/2022. [place]: [id number]. Buy/Bill, Danielle scheduled next botox appt for pt: 12/10/2019 at 3:30pm. Appt card reminder given to pt today.     HISTORY OF PRESENT ILLNESS:  Heather [name] is a 73 y.o. woman who has had chronic right-sided mid back pain since 2013.      Update 09/09/2019: She reports pain in the right flank.  She has had this for the past 3 to 4 years.  Pain is the right T6-T9 intracostal region.  The pain has a cramp-like quality.  She has been on Botox for the last 2 years and the pain improves.  She feels it has helped the pain about 75%.  She tolerates the injections well.    She takes Percocet prn if pain is more severe.  She is otherwise doing well.  She also has chronic A. Fib and is on warfarin.  She has not had any TIA or stroke symptoms.   Update 06/04/2019: She has had pain in the right flank x several years.  Wprse pain is the right T6-T9 intracostal region.   She has done well with Botox and reports that pain has been reduced by about 75%.   For couple weeks she had almost no pain.  She tolerates the injections well.   Pain has a cramp-like quality.   She takes Percocet prn if pain is more severe.  She is otherwise doing well.  She also has chronic A. Fib and is on warfarin  Update 03/03/2019: She has had pain in the right flank x several years.  Wprse pain is the right T6-T9 intracostal region.   She has done well with Botox and reports that pain has been reduced by about 75%.    She tolerates the injections well.   Pain has a cramp-like quality.   She takes Percocet prn if pain is more severe.  She is otherwise doing well.  She  is on warfarin for chronic A. fib.  Update 11/28/2018: Her myofascial pain in the mid to lower right thoracic region has responded very well to the Botox therapy and is more than 75% better than it was initially.  Most of the pain is in the right intercostal region from T6-T9.  She has tolerated the Botox injections well.   When the pain is more severe, she will take a Percocet.  The quality of the pain is cramp-like.  Update 08/27/2018: She reports that the back pain is more than 50% better and the episodes of more significant pain have been significantly reduced.  She tolerated the injections without any problems.  Most of her pain is in the right intracoastal region from T6-T9.     When pain is more severe, she will take a Percocet.  She does not take more than 2 or 3 a week  She is on Warfarin for chronic Afib.     Update 05/22/2018: She is here today for her first Botox injections for myofascial pain in the right intracoastal region (T7-T9)  Update 02/25/2018: Her right sided thoracic pain flared up a week ago and was intense x 2-3 days.   Opioids make her feel sick. Diazepam 10 mg, Ibu and MJ helped her pain.  Currently pain is much better (improved over last 24 hours).   She also used the [employer] patch.     The first TPI we did helped her pain but the more recent one made pain worse.     She is wondering about Botox.    We discussed it in more detail.She  Is on coumadin for Afib.     From 05/01/2017: Her pain is about 1 inch right of midline in the lower thoracic paraspinal region. 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 Pain worsens often with household chores or gardening. The pain may improve with using a TENS unit, a trigger point or Percocet. Heating pad also sometimes helps. When pain is more severe she has smoked marijuana with some benefit. Cannabinol oil did not help. Flexeril did not help. More recently she took a tizanidine and was very  sleepy though pain was better that day. More recently, she took a tizanidine 4 mg but fell asleep 1-2 hours.  The trigger point injection at the last visit (thoracic paraspinal) did not help.   She denies any numbness or weakness in her arms or legs.   No bowel or bladder changes.    She is on coumadin for recently diagnose atrial fibrillation.      She was getting trigger point injections every 3-6 months for most of the past 4 years but needed more frequently last year when she was packing up to move to this area.        I reviewed her MRI results from 10/14/2013. The MRI of the brain showed age related atrophy and minimal chronic microvascular ischemic change. MRI of the cervical spine showed multilevel mild degenerative changes with left paramedian disc herniation at C3-C4 and left disc protrusion at C4-C5 and C5-C6 and midline disc herniation at C6-C7. There was no report of nerve root compression. MRI of the thoracic spine showed disc desiccation but no herniation or protrusions. MRI of the lumbar spine showed disc bulges at T12-L1 and L2-L3 and disc bulge with facet hypertrophy at L3-L4 and disc bulge with right foraminal annular tear at L4-L5.   REVIEW OF SYSTEMS: Constitutional: No fevers, chills, sweats, or change in appetite Eyes: No visual changes, double vision, eye pain Ear, nose and throat: No hearing loss, ear pain, nasal congestion, sore throat Cardiovascular: No chest pain, palpitations.   She has atrial fibrillation and is on warfarin Respiratory: No shortness of breath at rest or with exertion.   No wheezes GastrointestinaI: No nausea, vomiting, diarrhea, abdominal pain, fecal incontinence Genitourinary: No dysuria, urinary retention or frequency.  No nocturia. Musculoskeletal: No neck pain.  She has back pain/thoracic pain as above Integumentary: No rash, pruritus, skin lesions Neurological: as above Psychiatric: No depression at this time.  No anxiety Endocrine: No  palpitations, diaphoresis, change in appetite, change in weigh or increased thirst Hematologic/Lymphatic: No anemia, purpura, petechiae. Allergic/Immunologic: No itchy/runny eyes, nasal congestion, recent allergic reactions, rashes  ALLERGIES: Allergies  Allergen Reactions  . Statins Other (See Comments)    Intolerant all statins  . Sulfa Antibiotics Rash    HOME MEDICATIONS:  Current Outpatient Medications:  .  Biotin 10000 MCG TABS, Take 10,000 mcg by mouth daily. , Disp: , Rfl:  .  buPROPion (WELLBUTRIN SR) 150 MG 12 hr tablet, Take 1 tablet (150 mg total) by mouth 2 (two) times daily., Disp: 180 tablet, Rfl: 1 .  Cholecalciferol (VITAMIN D3) 125 MCG (5000 UT) TABS, Take  15,000 Units by mouth daily. , Disp: , Rfl:  .  Coenzyme Q10 100 MG capsule, Take 100 mg by mouth daily. , Disp: , Rfl:  .  Cyanocobalamin (VITAMIN B-12) 2500 MCG SUBL, Place 2,500 mcg under the tongue daily., Disp: , Rfl:  .  docusate sodium (COLACE) 100 MG capsule, Take 100 mg by mouth daily., Disp: , Rfl:  .  estradiol (ESTRACE) 2 MG tablet, Take 1 tablet (2 mg total) by mouth daily., Disp: 90 tablet, Rfl: 0 .  NP THYROID 120 MG tablet, Take 120 mg by mouth daily., Disp: , Rfl:  .  oxyCODONE-acetaminophen (PERCOCET/ROXICET) 5-325 MG tablet, TAKE 1 TABLET DAILY AS NEEDED FOR SEVERE PAIN., Disp: 30 tablet, Rfl: 0 .  Probiotic Product (PROBIOTIC DAILY PO), Take 1 capsule by mouth daily., Disp: , Rfl:  .  progesterone (PROMETRIUM) 200 MG capsule, Take 200 mg by mouth daily. , Disp: , Rfl:  .  Testosterone 20 % CREA, Apply 1 application topically daily. , Disp: , Rfl:  .  warfarin (COUMADIN) 5 MG tablet, Take 2 tablets by mouth daily or as directed., Disp: 180 tablet, Rfl: 3  PAST MEDICAL HISTORY: Past Medical History:  Diagnosis Date  . Adrenal adenoma   . Allergy   . Anemia   . Anxiety   . CAD (coronary artery disease)    50-60% mid LAD at cardiac catheterization 2014 [facility])  . Cataract   .  Depression   . DJD (degenerative joint disease) of cervical spine 09/12/2016  . History of cardiomyopathy   . History of tobacco use   . Hyperlipidemia   . Neuromuscular disorder ([place])   . PAF (paroxysmal atrial fibrillation) [facility])    Diagnosis July 2017 - spontaneously resolved  . Statin intolerance     PAST SURGICAL HISTORY: Past Surgical History:  Procedure Laterality Date  . ABDOMINAL HYSTERECTOMY  1980   endometriosis  . LEFT HEART CATH AND CORONARY ANGIOGRAPHY N/A 09/25/2018   Procedure: LEFT HEART CATH AND CORONARY ANGIOGRAPHY;  Surgeon: Belva Crome, MD;  Location: [place] [facility];  Service: Cardiovascular;  Laterality: N/A;  . TONSILLECTOMY  1954    FAMILY HISTORY: Family History  Problem Relation Age of Onset  . COPD Mother   . Hearing loss Mother   . Stroke Mother   . Heart disease Mother        chf, died at 58  . Arthritis Father   . Hypertension Father   . Heart disease Father        cad, pvd, died at 68  . Heart disease Sister        heart valve  . Arthritis Sister   . Hyperlipidemia Brother   . Hypertension Brother   . Cancer - Prostate Brother     SOCIAL HISTORY:  Social History   Socioeconomic History  . Marital status: Married    Spouse name: Al  . Number of children: 0  . Years of education: 48  . Highest education level: Not on file  Occupational History  . 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 Financial resource strain: Not on file  . Food insecurity    Worry: Not on file    Inability: Not on file  . Transportation needs    Medical: Not on file    Non-medical: Not on file  Tobacco Use  . Smoking status: Former Smoker    Packs/day: 0.00    Types: Cigarettes  Start date: 11/12/1965    Quit date: 09/06/2008    Years since quitting: 11.0  . Smokeless tobacco: Never Used  Substance and Sexual Activity  . Alcohol use: Yes    Alcohol/week: 1.0 standard drinks    Types: 1 Glasses of wine per week     Comment: occ  . Drug use: Yes    Types: Marijuana    Comment: rarely  . Sexual activity: Yes    Birth control/protection: Post-menopausal  Lifestyle  . Physical activity    Days per week: Not on file    Minutes per session: Not on file  . Stress: Not on file  Relationships  . Social Herbalist on phone: Not on file    Gets together: Not on file    Attends religious service: Not on file    Active member of club or organization: Not on file    Attends meetings of clubs or organizations: Not on file    Relationship status: Not on file  . Intimate partner violence    Fear of current or ex partner: Not on file    Emotionally abused: Not on file    Physically abused: Not on file    Forced sexual activity: Not on file  Other Topics Concern  . 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   General: The patient is well-developed and well-nourished and in no acute distress   Musculoskeletal: There is tenderness in the mid to lower thoracic intracoastal region from T6-T9 on the right.  There is no tenderness on the left or in the neck or lower back.  Range of motion is normal in the spine.  Neurologic Exam  Mental status: The patient is alert and oriented x 3 at the time of the examination.  Focus and attention seem normal.   Speech is normal.  Cranial nerves: Extraocular muscles are intact.  Facial strength is normal.   . No obvious hearing deficits are noted.  Gait and station: Gait and station are normal.     ASSESSMENT AND PLAN  1. Myofascial pain syndrome   2. Chronic right-sided thoracic back pain   3. Muscle spasm      1.  Inject 100 units of Botox split into tender points below the  T6-T9 ribs into the intercostal space.  There were no complications and she tolerated the injections well.    2.   Stay active.  Exercise as tolerated.   3.    she will return in 3-4 months or sooner if  new or worsening neurologic symptoms.   Tamecia Mcdougald A. Felecia Shelling, MD, PhD [id number], 0000000 PM Certified in Neurology, Clinical Neurophysiology, Sleep Medicine, Pain Medicine and Neuroimaging  [facility] [address], [place], [place] [address] [phone]

## 2019-09-10 NOTE — Telephone Encounter (Signed)
Patient already has apt scheduled. DW

## 2019-09-17 DIAGNOSIS — H40013 Open angle with borderline findings, low risk, bilateral: Secondary | ICD-10-CM | POA: Diagnosis not present

## 2019-09-17 DIAGNOSIS — H35373 Puckering of macula, bilateral: Secondary | ICD-10-CM | POA: Diagnosis not present

## 2019-09-17 DIAGNOSIS — H353131 Nonexudative age-related macular degeneration, bilateral, early dry stage: Secondary | ICD-10-CM | POA: Diagnosis not present

## 2019-09-17 DIAGNOSIS — Z961 Presence of intraocular lens: Secondary | ICD-10-CM | POA: Diagnosis not present

## 2019-09-17 DIAGNOSIS — H26492 Other secondary cataract, left eye: Secondary | ICD-10-CM | POA: Diagnosis not present

## 2019-09-17 DIAGNOSIS — H2511 Age-related nuclear cataract, right eye: Secondary | ICD-10-CM | POA: Diagnosis not present

## 2019-09-17 DIAGNOSIS — H5712 Ocular pain, left eye: Secondary | ICD-10-CM | POA: Diagnosis not present

## 2019-09-17 DIAGNOSIS — H31001 Unspecified chorioretinal scars, right eye: Secondary | ICD-10-CM | POA: Diagnosis not present

## 2019-09-27 DIAGNOSIS — H353132 Nonexudative age-related macular degeneration, bilateral, intermediate dry stage: Secondary | ICD-10-CM | POA: Diagnosis not present

## 2019-10-02 ENCOUNTER — Other Ambulatory Visit (INDEPENDENT_AMBULATORY_CARE_PROVIDER_SITE_OTHER): Payer: Self-pay | Admitting: Internal Medicine

## 2019-10-19 ENCOUNTER — Telehealth (INDEPENDENT_AMBULATORY_CARE_PROVIDER_SITE_OTHER): Payer: Medicare Other | Admitting: Cardiology

## 2019-10-19 ENCOUNTER — Telehealth: Payer: Self-pay | Admitting: Licensed Clinical Social Worker

## 2019-10-19 ENCOUNTER — Encounter: Payer: Self-pay | Admitting: Cardiology

## 2019-10-19 VITALS — BP 110/70 | HR 68 | Wt 157.0 lb

## 2019-10-19 DIAGNOSIS — Z789 Other specified health status: Secondary | ICD-10-CM

## 2019-10-19 DIAGNOSIS — E782 Mixed hyperlipidemia: Secondary | ICD-10-CM | POA: Diagnosis not present

## 2019-10-19 DIAGNOSIS — I480 Paroxysmal atrial fibrillation: Secondary | ICD-10-CM | POA: Diagnosis not present

## 2019-10-19 DIAGNOSIS — I25119 Atherosclerotic heart disease of native coronary artery with unspecified angina pectoris: Secondary | ICD-10-CM

## 2019-10-19 MED ORDER — NITROGLYCERIN 0.4 MG SL SUBL: 0.4000 mg | SUBLINGUAL_TABLET | SUBLINGUAL | 3 refills | Status: DC | PRN

## 2019-10-19 NOTE — Progress Notes (Signed)
Faxed a copy of Lipids from [place] to [facility].

## 2019-10-19 NOTE — Patient Instructions (Addendum)
Medication Instructions:  Your physician recommends that you continue on your current medications as directed. Please refer to the Current Medication list given to you today.  I called in a prescription for nitroglycerin for you.  *If you need a refill on your cardiac medications before your next appointment, please call your pharmacy*  Lab Work: None today If you have labs (blood work) drawn today and your tests are completely normal, you will receive your results only by: Marland Kitchen MyChart Message (if you have MyChart) OR . A paper copy in the mail If you have any lab test that is abnormal or we need to change your treatment, we will call you to review the results.  Testing/Procedures: None today  Follow-Up: At [facility], you and your health needs are our priority.  As part of our continuing mission to provide you with exceptional heart care, we have created designated Provider Care Teams.  These Care Teams include your primary Cardiologist (physician) and Advanced Practice Providers (APPs -  Physician Assistants and Nurse Practitioners) who all work together to provide you with the care you need, when you need it.  Your next appointment:   6 month(s)  The format for your next appointment:   Either In Person or Virtual  Provider:   Rozann Lesches, MD  Other Instructions None

## 2019-10-19 NOTE — Progress Notes (Signed)
Virtual Visit via Video Note   This visit type was conducted due to national recommendations for restrictions regarding the COVID-19 Pandemic (e.g. social distancing) in an effort to limit this patient's exposure and mitigate transmission in our community.  Due to her co-morbid illnesses, this patient is at least at moderate risk for complications without adequate follow up.  This format is felt to be most appropriate for this patient at this time.  All issues noted in this document were discussed and addressed.  A limited physical exam was performed with this format.  Please refer to the patient's chart for her consent to telehealth for [facility].   Date:  10/19/2019   ID:  Heather [name], DOB November 25, 1945, MRN [id number]  Patient Location: Home Provider Location: Home  PCP:  Doree Albee, MD  Cardiologist:  Rozann Lesches, MD  Electrophysiologist:  None   Evaluation Performed:  Follow-Up Visit  Chief Complaint:  Cardiac follow-up  History of Present Illness:    Heather [name] is a 73 y.o. female last seen in June.  We communicated by video conferencing today.  She tells me that she has been doing well overall, brief sense of palpitations, no exertional chest pain.  She remains on Coumadin, last INR was 3.8.  She does not report any spontaneous bleeding problems.  Remaining medications are outlined below.  She is not on Repatha.  She has been on hormonal supplements followed by Dr. Anastasio Champion.  States that her last lipid panel looked better, we are requesting the results.  She has a known history of statin intolerance.  The patient does not have symptoms concerning for COVID-19 infection (fever, chills, cough, or new shortness of breath).    Past Medical History:  Diagnosis Date  . Adrenal adenoma   . Allergy   . Anemia   . Anxiety   . CAD (coronary artery disease)    50-60% mid LAD at cardiac catheterization 2014 [facility])  . Cataract   . Depression   . DJD  (degenerative joint disease) of cervical spine 09/12/2016  . History of cardiomyopathy   . History of tobacco use   . Hyperlipidemia   . Neuromuscular disorder ([place])   . PAF (paroxysmal atrial fibrillation) [facility])    Diagnosis July 2017 - spontaneously resolved  . Statin intolerance    Past Surgical History:  Procedure Laterality Date  . ABDOMINAL HYSTERECTOMY  1980   endometriosis  . CATARACT EXTRACTION Left 11/2018  . LEFT HEART CATH AND CORONARY ANGIOGRAPHY N/A 09/25/2018   Procedure: LEFT HEART CATH AND CORONARY ANGIOGRAPHY;  Surgeon: Belva Crome, MD;  Location: [facility] [place] [facility];  Service: Cardiovascular;  Laterality: N/A;  . TONSILLECTOMY  1954     Current Meds  Medication Sig  . Biotin 10000 MCG TABS Take 10,000 mcg by mouth daily.   Marland Kitchen buPROPion (WELLBUTRIN SR) 150 MG 12 hr tablet Take 1 tablet (150 mg total) by mouth 2 (two) times daily.  . Cholecalciferol (VITAMIN D3) 125 MCG (5000 UT) TABS Take 15,000 Units by mouth daily.   . Coenzyme Q10 100 MG capsule Take 100 mg by mouth daily.   . Cyanocobalamin (VITAMIN B-12) 2500 MCG SUBL Place 2,500 mcg under the tongue daily.  Marland Kitchen docusate sodium (COLACE) 100 MG capsule Take 100 mg by mouth daily.  Marland Kitchen estradiol (ESTRACE) 2 MG tablet Take 1 tablet (2 mg total) by mouth daily.  . NP THYROID 120 MG tablet Take 1 tablet by mouth once daily  . oxyCODONE-acetaminophen (  PERCOCET/ROXICET) 5-325 MG tablet TAKE 1 TABLET DAILY AS NEEDED FOR SEVERE PAIN.  Marland Kitchen Probiotic Product (PROBIOTIC DAILY PO) Take 1 capsule by mouth daily.  . progesterone (PROMETRIUM) 200 MG capsule Take 200 mg by mouth daily.   . Testosterone 20 % CREA Apply 1 application topically daily.   Marland Kitchen warfarin (COUMADIN) 5 MG tablet Take 2 tablets by mouth daily or as directed.     Allergies:   Statins and Sulfa antibiotics   Social History   Tobacco Use  . Smoking status: Former Smoker    Packs/day: 0.00    Types: Cigarettes    Start date: 11/12/1965    Quit date:  09/06/2008    Years since quitting: 11.1  . Smokeless tobacco: Never Used  Substance Use Topics  . Alcohol use: Yes    Alcohol/week: 1.0 standard drinks    Types: 1 Glasses of wine per week    Comment: occ  . Drug use: Yes    Types: Marijuana    Comment: rarely     Family Hx: The patient's family history includes Arthritis in her father and sister; COPD in her mother; Cancer - Prostate in her brother; Hearing loss in her mother; Heart disease in her father, mother, and sister; Hyperlipidemia in her brother; Hypertension in her brother and father; Stroke in her mother.  ROS:   Please see the history of present illness. All other systems reviewed and are negative.   Prior CV studies:   The following studies were reviewed today:  Cardiac catheterization 09/25/2018:  Right dominant coronary anatomy  Short left main, widely patent  Ostial 25% LAD followed by a segmental 30 to 40% region of narrowing. One large diagonal branch arises from this region and is as large as the left anterior descending which has no significant focal obstruction.  Circumflex has proximal irregularity up to 30%. 2 large obtuse marginal branches are free of obstruction.  The right coronary artery contains a relatively short segmental 40 to 50% narrowing.  Left ventricular function is normal with EF greater than 55%. LVEDP is normal.  RECOMMENDATIONS:   Aggressive risk prevention: LDL less than 70, blood pressure 130/80 mmHg or less, hemoglobin A1c less than 7, 150 minutes or more of moderate physical activity per week, consider screening for sleep apnea, and smoking cessation if appropriate.  Consider aspirin although will be at increased bleeding risk when used with Coumadin therapy.  Labs/Other Tests and Data Reviewed:    EKG:  An ECG dated 09/25/2018 was personally reviewed today and demonstrated:  Sinus bradycardia with decreased R wave progression.  Recent Labs:  09/18/2018: BUN 16;  Creatinine, Ser 0.73; Hemoglobin 12.2; Platelets 223; Potassium 4.5; Sodium 131   Wt Readings from Last 3 Encounters:  10/19/19 157 lb (71.2 kg)  09/09/19 162 lb (73.5 kg)  04/22/19 167 lb 12.8 oz (76.1 kg)     Objective:    Vital Signs:  BP 110/70   Pulse 68   Wt 157 lb (71.2 kg)   LMP 07/08/1979 (Approximate)   SpO2 98%   BMI 26.95 kg/m    General: Patient appeared comfortable seated in her home. HEENT: Conjunctiva and lids normal. Skin: Normal appearance of color. Lungs: Patient spoke comfortably in full sentences, not obviously short of breath.  No coughing or wheezing audible. Musculoskeletal: No kyphosis evident. Neuropsychiatric: Gaze conjugate.  Speech pattern normal.  Patient observed to move upper extremities freely.  Affect appropriate.  ASSESSMENT & PLAN:    1.  Paroxysmal atrial  fibrillation, she reports no progressive palpitations and continues on Coumadin for stroke prophylaxis.  2.  Mild to moderate nonobstructive coronary atherosclerosis by cardiac catheterization last year.  She is not on aspirin given concurrent use of Coumadin.  Prescription provided for as needed nitroglycerin.  3.  Severe hyperlipidemia with known statin intolerance.  She decided to stop Repatha and is focusing on diet and hormone supplementation under the direction of Dr. Anastasio Champion.  I encouraged regular exercise.  Requesting interval lab work for review.  COVID-19 Education: The signs and symptoms of COVID-19 were discussed with the patient and how to seek care for testing (follow up with PCP or arrange E-visit).  The importance of social distancing was discussed today.  Time:   Today, I have spent 14 minutes with the patient with telehealth technology discussing the above problems.     Medication Adjustments/Labs and Tests Ordered: Current medicines are reviewed at length with the patient today.  Concerns regarding medicines are outlined above.   Tests Ordered: No orders of the  defined types were placed in this encounter.   Medication Changes: Meds ordered this encounter  Medications  . nitroGLYCERIN (NITROSTAT) 0.4 MG SL tablet    Sig: Place 1 tablet (0.4 mg total) under the tongue every 5 (five) minutes as needed for chest pain.    Dispense:  25 tablet    Refill:  3    Follow Up:  Either In Person or Virtual 6 months.  Signed, Rozann Lesches, MD  10/19/2019 11:54 AM    [place]

## 2019-10-19 NOTE — Progress Notes (Signed)
Please send the result of the lipid panel from [facility].  Thanks.

## 2019-10-19 NOTE — Telephone Encounter (Signed)
CSW referred to assist patient with obtaining a BP cuff. CSW contacted patient to inform cuff will be delivered to home. Message left. CSW available as needed. Jackie Chance Karam, LCSW, CCSW-MCS [phone]  

## 2019-10-23 ENCOUNTER — Other Ambulatory Visit: Payer: Self-pay

## 2019-10-23 MED ORDER — NITROGLYCERIN 0.4 MG SL SUBL: 0.4000 mg | SUBLINGUAL_TABLET | SUBLINGUAL | 3 refills | Status: DC | PRN

## 2019-10-23 NOTE — Telephone Encounter (Signed)
Refilled NTG 

## 2019-10-27 DIAGNOSIS — H353132 Nonexudative age-related macular degeneration, bilateral, intermediate dry stage: Secondary | ICD-10-CM | POA: Diagnosis not present

## 2019-10-28 ENCOUNTER — Encounter (INDEPENDENT_AMBULATORY_CARE_PROVIDER_SITE_OTHER): Payer: Self-pay

## 2019-10-28 ENCOUNTER — Ambulatory Visit (INDEPENDENT_AMBULATORY_CARE_PROVIDER_SITE_OTHER): Payer: Medicare Other | Admitting: Internal Medicine

## 2019-11-02 ENCOUNTER — Telehealth (INDEPENDENT_AMBULATORY_CARE_PROVIDER_SITE_OTHER): Payer: Self-pay

## 2019-11-02 ENCOUNTER — Telehealth: Payer: Self-pay | Admitting: Neurology

## 2019-11-02 ENCOUNTER — Other Ambulatory Visit (INDEPENDENT_AMBULATORY_CARE_PROVIDER_SITE_OTHER): Payer: Self-pay | Admitting: Internal Medicine

## 2019-11-02 MED ORDER — PROGESTERONE MICRONIZED 200 MG PO CAPS: 200.0000 mg | ORAL_CAPSULE | Freq: Every day | ORAL | 1 refills | Status: DC

## 2019-11-02 MED ORDER — OXYCODONE-ACETAMINOPHEN 5-325 MG PO TABS: ORAL_TABLET | ORAL | 0 refills | Status: DC

## 2019-11-02 NOTE — Telephone Encounter (Signed)
I just did that few minutes ago.  Thanks.

## 2019-11-02 NOTE — Telephone Encounter (Signed)
Meds ordered this encounter  Medications   oxyCODONE-acetaminophen (PERCOCET/ROXICET) 5-325 MG tablet    Sig: TAKE 1 TABLET DAILY AS NEEDED FOR SEVERE PAIN.    Dispense:  30 tablet    Refill:  0   Penni Bombard, MD [id number], [id number] PM Certified in Neurology, Neurophysiology and Neuroimaging  [facility] [address] [place], [place] [address] [phone]

## 2019-11-02 NOTE — Telephone Encounter (Signed)
1) Medication(s) Requested (by name): oxyCODONE-acetaminophen (PERCOCET/ROXICET) 5-325 MG tablet   2) Pharmacy of Choice: [facility] in [place]

## 2019-11-04 ENCOUNTER — Other Ambulatory Visit: Payer: Self-pay

## 2019-11-04 ENCOUNTER — Ambulatory Visit (INDEPENDENT_AMBULATORY_CARE_PROVIDER_SITE_OTHER): Payer: Medicare Other | Admitting: Internal Medicine

## 2019-11-04 ENCOUNTER — Encounter (INDEPENDENT_AMBULATORY_CARE_PROVIDER_SITE_OTHER): Payer: Self-pay | Admitting: Internal Medicine

## 2019-11-04 VITALS — BP 144/72 | HR 64 | Ht 64.0 in | Wt 159.6 lb

## 2019-11-04 DIAGNOSIS — R6882 Decreased libido: Secondary | ICD-10-CM

## 2019-11-04 DIAGNOSIS — E785 Hyperlipidemia, unspecified: Secondary | ICD-10-CM | POA: Diagnosis not present

## 2019-11-04 DIAGNOSIS — F32A Depression, unspecified: Secondary | ICD-10-CM

## 2019-11-04 DIAGNOSIS — F329 Major depressive disorder, single episode, unspecified: Secondary | ICD-10-CM

## 2019-11-04 DIAGNOSIS — I2510 Atherosclerotic heart disease of native coronary artery without angina pectoris: Secondary | ICD-10-CM

## 2019-11-04 DIAGNOSIS — E559 Vitamin D deficiency, unspecified: Secondary | ICD-10-CM

## 2019-11-04 DIAGNOSIS — I480 Paroxysmal atrial fibrillation: Secondary | ICD-10-CM | POA: Diagnosis not present

## 2019-11-04 DIAGNOSIS — I25119 Atherosclerotic heart disease of native coronary artery with unspecified angina pectoris: Secondary | ICD-10-CM | POA: Diagnosis not present

## 2019-11-04 NOTE — Progress Notes (Signed)
Metrics: Intervention Frequency ACO  Documented Smoking Status Yearly  Screened one or more times in 24 months  Cessation Counseling or  Active cessation medication Past 24 months  Past 24 months   Guideline developer: UpToDate (See UpToDate for funding source) Date Released: 2014       Wellness Office Visit  Subjective:  Patient ID: Heather [name], female    DOB: 12-31-1945  Age: 73 y.o. MRN: [id number]  CC: This lady comes in for follow-up of anxiety and depression, hyperlipidemia which is quite significant, vitamin D deficiency and coronary artery disease. HPI  She is doing reasonably well.  She continues with bioidentical hormone therapy also for menopausal symptoms as well as testosterone therapy.  She has not taken progesterone for the last several days as she has not had refill.  Her levels previously were adequate on the current dose.  She is tolerating testosterone therapy but only takes it a few times in a week and not every day now because her levels were extremely high before. She continues on vitamin D3 supplementation for vitamin D deficiency. She has been offered injectable medication for hyperlipidemia but she declined.  She has intolerance of statins in the past.  She denies any chest pain currently or dyspnea or palpitations or limb weakness. She continues on desiccated thyroid for her hypothyroidism.  This is an increased dose from previous.  She has tolerated this higher dose.  She also has been good with nutrition lately. Past Medical History:  Diagnosis Date  . Adrenal adenoma   . Allergy   . Anemia   . Anxiety   . CAD (coronary artery disease)    50-60% mid LAD at cardiac catheterization 2014 [facility])  . Cataract   . Depression   . DJD (degenerative joint disease) of cervical spine 09/12/2016  . History of cardiomyopathy   . History of tobacco use   . Hyperlipidemia   . Neuromuscular disorder ([place])   . PAF (paroxysmal atrial fibrillation) [facility])    Diagnosis July 2017 - spontaneously resolved  . Statin intolerance       Family History  Problem Relation Age of Onset  . COPD Mother   . Hearing loss Mother   . Stroke Mother   . Heart disease Mother        chf, died at 6  . Arthritis Father   . Hypertension Father   . Heart disease Father        cad, pvd, died at 3  . Heart disease Sister        heart valve  . Arthritis Sister   . Hyperlipidemia Brother   . Hypertension Brother   . 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 Smoking status: Former Smoker    Packs/day: 0.00    Types: Cigarettes    Start date: 11/12/1965    Quit date: 09/06/2008    Years since quitting: 11.1  . Smokeless tobacco: Never Used  Substance Use Topics  . Alcohol use: Yes    Alcohol/week: 1.0 standard drinks    Types: 1 Glasses of wine per week    Comment: occ    Current Meds  Medication Sig  . Biotin 10000 MCG TABS Take 10,000 mcg by mouth daily.   Marland Kitchen buPROPion [facility] SR) 150  MG 12 hr tablet Take 1 tablet (150 mg total) by mouth 2 (two) times daily.  . Cholecalciferol (VITAMIN D3) 125 MCG (5000 UT) TABS Take 15,000 Units by mouth daily.   . Coenzyme Q10 100 MG capsule Take 100 mg by mouth daily.   . Cyanocobalamin (VITAMIN B-12) 2500 MCG SUBL Place 2,500 mcg under the tongue daily.  Marland Kitchen docusate sodium (COLACE) 100 MG capsule Take 100 mg by mouth 2 (two) times daily.   Marland Kitchen estradiol (ESTRACE) 2 MG tablet Take 1 tablet (2 mg total) by mouth daily.  . nitroGLYCERIN (NITROSTAT) 0.4 MG SL tablet Place 1 tablet (0.4 mg total) under the tongue every 5 (five) minutes as needed for chest pain.  . NP THYROID 120 MG tablet Take 1 tablet by mouth once daily  . oxyCODONE-acetaminophen (PERCOCET/ROXICET) 5-325 MG tablet TAKE 1 TABLET DAILY AS NEEDED FOR SEVERE PAIN.  Marland Kitchen progesterone (PROMETRIUM)  200 MG capsule Take 1 capsule (200 mg total) by mouth daily.  . Testosterone 20 % CREA Apply 1 application topically daily.   Marland Kitchen warfarin (COUMADIN) 5 MG tablet Take 2 tablets by mouth daily or as directed.     Nutrition  She has been doing intermittent fasting and misses breakfast most days and tries to eat healthier. Sleep  Adequate.  Exercise  Walking but no other regular exercise. Bio Identical Hormones  Testosterone therapy is being used off label for symptoms of testosterone deficiency and benefits that it produces based on several studies.  These benefits include decreasing body fat, increasing in lean muscle mass and increasing in bone density.  There is improvement of memory, cognition.  There is improvement in exercise tolerance and endurance.  Testosterone therapy has also been shown to be protective against coronary artery disease, cerebrovascular disease, diabetes, hypertension and degenerative joint disease. I have discussed with the patient the FDA warnings regarding testosterone therapy, benefits and side effects and modes of administration as well as monitoring blood levels and side effects  on a regular basis The patient is agreeable that testosterone therapy should be an integral part of his/her wellness,quality of life and prevention of chronic disease.  Micronized progesterone is being used in this patient for multiple benefits based on studies including protection against uterine cancer, breast cancer, osteoporosis and heart disease. The patient has been counseled regarding side effects, benefits and modes of administration. The patient is agreeable that this therapy is an integral part of her wellness, quality of life and prevention of chronic disease.  Estradiol is being used in this patient for multiple benefits based on several studies including protection against heart disease, cerebrovascular disease, osteoporosis, colon cancer, Alzheimer's disease, macular  degeneration and cataracts. The patient has been counseled regarding benefits and side effects and modes of administration. The patient is agreeable that this therapy is an integral to part of her wellness, quality of life and prevention of chronic disease.  Objective:   Today's Vitals: BP (!) 144/72   Pulse 64   Ht _0  (1.626 m)   Wt 159 lb 9.6 oz (72.4 kg)   LMP 07/08/1979 (Approximate)   BMI 27.40 kg/m  Vitals with BMI 11/04/2019 10/19/2019 09/09/2019  Height _1  - _2   Weight 159 lbs 10 oz 157 lbs 162 lbs  BMI 58.52 - 77.82  Systolic 423 536 144  Diastolic 72 70 65  Pulse 64 68 70     Physical Exam  She looks systemically well and she in fact has lost 9 pounds since  the last time I saw her in the office.  Heart sounds are present without murmurs or added sounds.  Lung fields are clear.  She is alert and orientated.     Assessment   1. Coronary artery disease involving native heart without angina pectoris, unspecified vessel or lesion type   2. PAF (paroxysmal atrial fibrillation) ([place])   3. Hyperlipidemia, unspecified hyperlipidemia type   4. Depression, unspecified depression type   5. Decreased libido   6. Vitamin D deficiency disease       Tests ordered Orders Placed This Encounter  Procedures  . CMP with eGFR([facility])  . Lipid Panel  . Vitamin D, 25-hydroxy  . TSH  . T3, Free  . Testos,Total,Free and SHBG (Female)     Plan: 1. Blood work is ordered as above. 2. She will continue with anticoagulation therapy for paroxysmal atrial fibrillation. 3. We will check her lipid panel to see how it is now. 4. She will continue with antidepressant medication as before. 5. She will continue with bioidentical hormone therapy and we will check testosterone levels at a reduced dose that she is taking. 6. She will continue with vitamin D3 15,000 units daily and we will check levels today. 7. Further recommendations will depend on blood results and I will see her  in about 3 to 4 months time for follow-up   No orders of the defined types were placed in this encounter.   Doree Albee, MD

## 2019-11-26 ENCOUNTER — Other Ambulatory Visit: Payer: Self-pay

## 2019-11-26 ENCOUNTER — Ambulatory Visit: Payer: Medicare Other | Attending: Internal Medicine

## 2019-11-26 DIAGNOSIS — H353132 Nonexudative age-related macular degeneration, bilateral, intermediate dry stage: Secondary | ICD-10-CM | POA: Diagnosis not present

## 2019-11-26 DIAGNOSIS — Z20822 Contact with and (suspected) exposure to covid-19: Secondary | ICD-10-CM | POA: Diagnosis not present

## 2019-11-27 LAB — NOVEL CORONAVIRUS, NAA: SARS-CoV-2, NAA: DETECTED — AB

## 2019-11-28 ENCOUNTER — Other Ambulatory Visit: Payer: Self-pay | Admitting: Nurse Practitioner

## 2019-11-28 ENCOUNTER — Telehealth: Payer: Self-pay | Admitting: Nurse Practitioner

## 2019-11-28 DIAGNOSIS — I2510 Atherosclerotic heart disease of native coronary artery without angina pectoris: Secondary | ICD-10-CM

## 2019-11-28 DIAGNOSIS — U071 COVID-19: Secondary | ICD-10-CM

## 2019-11-28 NOTE — Progress Notes (Signed)
  I connected by phone with Heather [name] on 11/28/2019 at 7:29 PM to discuss the potential use of an new treatment for mild to moderate COVID-19 viral infection in non-hospitalized patients.  This patient is a 74 y.o. female that meets the FDA criteria for Emergency Use Authorization of bamlanivimab or casirivimab\imdevimab.  Has a (+) direct SARS-CoV-2 viral test result  Has mild or moderate COVID-19   Is ? 74 years of age and weighs ? 40 kg  Is NOT hospitalized due to COVID-19  Is NOT requiring oxygen therapy or requiring an increase in baseline oxygen flow rate due to COVID-19  Is within 10 days of symptom onset  Has at least one of the high risk factor(s) for progression to severe COVID-19 and/or hospitalization as defined in EUA.  Specific high risk criteria : >/= 74 yo   I have spoken and communicated the following to the patient or parent/caregiver:  1. FDA has authorized the emergency use of bamlanivimab and casirivimab\imdevimab for the treatment of mild to moderate COVID-19 in adults and pediatric patients with positive results of direct SARS-CoV-2 viral testing who are 65 years of age and older weighing at least 40 kg, and who are at high risk for progressing to severe COVID-19 and/or hospitalization.  2. The significant known and potential risks and benefits of bamlanivimab and casirivimab\imdevimab, and the extent to which such potential risks and benefits are unknown.  3. Information on available alternative treatments and the risks and benefits of those alternatives, including clinical trials.  4. Patients treated with bamlanivimab and casirivimab\imdevimab should continue to self-isolate and use infection control measures (e.g., wear mask, isolate, social distance, avoid sharing personal items, clean and disinfect "high touch" surfaces, and frequent handwashing) according to CDC guidelines.   5. The patient or parent/caregiver has the option to accept or refuse  bamlanivimab or casirivimab\imdevimab .  After reviewing this information with the patient, The patient agreed to proceed with receiving the bamlanimivab infusion and will be provided a copy of the Fact sheet prior to receiving the infusion.   Heather [name] 11/28/2019 7:29 PM

## 2019-11-28 NOTE — Telephone Encounter (Signed)
Called to discuss with [name] about Covid symptoms and the use of bamlanivimab, a monoclonal antibody infusion for those with mild to moderate Covid symptoms and at a high risk of hospitalization.     Pt qualifies for this infusion at the [facility] infusion center due to co-morbid conditions and/or a member of an at-risk group. Symptoms tier reviewed as well as criteria for ending isolation.  Symptoms reviewed that would warrant ED/Hospital evaluation. Preventative practices reviewed.   Patient has decided to receive infusion. Appointment has been scheduled and instructions given and also sent via MyChart at patient's request.     Patient Active Problem List   Diagnosis Date Noted  . Abnormal myocardial perfusion study   . Muscle spasm 02/25/2018  . Tubular adenoma of colon 07/19/2017  . H. pylori infection 07/19/2017  . Thoracic back pain 02/08/2017  . Hx of colonic polyps 12/03/2016  . Family history of colon cancer 12/03/2016  . Encounter for therapeutic drug monitoring 11/21/2016  . CAD (coronary artery disease) 09/12/2016  . PAF (paroxysmal atrial fibrillation) ([place]) 09/12/2016  . Syncope 09/12/2016  . DJD (degenerative joint disease) of cervical spine 09/12/2016  . Statin intolerance 09/12/2016  . TIA (transient ischemic attack) 09/12/2016  . Diverticulitis of colon 09/12/2016  . Myofascial pain syndrome 09/06/2016  . Takotsubo syndrome 09/06/2016  . HLD (hyperlipidemia) 09/06/2016  . Depression 09/06/2016  . Recurrent UTI (urinary tract infection) 09/06/2016   Alda Lea, AGPCNP-BC  Phone: [phone] Pager: [phone] Amion: Bjorn Pippin

## 2019-11-30 ENCOUNTER — Telehealth (INDEPENDENT_AMBULATORY_CARE_PROVIDER_SITE_OTHER): Payer: Self-pay | Admitting: Nurse Practitioner

## 2019-11-30 NOTE — Telephone Encounter (Signed)
This patient had called and left a message regarding whether or not she should undergo antibody infusion for treatment of COVID-19.  I was able to call her return phone number, but did discuss this with her husband who is also diagnosed with COVID-19 and was wondering whether or not he should undergo the infusion.  He tells me that they have already talked to the doctor that recommended this treatment, and have decided to go forward with treatment.  I encouraged them to call us any other questions or concerns.

## 2019-12-01 ENCOUNTER — Ambulatory Visit (HOSPITAL_COMMUNITY)
Admission: RE | Admit: 2019-12-01 | Discharge: 2019-12-01 | Disposition: A | Discharge status: Home / Self Care | Payer: Medicare Other | Source: Ambulatory Visit | Attending: Pulmonary Disease | Admitting: Pulmonary Disease

## 2019-12-01 ENCOUNTER — Encounter (INDEPENDENT_AMBULATORY_CARE_PROVIDER_SITE_OTHER): Payer: Self-pay | Admitting: Internal Medicine

## 2019-12-01 DIAGNOSIS — I2510 Atherosclerotic heart disease of native coronary artery without angina pectoris: Secondary | ICD-10-CM | POA: Insufficient documentation

## 2019-12-01 DIAGNOSIS — Z23 Encounter for immunization: Secondary | ICD-10-CM | POA: Insufficient documentation

## 2019-12-01 DIAGNOSIS — U071 COVID-19: Secondary | ICD-10-CM | POA: Insufficient documentation

## 2019-12-01 MED ORDER — SODIUM CHLORIDE 0.9 % IV SOLN
INTRAVENOUS | Status: DC | PRN
  Administered 2019-12-01: 250 mL via INTRAVENOUS

## 2019-12-01 MED ORDER — METHYLPREDNISOLONE SODIUM SUCC 125 MG IJ SOLR: 125.0000 mg | Freq: Once | INTRAMUSCULAR | Status: DC | PRN

## 2019-12-01 MED ORDER — FAMOTIDINE IN NACL 20-0.9 MG/50ML-% IV SOLN: 20.0000 mg | Freq: Once | INTRAVENOUS | Status: DC | PRN

## 2019-12-01 MED ORDER — DIPHENHYDRAMINE HCL 50 MG/ML IJ SOLN: 50.0000 mg | Freq: Once | INTRAMUSCULAR | Status: DC | PRN

## 2019-12-01 MED ORDER — ALBUTEROL SULFATE HFA 108 (90 BASE) MCG/ACT IN AERS: 2.0000 | INHALATION_SPRAY | Freq: Once | RESPIRATORY_TRACT | Status: DC | PRN

## 2019-12-01 MED ORDER — SODIUM CHLORIDE 0.9 % IV SOLN
700.0000 mg | Freq: Once | INTRAVENOUS | Status: AC
  Administered 2019-12-01: 700 mg via INTRAVENOUS
  Filled 2019-12-01: qty 20

## 2019-12-01 MED ORDER — EPINEPHRINE 0.3 MG/0.3ML IJ SOAJ: 0.3000 mg | Freq: Once | INTRAMUSCULAR | Status: DC | PRN

## 2019-12-01 NOTE — Discharge Instructions (Signed)
COVID-19: How to Protect Yourself and Others Know how it spreads  There is currently no vaccine to prevent coronavirus disease 2019 (COVID-19).  The best way to prevent illness is to avoid being exposed to this virus.  The virus is thought to spread mainly from person-to-person. ? Between people who are in close contact with one another (within about 6 feet). ? Through respiratory droplets produced when an infected person coughs, sneezes or talks. ? These droplets can land in the mouths or noses of people who are nearby or possibly be inhaled into the lungs. ? COVID-19 may be spread by people who are not showing symptoms. Everyone should Clean your hands often  Wash your hands often with soap and water for at least 20 seconds especially after you have been in a public place, or after blowing your nose, coughing, or sneezing.  If soap and water are not readily available, use a hand sanitizer that contains at least 60% alcohol. Cover all surfaces of your hands and rub them together until they feel dry.  Avoid touching your eyes, nose, and mouth with unwashed hands. Avoid close contact  Limit contact with others as much as possible.  Avoid close contact with people who are sick.  Put distance between yourself and other people. ? Remember that some people without symptoms may be able to spread virus. ? This is especially important for people who are at higher risk of getting very sick.www.cdc.gov/coronavirus/2019-ncov/need-extra-precautions/people-at-higher-risk.html Cover your mouth and nose with a mask when around others  You could spread COVID-19 to others even if you do not feel sick.  Everyone should wear a mask in public settings and when around people not living in their household, especially when social distancing is difficult to maintain. ? Masks should not be placed on young children under age 2, anyone who has trouble breathing, or is unconscious, incapacitated or otherwise  unable to remove the mask without assistance.  The mask is meant to protect other people in case you are infected.  Do NOT use a facemask meant for a healthcare worker.  Continue to keep about 6 feet between yourself and others. The mask is not a substitute for social distancing. Cover coughs and sneezes  Always cover your mouth and nose with a tissue when you cough or sneeze or use the inside of your elbow.  Throw used tissues in the trash.  Immediately wash your hands with soap and water for at least 20 seconds. If soap and water are not readily available, clean your hands with a hand sanitizer that contains at least 60% alcohol. Clean and disinfect  Clean AND disinfect frequently touched surfaces daily. This includes tables, doorknobs, light switches, countertops, handles, desks, phones, keyboards, toilets, faucets, and sinks. www.cdc.gov/coronavirus/2019-ncov/prevent-getting-sick/disinfecting-your-home.html  If surfaces are dirty, clean them: Use detergent or soap and water prior to disinfection.  Then, use a household disinfectant. You can see a list of EPA-registered household disinfectants here. cdc.gov/coronavirus 07/15/2019 This information is not intended to replace advice given to you by your health care provider. Make sure you discuss any questions you have with your health care provider. Document Revised: 07/23/2019 Document Reviewed: 05/21/2019 Elsevier Patient Education  2020 Elsevier Inc. What types of side effects do monoclonal antibody drugs cause?  Common side effects  In general, the more common side effects caused by monoclonal antibody drugs include: . Allergic reactions, such as hives or itching . Flu-like signs and symptoms, including chills, fatigue, fever, and muscle aches and pains . Nausea, vomiting .   Diarrhea . Skin rashes . Low blood pressure   The CDC is recommending patients who receive monoclonal antibody treatments wait at least 90 days before  being vaccinated.  Currently, there are no data on the safety and efficacy of mRNA COVID-19 vaccines in persons who received monoclonal antibodies or convalescent plasma as part of COVID-19 treatment. Based on the estimated half-life of such therapies as well as evidence suggesting that reinfection is uncommon in the 90 days after initial infection, vaccination should be deferred for at least 90 days, as a precautionary measure until additional information becomes available, to avoid interference of the antibody treatment with vaccine-induced immune responses.  

## 2019-12-01 NOTE — Progress Notes (Signed)
  Diagnosis: COVID-19  Physician: Dr. Wright  Procedure: [facility] Med: bamlanivimab infusion - Provided patient with bamlanimivab fact sheet for patients, parents and caregivers prior to infusion.  Complications: No immediate complications noted.  Discharge: Discharged home   Heather [name] 12/01/2019   

## 2019-12-07 ENCOUNTER — Encounter (INDEPENDENT_AMBULATORY_CARE_PROVIDER_SITE_OTHER): Payer: Self-pay | Admitting: Internal Medicine

## 2019-12-08 ENCOUNTER — Other Ambulatory Visit (INDEPENDENT_AMBULATORY_CARE_PROVIDER_SITE_OTHER): Payer: Self-pay | Admitting: Internal Medicine

## 2019-12-08 MED ORDER — BUPROPION HCL ER (SR) 150 MG PO TB12: 150.0000 mg | ORAL_TABLET | Freq: Two times a day (BID) | ORAL | 1 refills | Status: DC

## 2019-12-10 ENCOUNTER — Other Ambulatory Visit: Payer: Self-pay

## 2019-12-10 ENCOUNTER — Ambulatory Visit (INDEPENDENT_AMBULATORY_CARE_PROVIDER_SITE_OTHER): Payer: Medicare Other | Admitting: Neurology

## 2019-12-10 ENCOUNTER — Encounter: Payer: Self-pay | Admitting: Neurology

## 2019-12-10 ENCOUNTER — Ambulatory Visit: Payer: Medicare Other | Admitting: Neurology

## 2019-12-10 VITALS — BP 114/69 | HR 55 | Temp 97.3°F

## 2019-12-10 DIAGNOSIS — Z8616 Personal history of COVID-19: Secondary | ICD-10-CM | POA: Insufficient documentation

## 2019-12-10 DIAGNOSIS — I480 Paroxysmal atrial fibrillation: Secondary | ICD-10-CM

## 2019-12-10 DIAGNOSIS — I2510 Atherosclerotic heart disease of native coronary artery without angina pectoris: Secondary | ICD-10-CM | POA: Diagnosis not present

## 2019-12-10 DIAGNOSIS — G8929 Other chronic pain: Secondary | ICD-10-CM

## 2019-12-10 DIAGNOSIS — M62838 Other muscle spasm: Secondary | ICD-10-CM

## 2019-12-10 DIAGNOSIS — M546 Pain in thoracic spine: Secondary | ICD-10-CM

## 2019-12-10 DIAGNOSIS — M7918 Myalgia, other site: Secondary | ICD-10-CM

## 2019-12-10 HISTORY — DX: Personal history of COVID-19: Z86.16

## 2019-12-10 NOTE — Progress Notes (Signed)
[facility]  PATIENT: Heather [name] DOB: 05/31/1946  REFERRING DOCTOR OR PCP:  Blanchie Serve SOURCE: patient  _________________________________   HISTORICAL  CHIEF COMPLAINT:  Chief Complaint  Patient presents with  . Follow-up    pt presents today for botox. states no concerns there. she is wandering if Dr Felecia Shelling would take a listen to her lungs.   . Botulinum Toxin Injection    Lot: [id number]. Expiration: 08/2022. NDC: [id number]. 100U vialx1    HISTORY OF PRESENT ILLNESS:  Heather [name] is a 74 y.o. woman who has had chronic right-sided mid back pain since 2013.      Update 12/10/2019: She reports pain in the right flank similar to pain she has experienced in the past..  She has had this for the past 4 years.  Pain is the right T6-T9 intracostal region.  The pain has a cramp-like quality.  She has been on Botox for the last 2+ years and the pain improves for the majority of the 23-month interval.  She feels it has helped the pain about 75%.  She tolerates the injections well.    She takes Percocet prn if pain is more severe.  She is otherwise doing well.  She was visiting a friend who had severe CHF and she and her husband both caught Covid-19.   She had fatigue and congestion but no other symptoms.   She still feels congested.  She has chronic atrial fibrillation is on warfarin.  She has not had a stroke or TIA.  Update 09/09/2019: She reports pain in the right flank.  She has had this for the past 3 to 4 years.  Pain is the right T6-T9 intracostal region.  The pain has a cramp-like quality.  She has been on Botox for the last 2 years and the pain improves.  She feels it has helped the pain about 75%.  She tolerates the injections well.    She takes Percocet prn if pain is more severe.  She is otherwise doing well.  She also has chronic A. Fib and is on warfarin.  She has not had any TIA or stroke symptoms.   Update 06/04/2019: She has had pain in the right  flank x several years.  Wprse pain is the right T6-T9 intracostal region.   She has done well with Botox and reports that pain has been reduced by about 75%.   For couple weeks she had almost no pain.  She tolerates the injections well.   Pain has a cramp-like quality.   She takes Percocet prn if pain is more severe.  She is otherwise doing well.  She also has chronic A. Fib and is on warfarin  Update 03/03/2019: She has had pain in the right flank x several years.  Wprse pain is the right T6-T9 intracostal region.   She has done well with Botox and reports that pain has been reduced by about 75%.    She tolerates the injections well.   Pain has a cramp-like quality.   She takes Percocet prn if pain is more severe.  She is otherwise doing well.  She is on warfarin for chronic A. fib.  Update 11/28/2018: Her myofascial pain in the mid to lower right thoracic region has responded very well to the Botox therapy and is more than 75% better than it was initially.  Most of the pain is in the right intercostal region from T6-T9.  She has tolerated the Botox injections well.  When the pain is more severe, she will take a Percocet.  The quality of the pain is cramp-like.  Update 08/27/2018: She reports that the back pain is more than 50% better and the episodes of more significant pain have been significantly reduced.  She tolerated the injections without any problems.  Most of her pain is in the right intracoastal region from T6-T9.     When pain is more severe, she will take a Percocet.  She does not take more than 2 or 3 a week  She is on Warfarin for chronic Afib.     Update 05/22/2018: She is here today for her first Botox injections for myofascial pain in the right intracoastal region (T7-T9)  Update 02/25/2018: Her right sided thoracic pain flared up a week ago and was intense x 2-3 days.   Opioids make her feel sick. Diazepam 10 mg, Ibu and MJ helped her pain.    Currently pain is much better  (improved over last 24 hours).   She also used the [employer] patch.     The first TPI we did helped her pain but the more recent one made pain worse.     She is wondering about Botox.    We discussed it in more detail.She  Is on coumadin for Afib.     From 05/01/2017: Her pain is about 1 inch right of midline in the lower thoracic paraspinal region. 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 Pain worsens often with household chores or gardening. The pain may improve with using a TENS unit, a trigger point or Percocet. Heating pad also sometimes helps. When pain is more severe she has smoked marijuana with some benefit. Cannabinol oil did not help. Flexeril did not help. More recently she took a tizanidine and was very sleepy though pain was better that day. More recently, she took a tizanidine 4 mg but fell asleep 1-2 hours.  The trigger point injection at the last visit (thoracic paraspinal) did not help.   She denies any numbness or weakness in her arms or legs.   No bowel or bladder changes.    She is on coumadin for recently diagnose atrial fibrillation.      She was getting trigger point injections every 3-6 months for most of the past 4 years but needed more frequently last year when she was packing up to move to this area.        I reviewed her MRI results from 10/14/2013. The MRI of the brain showed age related atrophy and minimal chronic microvascular ischemic change. MRI of the cervical spine showed multilevel mild degenerative changes with left paramedian disc herniation at C3-C4 and left disc protrusion at C4-C5 and C5-C6 and midline disc herniation at C6-C7. There was no report of nerve root compression. MRI of the thoracic spine showed disc desiccation but no herniation or protrusions. MRI of the lumbar spine showed disc bulges at T12-L1 and L2-L3 and disc bulge with facet hypertrophy at L3-L4 and disc bulge with right foraminal annular tear at L4-L5.    REVIEW OF SYSTEMS: Constitutional: No fevers, chills, sweats, or change in appetite Eyes: No visual changes, double vision, eye pain Ear, nose and throat: No hearing loss, ear pain, nasal congestion, sore throat Cardiovascular: No chest pain, palpitations.   She has atrial fibrillation and is on warfarin Respiratory: No shortness of breath at rest or with exertion.   No wheezes GastrointestinaI: No nausea, vomiting,  diarrhea, abdominal pain, fecal incontinence Genitourinary: No dysuria, urinary retention or frequency.  No nocturia. Musculoskeletal: No neck pain.  She has back pain/thoracic pain as above Integumentary: No rash, pruritus, skin lesions Neurological: as above Psychiatric: No depression at this time.  No anxiety Endocrine: No palpitations, diaphoresis, change in appetite, change in weigh or increased thirst Hematologic/Lymphatic: No anemia, purpura, petechiae. Allergic/Immunologic: No itchy/runny eyes, nasal congestion, recent allergic reactions, rashes  ALLERGIES: Allergies  Allergen Reactions  . Statins Other (See Comments)    Intolerant all statins  . Sulfa Antibiotics Rash    HOME MEDICATIONS:  Current Outpatient Medications:  .  Biotin 10000 MCG TABS, Take 10,000 mcg by mouth daily. , Disp: , Rfl:  .  buPROPion (WELLBUTRIN SR) 150 MG 12 hr tablet, Take 1 tablet (150 mg total) by mouth 2 (two) times daily., Disp: 180 tablet, Rfl: 1 .  Cholecalciferol (VITAMIN D3) 125 MCG (5000 UT) TABS, Take 15,000 Units by mouth daily. , Disp: , Rfl:  .  Coenzyme Q10 100 MG capsule, Take 100 mg by mouth daily. , Disp: , Rfl:  .  Cyanocobalamin (VITAMIN B-12) 2500 MCG SUBL, Place 2,500 mcg under the tongue daily., Disp: , Rfl:  .  docusate sodium (COLACE) 100 MG capsule, Take 100 mg by mouth 2 (two) times daily. , Disp: , Rfl:  .  estradiol (ESTRACE) 2 MG tablet, Take 1 tablet (2 mg total) by mouth daily., Disp: 90 tablet, Rfl: 0 .  nitroGLYCERIN (NITROSTAT) 0.4 MG SL tablet,  Place 1 tablet (0.4 mg total) under the tongue every 5 (five) minutes as needed for chest pain., Disp: 25 tablet, Rfl: 3 .  oxyCODONE-acetaminophen (PERCOCET/ROXICET) 5-325 MG tablet, TAKE 1 TABLET DAILY AS NEEDED FOR SEVERE PAIN., Disp: 30 tablet, Rfl: 0 .  progesterone (PROMETRIUM) 200 MG capsule, Take 1 capsule (200 mg total) by mouth daily., Disp: 90 capsule, Rfl: 1 .  Testosterone 20 % CREA, Apply 1 application topically daily. , Disp: , Rfl:  .  warfarin (COUMADIN) 5 MG tablet, Take 2 tablets by mouth daily or as directed., Disp: 180 tablet, Rfl: 3 .  NP THYROID 120 MG tablet, Take 1 tablet by mouth once daily, Disp: 30 tablet, Rfl: 3  PAST MEDICAL HISTORY: Past Medical History:  Diagnosis Date  . Adrenal adenoma   . Allergy   . Anemia   . Anxiety   . CAD (coronary artery disease)    50-60% mid LAD at cardiac catheterization 2014 [facility])  . Cataract   . Depression   . DJD (degenerative joint disease) of cervical spine 09/12/2016  . History of cardiomyopathy   . History of tobacco use   . Hyperlipidemia   . Neuromuscular disorder ([place])   . PAF (paroxysmal atrial fibrillation) [facility])    Diagnosis July 2017 - spontaneously resolved  . Statin intolerance     PAST SURGICAL HISTORY: Past Surgical History:  Procedure Laterality Date  . ABDOMINAL HYSTERECTOMY  1980   endometriosis  . CATARACT EXTRACTION Left 11/2018  . LEFT HEART CATH AND CORONARY ANGIOGRAPHY N/A 09/25/2018   Procedure: LEFT HEART CATH AND CORONARY ANGIOGRAPHY;  Surgeon: Belva Crome, MD;  Location: [facility];  Service: Cardiovascular;  Laterality: N/A;  . TONSILLECTOMY  1954    FAMILY HISTORY: Family History  Problem Relation Age of Onset  . COPD Mother   . Hearing loss Mother   . Stroke Mother   . Heart disease Mother        chf, died  at 76  . Arthritis Father   . Hypertension Father   . Heart disease Father        cad, pvd, died at 16  . Heart disease Sister        heart valve  .  Arthritis Sister   . Hyperlipidemia Brother   . Hypertension Brother   . Cancer - Prostate Brother     SOCIAL HISTORY:  Social History   Socioeconomic History  . Marital status: Married    Spouse name: Al  . Number of children: 0  . Years of education: 35  . Highest education level: Not on file  Occupational History  . Occupation: Probation officer    Comment: from home  Tobacco Use  . Smoking status: Former Smoker    Packs/day: 0.00    Types: Cigarettes    Start date: 11/12/1965    Quit date: 09/06/2008    Years since quitting: 11.2  . Smokeless tobacco: Never Used  Substance and Sexual Activity  . Alcohol use: Yes    Alcohol/week: 1.0 standard drinks    Types: 1 Glasses of wine per week    Comment: occ  . Drug use: Yes    Types: Marijuana    Comment: rarely  . Sexual activity: Yes    Birth control/protection: Post-menopausal  Other Topics Concern  . 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 Difficulty of Paying Living Expenses: Not on file  Food Insecurity:   . Worried About Charity fundraiser in the Last Year: Not on file  . Ran Out of Food in the Last Year: Not on file  Transportation Needs:   . Lack of Transportation (Medical): Not on file  . Lack of Transportation (Non-Medical): Not on file  Physical Activity:   . Days of Exercise per Week: Not on file  . Minutes of Exercise per Session: Not on file  Stress:   . Feeling of Stress : Not on file  Social Connections:   . Frequency of Communication with Friends and Family: Not on file  . Frequency of Social Gatherings with Friends and Family: Not on file  . Attends Religious Services: Not on file  . Active Member of Clubs or Organizations: Not on file  . Attends Archivist Meetings: Not on file  . Marital Status: Not on file  Intimate Partner Violence:    . Fear of Current or Ex-Partner: Not on file  . Emotionally Abused: Not on file  . Physically Abused: Not on file  . Sexually Abused: Not on file     PHYSICAL EXAM  Vitals:   12/10/19 1527  BP: 114/69  Pulse: (!) 55  Temp: (!) 97.3 F (36.3 C)    There is no height or weight on file to calculate BMI.   General: The patient is well-developed and well-nourished and in no acute distress.  Breath sounds were normal.   Musculoskeletal: There is tenderness in the mid to lower thoracic intracoastal region from T6-T9 on the right.  There is no tenderness on the left or in the neck or lower back.  Range of motion is normal in the spine.  Neurologic Exam  Mental status: The patient is alert and oriented x 3 at the time of the examination.  Focus and attention seem normal.  Speech is normal.  Cranial nerves: Extraocular muscles are intact.  Facial strength is normal.   . No obvious hearing deficits are noted.  Gait and station: Gait and station are normal.     ASSESSMENT AND PLAN  1. Myofascial pain syndrome   2. Chronic right-sided thoracic back pain   3. Muscle spasm   4. PAF (paroxysmal atrial fibrillation) ([place])   5. History of COVID-19      1.  Inject 100 units of Botox split into tender points below the T6-T9 ribs into the intercostal space.  There were no complications and she tolerated the injections well.    2.   Stay active.  Exercise as tolerated.   3.   She has a recent COVID-19 infection if she continues to have the congested sensation in her chest or if she experiences shortness of breath or fever she needs to contact her primary care physician.   4.   She will return in 3-4 months or sooner if  new or worsening neurologic symptoms.   Shaynah Hund A. Felecia Shelling, MD, PhD [id number], [id number] PM Certified in Neurology, Clinical Neurophysiology, Sleep Medicine, Pain Medicine and Neuroimaging  [facility] [address], [place], [place] [address]  [phone]

## 2019-12-14 ENCOUNTER — Telehealth (INDEPENDENT_AMBULATORY_CARE_PROVIDER_SITE_OTHER): Payer: Medicare Other | Admitting: Nurse Practitioner

## 2019-12-14 ENCOUNTER — Encounter (INDEPENDENT_AMBULATORY_CARE_PROVIDER_SITE_OTHER): Payer: Self-pay | Admitting: Nurse Practitioner

## 2019-12-14 VITALS — BP 130/80 | HR 55 | Resp 15 | Ht 64.0 in | Wt 155.0 lb

## 2019-12-14 DIAGNOSIS — R059 Cough, unspecified: Secondary | ICD-10-CM

## 2019-12-14 DIAGNOSIS — R05 Cough: Secondary | ICD-10-CM

## 2019-12-14 MED ORDER — AZITHROMYCIN 250 MG PO TABS: ORAL_TABLET | ORAL | 0 refills | Status: DC

## 2019-12-14 NOTE — Progress Notes (Signed)
Due to national recommendations of social distancing related to the [place] pandemic, an audio/visual tele-health visit was felt to be the most appropriate encounter type for this patient today. I connected with  [name] on 12/14/19 utilizing audio/visual technology and verified that I am speaking with the correct person using two identifiers. The patient was located at their home, and I was located at the office of [facility] during the encounter. I discussed the limitations of evaluation and management by telemedicine. The patient expressed understanding and agreed to proceed.      Subjective:  Patient ID: Heather [name], female    DOB: 25-Feb-1946  Age: 74 y.o. MRN: [id number]  CC:  Chief Complaint  Patient presents with  . congestion    head and chest, had Covid but just can't seem to get rid of all of the congestion in her head/nasal area, pale yellow, feels some pressure in her chest but mostly in her head/nasal passages/throat.       HPI  This patient arrives today via virtual visit for the above.  She tells me she was diagnosed with COVID-19 and has since recovered.  Per chart review looks like she was diagnosed on January 14.  She did undergo monoclonal antibody infusion for the treatment of this.  She tells me overall she is been feeling better.  She does have some lingering congestion.  She also tells me she started to experience a productive cough.  She tells me the sputum she produces yellow.  She denies any severe shortness of breath.  She denies any fevers.  She has been taking NyQuil and Sudafed over-the-counter as needed with mild symptom relief.   Past Medical History:  Diagnosis Date  . Adrenal adenoma   . Allergy   . Anemia   . Anxiety   . CAD (coronary artery disease)    50-60% mid LAD at cardiac catheterization 2014 [facility])  . Cataract   . Depression   . DJD (degenerative joint disease) of cervical spine 09/12/2016  . History of  cardiomyopathy   . History of tobacco use   . Hyperlipidemia   . Neuromuscular disorder ([place])   . PAF (paroxysmal atrial fibrillation) [facility])    Diagnosis July 2017 - spontaneously resolved  . Statin intolerance       Family History  Problem Relation Age of Onset  . COPD Mother   . Hearing loss Mother   . Stroke Mother   . Heart disease Mother        chf, died at 71  . Arthritis Father   . Hypertension Father   . Heart disease Father        cad, pvd, died at 78  . Heart disease Sister        heart valve  . Arthritis Sister   . Hyperlipidemia Brother   . Hypertension Brother   . 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 Smoking status: Former Smoker    Packs/day: 0.00    Types: Cigarettes    Start date: 11/12/1965    Quit date: 09/06/2008    Years since quitting: 11.2  . Smokeless tobacco: Never Used  Substance Use Topics  . Alcohol use: Yes    Alcohol/week: 1.0 standard drinks    Types:  1 Glasses of wine per week    Comment: occ     Current Meds  Medication Sig  . Biotin 10000 MCG TABS Take 10,000 mcg by mouth daily.   Marland Kitchen buPROPion (WELLBUTRIN SR) 150 MG 12 hr tablet Take 1 tablet (150 mg total) by mouth 2 (two) times daily.  . Cholecalciferol (VITAMIN D3) 125 MCG (5000 UT) TABS Take 15,000 Units by mouth daily.   . Coenzyme Q10 100 MG capsule Take 100 mg by mouth daily.   . Cyanocobalamin (VITAMIN B-12) 2500 MCG SUBL Place 2,500 mcg under the tongue daily.  Marland Kitchen docusate sodium (COLACE) 100 MG capsule Take 100 mg by mouth 2 (two) times daily.   Marland Kitchen estradiol (ESTRACE) 2 MG tablet Take 1 tablet (2 mg total) by mouth daily.  . nitroGLYCERIN (NITROSTAT) 0.4 MG SL tablet Place 1 tablet (0.4 mg total) under the tongue every 5 (five) minutes as needed for chest pain.  . NP THYROID 120 MG tablet Take 1 tablet by mouth  once daily  . oxyCODONE-acetaminophen (PERCOCET/ROXICET) 5-325 MG tablet TAKE 1 TABLET DAILY AS NEEDED FOR SEVERE PAIN.  Marland Kitchen progesterone (PROMETRIUM) 200 MG capsule Take 1 capsule (200 mg total) by mouth daily.  . Testosterone 20 % CREA Apply 1 application topically daily.   Marland Kitchen warfarin (COUMADIN) 5 MG tablet Take 2 tablets by mouth daily or as directed.    ROS:  Review of Systems  Constitutional: Negative for fever.  HENT: Positive for congestion.   Respiratory: Positive for cough and sputum production. Negative for shortness of breath.   Cardiovascular: Negative for chest pain.     Objective:   Today's Vitals: BP 130/80   Pulse (!) 55   Resp 15   Ht 5\' 4"  (1.626 m)   Wt 155 lb (70.3 kg)   LMP 07/08/1979 (Approximate)   BMI 26.61 kg/m  Vitals with BMI 12/14/2019 12/10/2019 12/01/2019  Height 5\' 4"  - -  Weight 155 lbs - -  BMI [id number] - -  Systolic [id number]  Diastolic 80 69 [id number]  Pulse 55 55 65     Physical Exam Comprehensive physical exam not completed today as office visit was conducted remotely.  On video she did appear well, she answered questions appropriately.  I did not note any cough while she was on the video.  She was able to talk in full sentences and did not seem short of breath.  She is alert and oriented x3.  She appeared to have appropriate judgment.      Assessment   No diagnosis found.    Tests ordered No orders of the defined types were placed in this encounter.    Plan: Please see assessment and plan per problem list below.   No orders of the defined types were placed in this encounter.   Patient to follow-up in 2 months as scheduled.  Ailene Ards, NP

## 2019-12-14 NOTE — Assessment & Plan Note (Signed)
I will prescribe her a Z-Pak to see if this improves her symptoms.  I offered to also prescribe a steroid, but she would like to avoid taking a steroid if possible.  I did discuss that azithromycin can interact with warfarin and increase her INR.  She is routinely followed at the Coumadin clinic for this.  She does have an at home INR monitoring device.  I recommended that she check her INR periodically while she is taking her azithromycin and if she notes that it is above goal that she should call our Coumadin clinic.  She tells me she understands.  I encouraged her to continue using over-the-counter medications such as NyQuil and Sudafed.  I also encouraged her to try Flonase nasal spray in addition to her NyQuil and Sudafed.  If symptoms persist despite Z-Pak will need to consider obtaining a chest x-ray.  She tells me she understands.  She will follow-up as scheduled or sooner if needed.

## 2019-12-21 ENCOUNTER — Telehealth (INDEPENDENT_AMBULATORY_CARE_PROVIDER_SITE_OTHER): Payer: Medicare Other | Admitting: Internal Medicine

## 2019-12-21 ENCOUNTER — Encounter (INDEPENDENT_AMBULATORY_CARE_PROVIDER_SITE_OTHER): Payer: Self-pay | Admitting: Internal Medicine

## 2019-12-21 DIAGNOSIS — R0602 Shortness of breath: Secondary | ICD-10-CM | POA: Diagnosis not present

## 2019-12-21 DIAGNOSIS — Z8616 Personal history of COVID-19: Secondary | ICD-10-CM

## 2019-12-21 DIAGNOSIS — R05 Cough: Secondary | ICD-10-CM

## 2019-12-21 DIAGNOSIS — R059 Cough, unspecified: Secondary | ICD-10-CM

## 2019-12-21 NOTE — Progress Notes (Signed)
Metrics: Intervention Frequency ACO  Documented Smoking Status Yearly  Screened one or more times in 24 months  Cessation Counseling or  Active cessation medication Past 24 months  Past 24 months   Guideline developer: UpToDate (See UpToDate for funding source) Date Released: 2014       Wellness Office Visit  Subjective:  Patient ID: Heather [name], female    DOB: 07-16-1946  Age: 74 y.o. MRN: [id number]  CC: This is an audio telemedicine visit with the patient's permission who is at home.  Unfortunately, we could not connect by video today.  I was able to easily recognize her voice. Her main complaint is dyspnea and fatigue. HPI  She was diagnosed with COVID-19 disease just over 3 weeks ago.  Thankfully, she has not had to be hospitalized but she did receive antibiotic treatment.  She still feels very fatigued and dyspneic on exertion.  She says that her oxygen saturation is 98% at rest on room air.  She does have pulse oximetry at home. Past Medical History:  Diagnosis Date  . Adrenal adenoma   . Allergy   . Anemia   . Anxiety   . CAD (coronary artery disease)    50-60% mid LAD at cardiac catheterization 2014 [facility])  . Cataract   . Depression   . DJD (degenerative joint disease) of cervical spine 09/12/2016  . History of cardiomyopathy   . History of tobacco use   . Hyperlipidemia   . Neuromuscular disorder ([place])   . PAF (paroxysmal atrial fibrillation) [facility])    Diagnosis July 2017 - spontaneously resolved  . Statin intolerance       Family History  Problem Relation Age of Onset  . COPD Mother   . Hearing loss Mother   . Stroke Mother   . Heart disease Mother        chf, died at 37  . Arthritis Father   . Hypertension Father   . Heart disease Father        cad, pvd, died at 65  . Heart disease Sister        heart valve  . Arthritis Sister   . Hyperlipidemia Brother   . Hypertension Brother   . 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 Smoking status: Former Smoker    Packs/day: 0.00    Types: Cigarettes    Start date: 11/12/1965    Quit date: 09/06/2008    Years since quitting: 11.2  . Smokeless tobacco: Never Used  Substance Use Topics  . Alcohol use: Yes    Alcohol/week: 1.0 standard drinks    Types: 1 Glasses of wine per week    Comment: occ    Current Meds  Medication Sig  . azithromycin (ZITHROMAX) 250 MG tablet Take 2 tablets by mouth once on day 1, then take 1 tablet by mouth every 24 hours for an additional 4 days.  . Biotin 10000 MCG TABS Take 10,000 mcg by mouth daily.   Marland Kitchen buPROPion (WELLBUTRIN SR) 150 MG 12 hr tablet Take 1 tablet (150 mg total) by mouth 2 (two) times daily.  . Cholecalciferol (VITAMIN D3) 125 MCG (5000 UT) TABS Take 15,000 Units by mouth daily.   . Coenzyme Q10 100 MG capsule Take 100 mg by mouth daily.   Marland Kitchen  Cyanocobalamin (VITAMIN B-12) 2500 MCG SUBL Place 2,500 mcg under the tongue daily.  Marland Kitchen docusate sodium (COLACE) 100 MG capsule Take 100 mg by mouth 2 (two) times daily.   Marland Kitchen estradiol (ESTRACE) 2 MG tablet Take 1 tablet (2 mg total) by mouth daily.  . nitroGLYCERIN (NITROSTAT) 0.4 MG SL tablet Place 1 tablet (0.4 mg total) under the tongue every 5 (five) minutes as needed for chest pain.  Marland Kitchen oxyCODONE-acetaminophen (PERCOCET/ROXICET) 5-325 MG tablet TAKE 1 TABLET DAILY AS NEEDED FOR SEVERE PAIN.  Marland Kitchen progesterone (PROMETRIUM) 200 MG capsule Take 1 capsule (200 mg total) by mouth daily.  . Testosterone 20 % CREA Apply 1 application topically daily.   Marland Kitchen warfarin (COUMADIN) 5 MG tablet Take 2 tablets by mouth daily or as directed.       Objective:   Today's Vitals: LMP 07/08/1979 (Approximate)  Vitals with BMI 12/21/2019 12/14/2019 12/10/2019  Height (No Data) 5\' 4"  -  Weight (No Data) 155 lbs -  BMI - [id number] -  Systolic (No Data) [id number]    Diastolic (No Data) 80 69  Pulse - 55 55     Physical Exam   Her voice is normal on the phone and she appears to be alert and orientated without any dyspnea.    Assessment   1. Cough   2. Shortness of breath   3. History of COVID-19       Tests ordered Orders Placed This Encounter  Procedures  . DG Chest 2 View     Plan: 1. I will send her for chest x-ray now to make sure there are no other superimposed pneumonia that she is now experiencing.  She was treated by Judson Roch with Zithromax recently which is seem to help to some degree. 2. This phone call lasted 15 minutes.   No orders of the defined types were placed in this encounter.   Doree Albee, MD

## 2019-12-23 ENCOUNTER — Ambulatory Visit (HOSPITAL_COMMUNITY)
Admission: RE | Admit: 2019-12-23 | Discharge: 2019-12-23 | Disposition: A | Discharge status: Home / Self Care | Payer: Medicare Other | Source: Ambulatory Visit | Attending: Internal Medicine | Admitting: Internal Medicine

## 2019-12-23 ENCOUNTER — Other Ambulatory Visit: Payer: Self-pay

## 2019-12-23 DIAGNOSIS — R05 Cough: Secondary | ICD-10-CM | POA: Diagnosis not present

## 2019-12-23 DIAGNOSIS — E785 Hyperlipidemia, unspecified: Secondary | ICD-10-CM | POA: Diagnosis not present

## 2019-12-23 DIAGNOSIS — I2510 Atherosclerotic heart disease of native coronary artery without angina pectoris: Secondary | ICD-10-CM | POA: Diagnosis not present

## 2019-12-23 DIAGNOSIS — R6882 Decreased libido: Secondary | ICD-10-CM | POA: Diagnosis not present

## 2019-12-23 DIAGNOSIS — E559 Vitamin D deficiency, unspecified: Secondary | ICD-10-CM | POA: Diagnosis not present

## 2019-12-23 DIAGNOSIS — R059 Cough, unspecified: Secondary | ICD-10-CM

## 2019-12-23 DIAGNOSIS — R0602 Shortness of breath: Secondary | ICD-10-CM | POA: Diagnosis not present

## 2019-12-23 IMAGING — DX DG CHEST 2V
2 series · 2 of 2 positions shown · non-contrast
Comparison: None

CLINICAL DATA: Diagnosed with [VX] 3 weeks ago, still with
fatigue, shortness of breath with exertion, heart palpitations,
history hypertension, diabetes mellitus, CHF, coronary artery
disease

EXAM:
CHEST - 2 VIEW

[chest pa]
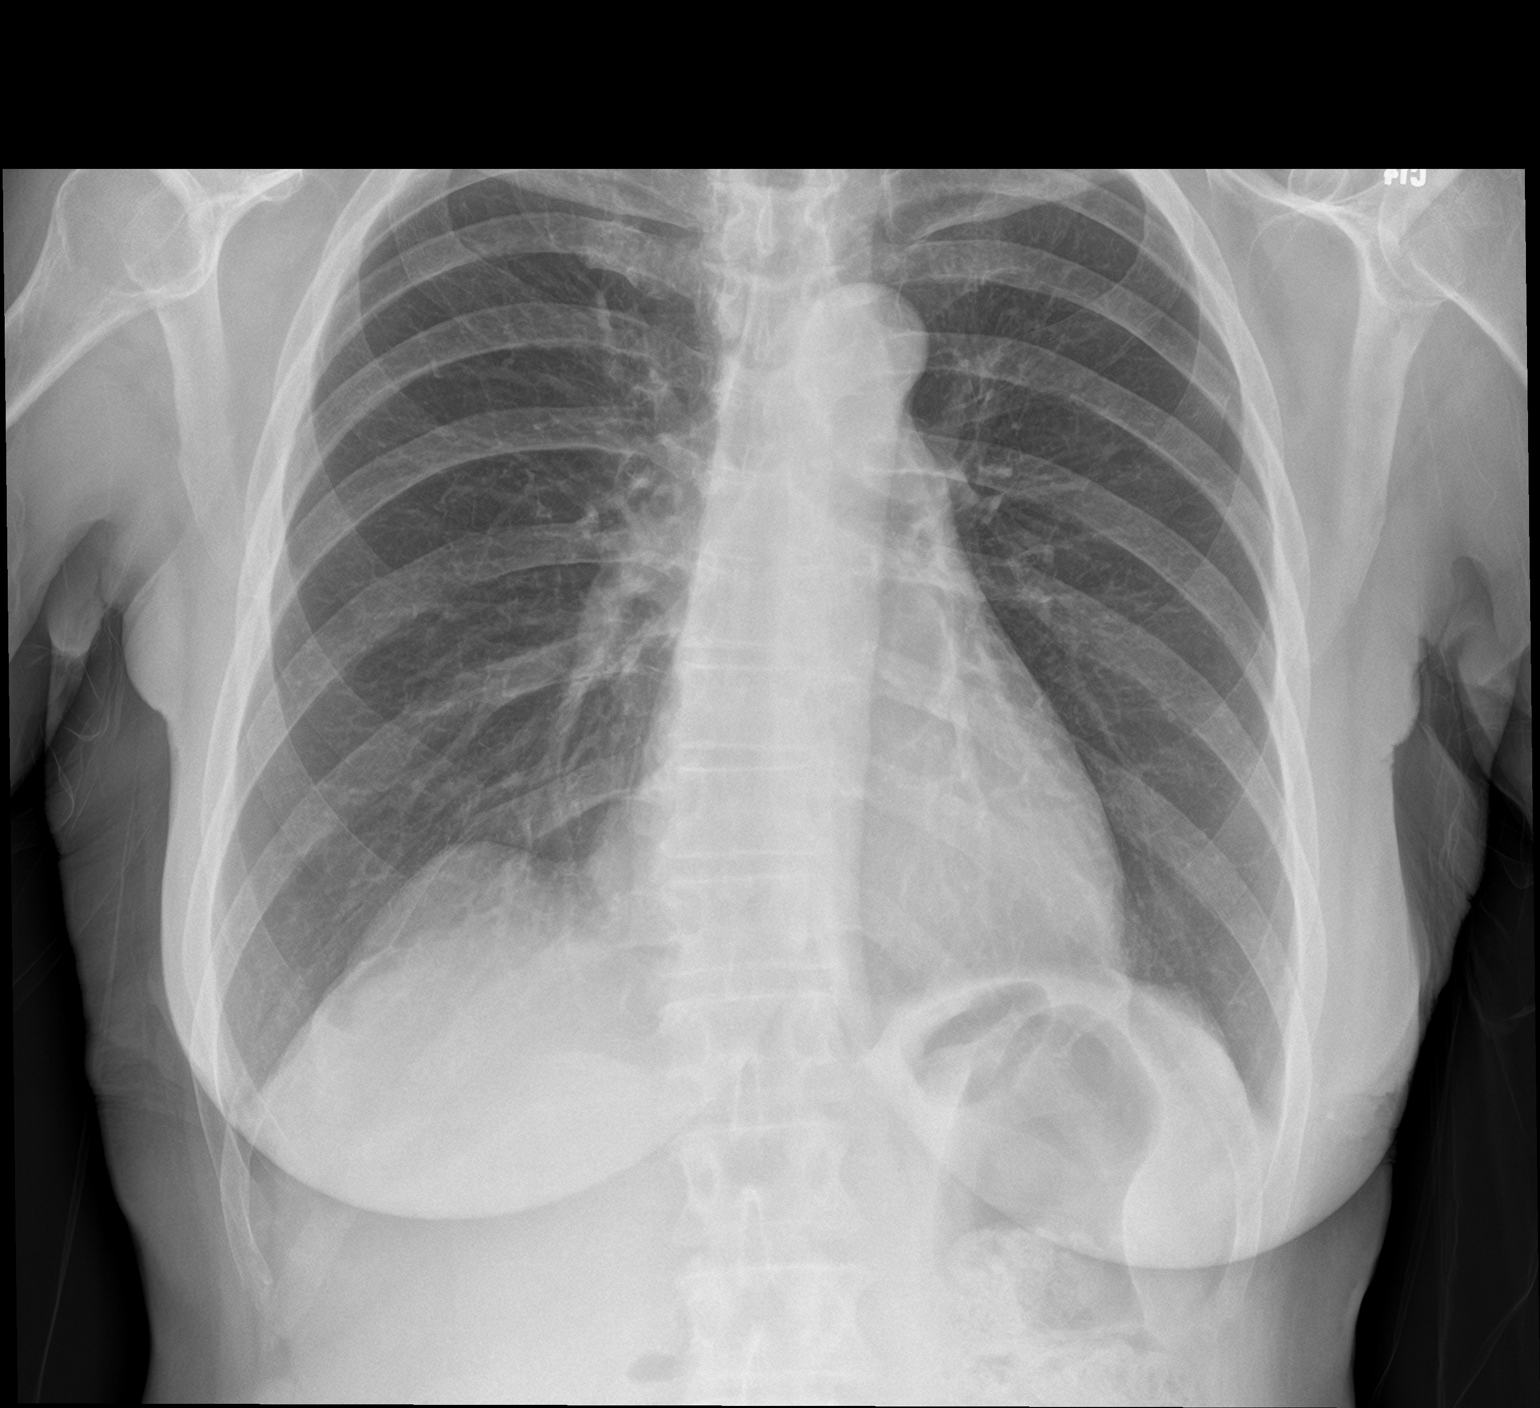

[chest lat]
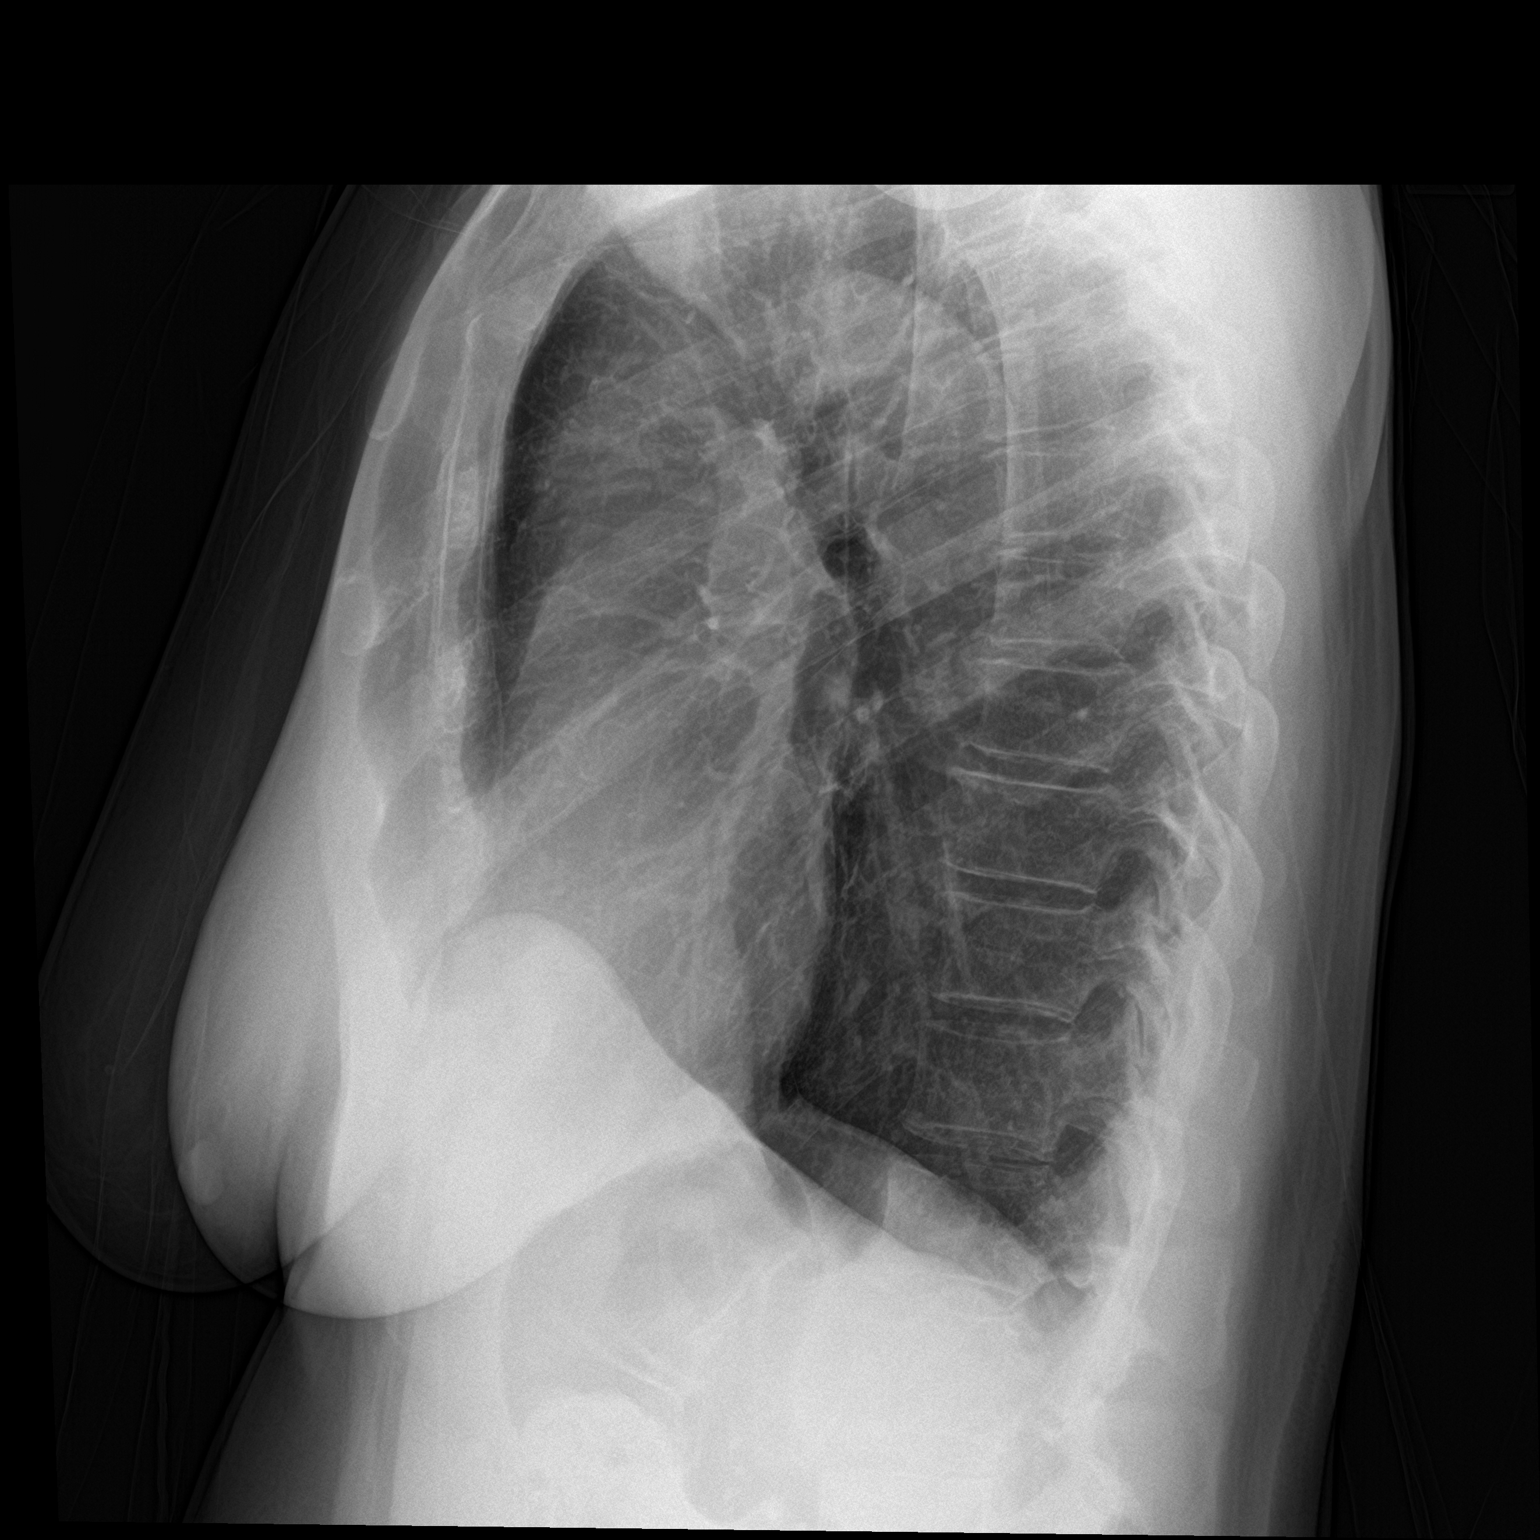

[2 of 2 positions shown; findings below may reference images not displayed]

FINDINGS: Normal heart size, mediastinal contours, and pulmonary vascularity.

Eventration anterior RIGHT diaphragm.

Lungs clear.

No infiltrate, pleural effusion or pneumothorax.

Bones unremarkable.
IMPRESSION: No acute abnormalities.

## 2019-12-26 DIAGNOSIS — H353132 Nonexudative age-related macular degeneration, bilateral, intermediate dry stage: Secondary | ICD-10-CM | POA: Diagnosis not present

## 2019-12-26 LAB — LIPID PANEL
Cholesterol: 301 mg/dL — ABNORMAL HIGH (ref <200)
HDL: 58 mg/dL (ref >=50)
LDL Cholesterol (Calc): 207 mg/dL (calc) — ABNORMAL HIGH
Non-HDL Cholesterol (Calc): 243 mg/dL (calc) — ABNORMAL HIGH (ref <130)
Total CHOL/HDL Ratio: 5.2 (calc) — ABNORMAL HIGH (ref <5.0)
Triglycerides: 185 mg/dL — ABNORMAL HIGH (ref <150)

## 2019-12-26 LAB — TESTOS,TOTAL,FREE AND SHBG (FEMALE)
Free Testosterone: 60.9 pg/mL — ABNORMAL HIGH (ref 0.2–3.7)
Sex Hormone Binding: 134 nmol/L — ABNORMAL HIGH (ref 14–73)
Testosterone, Total, LC-MS-MS: 870 ng/dL — ABNORMAL HIGH (ref 2–45)

## 2019-12-26 LAB — COMPLETE METABOLIC PANEL WITH GFR
AG Ratio: 1.6 (calc) (ref 1.0–2.5)
ALT: 11 U/L (ref 6–29)
AST: 16 U/L (ref 10–35)
Albumin: 4.1 g/dL (ref 3.6–5.1)
Alkaline phosphatase (APISO): 49 U/L (ref 37–153)
BUN: 13 mg/dL (ref 7–25)
CO2: 29 mmol/L (ref 20–32)
Calcium: 9.6 mg/dL (ref 8.6–10.4)
Chloride: 98 mmol/L (ref 98–110)
Creat: 0.76 mg/dL (ref 0.60–0.93)
GFR, Est African American: 90 mL/min/{1.73_m2} (ref >=60)
GFR, Est Non African American: 78 mL/min/{1.73_m2} (ref >=60)
Globulin: 2.5 g/dL (calc) (ref 1.9–3.7)
Glucose, Bld: 79 mg/dL (ref 65–99)
Potassium: 4.9 mmol/L (ref 3.5–5.3)
Sodium: 134 mmol/L — ABNORMAL LOW (ref 135–146)
Total Bilirubin: 0.4 mg/dL (ref 0.2–1.2)
Total Protein: 6.6 g/dL (ref 6.1–8.1)

## 2019-12-26 LAB — VITAMIN D 25 HYDROXY (VIT D DEFICIENCY, FRACTURES): Vit D, 25-Hydroxy: 100 ng/mL (ref 30–100)

## 2019-12-26 LAB — TSH: TSH: 0.08 mIU/L — ABNORMAL LOW (ref 0.40–4.50)

## 2019-12-26 LAB — T3, FREE: T3, Free: 3.9 pg/mL (ref 2.3–4.2)

## 2019-12-29 ENCOUNTER — Telehealth (INDEPENDENT_AMBULATORY_CARE_PROVIDER_SITE_OTHER): Payer: Self-pay

## 2019-12-29 NOTE — Telephone Encounter (Signed)
I do not see where Dr. Anastasio Champion has made any notes regarding her lab results. I looked at them briefly, and would ask you to call the patient back and let her know to continue her medications as prescribed for now. Her cholesterol was elevated, but she can discuss this in further detail when she sees Dr. Anastasio Champion again in April. Thank you.

## 2019-12-29 NOTE — Telephone Encounter (Signed)
f/u on lab resutls that dr Anastasio Champion tried to send her. Been without power, and internet services. When we get a chance to send her a mychart message or call back labs.

## 2019-12-30 NOTE — Telephone Encounter (Signed)
She will discuss when Dr Anastasio Champion comes back. No rush she is aware for the time out of work.

## 2019-12-31 ENCOUNTER — Other Ambulatory Visit (INDEPENDENT_AMBULATORY_CARE_PROVIDER_SITE_OTHER): Payer: Self-pay | Admitting: Internal Medicine

## 2019-12-31 MED ORDER — THYROID 30 MG PO TABS: 30.0000 mg | ORAL_TABLET | Freq: Every day | ORAL | 3 refills | Status: DC

## 2020-01-10 ENCOUNTER — Other Ambulatory Visit (INDEPENDENT_AMBULATORY_CARE_PROVIDER_SITE_OTHER): Payer: Self-pay | Admitting: Internal Medicine

## 2020-01-20 ENCOUNTER — Other Ambulatory Visit: Payer: Self-pay | Admitting: Cardiology

## 2020-01-22 ENCOUNTER — Other Ambulatory Visit (INDEPENDENT_AMBULATORY_CARE_PROVIDER_SITE_OTHER): Payer: Self-pay | Admitting: Internal Medicine

## 2020-01-25 ENCOUNTER — Other Ambulatory Visit: Payer: Self-pay | Admitting: Neurology

## 2020-01-25 DIAGNOSIS — H353132 Nonexudative age-related macular degeneration, bilateral, intermediate dry stage: Secondary | ICD-10-CM | POA: Diagnosis not present

## 2020-01-25 MED ORDER — OXYCODONE-ACETAMINOPHEN 5-325 MG PO TABS: ORAL_TABLET | ORAL | 0 refills | Status: DC

## 2020-01-25 NOTE — Addendum Note (Signed)
Addended by: Wyvonnia Lora on: 01/25/2020 12:08 PM   Modules accepted: Orders

## 2020-01-25 NOTE — Telephone Encounter (Signed)
Pt is asking for a refill on her oxyCODONE-acetaminophen (PERCOCET/ROXICET) 5-325 MG tablet [place]

## 2020-01-29 ENCOUNTER — Other Ambulatory Visit (INDEPENDENT_AMBULATORY_CARE_PROVIDER_SITE_OTHER): Payer: Self-pay | Admitting: Internal Medicine

## 2020-02-05 ENCOUNTER — Encounter (INDEPENDENT_AMBULATORY_CARE_PROVIDER_SITE_OTHER): Payer: Self-pay | Admitting: Internal Medicine

## 2020-02-08 ENCOUNTER — Other Ambulatory Visit (INDEPENDENT_AMBULATORY_CARE_PROVIDER_SITE_OTHER): Payer: Self-pay

## 2020-02-08 DIAGNOSIS — M7918 Myalgia, other site: Secondary | ICD-10-CM

## 2020-02-08 NOTE — Telephone Encounter (Signed)
Referral sent 

## 2020-02-08 NOTE — Telephone Encounter (Signed)
Please read

## 2020-02-18 ENCOUNTER — Other Ambulatory Visit (INDEPENDENT_AMBULATORY_CARE_PROVIDER_SITE_OTHER): Payer: Self-pay | Admitting: Internal Medicine

## 2020-02-22 ENCOUNTER — Ambulatory Visit: Payer: Medicare Other | Admitting: Neurology

## 2020-02-22 ENCOUNTER — Other Ambulatory Visit: Payer: Self-pay | Admitting: *Deleted

## 2020-02-22 MED ORDER — OXYCODONE-ACETAMINOPHEN 5-325 MG PO TABS: ORAL_TABLET | ORAL | 0 refills | Status: DC

## 2020-02-22 NOTE — Telephone Encounter (Signed)
Patient was on the schedule today for Botox but unfortunately we had to reschedule because 12 weeks had not passed since her previous appointment.  She states she has really been having a lot of problems and is wondering if she could get a prescription for Percocet?  Please call.

## 2020-02-24 DIAGNOSIS — H353132 Nonexudative age-related macular degeneration, bilateral, intermediate dry stage: Secondary | ICD-10-CM | POA: Diagnosis not present

## 2020-02-25 ENCOUNTER — Other Ambulatory Visit: Payer: Self-pay

## 2020-02-25 ENCOUNTER — Encounter (INDEPENDENT_AMBULATORY_CARE_PROVIDER_SITE_OTHER): Payer: Self-pay | Admitting: Internal Medicine

## 2020-02-25 ENCOUNTER — Ambulatory Visit (HOSPITAL_COMMUNITY): Payer: Medicare Other | Admitting: Physical Therapy

## 2020-02-25 ENCOUNTER — Ambulatory Visit (INDEPENDENT_AMBULATORY_CARE_PROVIDER_SITE_OTHER): Payer: Medicare Other | Admitting: Internal Medicine

## 2020-02-25 VITALS — BP 138/70 | HR 59 | Temp 97.0°F | Resp 18 | Ht 65.0 in | Wt 163.4 lb

## 2020-02-25 DIAGNOSIS — E785 Hyperlipidemia, unspecified: Secondary | ICD-10-CM

## 2020-02-25 DIAGNOSIS — I2510 Atherosclerotic heart disease of native coronary artery without angina pectoris: Secondary | ICD-10-CM | POA: Diagnosis not present

## 2020-02-25 DIAGNOSIS — R5383 Other fatigue: Secondary | ICD-10-CM | POA: Diagnosis not present

## 2020-02-25 DIAGNOSIS — E2839 Other primary ovarian failure: Secondary | ICD-10-CM | POA: Diagnosis not present

## 2020-02-25 DIAGNOSIS — Z23 Encounter for immunization: Secondary | ICD-10-CM | POA: Diagnosis not present

## 2020-02-25 DIAGNOSIS — R5381 Other malaise: Secondary | ICD-10-CM | POA: Diagnosis not present

## 2020-02-25 NOTE — Progress Notes (Signed)
Metrics: Intervention Frequency ACO  Documented Smoking Status Yearly  Screened one or more times in 24 months  Cessation Counseling or  Active cessation medication Past 24 months  Past 24 months   Guideline developer: UpToDate (See UpToDate for funding source) Date Released: 2014       Wellness Office Visit  Subjective:  Patient ID: Heather [name], female    DOB: 31-Jul-1946  Age: 74 y.o. MRN: [id number]  CC: This lady comes in for follow-up of hyperlipidemia, symptoms of thyroid deficiency, postmenopausal symptoms. HPI  On the last visit, I increased her desiccated NP thyroid and she has tolerated the dose.  She is now taking NP thyroid 120 mg in the morning and NP thyroid 30 mg at lunchtime. She continues on bioidentical hormone therapy with estradiol, progesterone and testosterone cream.  The testosterone cream she takes maybe once or twice a week because her levels were very high.  She still finds it beneficial. Past Medical History:  Diagnosis Date  . Adrenal adenoma   . Allergy   . Anemia   . Anxiety   . CAD (coronary artery disease)    50-60% mid LAD at cardiac catheterization 2014 [facility])  . Cataract   . Depression   . DJD (degenerative joint disease) of cervical spine 09/12/2016  . History of cardiomyopathy   . History of tobacco use   . Hyperlipidemia   . Neuromuscular disorder ([place])   . PAF (paroxysmal atrial fibrillation) [facility])    Diagnosis July 2017 - spontaneously resolved  . Statin intolerance       Family History  Problem Relation Age of Onset  . COPD Mother   . Hearing loss Mother   . Stroke Mother   . Heart disease Mother        chf, died at 68  . Arthritis Father   . Hypertension Father   . Heart disease Father        cad, pvd, died at 8  . Heart disease Sister        heart valve  . Arthritis Sister   . Hyperlipidemia Brother   . Hypertension Brother   . 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 Smoking status: Former Smoker    Packs/day: 0.00    Types: Cigarettes    Start date: 11/12/1965    Quit date: 09/06/2008    Years since quitting: 11.4  . Smokeless tobacco: Never Used  Substance Use Topics  . Alcohol use: Yes    Alcohol/week: 1.0 standard drinks    Types: 1 Glasses of wine per week    Comment: occ    Current Meds  Medication Sig  . azithromycin (ZITHROMAX) 250 MG tablet Take 2 tablets by mouth once on day 1, then take 1 tablet by mouth every 24 hours for an additional 4 days.  . Biotin 10000 MCG TABS Take 10,000 mcg by mouth daily.   Marland Kitchen buPROPion (WELLBUTRIN SR) 150 MG 12 hr tablet Take 1 tablet by mouth twice daily.  . Cholecalciferol (VITAMIN D3) 125 MCG (5000 UT) TABS Take 15,000 Units by mouth daily.   . Coenzyme Q10 100 MG capsule Take 100 mg by mouth daily.   . Cyanocobalamin (VITAMIN B-12) 2500 MCG SUBL Place 2,500 mcg under the tongue daily.  Marland Kitchen  docusate sodium (COLACE) 100 MG capsule Take 100 mg by mouth 2 (two) times daily.   Marland Kitchen estradiol (ESTRACE) 2 MG tablet Take 1 tablet by mouth daily  . NP THYROID 120 MG tablet TAKE 1 TABLET BY MOUTH EVERY DAY  . progesterone (PROMETRIUM) 200 MG capsule Take 1 capsule (200 mg total) by mouth daily.  . Testosterone 20 % CREA Apply 1 application topically daily.   Marland Kitchen thyroid (NP THYROID) 30 MG tablet Take 1 tablet (30 mg total) by mouth daily before breakfast.  . warfarin (COUMADIN) 5 MG tablet Take 2 tablets by mouth daily or as directed.  . [DISCONTINUED] oxyCODONE-acetaminophen (PERCOCET/ROXICET) 5-325 MG tablet TAKE 1 TABLET DAILY AS NEEDED FOR SEVERE PAIN.      Objective:   Today's Vitals: BP 138/70 (BP Location: Left Arm, Patient Position: Sitting, Cuff Size: Normal)   Pulse (!) 59   Temp (!) 97 F (36.1 C) (Temporal)   Resp 18   Ht 5\' 5"  (1.651 m)   Wt 163 lb 6.4 oz (74.1 kg)   LMP  07/08/1979 (Approximate)   SpO2 98%   BMI 27.19 kg/m  Vitals with BMI 02/25/2020 12/21/2019 12/14/2019  Height 5\' 5"  (No Data) 5\' 4"   Weight 163 lbs 6 oz (No Data) 155 lbs  BMI 0000000 - [id number]  Systolic 0000000 (No Data) [id number]  Diastolic 70 (No Data) 80  Pulse 59 - 55     Physical Exam    She looks systemically well.  She has gained about 8 pounds since her last visit.  Blood pressure is very stable.  Alert and orientated without any focal neurological signs.   Assessment   1. Hyperlipidemia, unspecified hyperlipidemia type   2. Malaise and fatigue   3. Primary ovarian failure       Tests ordered Orders Placed This Encounter  Procedures  . T3, free  . Lipid panel  . Estradiol  . Progesterone     Plan: 1. Blood work is ordered. 2. She will continue to work on nutrition for her significant hyperlipidemia.  She has not tolerated statin therapy and she really does not want to go on to statin therapy. 3. She will continue with desiccated NP thyroid for symptoms of thyroid deficiency.  NP thyroid is being used off label.  We will see if we need to adjust the dose further. 4. She will continue with bioidentical hormone therapy and I will check estradiol and progesterone levels today. 5. Further recommendations will depend on blood results and I will see her in 3 months time for follow-up.   No orders of the defined types were placed in this encounter.   Doree Albee, MD

## 2020-02-29 ENCOUNTER — Other Ambulatory Visit (INDEPENDENT_AMBULATORY_CARE_PROVIDER_SITE_OTHER): Payer: Self-pay | Admitting: Internal Medicine

## 2020-02-29 MED ORDER — BUPROPION HCL ER (SR) 150 MG PO TB12: 150.0000 mg | ORAL_TABLET | Freq: Two times a day (BID) | ORAL | 0 refills | Status: DC

## 2020-03-01 ENCOUNTER — Ambulatory Visit (INDEPENDENT_AMBULATORY_CARE_PROVIDER_SITE_OTHER): Payer: Medicare Other | Admitting: Internal Medicine

## 2020-03-07 ENCOUNTER — Other Ambulatory Visit: Payer: Self-pay

## 2020-03-07 ENCOUNTER — Encounter (HOSPITAL_COMMUNITY): Payer: Self-pay | Admitting: Physical Therapy

## 2020-03-07 ENCOUNTER — Ambulatory Visit (HOSPITAL_COMMUNITY): Payer: Medicare Other | Attending: Internal Medicine | Admitting: Physical Therapy

## 2020-03-07 ENCOUNTER — Ambulatory Visit (HOSPITAL_COMMUNITY): Payer: Medicare Other | Admitting: Physical Therapy

## 2020-03-07 DIAGNOSIS — R293 Abnormal posture: Secondary | ICD-10-CM

## 2020-03-07 DIAGNOSIS — M6281 Muscle weakness (generalized): Secondary | ICD-10-CM | POA: Diagnosis not present

## 2020-03-07 DIAGNOSIS — M546 Pain in thoracic spine: Secondary | ICD-10-CM

## 2020-03-07 NOTE — Therapy (Signed)
[place], [name], [address] Phone: [phone]   Fax:  [phone]  Physical Therapy Evaluation  Patient Details  Name: Heather [name] MRN: [id number] Date of Birth: 02-18-1946 Referring Provider (PT): Hurshel Party MD   Encounter Date: 03/07/2020  PT End of Session - 03/07/20 1624    Visit Number  1    Number of Visits  8    Date for PT Re-Evaluation  04/15/20    Authorization Type  Primary: medicare secondary generic commercial    Progress Note Due on Visit  8    PT Start Time  1520    PT Stop Time  1605    PT Time Calculation (min)  45 min    Activity Tolerance  Patient tolerated treatment well    Behavior During Therapy  [facility] for tasks assessed/performed       Past Medical History:  Diagnosis Date  . Adrenal adenoma   . Allergy   . Anemia   . Anxiety   . CAD (coronary artery disease)    50-60% mid LAD at cardiac catheterization 2014 [facility])  . Cataract   . Depression   . DJD (degenerative joint disease) of cervical spine 09/12/2016  . History of cardiomyopathy   . History of tobacco use   . Hyperlipidemia   . Neuromuscular disorder ([place])   . PAF (paroxysmal atrial fibrillation) [facility])    Diagnosis July 2017 - spontaneously resolved  . Statin intolerance     Past Surgical History:  Procedure Laterality Date  . ABDOMINAL HYSTERECTOMY  1980   endometriosis  . CATARACT EXTRACTION Left 11/2018  . LEFT HEART CATH AND CORONARY ANGIOGRAPHY N/A 09/25/2018   Procedure: LEFT HEART CATH AND CORONARY ANGIOGRAPHY;  Surgeon: Belva Crome, MD;  Location: [facility];  Service: Cardiovascular;  Laterality: N/A;  . TONSILLECTOMY  1954    There were no vitals filed for this visit.   Subjective Assessment - 03/07/20 1523    Subjective  Patient is a 74 y.o. female who presents to physical therapy with c/o myofascial pain syndrome. She has had injections, massage, TENS, pills, exercises and  tomorrow she gets another Botox injection in her back. Everything she tried has worked for a little bit. She has not found any correlation between symptoms starting and beginning. Symptoms start between 12 and 2 every day. Everything becomes painful. The only things that tends to help is when her husband holds pressure in the area. Pain increases with gardening, moving fingers. Her main goal is to eliminate her pain. Pain decreases for months at a time.  Symptoms began about 10 years ago.    Limitations  House hold activities;Other (comment);Writing   gardening   Patient Stated Goals  Eliminate Pain    Currently in Pain?  Yes    Pain Score  6     Pain Location  Back    Pain Orientation  Right    Pain Descriptors / Indicators  Stabbing    Pain Type  Chronic pain         [facility] PT Assessment - 03/07/20 0001      Assessment   Medical Diagnosis  Myofascial Pain Syndrome    Referring Provider (PT)  Hurshel Party MD    Onset Date/Surgical Date  03/07/10    Next MD Visit  few months    Prior Therapy  Yes      Precautions   Precautions  None  Restrictions   Weight Bearing Restrictions  No      Balance Screen   Has the patient fallen in the past 6 months  No    Has the patient had a decrease in activity level because of a fear of falling?   No    Is the patient reluctant to leave their home because of a fear of falling?   No      Prior Function   Level of Independence  Independent    Vocation  Full time employment    Network engineer    Leisure  gardening      Cognition   Overall Cognitive Status  Within Functional Limits for tasks assessed      Observation/Other Assessments   Observations  Patient ambulates without AD, sits with min impaired posture, constant changing positions, stands against wall for pressure on back      Posture/Postural Control   Posture/Postural Control  Postural limitations    Postural Limitations  Rounded Shoulders;Forward head       ROM / Strength   AROM / PROM / Strength  AROM;Strength      AROM   Overall AROM   Within functional limits for tasks performed    AROM Assessment Site  Thoracic      Strength   Strength Assessment Site  Hip;Knee;Ankle    Right/Left Hip  Right;Left    Right Hip Flexion  4+/5    Left Hip Flexion  4+/5    Right/Left Knee  Right;Left    Right Knee Flexion  4-/5    Right Knee Extension  5/5    Left Knee Flexion  4-/5    Left Knee Extension  5/5    Right/Left Ankle  Right;Left    Right Ankle Dorsiflexion  5/5    Left Ankle Dorsiflexion  5/5      Palpation   Spinal mobility  Grossly hypomobile thoracic and lumbar spine with CPA, R and L UPAs, Tenderness at T5-T6 with referred pain to R UT; R ribs minimally hypomobile on R, L WFL    Palpation comment  TTP R thoracic and lumbar paraspinals with tenderness T4-T9 greatest, R infraspinatus, UT, middle trap, lat, subscapularis; referred pain into RUE with subscap palpation                Objective measurements completed on examination: See above findings.              PT Education - 03/07/20 1623    Education Details  Patient educated on exam findings, POC, scope of PT, trigger points, referral patterns, posture, botox on muscles, MS, manual therapy, exercise, postural strengtheing, dry needling vs. accupuncture    Person(s) Educated  Patient    Methods  Explanation;Demonstration    Comprehension  Verbalized understanding;Returned demonstration       PT Short Term Goals - 03/07/20 1635      PT SHORT TERM GOAL #1   Title  Patient will be independent with HEP in order to improve functional outcomes.    Time  2    Period  Weeks    Status  New    Target Date  03/30/20      PT SHORT TERM GOAL #2   Title  Patient will report at least 25% improvement in symptoms for improved quality of life.    Time  2    Period  Weeks    Status  New    Target Date  03/30/20  PT Long Term Goals - 03/07/20 1637      PT  LONG TERM GOAL #1   Title  Patient will report at least 75% improvement in symptoms for improved quality of life.    Time  4    Period  Weeks    Status  New    Target Date  04/15/20      PT LONG TERM GOAL #2   Title  Patient will be able to garden and use the computer without pain greater than 3/10 for improved tolerance to activity.    Time  4    Period  Weeks    Status  New    Target Date  04/15/20      PT LONG TERM GOAL #3   Title  Patient will be able to decrease her symptoms with HEP in order manage symptoms independently.    Time  4    Period  Weeks    Status  New    Target Date  04/15/20             Plan - 03/07/20 1627    Clinical Impression Statement  Patient is a 74 y.o. female who presents to physical therapy with c/o myofascial pain syndrome which has been intermittent for the last 10 years. She is mostly pain limited with R sided thoracic pain. She presents with pain limited deficits in thoracic strength, ROM, trigger points, hyperactive musculature, muscular endurance, postural impairments, spinal mobility and functional mobility with ADL. She is having to modify and restrict ADL as indicated subjective information and objective measures which is affecting overall participation. Patient will benefit from skilled physical therapy in order to improve function and reduce impairment. Patient will see if injection tomorrow improves symptoms and will follow up in 2 weeks to begin with therapy sessions and assess if injection helps vs. PT.    Personal Factors and Comorbidities  Age;Behavior Pattern;Time since onset of injury/illness/exacerbation;Past/Current Experience;Profession    Examination-Activity Limitations  Bend;Sit    Examination-Participation Restrictions  Yard Work;Cleaning;Community Activity    Stability/Clinical Decision Making  Evolving/Moderate complexity    Clinical Decision Making  Moderate    Rehab Potential  Fair    PT Frequency  2x / week    PT  Duration  4 weeks    PT Treatment/Interventions  ADLs/Self Care Home Management;Aquatic Therapy;Biofeedback;Cryotherapy;Electrical Stimulation;Iontophoresis 4mg /ml Dexamethasone;Moist Heat;Traction;Ultrasound;DME Instruction;Gait training;Stair training;Functional mobility training;Therapeutic activities;Therapeutic exercise;Balance training;Orthotic Fit/Training;Patient/family education;Neuromuscular re-education;Manual techniques;Manual lymph drainage;Compression bandaging;Scar mobilization;Passive range of motion;Dry needling;Energy conservation;Splinting;Taping;Spinal Manipulations;Joint Manipulations    PT Next Visit Plan  Assess cervical spine, possibly begin manual therapy with STM and mobs, assess for trigger points, begin postural strengthening    PT Home Exercise Plan  initiate next session    Consulted and Agree with Plan of Care  Patient       Patient will benefit from skilled therapeutic intervention in order to improve the following deficits and impairments:  Decreased activity tolerance, Decreased mobility, Decreased strength, Decreased endurance, Decreased range of motion, Hypomobility, Increased muscle spasms, Increased fascial restricitons, Improper body mechanics, Postural dysfunction, Pain  Visit Diagnosis: Pain in thoracic spine  Abnormal posture  Muscle weakness (generalized)     Problem List Patient Active Problem List   Diagnosis Date Noted  . Cough 12/14/2019  . History of COVID-19 12/10/2019  . Abnormal myocardial perfusion study   . Muscle spasm 02/25/2018  . Tubular adenoma of colon 07/19/2017  . H. pylori infection 07/19/2017  . Thoracic back pain 02/08/2017  .  Hx of colonic polyps 12/03/2016  . Family history of colon cancer 12/03/2016  . Encounter for therapeutic drug monitoring 11/21/2016  . CAD (coronary artery disease) 09/12/2016  . PAF (paroxysmal atrial fibrillation) ([place]) 09/12/2016  . Syncope 09/12/2016  . DJD (degenerative joint disease) of  cervical spine 09/12/2016  . Statin intolerance 09/12/2016  . TIA (transient ischemic attack) 09/12/2016  . Diverticulitis of colon 09/12/2016  . Myofascial pain syndrome 09/06/2016  . Takotsubo syndrome 09/06/2016  . HLD (hyperlipidemia) 09/06/2016  . Depression 09/06/2016  . Recurrent UTI (urinary tract infection) 09/06/2016    4:45 PM, 03/07/20 Mearl Latin PT, DPT Physical Therapist at [facility] [place], [name], [address] Phone: [phone]   Fax:  [phone]  Name: Heather [name] MRN: [id number] Date of Birth: 09/02/1946

## 2020-03-08 ENCOUNTER — Ambulatory Visit (INDEPENDENT_AMBULATORY_CARE_PROVIDER_SITE_OTHER): Payer: Medicare Other | Admitting: Neurology

## 2020-03-08 ENCOUNTER — Encounter: Payer: Self-pay | Admitting: Neurology

## 2020-03-08 VITALS — BP 118/78 | HR 55 | Temp 97.3°F | Ht 65.0 in | Wt 161.5 lb

## 2020-03-08 DIAGNOSIS — M546 Pain in thoracic spine: Secondary | ICD-10-CM

## 2020-03-08 DIAGNOSIS — M7918 Myalgia, other site: Secondary | ICD-10-CM | POA: Diagnosis not present

## 2020-03-08 DIAGNOSIS — Z8616 Personal history of COVID-19: Secondary | ICD-10-CM

## 2020-03-08 DIAGNOSIS — G8929 Other chronic pain: Secondary | ICD-10-CM | POA: Diagnosis not present

## 2020-03-08 DIAGNOSIS — I2510 Atherosclerotic heart disease of native coronary artery without angina pectoris: Secondary | ICD-10-CM | POA: Diagnosis not present

## 2020-03-08 DIAGNOSIS — M62838 Other muscle spasm: Secondary | ICD-10-CM

## 2020-03-08 NOTE — Progress Notes (Signed)
[facility]  PATIENT: Heather [name] DOB: 08-28-1946  REFERRING DOCTOR OR PCP:  Blanchie Serve SOURCE: patient  _________________________________   HISTORICAL  CHIEF COMPLAINT:  Chief Complaint  Patient presents with  . Follow-up    RM 13, alone. Last appt 12/10/2019.  Marland Kitchen Botulinum Toxin Injection    100Ux1vial. LotYF:[id number]. Expiration: 09/2022. NDC: [id number]    HISTORY OF PRESENT ILLNESS:  Heather [name] is a 74 y.o. woman who has had chronic right-sided mid back pain since 2013.      Update 03/08/2020: The right flank pain is doing better with Botox injections.   Quality of pain is cramp-like.   It is present right of T6-t9 in the intracostal space.   She has not needed any oxycodone.  Pain fluctuates being more severe some days.   She is still feeling more fatigued since Covid 19 earlier this year.     She has chronic atrial fibrillation is on warfarin.  She has not had a stroke or TIA.   Update 12/10/2019: She reports pain in the right flank similar to pain she has experienced in the past..  She has had this for the past 4 years.  Pain is the right T6-T9 intracostal region.  The pain has a cramp-like quality.  She has been on Botox for the last 2+ years and the pain improves for the majority of the 72-month interval.  She feels it has helped the pain about 75%.  She tolerates the injections well.    She takes Percocet prn if pain is more severe.  She is otherwise doing well.  She was visiting a friend who had severe CHF and she and her husband both caught Covid-19.   She had fatigue and congestion but no other symptoms.   She still feels congested.  She has chronic atrial fibrillation is on warfarin.  She has not had a stroke or TIA.  Update 09/09/2019: She reports pain in the right flank.  She has had this for the past 3 to 4 years.  Pain is the right T6-T9 intracostal region.  The pain has a cramp-like quality.  She has been on Botox for the last 2 years  and the pain improves.  She feels it has helped the pain about 75%.  She tolerates the injections well.    She takes Percocet prn if pain is more severe.  She is otherwise doing well.  She also has chronic A. Fib and is on warfarin.  She has not had any TIA or stroke symptoms.   Update 06/04/2019: She has had pain in the right flank x several years.  Wprse pain is the right T6-T9 intracostal region.   She has done well with Botox and reports that pain has been reduced by about 75%.   For couple weeks she had almost no pain.  She tolerates the injections well.   Pain has a cramp-like quality.   She takes Percocet prn if pain is more severe.  She is otherwise doing well.  She also has chronic A. Fib and is on warfarin  Update 03/03/2019: She has had pain in the right flank x several years.  Wprse pain is the right T6-T9 intracostal region.   She has done well with Botox and reports that pain has been reduced by about 75%.    She tolerates the injections well.   Pain has a cramp-like quality.   She takes Percocet prn if pain is more severe.  She is otherwise  doing well.  She is on warfarin for chronic A. fib.  Update 11/28/2018: Her myofascial pain in the mid to lower right thoracic region has responded very well to the Botox therapy and is more than 75% better than it was initially.  Most of the pain is in the right intercostal region from T6-T9.  She has tolerated the Botox injections well.   When the pain is more severe, she will take a Percocet.  The quality of the pain is cramp-like.  Update 08/27/2018: She reports that the back pain is more than 50% better and the episodes of more significant pain have been significantly reduced.  She tolerated the injections without any problems.  Most of her pain is in the right intracoastal region from T6-T9.     When pain is more severe, she will take a Percocet.  She does not take more than 2 or 3 a week  She is on Warfarin for chronic Afib.     Update  05/22/2018: She is here today for her first Botox injections for myofascial pain in the right intracoastal region (T7-T9)  Update 02/25/2018: Her right sided thoracic pain flared up a week ago and was intense x 2-3 days.   Opioids make her feel sick. Diazepam 10 mg, Ibu and MJ helped her pain.    Currently pain is much better (improved over last 24 hours).   She also used the [employer] patch.     The first TPI we did helped her pain but the more recent one made pain worse.     She is wondering about Botox.    We discussed it in more detail.She  Is on coumadin for Afib.     From 05/01/2017: Her pain is about 1 inch right of midline in the lower thoracic paraspinal region. 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 Pain worsens often with household chores or gardening. The pain may improve with using a TENS unit, a trigger point or Percocet. Heating pad also sometimes helps. When pain is more severe she has smoked marijuana with some benefit. Cannabinol oil did not help. Flexeril did not help. More recently she took a tizanidine and was very sleepy though pain was better that day. More recently, she took a tizanidine 4 mg but fell asleep 1-2 hours.  The trigger point injection at the last visit (thoracic paraspinal) did not help.   She denies any numbness or weakness in her arms or legs.   No bowel or bladder changes.    She is on coumadin for recently diagnose atrial fibrillation.      She was getting trigger point injections every 3-6 months for most of the past 4 years but needed more frequently last year when she was packing up to move to this area.        I reviewed her MRI results from 10/14/2013. The MRI of the brain showed age related atrophy and minimal chronic microvascular ischemic change. MRI of the cervical spine showed multilevel mild degenerative changes with left paramedian disc herniation at C3-C4 and left disc protrusion at C4-C5 and C5-C6 and midline  disc herniation at C6-C7. There was no report of nerve root compression. MRI of the thoracic spine showed disc desiccation but no herniation or protrusions. MRI of the lumbar spine showed disc bulges at T12-L1 and L2-L3 and disc bulge with facet hypertrophy at L3-L4 and disc bulge with right foraminal annular tear at L4-L5.  REVIEW OF SYSTEMS: Constitutional: No fevers, chills, sweats, or change in appetite Eyes: No visual changes, double vision, eye pain Ear, nose and throat: No hearing loss, ear pain, nasal congestion, sore throat Cardiovascular: No chest pain, palpitations.   She has atrial fibrillation and is on warfarin Respiratory: No shortness of breath at rest or with exertion.   No wheezes GastrointestinaI: No nausea, vomiting, diarrhea, abdominal pain, fecal incontinence Genitourinary: No dysuria, urinary retention or frequency.  No nocturia. Musculoskeletal: No neck pain.  She has back pain/thoracic pain as above Integumentary: No rash, pruritus, skin lesions Neurological: as above Psychiatric: No depression at this time.  No anxiety Endocrine: No palpitations, diaphoresis, change in appetite, change in weigh or increased thirst Hematologic/Lymphatic: No anemia, purpura, petechiae. Allergic/Immunologic: No itchy/runny eyes, nasal congestion, recent allergic reactions, rashes  ALLERGIES: Allergies  Allergen Reactions  . Statins Other (See Comments)    Intolerant all statins  . Sulfa Antibiotics Rash    HOME MEDICATIONS:  Current Outpatient Medications:  .  Biotin 10000 MCG TABS, Take 10,000 mcg by mouth daily. , Disp: , Rfl:  .  buPROPion (WELLBUTRIN SR) 150 MG 12 hr tablet, Take 1 tablet (150 mg total) by mouth 2 (two) times daily., Disp: 180 tablet, Rfl: 0 .  Cholecalciferol (VITAMIN D3) 125 MCG (5000 UT) TABS, Take 15,000 Units by mouth daily. , Disp: , Rfl:  .  Coenzyme Q10 100 MG capsule, Take 100 mg by mouth daily. , Disp: , Rfl:  .  Cyanocobalamin (VITAMIN  B-12) 2500 MCG SUBL, Place 2,500 mcg under the tongue daily., Disp: , Rfl:  .  docusate sodium (COLACE) 100 MG capsule, Take 100 mg by mouth 2 (two) times daily. , Disp: , Rfl:  .  estradiol (ESTRACE) 2 MG tablet, Take 1 tablet by mouth daily, Disp: 30 tablet, Rfl: 4 .  NP THYROID 120 MG tablet, TAKE 1 TABLET BY MOUTH EVERY DAY, Disp: 60 tablet, Rfl: 3 .  progesterone (PROMETRIUM) 200 MG capsule, Take 1 capsule (200 mg total) by mouth daily., Disp: 90 capsule, Rfl: 1 .  Testosterone 20 % CREA, Apply 1 application topically daily. , Disp: , Rfl:  .  thyroid (NP THYROID) 30 MG tablet, Take 1 tablet (30 mg total) by mouth daily before breakfast., Disp: 30 tablet, Rfl: 3 .  warfarin (COUMADIN) 5 MG tablet, Take 2 tablets by mouth daily or as directed., Disp: 180 tablet, Rfl: 1 .  nitroGLYCERIN (NITROSTAT) 0.4 MG SL tablet, Place 1 tablet (0.4 mg total) under the tongue every 5 (five) minutes as needed for chest pain., Disp: 25 tablet, Rfl: 3  PAST MEDICAL HISTORY: Past Medical History:  Diagnosis Date  . Adrenal adenoma   . Allergy   . Anemia   . Anxiety   . CAD (coronary artery disease)    50-60% mid LAD at cardiac catheterization 2014 [facility])  . Cataract   . Depression   . DJD (degenerative joint disease) of cervical spine 09/12/2016  . History of cardiomyopathy   . History of tobacco use   . Hyperlipidemia   . Neuromuscular disorder ([place])   . PAF (paroxysmal atrial fibrillation) [facility])    Diagnosis July 2017 - spontaneously resolved  . Statin intolerance     PAST SURGICAL HISTORY: Past Surgical History:  Procedure Laterality Date  . ABDOMINAL HYSTERECTOMY  1980   endometriosis  . CATARACT EXTRACTION Left 11/2018  . LEFT HEART CATH AND CORONARY ANGIOGRAPHY N/A 09/25/2018   Procedure: LEFT HEART CATH AND CORONARY ANGIOGRAPHY;  Surgeon: Belva Crome, MD;  Location: [facility];  Service: Cardiovascular;  Laterality: N/A;  . TONSILLECTOMY  1954    FAMILY  HISTORY: Family History  Problem Relation Age of Onset  . COPD Mother   . Hearing loss Mother   . Stroke Mother   . Heart disease Mother        chf, died at 2  . Arthritis Father   . Hypertension Father   . Heart disease Father        cad, pvd, died at 98  . Heart disease Sister        heart valve  . Arthritis Sister   . Hyperlipidemia Brother   . Hypertension Brother   . Cancer - Prostate Brother     SOCIAL HISTORY:  Social History   Socioeconomic History  . Marital status: Married    Spouse name: Al  . Number of children: 0  . Years of education: 16  . Highest education level: Not on file  Occupational History  . Occupation: Probation officer    Comment: from home  Tobacco Use  . Smoking status: Former Smoker    Packs/day: 0.00    Types: Cigarettes    Start date: 11/12/1965    Quit date: 09/06/2008    Years since quitting: 11.5  . Smokeless tobacco: Never Used  Substance and Sexual Activity  . Alcohol use: Yes    Alcohol/week: 1.0 standard drinks    Types: 1 Glasses of wine per week    Comment: occ  . Drug use: Yes    Types: Marijuana    Comment: rarely  . Sexual activity: Yes    Birth control/protection: Post-menopausal  Other Topics Concern  . 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 Difficulty of Paying Living Expenses:   Food Insecurity:   . Worried About Charity fundraiser in the Last Year:   . Arboriculturist in the Last Year:   Transportation Needs:   . Film/video editor (Medical):   Marland Kitchen Lack of Transportation (Non-Medical):   Physical Activity:   . Days of Exercise per Week:   . Minutes of Exercise per Session:   Stress:   . Feeling of Stress :   Social Connections:   . Frequency of Communication with Friends and Family:   . Frequency of Social Gatherings with Friends and Family:   . Attends  Religious Services:   . Active Member of Clubs or Organizations:   . Attends Archivist Meetings:   Marland Kitchen Marital Status:   Intimate Partner Violence:   . Fear of Current or Ex-Partner:   . Emotionally Abused:   Marland Kitchen Physically Abused:   . Sexually Abused:      PHYSICAL EXAM  Vitals:   03/08/20 1317  BP: 118/78  Pulse: (!) 55  Temp: (!) 97.3 F (36.3 C)  SpO2: 96%  Weight: 161 lb 8 oz (73.3 kg)  Height: 5\' 5"  (1.651 m)    Body mass index is 26.88 kg/m.   General: The patient is well-developed and well-nourished and in no acute distress.  Breath sounds were normal.   Musculoskeletal: She has tenderness in the mid to lower thoracic intracoastal region from T6-T9 on the right.  There is no tenderness on the left or  in the neck or lower back.  Range of motion is normal in the spine.  Neurologic Exam  Mental status: The patient is alert and oriented x 3 at the time of the examination.  Focus and attention seem normal.   Speech is normal.  Cranial nerves: Extraocular muscles are intact.  Facial strength is normal.   . No obvious hearing deficits are noted.  Gait and station: Gait and station are normal.     ASSESSMENT AND PLAN  1. Myofascial pain syndrome   2. Chronic right-sided thoracic back pain   3. Muscle spasm   4. History of COVID-19      1.  Inject 100 units Botox split into tender points below the T6-T9 ribs into the intercostal space.  There were no complications and she tolerated the injections well.    2.   Stay active.  Exercise as tolerated.   3.   She will return in 3-4 months or sooner if  new or worsening neurologic symptoms.   Michell Kader A. Felecia Shelling, MD, PhD [id number], [id number] PM Certified in Neurology, Clinical Neurophysiology, Sleep Medicine, Pain Medicine and Neuroimaging  [facility] [address], [place], [place] [address] [phone]

## 2020-03-18 DIAGNOSIS — E785 Hyperlipidemia, unspecified: Secondary | ICD-10-CM | POA: Diagnosis not present

## 2020-03-18 DIAGNOSIS — Z23 Encounter for immunization: Secondary | ICD-10-CM | POA: Diagnosis not present

## 2020-03-18 DIAGNOSIS — R5383 Other fatigue: Secondary | ICD-10-CM | POA: Diagnosis not present

## 2020-03-18 DIAGNOSIS — R5381 Other malaise: Secondary | ICD-10-CM | POA: Diagnosis not present

## 2020-03-19 LAB — LIPID PANEL
Cholesterol: 308 mg/dL — ABNORMAL HIGH (ref <200)
HDL: 61 mg/dL (ref >=50)
LDL Cholesterol (Calc): 217 mg/dL (calc) — ABNORMAL HIGH
Non-HDL Cholesterol (Calc): 247 mg/dL (calc) — ABNORMAL HIGH (ref <130)
Total CHOL/HDL Ratio: 5 (calc) — ABNORMAL HIGH (ref <5.0)
Triglycerides: 142 mg/dL (ref <150)

## 2020-03-19 LAB — PROGESTERONE: Progesterone: 7.4 ng/mL

## 2020-03-19 LAB — T3, FREE: T3, Free: 2.9 pg/mL (ref 2.3–4.2)

## 2020-03-19 LAB — ESTRADIOL: Estradiol: 55 pg/mL

## 2020-03-21 ENCOUNTER — Other Ambulatory Visit (INDEPENDENT_AMBULATORY_CARE_PROVIDER_SITE_OTHER): Payer: Self-pay | Admitting: Internal Medicine

## 2020-03-21 MED ORDER — PROGESTERONE MICRONIZED 100 MG PO CAPS: 100.0000 mg | ORAL_CAPSULE | Freq: Every day | ORAL | 3 refills | Status: DC

## 2020-03-21 MED ORDER — THYROID 60 MG PO TABS: 60.0000 mg | ORAL_TABLET | Freq: Every day | ORAL | 3 refills | Status: DC

## 2020-03-21 NOTE — Progress Notes (Signed)
Patient called.  Ask her to go into Mychart and see her message from Dr Anastasio Champion on instructions.

## 2020-03-21 NOTE — Progress Notes (Signed)
Your cholesterol level remains elevated.  However, your thyroid level is not still optimal so I am going to increase your NP thyroid dose.You will now be taking NP thyroid 120 mg in the morning and NP thyroid 60 mg tablet take 1 at lunchtime.  I have sent a new prescription to [facility] for NP thyroid 60 mg tablets.  You have NP thyroid 30 mg tablets I believe and you can take 2 of those at lunchtime now and hopefully will tolerate this.Also, your progesterone level is not where I want it so in addition to taking progesterone 200 mg capsule at night, I have sent a new prescription for progesterone 100 mg capsule which she will take in addition to the other capsule for total dose of progesterone 300 mg in the evening.If you have any questions, do not hesitate to contact me through [place].

## 2020-03-21 NOTE — Progress Notes (Signed)
Please call this patient.Your cholesterol level remains elevated.  However, your thyroid level is not still optimal so I am going to increase your NP thyroid dose.You will now be taking NP thyroid 120 mg in the morning and NP thyroid 60 mg tablet take 1 at lunchtime.  I have sent a new prescription to [facility] for NP thyroid 60 mg tablets.  You have NP thyroid 30 mg tablets I believe and you can take 2 of those at lunchtime now and hopefully will tolerate this.Also, your progesterone level is not where I want it so in addition to taking progesterone 200 mg capsule at night, I have sent a new prescription for progesterone 100 mg capsule which she will take in addition to the other capsule for total dose of progesterone 300 mg in the evening.If you have any questions, do not hesitate to contact me through [place].

## 2020-03-23 ENCOUNTER — Other Ambulatory Visit: Payer: Self-pay

## 2020-03-23 ENCOUNTER — Encounter (HOSPITAL_COMMUNITY): Payer: Self-pay | Admitting: Physical Therapy

## 2020-03-23 ENCOUNTER — Ambulatory Visit (HOSPITAL_COMMUNITY): Payer: Medicare Other | Attending: Internal Medicine | Admitting: Physical Therapy

## 2020-03-23 DIAGNOSIS — M6281 Muscle weakness (generalized): Secondary | ICD-10-CM | POA: Insufficient documentation

## 2020-03-23 DIAGNOSIS — M546 Pain in thoracic spine: Secondary | ICD-10-CM | POA: Diagnosis not present

## 2020-03-23 DIAGNOSIS — R293 Abnormal posture: Secondary | ICD-10-CM | POA: Insufficient documentation

## 2020-03-23 NOTE — Therapy (Signed)
[place], [name], [address] Phone: [phone]   Fax:  [phone]  Physical Therapy Treatment  Patient Details  Name: Heather [name] MRN: [id number] Date of Birth: 1946/09/08 Referring Provider (PT): Hurshel Party MD   Encounter Date: 03/23/2020  PT End of Session - 03/23/20 1121    Visit Number  2    Number of Visits  8    Date for PT Re-Evaluation  04/15/20    Authorization Type  Primary: medicare secondary generic commercial    Progress Note Due on Visit  8    PT Start Time  1034    PT Stop Time  1117    PT Time Calculation (min)  43 min    Activity Tolerance  Patient tolerated treatment well    Behavior During Therapy  [facility] for tasks assessed/performed       Past Medical History:  Diagnosis Date  . Adrenal adenoma   . Allergy   . Anemia   . Anxiety   . CAD (coronary artery disease)    50-60% mid LAD at cardiac catheterization 2014 [facility])  . Cataract   . Depression   . DJD (degenerative joint disease) of cervical spine 09/12/2016  . History of cardiomyopathy   . History of tobacco use   . Hyperlipidemia   . Neuromuscular disorder ([place])   . PAF (paroxysmal atrial fibrillation) [facility])    Diagnosis July 2017 - spontaneously resolved  . Statin intolerance     Past Surgical History:  Procedure Laterality Date  . ABDOMINAL HYSTERECTOMY  1980   endometriosis  . CATARACT EXTRACTION Left 11/2018  . LEFT HEART CATH AND CORONARY ANGIOGRAPHY N/A 09/25/2018   Procedure: LEFT HEART CATH AND CORONARY ANGIOGRAPHY;  Surgeon: Belva Crome, MD;  Location: [place];  Service: Cardiovascular;  Laterality: N/A;  . TONSILLECTOMY  1954    There were no vitals filed for this visit.  Subjective Assessment - 03/23/20 1035    Subjective  She got the botox shot and the pain decreased immediately. She continues to have pain. She thinks she has neuropathy starting. She has been having pain in R  shoulder. She has wanted to try acupuncture but there is no one around here.    Limitations  House hold activities;Other (comment);Writing   gardening   Patient Stated Goals  Eliminate Pain    Currently in Pain?  Yes    Pain Score  4     Pain Location  Back         [facility] PT Assessment - 03/23/20 0001      Palpation   Spinal mobility  R C2-C7, greatest tenderness and restriction C5-C7, reproduction of symptoms    Palpation comment  TTP R thoracic and cervical paraspinals, R periscapular muscles, R ribs 3-9                    [facility] Adult PT Treatment/Exercise - 03/23/20 0001      Manual Therapy   Manual Therapy  Joint mobilization;Soft tissue mobilization    Manual therapy comments  completed independently from all other aspects of treatment    Joint Mobilization  R UPA C2-T1, R Rib 3-9    Soft tissue mobilization  cervical paraspinals             PT Education - 03/23/20 1053    Education Details  Patient educated on manual therapy, possible cervical contribution to symptoms, posture, POC, foam rolling,  self mobs with ball    Person(s) Educated  Patient    Methods  Explanation;Demonstration    Comprehension  Verbalized understanding;Returned demonstration       PT Short Term Goals - 03/23/20 1222      PT SHORT TERM GOAL #1   Title  Patient will be independent with HEP in order to improve functional outcomes.    Time  2    Period  Weeks    Status  On-going    Target Date  03/30/20      PT SHORT TERM GOAL #2   Title  Patient will report at least 25% improvement in symptoms for improved quality of life.    Time  2    Period  Weeks    Status  On-going    Target Date  03/30/20        PT Long Term Goals - 03/23/20 1222      PT LONG TERM GOAL #1   Title  Patient will report at least 75% improvement in symptoms for improved quality of life.    Time  4    Period  Weeks    Status  On-going      PT LONG TERM GOAL #2   Title  Patient will be able  to garden and use the computer without pain greater than 3/10 for improved tolerance to activity.    Time  4    Period  Weeks    Status  On-going      PT LONG TERM GOAL #3   Title  Patient will be able to decrease her symptoms with HEP in order manage symptoms independently.    Time  4    Period  Weeks    Status  On-going      PT LONG TERM GOAL #4   Status  On-going            Plan - 03/23/20 1221    Clinical Impression Statement  Patient is hypomobile in cervical spine and R ribs. Patient has "waves" of increase/decrease in mid back symptoms with mobilizations of cervical spine with greatest affect noted at lower cervical spine on R. Patient tolerates manual therapy well to cervical spine and rib mobilization as well as STM of affected regions. Patient experiences decrease in symptoms following session. Patient will continue to benefit from skilled physical therapy in order to reduce impairment and improve function.    Personal Factors and Comorbidities  Age;Behavior Pattern;Time since onset of injury/illness/exacerbation;Past/Current Experience;Profession    Examination-Activity Limitations  Bend;Sit    Examination-Participation Restrictions  Yard Work;Cleaning;Community Activity    Rehab Potential  Fair    PT Frequency  2x / week    PT Duration  4 weeks    PT Treatment/Interventions  ADLs/Self Care Home Management;Aquatic Therapy;Biofeedback;Cryotherapy;Electrical Stimulation;Iontophoresis 4mg /ml Dexamethasone;Moist Heat;Traction;Ultrasound;DME Instruction;Gait training;Stair training;Functional mobility training;Therapeutic activities;Therapeutic exercise;Balance training;Orthotic Fit/Training;Patient/family education;Neuromuscular re-education;Manual techniques;Manual lymph drainage;Compression bandaging;Scar mobilization;Passive range of motion;Dry needling;Energy conservation;Splinting;Taping;Spinal Manipulations;Joint Manipulations    PT Next Visit Plan  continue with manual  therapy with STM and mobs to ribs/ tspine and c/sp, assess for trigger points, begin postural strengthening    PT Home Exercise Plan  initiate next session    Consulted and Agree with Plan of Care  Patient       Patient will benefit from skilled therapeutic intervention in order to improve the following deficits and impairments:  Decreased activity tolerance, Decreased mobility, Decreased strength, Decreased endurance, Decreased range of motion, Hypomobility, Increased muscle spasms, Increased  fascial restricitons, Improper body mechanics, Postural dysfunction, Pain  Visit Diagnosis: Pain in thoracic spine  Abnormal posture  Muscle weakness (generalized)     Problem List Patient Active Problem List   Diagnosis Date Noted  . Cough 12/14/2019  . History of COVID-19 12/10/2019  . Abnormal myocardial perfusion study   . Muscle spasm 02/25/2018  . Tubular adenoma of colon 07/19/2017  . H. pylori infection 07/19/2017  . Thoracic back pain 02/08/2017  . Hx of colonic polyps 12/03/2016  . Family history of colon cancer 12/03/2016  . Encounter for therapeutic drug monitoring 11/21/2016  . CAD (coronary artery disease) 09/12/2016  . PAF (paroxysmal atrial fibrillation) ([place]) 09/12/2016  . Syncope 09/12/2016  . DJD (degenerative joint disease) of cervical spine 09/12/2016  . Statin intolerance 09/12/2016  . TIA (transient ischemic attack) 09/12/2016  . Diverticulitis of colon 09/12/2016  . Myofascial pain syndrome 09/06/2016  . Takotsubo syndrome 09/06/2016  . HLD (hyperlipidemia) 09/06/2016  . Depression 09/06/2016  . Recurrent UTI (urinary tract infection) 09/06/2016   1:37 PM, 03/23/20 Mearl Latin PT, DPT Physical Therapist at [place], [name], [address] Phone: [phone]   Fax:  [phone]  Name: Lisann Kirkpatrick MRN: [id number] Date of Birth: 1946/03/16

## 2020-03-24 ENCOUNTER — Encounter (INDEPENDENT_AMBULATORY_CARE_PROVIDER_SITE_OTHER): Payer: Self-pay | Admitting: Internal Medicine

## 2020-03-28 ENCOUNTER — Other Ambulatory Visit (HOSPITAL_COMMUNITY): Payer: Self-pay | Admitting: Internal Medicine

## 2020-03-28 ENCOUNTER — Telehealth: Payer: Self-pay | Admitting: Neurology

## 2020-03-28 ENCOUNTER — Other Ambulatory Visit (INDEPENDENT_AMBULATORY_CARE_PROVIDER_SITE_OTHER): Payer: Self-pay

## 2020-03-28 DIAGNOSIS — Z1231 Encounter for screening mammogram for malignant neoplasm of breast: Secondary | ICD-10-CM

## 2020-03-28 MED ORDER — PROGESTERONE MICRONIZED 100 MG PO CAPS: 100.0000 mg | ORAL_CAPSULE | Freq: Every day | ORAL | 3 refills | Status: DC

## 2020-03-28 MED ORDER — OXYCODONE-ACETAMINOPHEN 5-325 MG PO TABS: ORAL_TABLET | ORAL | 0 refills | Status: DC

## 2020-03-28 MED ORDER — THYROID 120 MG PO TABS: 120.0000 mg | ORAL_TABLET | Freq: Every day | ORAL | 0 refills | Status: DC

## 2020-03-28 NOTE — Telephone Encounter (Signed)
Pt called stating that she is wanting a refill on Percocet 325mg  and have it sent to the [facility].

## 2020-03-30 ENCOUNTER — Other Ambulatory Visit: Payer: Self-pay

## 2020-03-30 ENCOUNTER — Ambulatory Visit (HOSPITAL_COMMUNITY): Payer: Medicare Other | Admitting: Physical Therapy

## 2020-03-30 ENCOUNTER — Encounter (HOSPITAL_COMMUNITY): Payer: Self-pay | Admitting: Physical Therapy

## 2020-03-30 DIAGNOSIS — M546 Pain in thoracic spine: Secondary | ICD-10-CM | POA: Diagnosis not present

## 2020-03-30 DIAGNOSIS — R293 Abnormal posture: Secondary | ICD-10-CM | POA: Diagnosis not present

## 2020-03-30 DIAGNOSIS — M6281 Muscle weakness (generalized): Secondary | ICD-10-CM

## 2020-03-30 NOTE — Patient Instructions (Signed)
Access Code: [email or web address] URL: [email or web address] Date: 03/30/2020 Prepared by: Mitzi Hansen Darreon Lutes  Exercises Supine Chin Tuck - 3 x daily - 7 x weekly - 2 sets - 10 reps - 2 second hold Seated Scapular Retraction - 1 x daily - 7 x weekly - 2 sets - 10 reps

## 2020-03-30 NOTE — Therapy (Signed)
[place], [name], [address] Phone: [phone]   Fax:  [phone]  Physical Therapy Treatment  Patient Details  Name: Heather [name] MRN: [id number] Date of Birth: 07-Dec-1945 Referring Provider (PT): Hurshel Party MD   Encounter Date: 03/30/2020  PT End of Session - 03/30/20 1053    Visit Number  3    Number of Visits  8    Date for PT Re-Evaluation  04/15/20    Authorization Type  Primary: medicare secondary generic commercial    Progress Note Due on Visit  8    PT Start Time  0950    PT Stop Time  1029    PT Time Calculation (min)  39 min    Activity Tolerance  Patient tolerated treatment well    Behavior During Therapy  [facility] for tasks assessed/performed       Past Medical History:  Diagnosis Date  . Adrenal adenoma   . Allergy   . Anemia   . Anxiety   . CAD (coronary artery disease)    50-60% mid LAD at cardiac catheterization 2014 [facility])  . Cataract   . Depression   . DJD (degenerative joint disease) of cervical spine 09/12/2016  . History of cardiomyopathy   . History of tobacco use   . Hyperlipidemia   . Neuromuscular disorder ([place])   . PAF (paroxysmal atrial fibrillation) [facility])    Diagnosis July 2017 - spontaneously resolved  . Statin intolerance     Past Surgical History:  Procedure Laterality Date  . ABDOMINAL HYSTERECTOMY  1980   endometriosis  . CATARACT EXTRACTION Left 11/2018  . LEFT HEART CATH AND CORONARY ANGIOGRAPHY N/A 09/25/2018   Procedure: LEFT HEART CATH AND CORONARY ANGIOGRAPHY;  Surgeon: Belva Crome, MD;  Location: [place] [facility];  Service: Cardiovascular;  Laterality: N/A;  . TONSILLECTOMY  1954    There were no vitals filed for this visit.  Subjective Assessment - 03/30/20 0951    Subjective  She states pain in back is a little more defuse and less intense. Her neck and UT has been more sore.    Limitations  House hold activities;Other  (comment);Writing   gardening   Patient Stated Goals  Eliminate Pain    Currently in Pain?  Yes    Pain Score  3     Pain Location  Back    Pain Orientation  Right    Pain Descriptors / Indicators  Burning                        [facility] Adult PT Treatment/Exercise - 03/30/20 0001      Exercises   Exercises  Neck      Neck Exercises: Seated   Other Seated Exercise  scapular retractions 2x10      Neck Exercises: Supine   Neck Retraction  10 reps;3 secs    Neck Retraction Limitations  2 sets      Manual Therapy   Manual Therapy  Joint mobilization;Soft tissue mobilization    Manual therapy comments  completed independently from all other aspects of treatment    Joint Mobilization  R UPA C2-T1,    Soft tissue mobilization  cervical paraspinals             PT Education - 03/30/20 1052    Education Details  frequency of HEP and proper mechanics as well as possible cervical contribution to symptoms and cervical anatomy  Person(s) Educated  Patient    Methods  Explanation;Demonstration;Handout    Comprehension  Verbalized understanding;Returned demonstration       PT Short Term Goals - 03/23/20 1222      PT SHORT TERM GOAL #1   Title  Patient will be independent with HEP in order to improve functional outcomes.    Time  2    Period  Weeks    Status  On-going    Target Date  03/30/20      PT SHORT TERM GOAL #2   Title  Patient will report at least 25% improvement in symptoms for improved quality of life.    Time  2    Period  Weeks    Status  On-going    Target Date  03/30/20        PT Long Term Goals - 03/23/20 1222      PT LONG TERM GOAL #1   Title  Patient will report at least 75% improvement in symptoms for improved quality of life.    Time  4    Period  Weeks    Status  On-going      PT LONG TERM GOAL #2   Title  Patient will be able to garden and use the computer without pain greater than 3/10 for improved tolerance to activity.     Time  4    Period  Weeks    Status  On-going      PT LONG TERM GOAL #3   Title  Patient will be able to decrease her symptoms with HEP in order manage symptoms independently.    Time  4    Period  Weeks    Status  On-going      PT LONG TERM GOAL #4   Status  On-going            Plan - 03/30/20 1054    Clinical Impression Statement  Patient tolerates manual therapy well with reproduction of symptoms with R UPA at C6-C7 region. She experiences reduction in symptoms following mobilizations. She requires tactile and verbal cueing for proper mechanics of retraction exercises today. She has reproduction of symptoms with cervical retraction in supine which decreases intensity with reps. She is educated on frequency of HEP and proper mechanics as well as possible cervical contribution to symptoms and cervical anatomy. Patient will continue to benefit from skilled physical therapy in order to reduce impairment and improve function.    Personal Factors and Comorbidities  Age;Behavior Pattern;Time since onset of injury/illness/exacerbation;Past/Current Experience;Profession    Examination-Activity Limitations  Bend;Sit    Examination-Participation Restrictions  Yard Work;Cleaning;Community Activity    Rehab Potential  Fair    PT Frequency  2x / week    PT Duration  4 weeks    PT Treatment/Interventions  ADLs/Self Care Home Management;Aquatic Therapy;Biofeedback;Cryotherapy;Electrical Stimulation;Iontophoresis 18m/ml Dexamethasone;Moist Heat;Traction;Ultrasound;DME Instruction;Gait training;Stair training;Functional mobility training;Therapeutic activities;Therapeutic exercise;Balance training;Orthotic Fit/Training;Patient/family education;Neuromuscular re-education;Manual techniques;Manual lymph drainage;Compression bandaging;Scar mobilization;Passive range of motion;Dry needling;Energy conservation;Splinting;Taping;Spinal Manipulations;Joint Manipulations    PT Next Visit Plan  continue with  manual therapy with STM and mobs to ribs/ tspine and c/sp, assess for trigger points, begin postural strengthening    PT Home Exercise Plan  5/19 c/sp ret, scap ret    Consulted and Agree with Plan of Care  Patient       Patient will benefit from skilled therapeutic intervention in order to improve the following deficits and impairments:  Decreased activity tolerance, Decreased mobility, Decreased strength, Decreased endurance, Decreased  range of motion, Hypomobility, Increased muscle spasms, Increased fascial restricitons, Improper body mechanics, Postural dysfunction, Pain  Visit Diagnosis: Pain in thoracic spine  Abnormal posture  Muscle weakness (generalized)     Problem List Patient Active Problem List   Diagnosis Date Noted  . Cough 12/14/2019  . History of COVID-19 12/10/2019  . Abnormal myocardial perfusion study   . Muscle spasm 02/25/2018  . Tubular adenoma of colon 07/19/2017  . H. pylori infection 07/19/2017  . Thoracic back pain 02/08/2017  . Hx of colonic polyps 12/03/2016  . Family history of colon cancer 12/03/2016  . Encounter for therapeutic drug monitoring 11/21/2016  . CAD (coronary artery disease) 09/12/2016  . PAF (paroxysmal atrial fibrillation) ([place]) 09/12/2016  . Syncope 09/12/2016  . DJD (degenerative joint disease) of cervical spine 09/12/2016  . Statin intolerance 09/12/2016  . TIA (transient ischemic attack) 09/12/2016  . Diverticulitis of colon 09/12/2016  . Myofascial pain syndrome 09/06/2016  . Takotsubo syndrome 09/06/2016  . HLD (hyperlipidemia) 09/06/2016  . Depression 09/06/2016  . Recurrent UTI (urinary tract infection) 09/06/2016    Vianne Bulls Merrill Deanda 03/30/2020, 10:59 AM  [place], [name], [address] Phone: [phone]   Fax:  [phone]  Name: Easton Sivertson MRN: [id number] Date of Birth: [birth year]

## 2020-03-31 ENCOUNTER — Encounter (INDEPENDENT_AMBULATORY_CARE_PROVIDER_SITE_OTHER): Payer: Self-pay | Admitting: Internal Medicine

## 2020-03-31 ENCOUNTER — Ambulatory Visit (INDEPENDENT_AMBULATORY_CARE_PROVIDER_SITE_OTHER): Payer: Medicare Other | Admitting: Internal Medicine

## 2020-03-31 VITALS — BP 100/50 | HR 60 | Temp 98.0°F | Ht 64.0 in | Wt 160.2 lb

## 2020-03-31 DIAGNOSIS — E2839 Other primary ovarian failure: Secondary | ICD-10-CM

## 2020-03-31 DIAGNOSIS — R5383 Other fatigue: Secondary | ICD-10-CM | POA: Diagnosis not present

## 2020-03-31 DIAGNOSIS — I2510 Atherosclerotic heart disease of native coronary artery without angina pectoris: Secondary | ICD-10-CM

## 2020-03-31 DIAGNOSIS — E785 Hyperlipidemia, unspecified: Secondary | ICD-10-CM | POA: Diagnosis not present

## 2020-03-31 DIAGNOSIS — R5381 Other malaise: Secondary | ICD-10-CM | POA: Diagnosis not present

## 2020-03-31 DIAGNOSIS — R202 Paresthesia of skin: Secondary | ICD-10-CM

## 2020-03-31 DIAGNOSIS — R200 Anesthesia of skin: Secondary | ICD-10-CM

## 2020-03-31 MED ORDER — PROGESTERONE 200 MG PO CAPS: 200.0000 mg | ORAL_CAPSULE | Freq: Every day | ORAL | 1 refills | Status: DC

## 2020-03-31 NOTE — Progress Notes (Signed)
Metrics: Intervention Frequency ACO  Documented Smoking Status Yearly  Screened one or more times in 24 months  Cessation Counseling or  Active cessation medication Past 24 months  Past 24 months   Guideline developer: UpToDate (See UpToDate for funding source) Date Released: 2014       Wellness Office Visit  Subjective:  Patient ID: Heather [name]    DOB: May 12, 1946  Age: 74 y.o. MRN: [id number]  CC: This lady came in because of abnormal sensation on the sole of her left foot. HPI She has had the symptoms for last 2 weeks or so.  She describes a numbness feeling on the left foot and a cold feeling.  She has had no injury thankfully.  She wonders if she has peripheral neuropathy. She also requires refill of medications for 90-day supply. She has started high doses of NP thyroid and progesterone just very recently.  She is tolerating it so far.  Past Medical History:  Diagnosis Date  . Adrenal adenoma   . Allergy   . Anemia   . Anxiety   . CAD (coronary artery disease)    50-60% mid LAD at cardiac catheterization 2014 [facility])  . Cataract   . Depression   . DJD (degenerative joint disease) of cervical spine 09/12/2016  . History of cardiomyopathy   . History of tobacco use   . Hyperlipidemia   . Neuromuscular disorder ([place])   . PAF (paroxysmal atrial fibrillation) [facility])    Diagnosis July 2017 - spontaneously resolved  . Statin intolerance       Family History  Problem Relation Age of Onset  . COPD Mother   . Hearing loss Mother   . Stroke Mother   . Heart disease Mother        chf, died at 77  . Arthritis Father   . Hypertension Father   . Heart disease Father        cad, pvd, died at 39  . Heart disease Sister        heart valve  . Arthritis Sister   . Hyperlipidemia Brother   . Hypertension Brother   . 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 Smoking status: Former Smoker    Packs/day: 0.00    Types: Cigarettes    Start date: 11/12/1965    Quit date: 09/06/2008    Years since quitting: 11.5  . Smokeless tobacco: Never Used  Substance Use Topics  . Alcohol use: Yes    Alcohol/week: 1.0 standard drinks    Types: 1 Glasses of wine per week    Comment: occ    Current Meds  Medication Sig  . Biotin 10000 MCG TABS Take 10,000 mcg by mouth daily.   Marland Kitchen buPROPion (WELLBUTRIN SR) 150 MG 12 hr tablet Take 1 tablet (150 mg total) by mouth 2 (two) times daily.  . Cholecalciferol (VITAMIN D3) 125 MCG (5000 UT) TABS Take 15,000 Units by mouth daily.   . Coenzyme Q10 100 MG capsule Take 100 mg by mouth daily.   . Cyanocobalamin (VITAMIN B-12) 2500 MCG SUBL Place 2,500 mcg under the tongue daily.  Marland Kitchen docusate sodium (COLACE) 100 MG capsule Take 100 mg by mouth 2 (two) times daily.   Marland Kitchen estradiol (ESTRACE) 2 MG tablet Take 1  tablet by mouth daily  . oxyCODONE-acetaminophen (PERCOCET/ROXICET) 5-325 MG tablet TAKE 1 TABLET DAILY AS NEEDED FOR SEVERE PAIN.  Marland Kitchen progesterone (PROMETRIUM) 100 MG capsule Take 1 capsule (100 mg total) by mouth daily.  . Testosterone 20 % CREA Apply 1 application topically daily.   Marland Kitchen thyroid (NP THYROID) 120 MG tablet Take 1 tablet (120 mg total) by mouth daily.  Marland Kitchen thyroid (NP THYROID) 60 MG tablet Take 1 tablet (60 mg total) by mouth daily before lunch.  . warfarin (COUMADIN) 5 MG tablet Take 2 tablets by mouth daily or as directed.  . [DISCONTINUED] progesterone (PROMETRIUM) 200 MG capsule Take 1 capsule (200 mg total) by mouth daily.      Depression screen [facility] 2/9 03/31/2020 12/14/2019 12/16/2017 09/24/2017 03/07/2017  Decreased Interest 0 0 0 0 0  Down, Depressed, Hopeless 0 0 0 0 0  PHQ - 2 Score 0 0 0 0 0     Objective:   Today's Vitals: BP (!) 100/50 (BP Location: Right Arm, Patient Position: Sitting, Cuff Size: Normal)   Pulse 60   Temp 98 F (36.7  C) (Temporal)   Ht 5\' 4"  (1.626 m)   Wt 160 lb 3.2 oz (72.7 kg)   LMP 07/08/1979 (Approximate)   SpO2 97%   BMI 27.50 kg/m  Vitals with BMI 03/31/2020 03/08/2020 02/25/2020  Height 5\' 4"  5\' 5"  5\' 5"   Weight 160 lbs 3 oz 161 lbs 8 oz 163 lbs 6 oz  BMI 27.48 123456 0000000  Systolic 123XX123 123456 0000000  Diastolic 50 78 70  Pulse 60 55 59     Physical Exam   General systemically well.  Weight is stable.  Blood pressure is excellent.    Assessment   1. Malaise and fatigue   2. Hyperlipidemia, unspecified hyperlipidemia type   3. Primary ovarian failure   4. Numbness and tingling of foot       Tests ordered Orders Placed This Encounter  Procedures  . T3, free  . Progesterone     Plan: 1. In terms of her abnormal sensations of the left foot, she may have peripheral neuropathy and I offered neurological assessment but she feels that she can wait.  She is seeing a neurologist in July. 2. I have ordered blood work that needs to be done in the next week or 2 for her T3 and progesterone levels and we can that assess if we need to alter doses. 3. Follow-up in 3 months.   Meds ordered this encounter  Medications  . progesterone (PROMETRIUM) 200 MG capsule    Sig: Take 1 capsule (200 mg total) by mouth daily.    Dispense:  90 capsule    Refill:  1    Heather Sasaki Luther Parody, MD

## 2020-04-02 ENCOUNTER — Encounter (INDEPENDENT_AMBULATORY_CARE_PROVIDER_SITE_OTHER): Payer: Self-pay | Admitting: Internal Medicine

## 2020-04-06 ENCOUNTER — Ambulatory Visit (HOSPITAL_COMMUNITY): Payer: Medicare Other | Admitting: Physical Therapy

## 2020-04-06 ENCOUNTER — Other Ambulatory Visit: Payer: Self-pay

## 2020-04-06 ENCOUNTER — Encounter (HOSPITAL_COMMUNITY): Payer: Self-pay | Admitting: Physical Therapy

## 2020-04-06 DIAGNOSIS — M546 Pain in thoracic spine: Secondary | ICD-10-CM

## 2020-04-06 DIAGNOSIS — R293 Abnormal posture: Secondary | ICD-10-CM | POA: Diagnosis not present

## 2020-04-06 DIAGNOSIS — M6281 Muscle weakness (generalized): Secondary | ICD-10-CM

## 2020-04-06 NOTE — Therapy (Signed)
[place], [name], [address] Phone: [phone]   Fax:  [phone]  Physical Therapy Treatment  Patient Details  Name: Heather [name] MRN: [id number] Date of Birth: 1946/01/06 Referring Provider (PT): Hurshel Party MD   Encounter Date: 04/06/2020  PT End of Session - 04/06/20 1010    Visit Number  4    Number of Visits  8    Date for PT Re-Evaluation  04/15/20    Authorization Type  Primary: medicare secondary generic commercial    Progress Note Due on Visit  8    PT Start Time  [phone]   arrives late   PT Stop Time  1030    PT Time Calculation (min)  35 min    Activity Tolerance  Patient tolerated treatment well    Behavior During Therapy  [facility] for tasks assessed/performed       Past Medical History:  Diagnosis Date  . Adrenal adenoma   . Allergy   . Anemia   . Anxiety   . CAD (coronary artery disease)    50-60% mid LAD at cardiac catheterization 2014 [facility])  . Cataract   . Depression   . DJD (degenerative joint disease) of cervical spine 09/12/2016  . History of cardiomyopathy   . History of tobacco use   . Hyperlipidemia   . Neuromuscular disorder ([place])   . PAF (paroxysmal atrial fibrillation) [facility])    Diagnosis July 2017 - spontaneously resolved  . Statin intolerance     Past Surgical History:  Procedure Laterality Date  . ABDOMINAL HYSTERECTOMY  1980   endometriosis  . CATARACT EXTRACTION Left 11/2018  . LEFT HEART CATH AND CORONARY ANGIOGRAPHY N/A 09/25/2018   Procedure: LEFT HEART CATH AND CORONARY ANGIOGRAPHY;  Surgeon: Belva Crome, MD;  Location: [place] [facility];  Service: Cardiovascular;  Laterality: N/A;  . TONSILLECTOMY  1954    There were no vitals filed for this visit.  Subjective Assessment - 04/06/20 0955    Subjective  Patient states her back and neck have been pretty good. She continues to take pain meds with higher levels of activity. She feels that treatment  has been helping.    Limitations  House hold activities;Other (comment);Writing   gardening   Patient Stated Goals  Eliminate Pain    Currently in Pain?  Yes    Pain Score  3     Pain Location  Back                        [facility] Adult PT Treatment/Exercise - 04/06/20 0001      Neck Exercises: Theraband   Scapula Retraction  10 reps;Green    Scapula Retraction Limitations  2 sets    Shoulder Extension  10 reps;Green    Shoulder Extension Limitations  2 sets      Neck Exercises: Seated   Other Seated Exercise  thoracic extension over chair 10x 5 second holds      Neck Exercises: Supine   Neck Retraction  10 reps;3 secs    Neck Retraction Limitations  2 sets      Manual Therapy   Manual Therapy  Joint mobilization;Soft tissue mobilization    Manual therapy comments  completed independently from all other aspects of treatment    Joint Mobilization  R UPA C2-T1    Soft tissue mobilization  cervical paraspinals  PT Education - 04/06/20 0959    Education Details  frequency of HEP and proper mechanics as well as possible cervical contribution to symptoms    Person(s) Educated  Patient    Methods  Explanation;Demonstration    Comprehension  Verbalized understanding;Returned demonstration       PT Short Term Goals - 03/23/20 1222      PT SHORT TERM GOAL #1   Title  Patient will be independent with HEP in order to improve functional outcomes.    Time  2    Period  Weeks    Status  On-going    Target Date  03/30/20      PT SHORT TERM GOAL #2   Title  Patient will report at least 25% improvement in symptoms for improved quality of life.    Time  2    Period  Weeks    Status  On-going    Target Date  03/30/20        PT Long Term Goals - 03/23/20 1222      PT LONG TERM GOAL #1   Title  Patient will report at least 75% improvement in symptoms for improved quality of life.    Time  4    Period  Weeks    Status  On-going      PT LONG  TERM GOAL #2   Title  Patient will be able to garden and use the computer without pain greater than 3/10 for improved tolerance to activity.    Time  4    Period  Weeks    Status  On-going      PT LONG TERM GOAL #3   Title  Patient will be able to decrease her symptoms with HEP in order manage symptoms independently.    Time  4    Period  Weeks    Status  On-going      PT LONG TERM GOAL #4   Status  On-going            Plan - 04/06/20 1012    Clinical Impression Statement  Patient tolerates manual therapy well with reproduction of thoracic symptoms with mobilizations in lower cervical spine. Symptoms reduce following mobilizations. She requires min verbal cueing for positioning and mechanics of postural strengthening exercises. She has increase in symptoms with cervical retraction in standing with row exercise. Patient performs seated thoracic extension with no change in symptoms. Patient wants to test foam roller for mobility next session. Patient will continue to benefit from skilled physical therapy in order to reduce impairment and improve function.    Personal Factors and Comorbidities  Age;Behavior Pattern;Time since onset of injury/illness/exacerbation;Past/Current Experience;Profession    Examination-Activity Limitations  Bend;Sit    Examination-Participation Restrictions  Yard Work;Cleaning;Community Activity    Rehab Potential  Fair    PT Frequency  2x / week    PT Duration  4 weeks    PT Treatment/Interventions  ADLs/Self Care Home Management;Aquatic Therapy;Biofeedback;Cryotherapy;Electrical Stimulation;Iontophoresis 35m/ml Dexamethasone;Moist Heat;Traction;Ultrasound;DME Instruction;Gait training;Stair training;Functional mobility training;Therapeutic activities;Therapeutic exercise;Balance training;Orthotic Fit/Training;Patient/family education;Neuromuscular re-education;Manual techniques;Manual lymph drainage;Compression bandaging;Scar mobilization;Passive range of  motion;Dry needling;Energy conservation;Splinting;Taping;Spinal Manipulations;Joint Manipulations    PT Next Visit Plan  continue with manual therapy with STM and mobs to ribs/ tspine and c/sp, assess for trigger points, continue postural strengthening    PT Home Exercise Plan  5/19 c/sp ret, scap ret 5/26 thoracic extension over chair    Consulted and Agree with Plan of Care  Patient  Patient will benefit from skilled therapeutic intervention in order to improve the following deficits and impairments:  Decreased activity tolerance, Decreased mobility, Decreased strength, Decreased endurance, Decreased range of motion, Hypomobility, Increased muscle spasms, Increased fascial restricitons, Improper body mechanics, Postural dysfunction, Pain  Visit Diagnosis: Pain in thoracic spine  Abnormal posture  Muscle weakness (generalized)     Problem List Patient Active Problem List   Diagnosis Date Noted  . Cough 12/14/2019  . History of COVID-19 12/10/2019  . Abnormal myocardial perfusion study   . Muscle spasm 02/25/2018  . Tubular adenoma of colon 07/19/2017  . H. pylori infection 07/19/2017  . Thoracic back pain 02/08/2017  . Hx of colonic polyps 12/03/2016  . Family history of colon cancer 12/03/2016  . Encounter for therapeutic drug monitoring 11/21/2016  . CAD (coronary artery disease) 09/12/2016  . PAF (paroxysmal atrial fibrillation) ([place]) 09/12/2016  . Syncope 09/12/2016  . DJD (degenerative joint disease) of cervical spine 09/12/2016  . Statin intolerance 09/12/2016  . TIA (transient ischemic attack) 09/12/2016  . Diverticulitis of colon 09/12/2016  . Myofascial pain syndrome 09/06/2016  . Takotsubo syndrome 09/06/2016  . HLD (hyperlipidemia) 09/06/2016  . Depression 09/06/2016  . Recurrent UTI (urinary tract infection) 09/06/2016    10:34 AM, 04/06/20 Mearl Latin PT, DPT Physical Therapist at [place], [name], [address] Phone: [phone]   Fax:  [phone]  Name: Heather [name] MRN: [id number] Date of Birth: Mar 24, 1946

## 2020-04-12 ENCOUNTER — Other Ambulatory Visit: Payer: Self-pay

## 2020-04-12 ENCOUNTER — Ambulatory Visit (HOSPITAL_COMMUNITY): Payer: Medicare Other | Attending: Internal Medicine | Admitting: Physical Therapy

## 2020-04-12 ENCOUNTER — Encounter (HOSPITAL_COMMUNITY): Payer: Self-pay | Admitting: Physical Therapy

## 2020-04-12 DIAGNOSIS — M6281 Muscle weakness (generalized): Secondary | ICD-10-CM | POA: Insufficient documentation

## 2020-04-12 DIAGNOSIS — R293 Abnormal posture: Secondary | ICD-10-CM | POA: Insufficient documentation

## 2020-04-12 DIAGNOSIS — M546 Pain in thoracic spine: Secondary | ICD-10-CM | POA: Insufficient documentation

## 2020-04-12 NOTE — Therapy (Signed)
[place], [name], [address] Phone: [phone]   Fax:  [phone]  Physical Therapy Treatment  Patient Details  Name: Heather [name] MRN: [id number] Date of Birth: [birth year] Referring Provider (PT): Hurshel Party MD   Encounter Date: 04/12/2020  PT End of Session - 04/12/20 0827    Visit Number  5    Number of Visits  8    Date for PT Re-Evaluation  04/15/20    Authorization Type  Primary: medicare secondary generic commercial    Progress Note Due on Visit  8    PT Start Time  0822    PT Stop Time  0900    PT Time Calculation (min)  38 min    Activity Tolerance  Patient tolerated treatment well    Behavior During Therapy  [facility] for tasks assessed/performed       Past Medical History:  Diagnosis Date  . Adrenal adenoma   . Allergy   . Anemia   . Anxiety   . CAD (coronary artery disease)    50-60% mid LAD at cardiac catheterization 2014 [facility])  . Cataract   . Depression   . DJD (degenerative joint disease) of cervical spine 09/12/2016  . History of cardiomyopathy   . History of tobacco use   . Hyperlipidemia   . Neuromuscular disorder ([place])   . PAF (paroxysmal atrial fibrillation) [facility])    Diagnosis July 2017 - spontaneously resolved  . Statin intolerance     Past Surgical History:  Procedure Laterality Date  . ABDOMINAL HYSTERECTOMY  1980   endometriosis  . CATARACT EXTRACTION Left 11/2018  . LEFT HEART CATH AND CORONARY ANGIOGRAPHY N/A 09/25/2018   Procedure: LEFT HEART CATH AND CORONARY ANGIOGRAPHY;  Surgeon: Belva Crome, MD;  Location: [place] [facility];  Service: Cardiovascular;  Laterality: N/A;  . TONSILLECTOMY  1954    There were no vitals filed for this visit.  Subjective Assessment - 04/12/20 5038    Subjective  Patient states she has been pain free for almost 4 days. She had a little bit of a strain yesterday with gardening. She has not had to take pain meds. She has  minimal symptoms today.    Limitations  House hold activities;Other (comment);Writing   gardening   Patient Stated Goals  Eliminate Pain    Currently in Pain?  Yes    Pain Score  1     Pain Location  Back    Pain Orientation  Right                        [facility] Adult PT Treatment/Exercise - 04/12/20 0001      Neck Exercises: Supine   Neck Retraction  10 reps;3 secs    Neck Retraction Limitations  2 sets      Manual Therapy   Manual Therapy  Joint mobilization;Soft tissue mobilization    Manual therapy comments  completed independently from all other aspects of treatment    Joint Mobilization  R UPA C2-T1    Soft tissue mobilization  cervical paraspinals             PT Education - 04/12/20 0827    Education Details  frequency of HEP and proper mechanics as well as possible cervical contribution to symptoms, posture, foam rolling, osteoporosis, cervical anatomy    Person(s) Educated  Patient    Methods  Explanation;Demonstration    Comprehension  Verbalized understanding;Returned demonstration  PT Short Term Goals - 03/23/20 1222      PT SHORT TERM GOAL #1   Title  Patient will be independent with HEP in order to improve functional outcomes.    Time  2    Period  Weeks    Status  On-going    Target Date  03/30/20      PT SHORT TERM GOAL #2   Title  Patient will report at least 25% improvement in symptoms for improved quality of life.    Time  2    Period  Weeks    Status  On-going    Target Date  03/30/20        PT Long Term Goals - 03/23/20 1222      PT LONG TERM GOAL #1   Title  Patient will report at least 75% improvement in symptoms for improved quality of life.    Time  4    Period  Weeks    Status  On-going      PT LONG TERM GOAL #2   Title  Patient will be able to garden and use the computer without pain greater than 3/10 for improved tolerance to activity.    Time  4    Period  Weeks    Status  On-going      PT LONG  TERM GOAL #3   Title  Patient will be able to decrease her symptoms with HEP in order manage symptoms independently.    Time  4    Period  Weeks    Status  On-going      PT LONG TERM GOAL #4   Status  On-going            Plan - 04/12/20 0831    Clinical Impression Statement  Patient showing good carry over in symptom decrease from prior sessions. She has been compliant with HEP which is likely contributing to improvement in symptoms. She has min/no reproduction of symptoms with mobilizations today which is likely due to overall decrease in cervical stiffness and improved mobility. Patient demonstrates good form with supine retractions today. Will reassess patient next session with most likely recert. Patient will continue to benefit from skilled physical therapy in order to reduce impairment and improve function.    Personal Factors and Comorbidities  Age;Behavior Pattern;Time since onset of injury/illness/exacerbation;Past/Current Experience;Profession    Examination-Activity Limitations  Bend;Sit    Examination-Participation Restrictions  Yard Work;Cleaning;Community Activity    Rehab Potential  Fair    PT Frequency  2x / week    PT Duration  4 weeks    PT Treatment/Interventions  ADLs/Self Care Home Management;Aquatic Therapy;Biofeedback;Cryotherapy;Electrical Stimulation;Iontophoresis 54m/ml Dexamethasone;Moist Heat;Traction;Ultrasound;DME Instruction;Gait training;Stair training;Functional mobility training;Therapeutic activities;Therapeutic exercise;Balance training;Orthotic Fit/Training;Patient/family education;Neuromuscular re-education;Manual techniques;Manual lymph drainage;Compression bandaging;Scar mobilization;Passive range of motion;Dry needling;Energy conservation;Splinting;Taping;Spinal Manipulations;Joint Manipulations    PT Next Visit Plan  continue with manual therapy with STM and mobs to ribs/ tspine and c/sp, assess for trigger points, continue postural strengthening,  PN/Recert next session    PT Home Exercise Plan  5/19 c/sp ret, scap ret 5/26 thoracic extension over chair    Consulted and Agree with Plan of Care  Patient       Patient will benefit from skilled therapeutic intervention in order to improve the following deficits and impairments:  Decreased activity tolerance, Decreased mobility, Decreased strength, Decreased endurance, Decreased range of motion, Hypomobility, Increased muscle spasms, Increased fascial restricitons, Improper body mechanics, Postural dysfunction, Pain  Visit Diagnosis: Pain in thoracic spine  Abnormal posture  Muscle weakness (generalized)     Problem List Patient Active Problem List   Diagnosis Date Noted  . Cough 12/14/2019  . History of COVID-19 12/10/2019  . Abnormal myocardial perfusion study   . Muscle spasm 02/25/2018  . Tubular adenoma of colon 07/19/2017  . H. pylori infection 07/19/2017  . Thoracic back pain 02/08/2017  . Hx of colonic polyps 12/03/2016  . Family history of colon cancer 12/03/2016  . Encounter for therapeutic drug monitoring 11/21/2016  . CAD (coronary artery disease) 09/12/2016  . PAF (paroxysmal atrial fibrillation) ([place]) 09/12/2016  . Syncope 09/12/2016  . DJD (degenerative joint disease) of cervical spine 09/12/2016  . Statin intolerance 09/12/2016  . TIA (transient ischemic attack) 09/12/2016  . Diverticulitis of colon 09/12/2016  . Myofascial pain syndrome 09/06/2016  . Takotsubo syndrome 09/06/2016  . HLD (hyperlipidemia) 09/06/2016  . Depression 09/06/2016  . Recurrent UTI (urinary tract infection) 09/06/2016    10:19 AM, 04/12/20 Mearl Latin PT, DPT Physical Therapist at [place], [name], [address] Phone: [phone]   Fax:  [phone]  Name: Heather [name] MRN: [id number] Date of Birth: April 23, 1946

## 2020-04-13 ENCOUNTER — Ambulatory Visit (HOSPITAL_COMMUNITY): Payer: Medicare Other | Admitting: Physical Therapy

## 2020-04-13 ENCOUNTER — Encounter (HOSPITAL_COMMUNITY): Payer: Self-pay | Admitting: Physical Therapy

## 2020-04-13 DIAGNOSIS — M546 Pain in thoracic spine: Secondary | ICD-10-CM

## 2020-04-13 DIAGNOSIS — M6281 Muscle weakness (generalized): Secondary | ICD-10-CM

## 2020-04-13 DIAGNOSIS — R293 Abnormal posture: Secondary | ICD-10-CM

## 2020-04-13 NOTE — Therapy (Signed)
[place], [name], [address] Phone: [phone]   Fax:  [phone]  Physical Therapy Treatment/ Progress note/Recert  Patient Details  Name: Heather [name] MRN: [id number] Date of Birth: 07-Dec-1945 Referring Provider (PT): Hurshel Party MD   Encounter Date: 04/13/2020  Progress Note   Reporting Period 02/2620 to 04/13/20   See note below for Objective Data and Assessment of Progress/Goals   PT End of Session - 04/13/20 1447    Visit Number  6    Number of Visits  10    Date for PT Re-Evaluation  05/11/20    Authorization Type  Primary: medicare secondary generic commercial    Progress Note Due on Visit  16    PT Start Time  5465    PT Stop Time  1515    PT Time Calculation (min)  38 min    Activity Tolerance  Patient tolerated treatment well    Behavior During Therapy  [facility] for tasks assessed/performed       Past Medical History:  Diagnosis Date  . Adrenal adenoma   . Allergy   . Anemia   . Anxiety   . CAD (coronary artery disease)    50-60% mid LAD at cardiac catheterization 2014 [facility])  . Cataract   . Depression   . DJD (degenerative joint disease) of cervical spine 09/12/2016  . History of cardiomyopathy   . History of tobacco use   . Hyperlipidemia   . Neuromuscular disorder ([place])   . PAF (paroxysmal atrial fibrillation) [facility])    Diagnosis July 2017 - spontaneously resolved  . Statin intolerance     Past Surgical History:  Procedure Laterality Date  . ABDOMINAL HYSTERECTOMY  1980   endometriosis  . CATARACT EXTRACTION Left 11/2018  . LEFT HEART CATH AND CORONARY ANGIOGRAPHY N/A 09/25/2018   Procedure: LEFT HEART CATH AND CORONARY ANGIOGRAPHY;  Surgeon: Belva Crome, MD;  Location: [place] [facility];  Service: Cardiovascular;  Laterality: N/A;  . TONSILLECTOMY  1954    There were no vitals filed for this visit.  Subjective Assessment - 04/13/20 1437    Subjective  Patient  states she was sore yesterday after our session. She states she was gardening today and it become sore so she put on topical cream to help. Things have been more manageable. She feels 80% improvement since beginning physical therapy.    Limitations  House hold activities;Other (comment);Writing   gardening   Patient Stated Goals  Eliminate Pain    Currently in Pain?  Yes    Pain Score  2    at worst   Pain Location  Back         [facility] PT Assessment - 04/13/20 0001      Assessment   Medical Diagnosis  Myofascial Pain Syndrome    Referring Provider (PT)  Hurshel Party MD    Onset Date/Surgical Date  03/07/10    Next MD Visit  few months    Prior Therapy  Yes      Precautions   Precautions  None      Restrictions   Weight Bearing Restrictions  No      Balance Screen   Has the patient fallen in the past 6 months  No    Has the patient had a decrease in activity level because of a fear of falling?   No    Is the patient reluctant to leave their home because of a fear  of falling?   No      Prior Function   Level of Independence  Independent    Vocation  Full time employment    Network engineer    Leisure  gardening      Cognition   Overall Cognitive Status  Within Functional Limits for tasks assessed      Observation/Other Assessments   Observations  ambulates without AD, tolerates sitting in chair      Strength   Right Hip Flexion  4+/5    Left Hip Flexion  4+/5    Right Knee Flexion  4/5    Right Knee Extension  5/5    Left Knee Flexion  4/5    Left Knee Extension  5/5    Right Ankle Dorsiflexion  5/5    Left Ankle Dorsiflexion  5/5                    [facility] Adult PT Treatment/Exercise - 04/13/20 0001      Neck Exercises: Theraband   Scapula Retraction  10 reps;Blue    Scapula Retraction Limitations  2 sets    Shoulder Extension  10 reps;Blue    Shoulder Extension Limitations  2 sets             PT Education - 04/13/20 1446     Education Details  Patient educated on progress made, recert, extending POC, goals met, lumbar roll, increasing activity, posture, continuing HEP, review of HEP.    Person(s) Educated  Patient    Methods  Explanation;Demonstration;Handout    Comprehension  Verbalized understanding;Returned demonstration       PT Short Term Goals - 04/13/20 1448      PT SHORT TERM GOAL #1   Title  Patient will be independent with HEP in order to improve functional outcomes.    Time  2    Period  Weeks    Status  Achieved    Target Date  03/30/20      PT SHORT TERM GOAL #2   Title  Patient will report at least 25% improvement in symptoms for improved quality of life.    Time  2    Period  Weeks    Status  Achieved    Target Date  03/30/20        PT Long Term Goals - 04/13/20 1448      PT LONG TERM GOAL #1   Title  Patient will report at least 75% improvement in symptoms for improved quality of life.    Time  4    Period  Weeks    Status  Achieved      PT LONG TERM GOAL #2   Title  Patient will be able to garden and use the computer without pain greater than 3/10 for improved tolerance to activity.    Time  4    Period  Weeks    Status  Achieved      PT LONG TERM GOAL #3   Title  Patient will be able to decrease her symptoms with HEP in order manage symptoms independently.    Time  4    Period  Weeks    Status  Achieved      PT LONG TERM GOAL #4   Title  Patient will be able to return all activities unresticted with no symptoms greater than 1/10 for at least one week.    Time  4    Period  Weeks  Status  New            Plan - 04/13/20 1448    Clinical Impression Statement  Patient has met all short and long term goals with improved symptoms, ability to complete HEP, and improved activity tolerance. One new goal is added for ability to return to all activities with decreased symptoms. Patient continues to tolerate postural strengthening exercises well with no increase in  symptoms. No manual therapy today completed secondary to tenderness at manual spot from yesterday. Patient continues to make good progress and have reduction in symptoms. Patient doing well to manage symptoms independently and is working toward discharge. Patient continues to be limited by symptoms with increase in activity.    Personal Factors and Comorbidities  Age;Behavior Pattern;Time since onset of injury/illness/exacerbation;Past/Current Experience;Profession    Examination-Activity Limitations  Bend;Sit    Examination-Participation Restrictions  Yard Work;Cleaning;Community Activity    Rehab Potential  Fair    PT Frequency  Biweekly    PT Duration  4 weeks    PT Treatment/Interventions  ADLs/Self Care Home Management;Aquatic Therapy;Biofeedback;Cryotherapy;Electrical Stimulation;Iontophoresis 57m/ml Dexamethasone;Moist Heat;Traction;Ultrasound;DME Instruction;Gait training;Stair training;Functional mobility training;Therapeutic activities;Therapeutic exercise;Balance training;Orthotic Fit/Training;Patient/family education;Neuromuscular re-education;Manual techniques;Manual lymph drainage;Compression bandaging;Scar mobilization;Passive range of motion;Dry needling;Energy conservation;Splinting;Taping;Spinal Manipulations;Joint Manipulations    PT Next Visit Plan  continue with manual therapy with STM and mobs to ribs/ tspine and c/sp, assess for trigger points, continue postural strengthening    PT Home Exercise Plan  5/19 c/sp ret, scap ret 5/26 thoracic extension over chair 6/2 Row, extensions    Consulted and Agree with Plan of Care  Patient       Patient will benefit from skilled therapeutic intervention in order to improve the following deficits and impairments:  Decreased activity tolerance, Decreased mobility, Decreased strength, Decreased endurance, Decreased range of motion, Hypomobility, Increased muscle spasms, Increased fascial restricitons, Improper body mechanics, Postural  dysfunction, Pain  Visit Diagnosis: Pain in thoracic spine  Abnormal posture  Muscle weakness (generalized)     Problem List Patient Active Problem List   Diagnosis Date Noted  . Cough 12/14/2019  . History of COVID-19 12/10/2019  . Abnormal myocardial perfusion study   . Muscle spasm 02/25/2018  . Tubular adenoma of colon 07/19/2017  . H. pylori infection 07/19/2017  . Thoracic back pain 02/08/2017  . Hx of colonic polyps 12/03/2016  . Family history of colon cancer 12/03/2016  . Encounter for therapeutic drug monitoring 11/21/2016  . CAD (coronary artery disease) 09/12/2016  . PAF (paroxysmal atrial fibrillation) ([place] 09/12/2016  . Syncope 09/12/2016  . DJD (degenerative joint disease) of cervical spine 09/12/2016  . Statin intolerance 09/12/2016  . TIA (transient ischemic attack) 09/12/2016  . Diverticulitis of colon 09/12/2016  . Myofascial pain syndrome 09/06/2016  . Takotsubo syndrome 09/06/2016  . HLD (hyperlipidemia) 09/06/2016  . Depression 09/06/2016  . Recurrent UTI (urinary tract infection) 09/06/2016    3:19 PM, 04/13/20 AMearl LatinPT, DPT Physical Therapist [place] [address] [address]Phone: [phone]  Fax:  [phone] Name: Heather MolonyMRN: [id number] of Birth: [birth year]

## 2020-04-18 ENCOUNTER — Encounter (HOSPITAL_COMMUNITY): Payer: Medicare Other | Admitting: Physical Therapy

## 2020-04-20 ENCOUNTER — Encounter: Payer: Self-pay | Admitting: Cardiology

## 2020-04-20 ENCOUNTER — Other Ambulatory Visit (INDEPENDENT_AMBULATORY_CARE_PROVIDER_SITE_OTHER): Payer: Self-pay | Admitting: Internal Medicine

## 2020-04-20 ENCOUNTER — Other Ambulatory Visit: Payer: Self-pay

## 2020-04-20 ENCOUNTER — Ambulatory Visit (INDEPENDENT_AMBULATORY_CARE_PROVIDER_SITE_OTHER): Payer: Medicare Other | Admitting: Cardiology

## 2020-04-20 VITALS — BP 130/66 | HR 55 | Ht 64.0 in | Wt 158.8 lb

## 2020-04-20 DIAGNOSIS — I480 Paroxysmal atrial fibrillation: Secondary | ICD-10-CM | POA: Diagnosis not present

## 2020-04-20 DIAGNOSIS — E782 Mixed hyperlipidemia: Secondary | ICD-10-CM | POA: Diagnosis not present

## 2020-04-20 DIAGNOSIS — I25119 Atherosclerotic heart disease of native coronary artery with unspecified angina pectoris: Secondary | ICD-10-CM

## 2020-04-20 DIAGNOSIS — Z789 Other specified health status: Secondary | ICD-10-CM

## 2020-04-20 NOTE — Patient Instructions (Signed)
Medication Instructions:  Your physician recommends that you continue on your current medications as directed. Please refer to the Current Medication list given to you today.  *If you need a refill on your cardiac medications before your next appointment, please call your pharmacy*   Lab Work: NONE   If you have labs (blood work) drawn today and your tests are completely normal, you will receive your results only by: . MyChart Message (if you have MyChart) OR . A paper copy in the mail If you have any lab test that is abnormal or we need to change your treatment, we will call you to review the results.   Testing/Procedures: NONE    Follow-Up: At [facility], you and your health needs are our priority.  As part of our continuing mission to provide you with exceptional heart care, we have created designated Provider Care Teams.  These Care Teams include your primary Cardiologist (physician) and Advanced Practice Providers (APPs -  Physician Assistants and Nurse Practitioners) who all work together to provide you with the care you need, when you need it.  We recommend signing up for the patient portal called "MyChart".  Sign up information is provided on this After Visit Summary.  MyChart is used to connect with patients for Virtual Visits (Telemedicine).  Patients are able to view lab/test results, encounter notes, upcoming appointments, etc.  Non-urgent messages can be sent to your provider as well.   To learn more about what you can do with MyChart, go to [email or web address].    Your next appointment:   6 month(s)  The format for your next appointment:   In Person  Provider:   Samuel McDowell, MD   Other Instructions Thank you for choosing [facility]!    

## 2020-04-20 NOTE — Progress Notes (Signed)
Cardiology Office Note  Date: 04/20/2020   ID: Heather [name], DOB Aug 06, 1946, MRN [id number]  PCP:  Doree Albee, MD  Cardiologist:  Rozann Lesches, MD Electrophysiologist:  None   Chief Complaint  Patient presents with  . Cardiac follow-up    History of Present Illness: Heather [name] is a 74 y.o. female last assessed via telehealth encounter in December 2020.  She presents for a routine visit.  She reports chronic fatigue but no recurring chest discomfort or use of nitroglycerin.  I reviewed her recent lab work as outlined below, LDL 217 with total cholesterol 308.  She has a history of statin intolerance and does not want to take Repatha.  Still focusing on diet, although we have discussed the fact that her lipid numbers are unlikely to come down to recommended ranges in the setting of cardiac disease without medical therapy.  I personally reviewed her ECG today which shows sinus bradycardia.  Past Medical History:  Diagnosis Date  . Adrenal adenoma   . Allergy   . Anemia   . Anxiety   . CAD (coronary artery disease)    50-60% mid LAD at cardiac catheterization 2014 [facility])  . Cataract   . Depression   . DJD (degenerative joint disease) of cervical spine 09/12/2016  . History of cardiomyopathy   . History of tobacco use   . Hyperlipidemia   . Neuromuscular disorder ([place])   . PAF (paroxysmal atrial fibrillation) [facility])    Diagnosis July 2017 - spontaneously resolved  . Statin intolerance     Past Surgical History:  Procedure Laterality Date  . ABDOMINAL HYSTERECTOMY  1980   endometriosis  . CATARACT EXTRACTION Left 11/2018  . LEFT HEART CATH AND CORONARY ANGIOGRAPHY N/A 09/25/2018   Procedure: LEFT HEART CATH AND CORONARY ANGIOGRAPHY;  Surgeon: Belva Crome, MD;  Location: [place] [facility];  Service: Cardiovascular;  Laterality: N/A;  . TONSILLECTOMY  1954    Current Outpatient Medications  Medication Sig Dispense Refill  . Biotin 10000 MCG TABS  Take 10,000 mcg by mouth daily.     Marland Kitchen buPROPion (WELLBUTRIN SR) 150 MG 12 hr tablet Take 1 tablet (150 mg total) by mouth 2 (two) times daily. 180 tablet 0  . Cholecalciferol (VITAMIN D3) 125 MCG (5000 UT) TABS Take 15,000 Units by mouth daily.     . Coenzyme Q10 100 MG capsule Take 100 mg by mouth daily.     . Cyanocobalamin (VITAMIN B-12) 2500 MCG SUBL Place 2,500 mcg under the tongue daily.    Marland Kitchen docusate sodium (COLACE) 100 MG capsule Take 100 mg by mouth 2 (two) times daily.     Marland Kitchen estradiol (ESTRACE) 2 MG tablet Take 1 tablet by mouth daily 30 tablet 4  . oxyCODONE-acetaminophen (PERCOCET/ROXICET) 5-325 MG tablet TAKE 1 TABLET DAILY AS NEEDED FOR SEVERE PAIN. 30 tablet 0  . progesterone (PROMETRIUM) 100 MG capsule Take 1 capsule (100 mg total) by mouth daily. 30 capsule 3  . progesterone (PROMETRIUM) 200 MG capsule Take 1 capsule (200 mg total) by mouth daily. 90 capsule 1  . Testosterone 20 % CREA Apply 1 application topically daily.     Marland Kitchen thyroid (NP THYROID) 120 MG tablet Take 1 tablet (120 mg total) by mouth daily. 90 tablet 0  . thyroid (NP THYROID) 60 MG tablet Take 1 tablet (60 mg total) by mouth daily before lunch. 30 tablet 3  . warfarin (COUMADIN) 5 MG tablet Take 2 tablets by mouth daily or as  directed. 180 tablet 1  . nitroGLYCERIN (NITROSTAT) 0.4 MG SL tablet Place 1 tablet (0.4 mg total) under the tongue every 5 (five) minutes as needed for chest pain. 25 tablet 3   No current facility-administered medications for this visit.   Allergies:  Statins and Sulfa antibiotics   ROS:   Feels palpitations occasionally, no syncope.  No claudication.  Physical Exam: VS:  BP 130/66   Pulse (!) 55   Ht 5\' 4"  (1.626 m)   Wt 158 lb 12.8 oz (72 kg)   LMP 07/08/1979 (Approximate)   SpO2 98%   BMI 27.26 kg/m , BMI Body mass index is 27.26 kg/m.  Wt Readings from Last 3 Encounters:  04/20/20 158 lb 12.8 oz (72 kg)  03/31/20 160 lb 3.2 oz (72.7 kg)  03/08/20 161 lb 8 oz (73.3 kg)     General: Patient appears comfortable at rest. HEENT: Conjunctiva and lids normal, wearing a mask. Neck: Supple, no elevated JVP or carotid bruits, no thyromegaly. Lungs: Clear to auscultation, nonlabored breathing at rest. Cardiac: Regular rate and rhythm, no S3 or significant systolic murmur, no pericardial rub. Extremities: No pitting edema, distal pulses 2+.  ECG:  An ECG dated 09/25/2018 was personally reviewed today and demonstrated:  Sinus bradycardia with decreased R wave progression.  Recent Labwork: 12/23/2019: ALT 11; AST 16; BUN 13; Creat 0.76; Potassium 4.9; Sodium 134; TSH 0.08     Component Value Date/Time   CHOL 308 (H) 03/18/2020 0814   TRIG 142 03/18/2020 0814   HDL 61 03/18/2020 0814   CHOLHDL 5.0 (H) 03/18/2020 0814   LDLCALC 217 (H) 03/18/2020 0814    Other Studies Reviewed Today:  Cardiac catheterization 09/25/2018:  Right dominant coronary anatomy  Short left main, widely patent  Ostial 25% LAD followed by a segmental 30 to 40% region of narrowing. One large diagonal branch arises from this region and is as large as the left anterior descending which has no significant focal obstruction.  Circumflex has proximal irregularity up to 30%. 2 large obtuse marginal branches are free of obstruction.  The right coronary artery contains a relatively short segmental 40 to 50% narrowing.  Left ventricular function is normal with EF greater than 55%. LVEDP is normal.  RECOMMENDATIONS:   Aggressive risk prevention: LDL less than 70, blood pressure 130/80 mmHg or less, hemoglobin A1c less than 7, 150 minutes or more of moderate physical activity per week, consider screening for sleep apnea, and smoking cessation if appropriate.  Consider aspirin although will be at increased bleeding risk when used with Coumadin therapy.  Assessment and Plan:  1.  Paroxysmal atrial fibrillation.  CHA2DS2-VASc score is 3-4.  She continues on Coumadin, checks INR from home,  reports recently in therapeutic range.  She does not report any bleeding problems.  2.  Mild to moderate nonobstructive CAD by cardiac catheterization in 2019.  She does not report any active angina, has nitroglycerin available as needed.  She is not on aspirin given use of Coumadin.  She has a history of statin intolerance and does not want to take any specific medical therapy including Repatha.  We continue to discuss this over time.  Medication Adjustments/Labs and Tests Ordered: Current medicines are reviewed at length with the patient today.  Concerns regarding medicines are outlined above.   Tests Ordered: Orders Placed This Encounter  Procedures  . EKG 12-Lead    Medication Changes: No orders of the defined types were placed in this encounter.   Disposition:  Follow up 6 months in the [facility] office.  Signed, Satira Sark, MD, [facility] 04/20/2020 2:27 PM    [facility] [address] [place],  [address] Phone: [phone]; Fax: [phone]

## 2020-04-21 ENCOUNTER — Encounter (HOSPITAL_COMMUNITY): Payer: Medicare Other | Admitting: Physical Therapy

## 2020-04-24 DIAGNOSIS — H353132 Nonexudative age-related macular degeneration, bilateral, intermediate dry stage: Secondary | ICD-10-CM | POA: Diagnosis not present

## 2020-04-26 ENCOUNTER — Other Ambulatory Visit: Payer: Self-pay

## 2020-04-26 ENCOUNTER — Ambulatory Visit (HOSPITAL_COMMUNITY): Payer: Medicare Other | Admitting: Physical Therapy

## 2020-04-26 ENCOUNTER — Encounter (HOSPITAL_COMMUNITY): Payer: Self-pay | Admitting: Physical Therapy

## 2020-04-26 DIAGNOSIS — M6281 Muscle weakness (generalized): Secondary | ICD-10-CM

## 2020-04-26 DIAGNOSIS — R293 Abnormal posture: Secondary | ICD-10-CM

## 2020-04-26 DIAGNOSIS — M546 Pain in thoracic spine: Secondary | ICD-10-CM | POA: Diagnosis not present

## 2020-04-26 NOTE — Therapy (Addendum)
[facility] [address] [place], [name], [address] Phone: [phone]   Fax:  [phone]  Physical Therapy Treatment/Discharge Summary  Patient Details  Name: Heather [name] MRN: [id number] Date of Birth: 09/02/1946 Referring Provider (PT): Hurshel Party MD   Encounter Date: 04/26/2020   PHYSICAL THERAPY DISCHARGE SUMMARY  Visits from Start of Care: 7  Current functional level related to goals / functional outcomes: See below   Remaining deficits: See below   Education / Equipment: See below  Plan: Patient agrees to discharge.  Patient goals were met. Patient is being discharged due to meeting the stated rehab goals.  ?????        PT End of Session - 04/26/20 1347    Visit Number 7    Number of Visits 10    Date for PT Re-Evaluation 05/11/20    Authorization Type Primary: medicare secondary generic commercial    Progress Note Due on Visit 16    PT Start Time 1307    PT Stop Time 1338    PT Time Calculation (min) 31 min    Activity Tolerance Patient tolerated treatment well    Behavior During Therapy WFL for tasks assessed/performed           Past Medical History:  Diagnosis Date  . Adrenal adenoma   . Allergy   . Anemia   . Anxiety   . CAD (coronary artery disease)    50-60% mid LAD at cardiac catheterization 2014 [facility])  . Cataract   . Depression   . DJD (degenerative joint disease) of cervical spine 09/12/2016  . History of cardiomyopathy   . History of tobacco use   . Hyperlipidemia   . Neuromuscular disorder ([place])   . PAF (paroxysmal atrial fibrillation) [facility])    Diagnosis July 2017 - spontaneously resolved  . Statin intolerance     Past Surgical History:  Procedure Laterality Date  . ABDOMINAL HYSTERECTOMY  1980   endometriosis  . CATARACT EXTRACTION Left 11/2018  . LEFT HEART CATH AND CORONARY ANGIOGRAPHY N/A 09/25/2018   Procedure: LEFT HEART CATH AND CORONARY ANGIOGRAPHY;  Surgeon:  Belva Crome, MD;  Location: [place] [facility];  Service: Cardiovascular;  Laterality: N/A;  . TONSILLECTOMY  1954    There were no vitals filed for this visit.   Subjective Assessment - 04/26/20 1306    Subjective Patient reports she has not had much pain in her back. She has used the heat several times. She has not been above a 2-3/10 in last several weeks and symptoms are minimal if present at all. She has intermittent symptoms into R shoulder and UE and weakness. Doing random things with R arm bring on the symptoms. She is feeling 90% better since beginning physical therapy intervention.    Limitations House hold activities;Other (comment);Writing   gardening   Patient Stated Goals Eliminate Pain              [facility] PT Assessment - 04/26/20 0001      Assessment   Medical Diagnosis Myofascial Pain Syndrome    Referring Provider (PT) Hurshel Party MD    Onset Date/Surgical Date 03/07/10    Next MD Visit few months    Prior Therapy Yes      Precautions   Precautions None      Restrictions   Weight Bearing Restrictions No      Balance Screen   Has the patient fallen in the past 6 months No  Has the patient had a decrease in activity level because of a fear of falling?  No    Is the patient reluctant to leave their home because of a fear of falling?  No      Prior Function   Level of Independence Independent    Vocation Full time employment    Tax inspector    Leisure gardening      Cognition   Overall Cognitive Status Within Functional Limits for tasks assessed      Observation/Other Assessments   Observations ambulates without AD, tolerates sitting in chair      Palpation   Spinal mobility tender R UPA throughout cervical spine, no reproduction of symptoms    Palpation comment Tender R supraspinatus muscle belly and tendon                                 PT Education - 04/26/20 1345    Education Details Patient  educated on progress made, cervical pathology, rotator cuff pathology, shoulder impingement, posural strengtheing, goals met, returning to physical therapy if needed.    Person(s) Educated Patient    Methods Explanation;Demonstration    Comprehension Verbalized understanding;Returned demonstration            PT Short Term Goals - 04/13/20 1448      PT SHORT TERM GOAL #1   Title Patient will be independent with HEP in order to improve functional outcomes.    Time 2    Period Weeks    Status Achieved    Target Date 03/30/20      PT SHORT TERM GOAL #2   Title Patient will report at least 25% improvement in symptoms for improved quality of life.    Time 2    Period Weeks    Status Achieved    Target Date 03/30/20             PT Long Term Goals - 04/26/20 1316      PT LONG TERM GOAL #1   Title Patient will report at least 75% improvement in symptoms for improved quality of life.    Time 4    Period Weeks    Status Achieved      PT LONG TERM GOAL #2   Title Patient will be able to garden and use the computer without pain greater than 3/10 for improved tolerance to activity.    Time 4    Period Weeks    Status Achieved      PT LONG TERM GOAL #3   Title Patient will be able to decrease her symptoms with HEP in order manage symptoms independently.    Time 4    Period Weeks    Status Achieved      PT LONG TERM GOAL #4   Title Patient will be able to return all activities unresticted with no symptoms greater than 1/10 for at least one week.    Time 4    Period Weeks    Status Achieved                 Plan - 04/26/20 1347    Clinical Impression Statement Patient has met all short and long term goals. She has had reduction in chronic symptoms in thoracic/rib region with cervical mobilizations at C6-C7 region. Patient able to manage current symptoms with HEP. Patient has had some intermittent shoulder symptoms recently which she attributed to her  neck but on  further assessment today she may have acute shoulder pathology and she is educated on monitoring symptoms and following up if needed. Patient educated on progress made, continuing HEP, cervical and shoulder pathology, postural strengthening, posture, remaining activity, continuing to increase activity, and returning to physical therapy if needed. Patient discharged from physical therapy at this time.    Personal Factors and Comorbidities Age;Behavior Pattern;Time since onset of injury/illness/exacerbation;Past/Current Experience;Profession    Examination-Activity Limitations [place];Sit    Examination-Participation Restrictions Yard Work;Cleaning;Community Activity    Rehab Potential Fair    PT Frequency --    PT Duration --    PT Treatment/Interventions ADLs/Self Care Home Management;Aquatic Therapy;Biofeedback;Cryotherapy;Electrical Stimulation;Iontophoresis 68m/ml Dexamethasone;Moist Heat;Traction;Ultrasound;DME Instruction;Gait training;Stair training;Functional mobility training;Therapeutic activities;Therapeutic exercise;Balance training;Orthotic Fit/Training;Patient/family education;Neuromuscular re-education;Manual techniques;Manual lymph drainage;Compression bandaging;Scar mobilization;Passive range of motion;Dry needling;Energy conservation;Splinting;Taping;Spinal Manipulations;Joint Manipulations    PT Next Visit Plan n/a    PT Home Exercise Plan 5/19 c/sp ret, scap ret 5/26 thoracic extension over chair 6/2 Row, extensions    Consulted and Agree with Plan of Care Patient           Patient will benefit from skilled therapeutic intervention in order to improve the following deficits and impairments:  Decreased activity tolerance, Decreased mobility, Decreased strength, Decreased endurance, Decreased range of motion, Hypomobility, Increased muscle spasms, Increased fascial restricitons, Improper body mechanics, Postural dysfunction, Pain  Visit Diagnosis: Pain in thoracic spine  Abnormal  posture  Muscle weakness (generalized)     Problem List Patient Active Problem List   Diagnosis Date Noted  . Cough 12/14/2019  . History of COVID-19 12/10/2019  . Abnormal myocardial perfusion study   . Muscle spasm 02/25/2018  . Tubular adenoma of colon 07/19/2017  . H. pylori infection 07/19/2017  . Thoracic back pain 02/08/2017  . Hx of colonic polyps 12/03/2016  . Family history of colon cancer 12/03/2016  . Encounter for therapeutic drug monitoring 11/21/2016  . CAD (coronary artery disease) 09/12/2016  . [place]  . Syncope 09/12/2016  . DJD (degenerative joint disease) of cervical spine 09/12/2016  . Statin intolerance 09/12/2016  . TIA (transient ischemic attack) 09/12/2016  . Diverticulitis of colon 09/12/2016  . Myofascial pain syndrome 09/06/2016  . Takotsubo syndrome 09/06/2016  . HLD (hyperlipidemia) 09/06/2016  . Depression 09/06/2016  . Recurrent UTI (urinary tract infection) 09/06/2016    1:58 PM, 04/26/20 AMearl LatinPT, DPT [place] Therap[place] [id number]: [phone]  Fax:  [phone] Name: Heather McgrawMRN: [id number] of Birth: [birth year]

## 2020-04-29 ENCOUNTER — Other Ambulatory Visit: Payer: Self-pay

## 2020-04-29 ENCOUNTER — Ambulatory Visit (HOSPITAL_COMMUNITY)
Admission: RE | Admit: 2020-04-29 | Discharge: 2020-04-29 | Disposition: A | Discharge status: Home / Self Care | Payer: Medicare Other | Source: Ambulatory Visit | Attending: Internal Medicine | Admitting: Internal Medicine

## 2020-04-29 DIAGNOSIS — Z1231 Encounter for screening mammogram for malignant neoplasm of breast: Secondary | ICD-10-CM | POA: Diagnosis present

## 2020-04-29 IMAGING — MG DIGITAL SCREENING BILAT W/ TOMO W/ CAD
8 series · 8 of 24 positions shown · non-contrast
Comparison: Previous exam(s).

CLINICAL DATA: Screening.

EXAM:
DIGITAL SCREENING BILATERAL MAMMOGRAM WITH TOMO AND CAD

[R CC synth-2D]
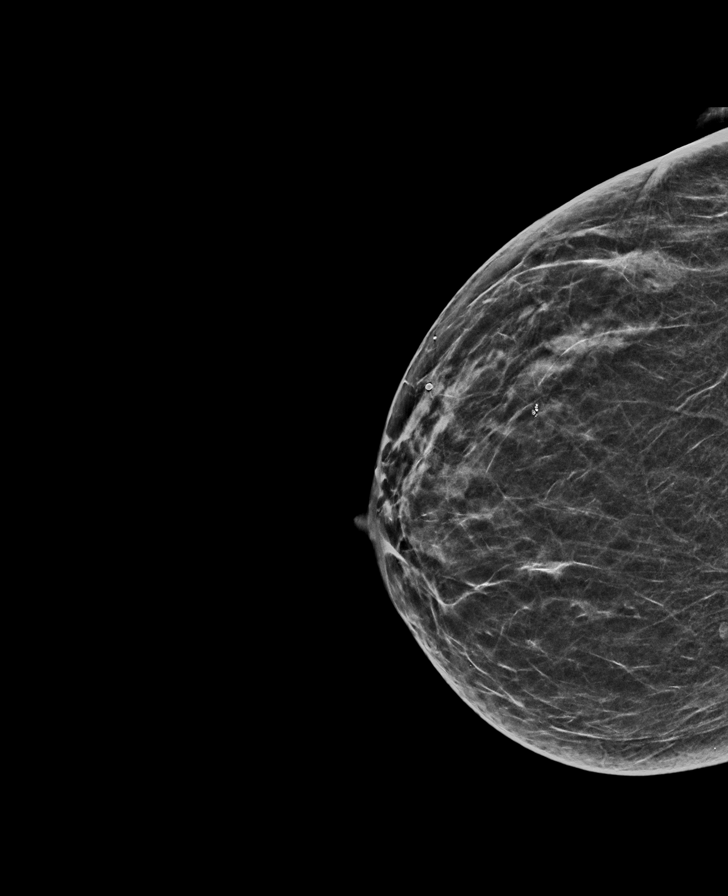

[L CC synth-2D]
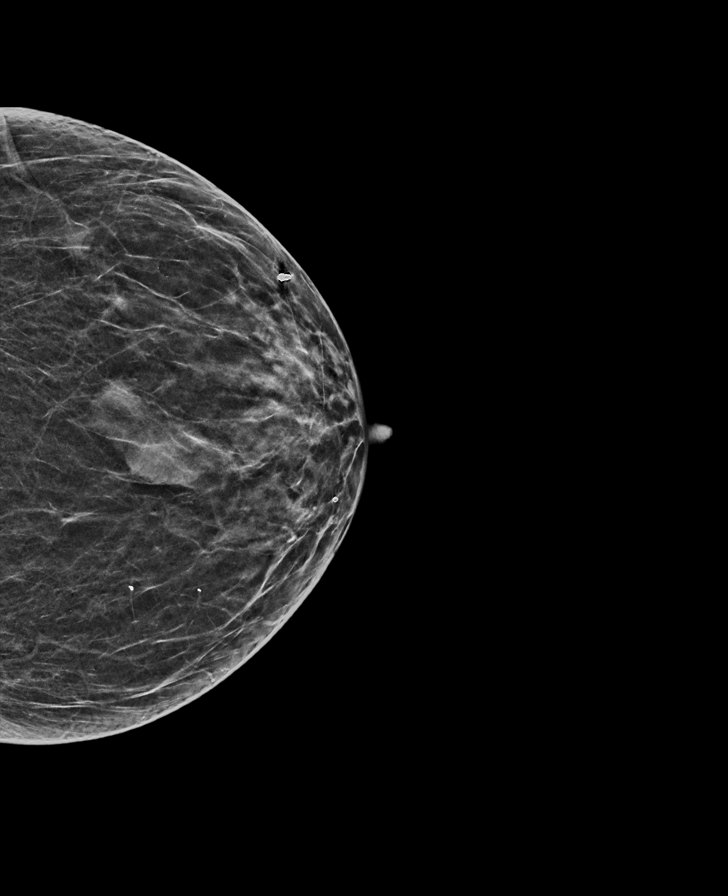

[L MLO synth-2D]
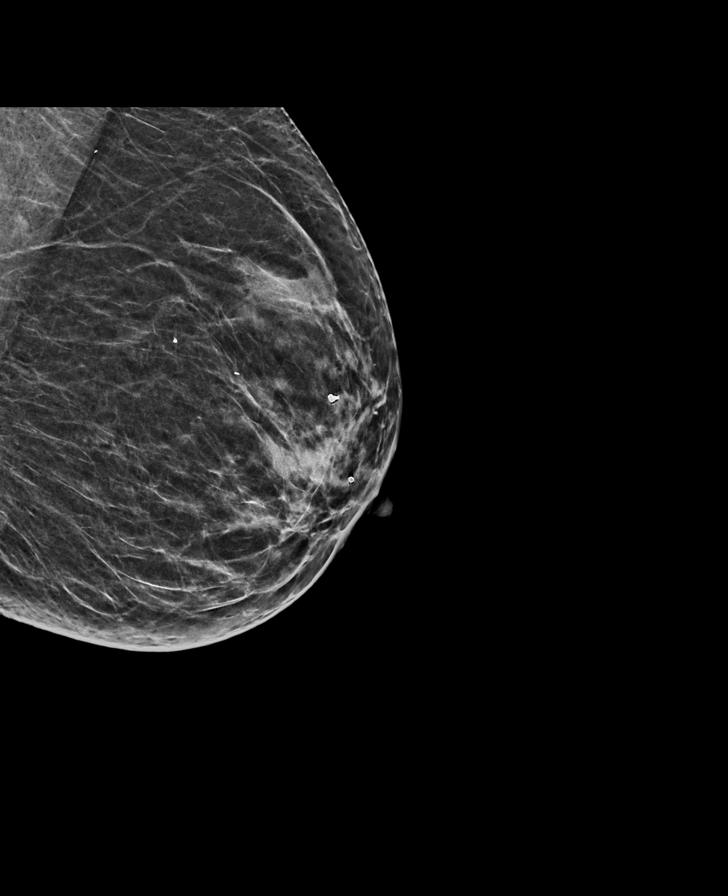

[R MLO synth-2D]
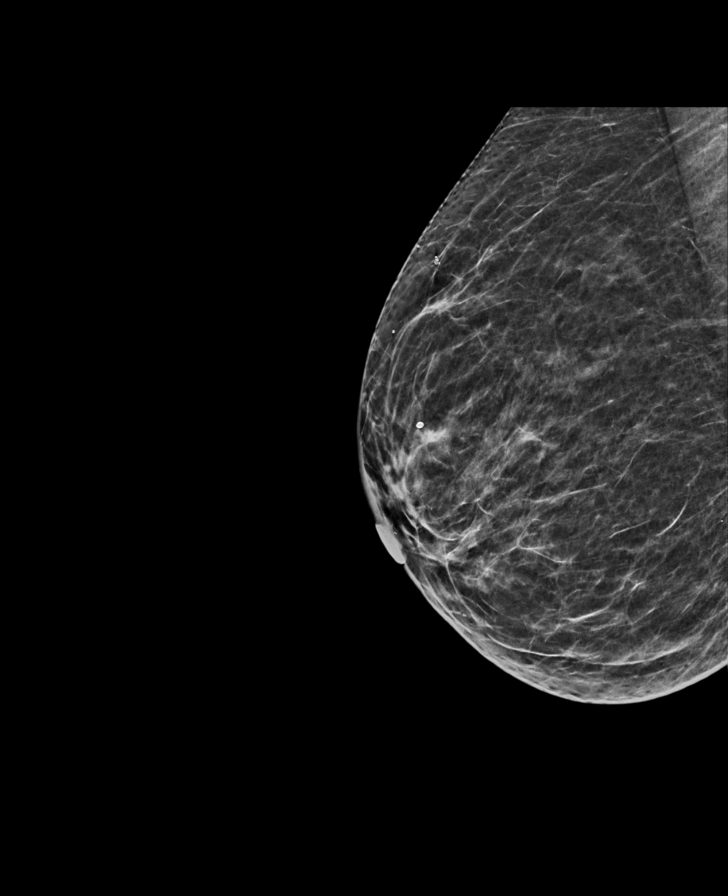

[L CC tomo · tomo slice 26/51.0]
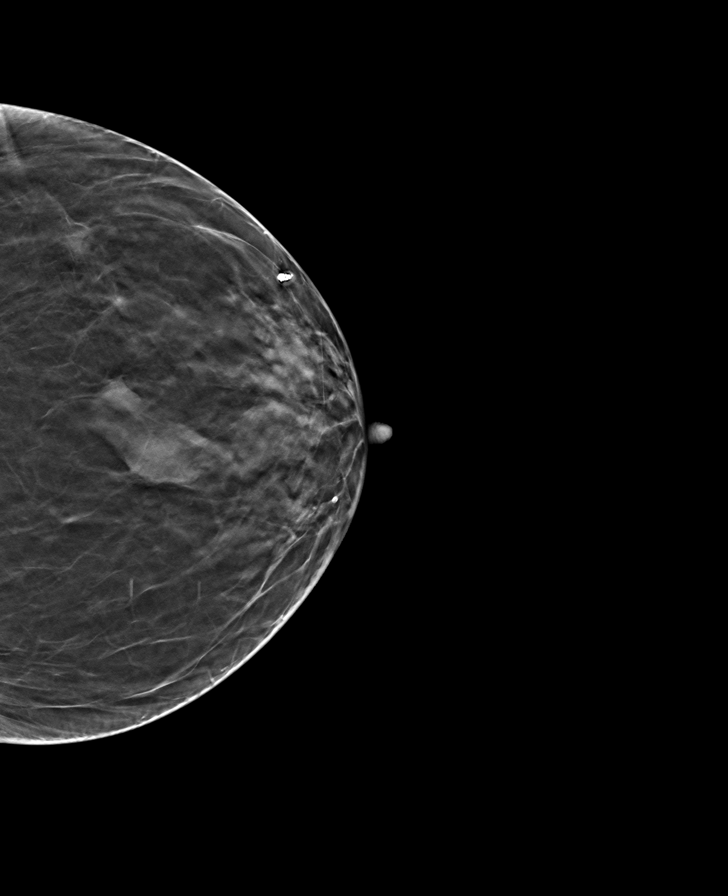

[L MLO tomo · tomo slice 28/55.0]
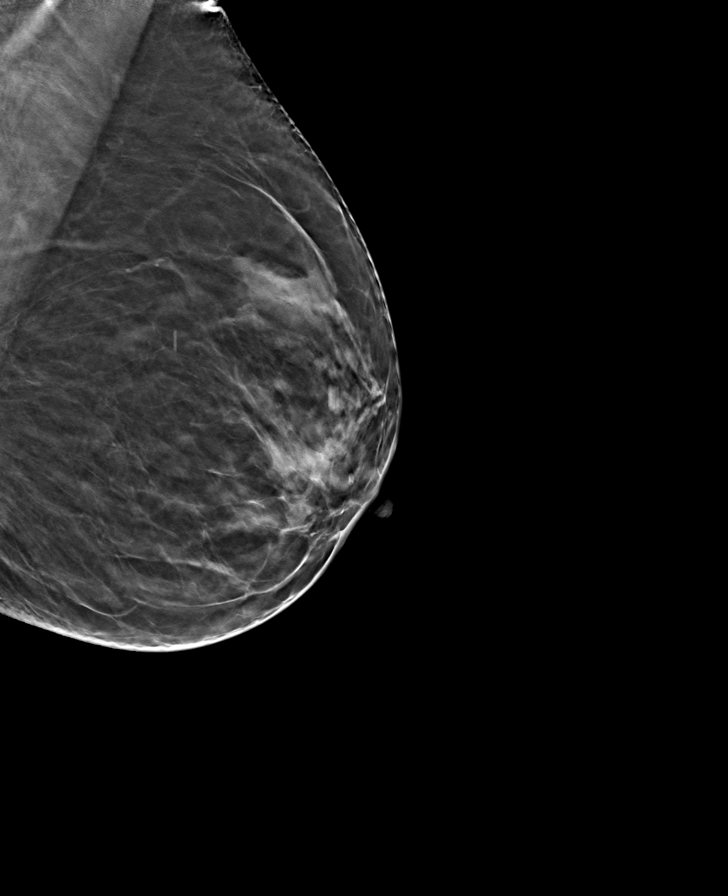

[R CC tomo · tomo slice 28/55.0]
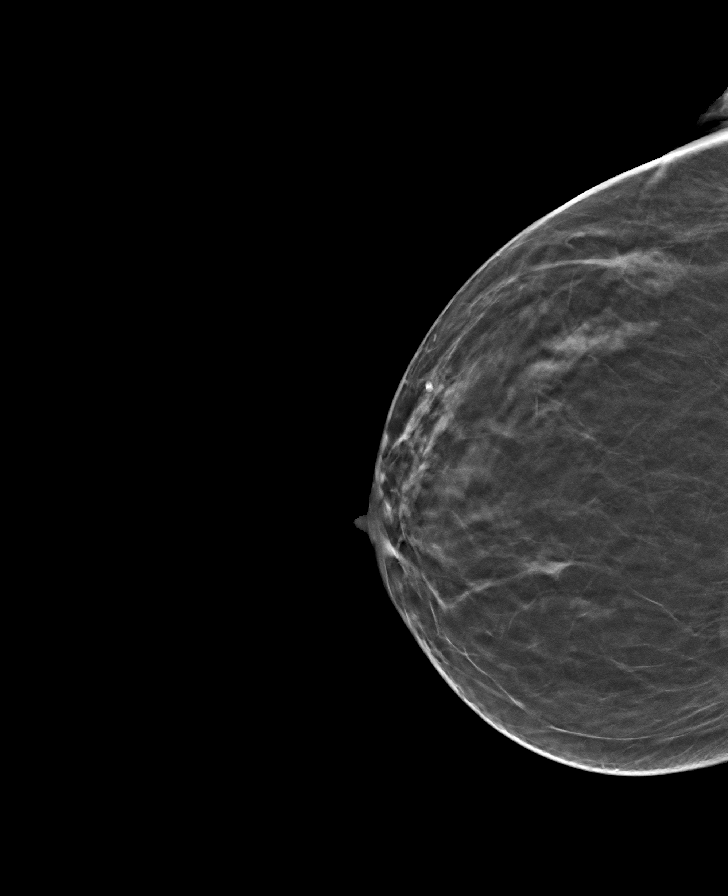

[R MLO tomo · tomo slice 27/53.0]
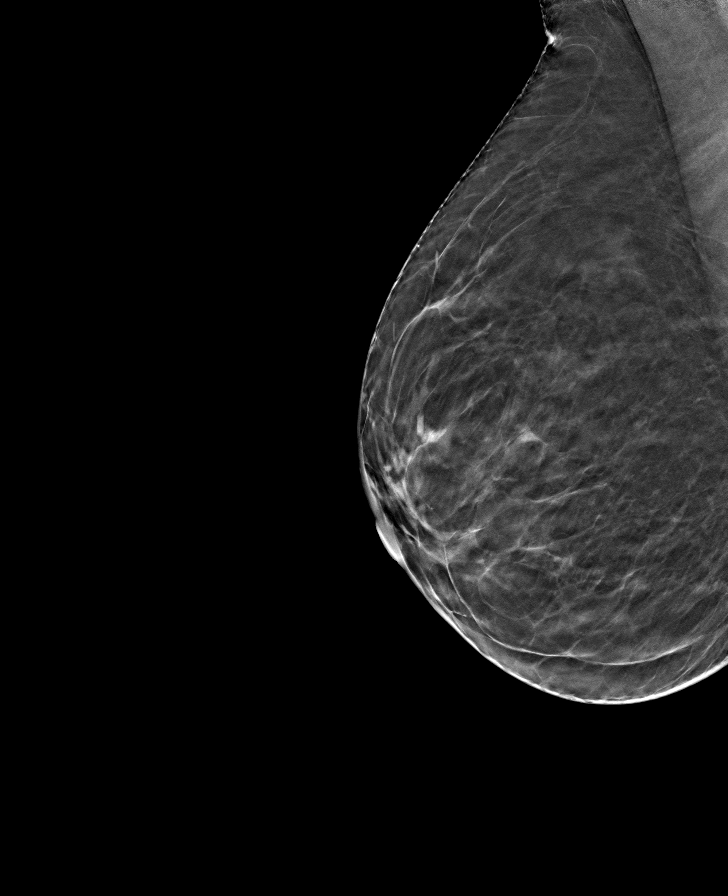

[8 of 24 positions shown; findings below may reference images not displayed]

ACR Breast Density Category b: There are scattered areas of
fibroglandular density.
FINDINGS: There are no findings suspicious for malignancy. Images were
processed with CAD.
IMPRESSION: No mammographic evidence of malignancy. A result letter of this
screening mammogram will be mailed directly to the patient.

RECOMMENDATION:
Screening mammogram in one year. (Code:[TQ])

BI-RADS CATEGORY  1: Negative.

## 2020-05-03 ENCOUNTER — Ambulatory Visit (INDEPENDENT_AMBULATORY_CARE_PROVIDER_SITE_OTHER): Payer: Medicare Other | Admitting: *Deleted

## 2020-05-03 DIAGNOSIS — Z5181 Encounter for therapeutic drug level monitoring: Secondary | ICD-10-CM

## 2020-05-03 DIAGNOSIS — I480 Paroxysmal atrial fibrillation: Secondary | ICD-10-CM | POA: Diagnosis not present

## 2020-05-03 LAB — POCT INR: INR: 1.3 — AB (ref 2.0–3.0)

## 2020-05-03 NOTE — Patient Instructions (Signed)
INR was 5.6 on 6/18.  Pt held warfarin x 2 days on her own.  Today 1.3  (See previous note) Resume warfarin 2 tablets daily except 1 tablet on Mondays and Fridays.   Recheck in 1 week (self tester) and call results Continue greens Sent pt message per my chart

## 2020-05-10 ENCOUNTER — Ambulatory Visit (INDEPENDENT_AMBULATORY_CARE_PROVIDER_SITE_OTHER): Payer: Medicare Other | Admitting: *Deleted

## 2020-05-10 ENCOUNTER — Encounter (HOSPITAL_COMMUNITY): Payer: Self-pay | Admitting: Physical Therapy

## 2020-05-10 DIAGNOSIS — Z5181 Encounter for therapeutic drug level monitoring: Secondary | ICD-10-CM | POA: Diagnosis not present

## 2020-05-10 DIAGNOSIS — I480 Paroxysmal atrial fibrillation: Secondary | ICD-10-CM

## 2020-05-10 LAB — POCT INR: INR: 3.5 — AB (ref 2.0–3.0)

## 2020-05-10 NOTE — Patient Instructions (Signed)
Take warfarin 1 tablet tonight then decrease dose to 2 tablets daily except 1 tablet on Mondays, Wednesdays and Fridays Recheck in 3 weeks (self tester) and call results Continue greens Sent pt message per my chart

## 2020-05-18 ENCOUNTER — Other Ambulatory Visit: Payer: Self-pay | Admitting: Neurology

## 2020-05-18 MED ORDER — OXYCODONE-ACETAMINOPHEN 5-325 MG PO TABS: ORAL_TABLET | ORAL | 0 refills | Status: DC

## 2020-05-18 NOTE — Telephone Encounter (Signed)
Pt is requesting a refill for oxyCODONE-acetaminophen (PERCOCET/ROXICET) 5-325 MG tablet.  Pharmacy: [facility] (Noted by Shanda Howells.)

## 2020-05-20 DIAGNOSIS — R5381 Other malaise: Secondary | ICD-10-CM | POA: Diagnosis not present

## 2020-05-20 DIAGNOSIS — E785 Hyperlipidemia, unspecified: Secondary | ICD-10-CM | POA: Diagnosis not present

## 2020-05-20 DIAGNOSIS — E2839 Other primary ovarian failure: Secondary | ICD-10-CM | POA: Diagnosis not present

## 2020-05-20 DIAGNOSIS — R5383 Other fatigue: Secondary | ICD-10-CM | POA: Diagnosis not present

## 2020-05-21 LAB — T3, FREE: T3, Free: 5 pg/mL — ABNORMAL HIGH (ref 2.3–4.2)

## 2020-05-21 LAB — PROGESTERONE: Progesterone: 6.6 ng/mL

## 2020-05-24 ENCOUNTER — Ambulatory Visit (INDEPENDENT_AMBULATORY_CARE_PROVIDER_SITE_OTHER): Payer: Medicare Other | Admitting: *Deleted

## 2020-05-24 ENCOUNTER — Telehealth: Payer: Self-pay

## 2020-05-24 DIAGNOSIS — Z5181 Encounter for therapeutic drug level monitoring: Secondary | ICD-10-CM | POA: Diagnosis not present

## 2020-05-24 DIAGNOSIS — I480 Paroxysmal atrial fibrillation: Secondary | ICD-10-CM | POA: Diagnosis not present

## 2020-05-24 LAB — POCT INR: INR: 4.1 — AB (ref 2.0–3.0)

## 2020-05-24 NOTE — Telephone Encounter (Signed)
See coumadin clinic note

## 2020-05-24 NOTE — Patient Instructions (Signed)
Checked INR at home this morning.  Walked into [place] office to discuss results and show nurse bruising.  Discussed over the phone with Rondel Jumbo RN.  Has bruise on forearm and thumb.  No hematoma.  Order given to:  Hold warfarin tonight then decrease dose to 1 tablet daily except 2 tablets on Mondays and Thursdays Recheck on Monday 7/19 at [facility] lab.  Pt refused to come to [place] office.  Order put into [facility]

## 2020-05-25 ENCOUNTER — Ambulatory Visit: Payer: Medicare Other | Admitting: Neurology

## 2020-05-26 ENCOUNTER — Ambulatory Visit (INDEPENDENT_AMBULATORY_CARE_PROVIDER_SITE_OTHER): Payer: Medicare Other | Admitting: Internal Medicine

## 2020-05-26 ENCOUNTER — Other Ambulatory Visit: Payer: Self-pay

## 2020-05-26 ENCOUNTER — Encounter (INDEPENDENT_AMBULATORY_CARE_PROVIDER_SITE_OTHER): Payer: Self-pay | Admitting: Internal Medicine

## 2020-05-26 VITALS — BP 115/58 | HR 59 | Temp 98.2°F | Ht 64.0 in | Wt 157.8 lb

## 2020-05-26 DIAGNOSIS — E2839 Other primary ovarian failure: Secondary | ICD-10-CM | POA: Diagnosis not present

## 2020-05-26 DIAGNOSIS — W57XXXA Bitten or stung by nonvenomous insect and other nonvenomous arthropods, initial encounter: Secondary | ICD-10-CM | POA: Diagnosis not present

## 2020-05-26 DIAGNOSIS — I25119 Atherosclerotic heart disease of native coronary artery with unspecified angina pectoris: Secondary | ICD-10-CM | POA: Diagnosis not present

## 2020-05-26 DIAGNOSIS — E785 Hyperlipidemia, unspecified: Secondary | ICD-10-CM

## 2020-05-26 NOTE — Progress Notes (Signed)
Metrics: Intervention Frequency ACO  Documented Smoking Status Yearly  Screened one or more times in 24 months  Cessation Counseling or  Active cessation medication Past 24 months  Past 24 months   Guideline developer: UpToDate (See UpToDate for funding source) Date Released: 2014       Wellness Office Visit  Subjective:  Patient ID: Heather [name], female    DOB: [birth year]  Age: 74 y.o. MRN: [id number]  CC: This lady comes in for follow-up of menopausal symptoms and hyperlipidemia. HPI She also takes desiccated NP thyroid, off label, for symptoms of thyroid deficiency. She tells me that she had tick bite about 2 weeks ago.  Fortunately, she does not appear to have systemic symptoms but she is concerned about Lyme disease.  The tick bite did not cause much inflammation in the skin. She has tolerated the higher dose of NP thyroid that we initiated last visit. Unfortunately, she could not tolerate the higher dose of progesterone and so she is only taking progesterone 200 mg at night.  Fortunately, she does not have a uterus so we are not worried about uterine cancer.  She continues to take estradiol 2 mg daily. She takes testosterone cream 2-3 times a week.  Past Medical History:  Diagnosis Date  . Adrenal adenoma   . Allergy   . Anemia   . Anxiety   . CAD (coronary artery disease)    50-60% mid LAD at cardiac catheterization 2014 [facility])  . Cataract   . Depression   . DJD (degenerative joint disease) of cervical spine 09/12/2016  . History of cardiomyopathy   . History of tobacco use   . Hyperlipidemia   . Neuromuscular disorder ([place])   . PAF (paroxysmal atrial fibrillation) [facility])    Diagnosis July 2017 - spontaneously resolved  . Statin intolerance    Past Surgical History:  Procedure Laterality Date  . ABDOMINAL HYSTERECTOMY  1980   endometriosis  . CATARACT EXTRACTION Left 11/2018  . LEFT HEART CATH AND CORONARY ANGIOGRAPHY N/A 09/25/2018   Procedure: LEFT HEART  CATH AND CORONARY ANGIOGRAPHY;  Surgeon: Belva Crome, MD;  Location: [place] [facility];  Service: Cardiovascular;  Laterality: N/A;  . TONSILLECTOMY  1954     Family History  Problem Relation Age of Onset  . COPD Mother   . Hearing loss Mother   . Stroke Mother   . Heart disease Mother        chf, died at 78  . Arthritis Father   . Hypertension Father   . Heart disease Father        cad, pvd, died at 34  . Heart disease Sister        heart valve  . Arthritis Sister   . Hyperlipidemia Brother   . Hypertension Brother   . 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 Smoking status: Former Smoker    Packs/day: 0.00    Types: Cigarettes    Start date: 11/12/1965    Quit date: 09/06/2008    Years since quitting: 11.7  . Smokeless tobacco: Never Used  Substance Use Topics  . Alcohol use: Yes    Alcohol/week: 1.0 standard drink    Types: 1 Glasses of wine per week    Comment: occ  Current Meds  Medication Sig  . Biotin 10000 MCG TABS Take 10,000 mcg by mouth daily.   Marland Kitchen buPROPion (WELLBUTRIN SR) 150 MG 12 hr tablet Take 1 tablet (150 mg total) by mouth 2 (two) times daily.  . Cholecalciferol (VITAMIN D3) 125 MCG (5000 UT) TABS Take 15,000 Units by mouth daily.   . Coenzyme Q10 100 MG capsule Take 100 mg by mouth daily.   . Cyanocobalamin (VITAMIN B-12) 2500 MCG SUBL Place 2,500 mcg under the tongue daily.  Marland Kitchen docusate sodium (COLACE) 100 MG capsule Take 100 mg by mouth 2 (two) times daily.   Marland Kitchen estradiol (ESTRACE) 2 MG tablet Take 1 tablet by mouth daily  . nitroGLYCERIN (NITROSTAT) 0.4 MG SL tablet Place 1 tablet (0.4 mg total) under the tongue every 5 (five) minutes as needed for chest pain.  Marland Kitchen oxyCODONE-acetaminophen (PERCOCET/ROXICET) 5-325 MG tablet TAKE 1 TABLET DAILY AS NEEDED FOR SEVERE PAIN.  Marland Kitchen progesterone  (PROMETRIUM) 200 MG capsule Take 1 capsule by mouth daily.  . Testosterone 20 % CREA Apply 1 application topically daily.   Marland Kitchen thyroid (NP THYROID) 120 MG tablet Take 1 tablet (120 mg total) by mouth daily.  Marland Kitchen thyroid (NP THYROID) 60 MG tablet Take 1 tablet (60 mg total) by mouth daily before lunch.  . warfarin (COUMADIN) 5 MG tablet Take 2 tablets by mouth daily or as directed.       Depression screen [facility] 2/9 03/31/2020 12/14/2019 12/16/2017 09/24/2017 03/07/2017  Decreased Interest 0 0 0 0 0  Down, Depressed, Hopeless 0 0 0 0 0  PHQ - 2 Score 0 0 0 0 0     Objective:   Today's Vitals: BP (!) 115/58 (BP Location: Left Arm, Patient Position: Sitting, Cuff Size: Normal)   Pulse (!) 59   Temp 98.2 F (36.8 C) (Temporal)   Ht 5\' 4"  (1.626 m)   Wt 157 lb 12.8 oz (71.6 kg)   LMP 07/08/1979 (Approximate)   SpO2 98%   BMI 27.09 kg/m  Vitals with BMI 05/26/2020 04/20/2020 03/31/2020  Height 5\' 4"  5\' 4"  5\' 4"   Weight 157 lbs 13 oz 158 lbs 13 oz 160 lbs 3 oz  BMI 27.07 26.71 24.58  Systolic 099 833 825  Diastolic 58 66 50  Pulse 59 55 60     Physical Exam   He looks systemically well.  Her weight is stable and she  would like to lose further weight.    Assessment   1. Tick bite, initial encounter   2. Primary ovarian failure   3. Hyperlipidemia, unspecified hyperlipidemia type       Tests ordered Orders Placed This Encounter  Procedures  . B. burgdorfi Antibody     Plan: 1. Blood will be taken for Lyme antibody test. 2. She will continue with current dose of progesterone even though the level is not optimal. 3. She will continue with desiccated NP thyroid, T3 levels are in a good range now. 4. She will continue to work on diet and we discussed a plant-based diet as well as seafood.  We also discussed a 5-day fasting mimicking diet involving complex carbohydrates and healthy fats. 5. Follow-up in 3 months.   No orders of the defined types were placed in this  encounter.   Doree Albee, MD

## 2020-06-01 LAB — B. BURGDORFI ANTIBODIES: B burgdorferi Ab IgG+IgM: <0.9 index

## 2020-06-04 ENCOUNTER — Encounter (INDEPENDENT_AMBULATORY_CARE_PROVIDER_SITE_OTHER): Payer: Self-pay | Admitting: Internal Medicine

## 2020-06-05 ENCOUNTER — Other Ambulatory Visit: Payer: Self-pay

## 2020-06-05 ENCOUNTER — Emergency Department (HOSPITAL_COMMUNITY)
Admission: EM | Admit: 2020-06-05 | Discharge: 2020-06-05 | Disposition: A | Discharge status: Home / Self Care | Payer: Medicare Other | Attending: Emergency Medicine | Admitting: Emergency Medicine

## 2020-06-05 ENCOUNTER — Emergency Department (HOSPITAL_COMMUNITY): Payer: Medicare Other

## 2020-06-05 ENCOUNTER — Encounter (HOSPITAL_COMMUNITY): Payer: Self-pay

## 2020-06-05 DIAGNOSIS — Z87891 Personal history of nicotine dependence: Secondary | ICD-10-CM | POA: Insufficient documentation

## 2020-06-05 DIAGNOSIS — R0789 Other chest pain: Secondary | ICD-10-CM | POA: Diagnosis not present

## 2020-06-05 DIAGNOSIS — I2510 Atherosclerotic heart disease of native coronary artery without angina pectoris: Secondary | ICD-10-CM | POA: Diagnosis not present

## 2020-06-05 DIAGNOSIS — Z8673 Personal history of transient ischemic attack (TIA), and cerebral infarction without residual deficits: Secondary | ICD-10-CM | POA: Diagnosis not present

## 2020-06-05 DIAGNOSIS — M546 Pain in thoracic spine: Secondary | ICD-10-CM | POA: Diagnosis present

## 2020-06-05 DIAGNOSIS — R0781 Pleurodynia: Secondary | ICD-10-CM | POA: Diagnosis not present

## 2020-06-05 LAB — COMPREHENSIVE METABOLIC PANEL
ALT: 20 U/L (ref 0–44)
AST: 21 U/L (ref 15–41)
Albumin: 4.3 g/dL (ref 3.5–5.0)
Alkaline Phosphatase: 55 U/L (ref 38–126)
Anion gap: 7 (ref 5–15)
BUN: 22 mg/dL (ref 8–23)
CO2: 26 mmol/L (ref 22–32)
Calcium: 9.5 mg/dL (ref 8.9–10.3)
Chloride: 100 mmol/L (ref 98–111)
Creatinine, Ser: 0.61 mg/dL (ref 0.44–1.00)
GFR calc Af Amer: >60 mL/min (ref >60)
GFR calc non Af Amer: >60 mL/min (ref >60)
Glucose, Bld: 103 mg/dL — ABNORMAL HIGH (ref 70–99)
Potassium: 4.1 mmol/L (ref 3.5–5.1)
Sodium: 133 mmol/L — ABNORMAL LOW (ref 135–145)
Total Bilirubin: 0.5 mg/dL (ref 0.3–1.2)
Total Protein: 7.4 g/dL (ref 6.5–8.1)

## 2020-06-05 LAB — CBC WITH DIFFERENTIAL/PLATELET
Abs Immature Granulocytes: 0.02 10*3/uL (ref 0.00–0.07)
Basophils Absolute: 0.1 10*3/uL (ref 0.0–0.1)
Basophils Relative: 1 %
Eosinophils Absolute: 0.1 10*3/uL (ref 0.0–0.5)
Eosinophils Relative: 2 %
HCT: 40.1 % (ref 36.0–46.0)
Hemoglobin: 13 g/dL (ref 12.0–15.0)
Immature Granulocytes: 0 %
Lymphocytes Relative: 39 %
Lymphs Abs: 2.4 10*3/uL (ref 0.7–4.0)
MCH: 29.1 pg (ref 26.0–34.0)
MCHC: 32.4 g/dL (ref 30.0–36.0)
MCV: 89.9 fL (ref 80.0–100.0)
Monocytes Absolute: 0.5 10*3/uL (ref 0.1–1.0)
Monocytes Relative: 8 %
Neutro Abs: 3.1 10*3/uL (ref 1.7–7.7)
Neutrophils Relative %: 50 %
Platelets: 215 10*3/uL (ref 150–400)
RBC: 4.46 MIL/uL (ref 3.87–5.11)
RDW: 12.9 % (ref 11.5–15.5)
WBC: 6.2 10*3/uL (ref 4.0–10.5)
nRBC: 0 % (ref 0.0–0.2)

## 2020-06-05 LAB — TROPONIN I (HIGH SENSITIVITY): Troponin I (High Sensitivity): 4 ng/L (ref <18)

## 2020-06-05 LAB — D-DIMER, QUANTITATIVE: D-Dimer, Quant: 0.66 ug/mL-FEU — ABNORMAL HIGH (ref 0.00–0.50)

## 2020-06-05 IMAGING — DX DG RIBS W/ CHEST 3+V*R*
3 series · 3 of 3 positions shown · non-contrast
Comparison: [DATE]

CLINICAL DATA: Right rib and chest pain

EXAM:
RIGHT RIBS AND CHEST - 3+ VIEW

[chest pa]
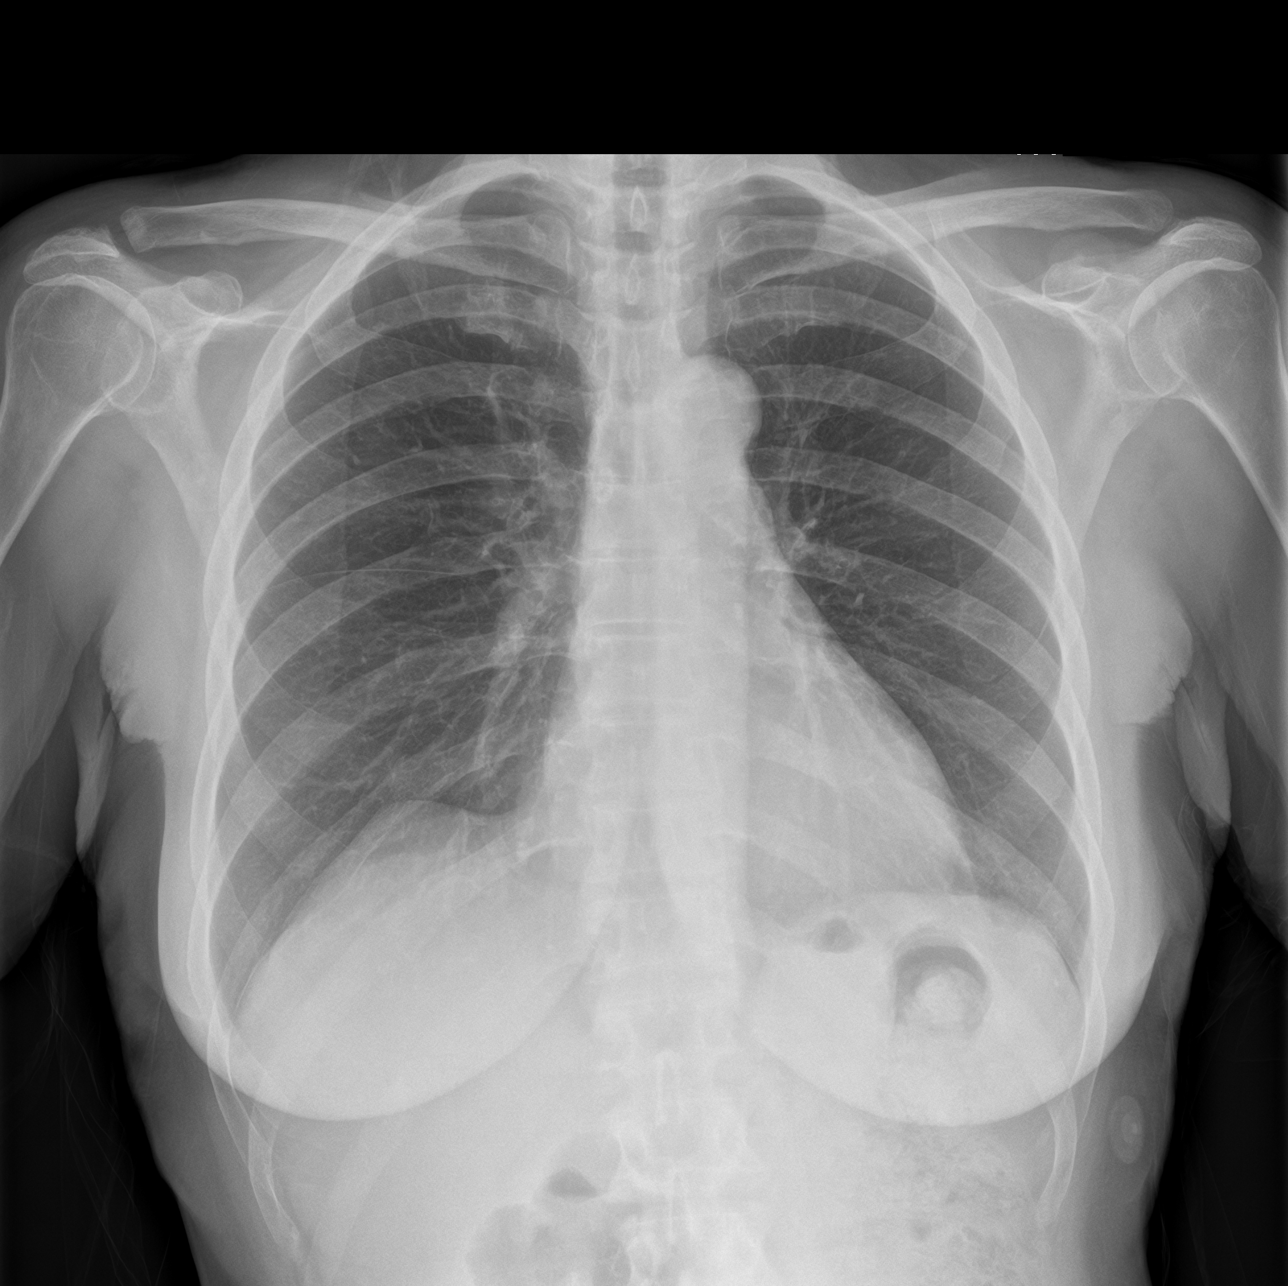

[rib pa obl]
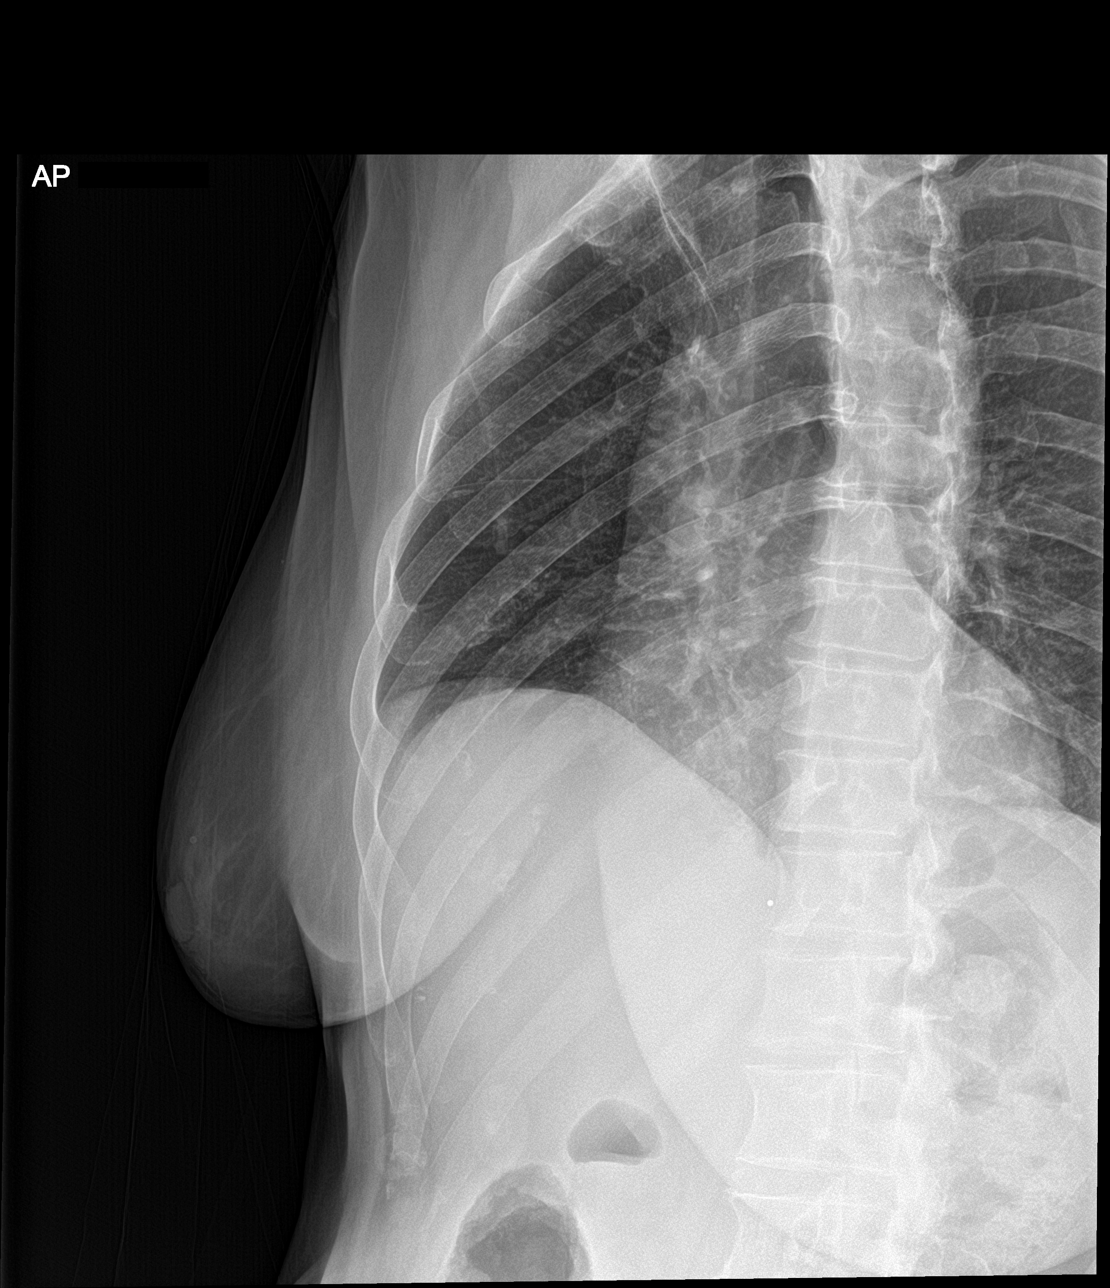

[rib pa]
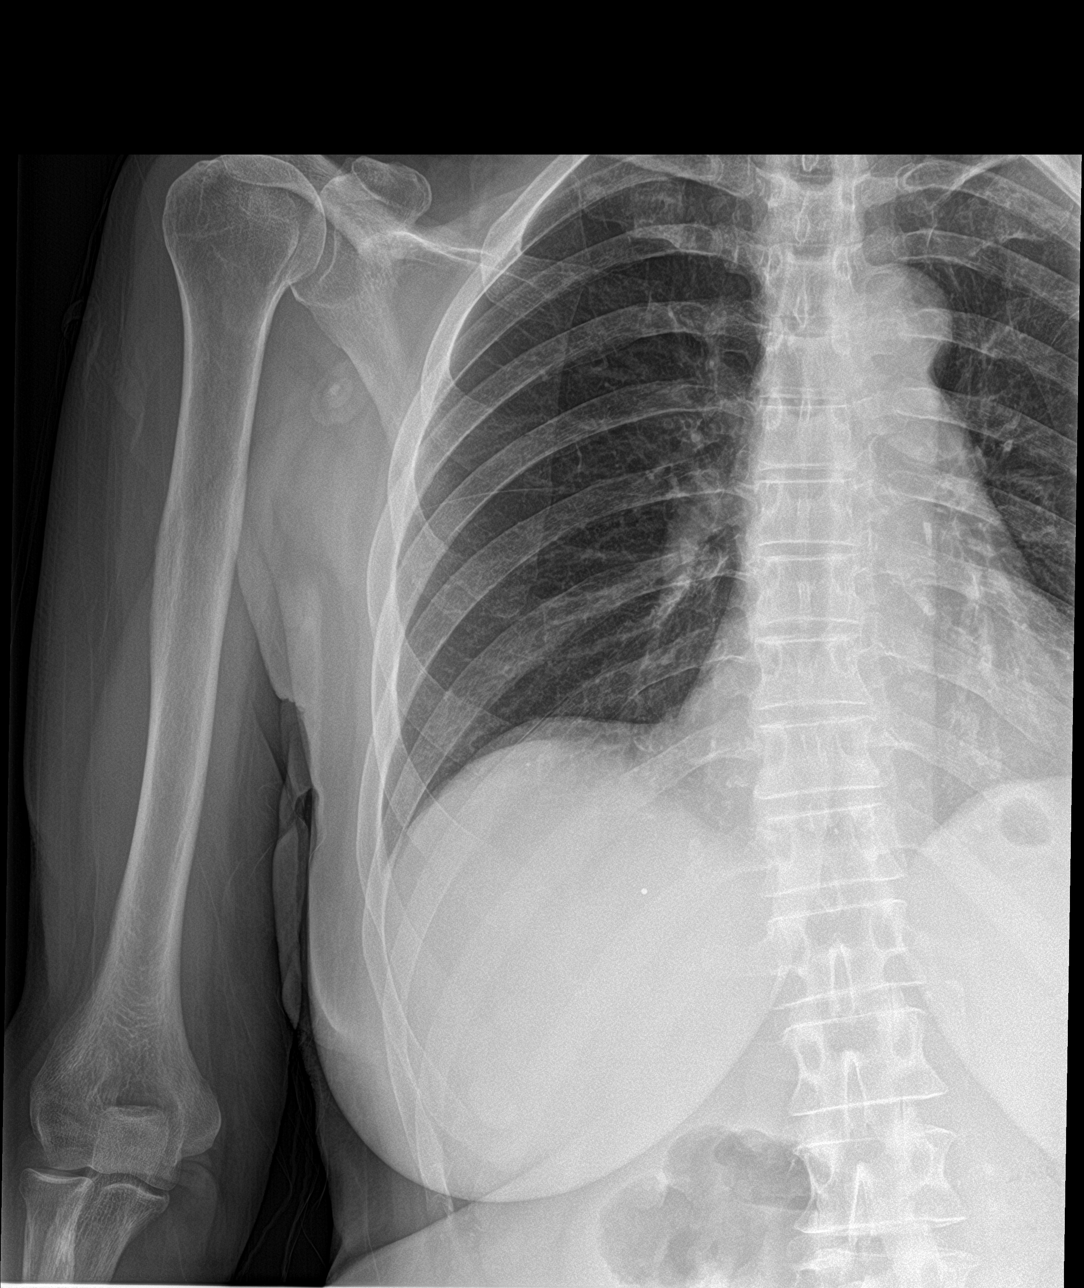

[3 of 3 positions shown; findings below may reference images not displayed]

FINDINGS: No fracture or other bone lesions are seen involving the ribs. There
is no evidence of pneumothorax or pleural effusion. Both lungs are
clear. Heart size and mediastinal contours are within normal limits.
IMPRESSION: Negative.

## 2020-06-05 MED ORDER — LIDOCAINE 5 % EX OINT
1.0000 | TOPICAL_OINTMENT | Freq: Three times a day (TID) | CUTANEOUS | 0 refills | Status: DC | PRN
Start: 2020-06-05 — End: 2021-05-30

## 2020-06-05 MED ORDER — ONDANSETRON HCL 4 MG/2ML IJ SOLN
4.0000 mg | Freq: Once | INTRAMUSCULAR | Status: AC
  Administered 2020-06-05: 4 mg via INTRAVENOUS
  Filled 2020-06-05: qty 2

## 2020-06-05 MED ORDER — MORPHINE SULFATE (PF) 4 MG/ML IV SOLN
4.0000 mg | Freq: Once | INTRAVENOUS | Status: AC
  Administered 2020-06-05: 4 mg via INTRAVENOUS
  Filled 2020-06-05: qty 1

## 2020-06-05 MED ORDER — LIDOCAINE 5 % EX OINT
1.0000 | TOPICAL_OINTMENT | Freq: Three times a day (TID) | CUTANEOUS | 0 refills | Status: DC | PRN
Start: 2020-06-05 — End: 2020-06-05

## 2020-06-05 NOTE — ED Notes (Signed)
ED Provider at bedside. 

## 2020-06-05 NOTE — ED Notes (Signed)
Patient transported to X-ray 

## 2020-06-05 NOTE — ED Triage Notes (Signed)
Pt reports new onset back pain on mid-right side which began 2 days ago. Pt reports trying to take 1/2 tab of Percocet at home and Valium at home to help get sleep without relief from symptoms.

## 2020-06-05 NOTE — ED Provider Notes (Signed)
Emergency Department Provider Note   I have reviewed the triage vital signs and the nursing notes.   HISTORY  Chief Complaint Back Pain   HPI Heather [name] is a 74 y.o. female with PMH reviewed below presents to the emergency department for evaluation of acute onset right mid back pain.  Patient was awoken from sleep with severe pain.  She describes a localized area of discomfort which she can place several fingers on and reproduce symptoms.  Pain is also worse with moving or deep breathing.  She denies shortness of breath.  No fevers or chills.  No numbness.  No rash over the area.  She does have history of back pain but notes this is very different and more severe.  She is not experiencing abdominal pain or lower back discomfort.  No arm or leg numbness/weakness.    Past Medical History:  Diagnosis Date   Adrenal adenoma    Allergy    Anemia    Anxiety    CAD (coronary artery disease)    50-60% mid LAD at cardiac catheterization 2014 ([place])   Cataract    Depression    DJD (degenerative joint disease) of cervical spine 09/12/2016   History of cardiomyopathy    History of tobacco use    Hyperlipidemia    Neuromuscular disorder (HCC)    PAF (paroxysmal atrial fibrillation) ([place])    Diagnosis July 2017 - spontaneously resolved   Statin intolerance     Patient Active Problem List   Diagnosis Date Noted   Cough 12/14/2019   History of COVID-19 12/10/2019   Abnormal myocardial perfusion study    Muscle spasm 02/25/2018   Tubular adenoma of colon 07/19/2017   H. pylori infection 07/19/2017   Thoracic back pain 02/08/2017   Hx of colonic polyps 12/03/2016   Family history of colon cancer 12/03/2016   Encounter for therapeutic drug monitoring 11/21/2016   CAD (coronary artery disease) 09/12/2016   PAF (paroxysmal atrial fibrillation) ([place]) 09/12/2016   Syncope 09/12/2016   DJD (degenerative joint disease) of cervical spine 09/12/2016     Statin intolerance 09/12/2016   TIA (transient ischemic attack) 09/12/2016   Diverticulitis of colon 09/12/2016   Myofascial pain syndrome 09/06/2016   Takotsubo syndrome 09/06/2016   HLD (hyperlipidemia) 09/06/2016   Depression 09/06/2016   Recurrent UTI (urinary tract infection) 09/06/2016    Past Surgical History:  Procedure Laterality Date   ABDOMINAL HYSTERECTOMY  1980   endometriosis   CATARACT EXTRACTION Left 11/2018   LEFT HEART CATH AND CORONARY ANGIOGRAPHY N/A 09/25/2018   Procedure: LEFT HEART CATH AND CORONARY ANGIOGRAPHY;  Surgeon: Belva Crome, MD;  Location: [facility];  Service: Cardiovascular;  Laterality: N/A;   TONSILLECTOMY  1954    Allergies Statins and Sulfa antibiotics  Family History  Problem Relation Age of Onset   COPD Mother    Hearing loss Mother    Stroke Mother    Heart disease Mother        chf, died at 71   Arthritis Father    Hypertension Father    Heart disease Father        cad, pvd, died at 52   Heart disease Sister        heart valve   Arthritis Sister    Hyperlipidemia Brother    Hypertension Brother    Cancer - Prostate Brother     Social History Social History   Tobacco Use   Smoking status: Former Smoker  Packs/day: 0.00    Types: Cigarettes    Start date: 11/12/1965    Quit date: 09/06/2008    Years since quitting: 11.7   Smokeless tobacco: Never Used  Vaping Use   Vaping Use: Never used  Substance Use Topics   Alcohol use: Yes    Alcohol/week: 1.0 standard drink    Types: 1 Glasses of wine per week    Comment: occ   Drug use: Yes    Types: Marijuana    Comment: rarely    Review of Systems  Constitutional: No fever/chills Eyes: No visual changes. ENT: No sore throat. Cardiovascular: Denies chest pain. Respiratory: Denies shortness of breath. Gastrointestinal: No abdominal pain.  No nausea, no vomiting.  No diarrhea.  No constipation. Genitourinary: Negative for  dysuria. Musculoskeletal: Positive right mid-back pain.  Skin: Negative for rash. Neurological: Negative for headaches, focal weakness or numbness.  10-point ROS otherwise negative.  ____________________________________________   PHYSICAL EXAM:  VITAL SIGNS: ED Triage Vitals  Enc Vitals Group     BP 06/05/20 0402 (!) 155/78     Pulse Rate 06/05/20 0402 60     Resp 06/05/20 0402 16     Temp 06/05/20 0402 97.6 F (36.4 C)     Temp Source 06/05/20 0402 Oral     SpO2 06/05/20 0402 98 %     Weight 06/05/20 0403 150 lb (68 kg)     Height 06/05/20 0403 5\' 4"  (1.626 m)   Constitutional: Alert and oriented. Well appearing and in no acute distress. Eyes: Conjunctivae are normal.  Head: Atraumatic. Nose: No congestion/rhinnorhea. Mouth/Throat: Mucous membranes are moist.  Neck: No stridor.  Cardiovascular: Normal rate, regular rhythm. Good peripheral circulation. Grossly normal heart sounds.   Respiratory: Normal respiratory effort.  No retractions. Lungs CTAB. Gastrointestinal: Soft and nontender. No distention.  Musculoskeletal: Focal area of reproducible tenderness to the right back/flank. No crepitus. No bruising or rash.  Neurologic:  Normal speech and language.  Skin:  Skin is warm, dry and intact. No rash noted.  ____________________________________________   LABS (all labs ordered are listed, but only abnormal results are displayed)  Labs Reviewed  COMPREHENSIVE METABOLIC PANEL - Abnormal; Notable for the following components:      Result Value   Sodium 133 (*)    Glucose, Bld 103 (*)    All other components within normal limits  D-DIMER, QUANTITATIVE (NOT AT [facility]) - Abnormal; Notable for the following components:   D-Dimer, Quant 0.66 (*)    All other components within normal limits  CBC WITH DIFFERENTIAL/PLATELET  TROPONIN I (HIGH SENSITIVITY)   ____________________________________________  EKG   EKG Interpretation  Date/Time:  Sunday June 05 2020 04:20:26  EDT Ventricular Rate:  62 PR Interval:    QRS Duration: 103 QT Interval:  411 QTC Calculation: 418 R Axis:   3 Text Interpretation: Sinus rhythm Probable anteroseptal infarct, old Baseline wander in lead(s) I II aVR No STEMI Confirmed by Nanda Quinton [phone]) on 06/05/2020 4:26:36 AM       ____________________________________________  RADIOLOGY  DG Ribs Unilateral W/Chest Right  Result Date: 06/05/2020 CLINICAL DATA:  Right rib and chest pain EXAM: RIGHT RIBS AND CHEST - 3+ VIEW COMPARISON:  02/07/2018 FINDINGS: No fracture or other bone lesions are seen involving the ribs. There is no evidence of pneumothorax or pleural effusion. Both lungs are clear. Heart size and mediastinal contours are within normal limits. IMPRESSION: Negative. Electronically Signed   By: Kerby Moors M.D.   On: 06/05/2020 05:16  ____________________________________________   PROCEDURES  Procedure(s) performed:   Procedures   ____________________________________________   INITIAL IMPRESSION / ASSESSMENT AND PLAN / ED COURSE  Pertinent labs & imaging results that were available during my care of the patient were reviewed by me and considered in my medical decision making (see chart for details).   Patient presents emergency for evaluation of mid back pain with focal area of tenderness over the right posterior and slightly lateral ribs.  There is no overlying zoster type rash.  No bruising.  Suspect MSK pain clinically.  Could also have a radicular component especially given the patient's history.  Given the patient's age I do plan for labs including D-dimer and plain film of the ribs and chest.   05:00 AM  Labs reviewed. Age-adjusted d-dimer is negative. No plan for CT imaging of the chest or spine at this time. Troponin negative. Plan for lidocaine ointment with tylenol/motrin PRN pain and close PCP and Neurology follow up. Discussed ED return precautions.     ____________________________________________  FINAL CLINICAL IMPRESSION(S) / ED DIAGNOSES  Final diagnoses:  Chest wall pain    MEDICATIONS GIVEN DURING THIS VISIT:  Medications  morphine 4 MG/ML injection 4 mg (4 mg Intravenous Given 06/05/20 0437)  ondansetron (ZOFRAN) injection 4 mg (4 mg Intravenous Given 06/05/20 0437)    Note:  This document was prepared using Dragon voice recognition software and may include unintentional dictation errors.  Nanda Quinton, MD, [facility] Emergency Medicine    Mckinzie Saksa, [facility], MD 06/05/20 [phone]

## 2020-06-05 NOTE — ED Notes (Signed)
Pt returned from X Ray.

## 2020-06-05 NOTE — Discharge Instructions (Signed)
You were seen in the emergency room today with posterior chest wall pain.  Suspect this is coming from muscles and/or nerves in the chest wall.  Your heart labs, EKG, and chest x-ray were normal.  Please use the topical pain medication along with Tylenol and or Motrin for pain.  Follow closely with your neurologist and your primary care doctor.  Return to the emergency department any fever, shortness of breath, sudden worsening symptoms.

## 2020-06-06 ENCOUNTER — Other Ambulatory Visit (INDEPENDENT_AMBULATORY_CARE_PROVIDER_SITE_OTHER): Payer: Self-pay | Admitting: Internal Medicine

## 2020-06-06 MED ORDER — BUPROPION HCL ER (SR) 150 MG PO TB12: 150.0000 mg | ORAL_TABLET | Freq: Two times a day (BID) | ORAL | 1 refills | Status: DC

## 2020-06-08 ENCOUNTER — Ambulatory Visit (INDEPENDENT_AMBULATORY_CARE_PROVIDER_SITE_OTHER): Payer: Medicare Other | Admitting: Neurology

## 2020-06-08 ENCOUNTER — Telehealth: Payer: Self-pay | Admitting: Neurology

## 2020-06-08 ENCOUNTER — Other Ambulatory Visit: Payer: Self-pay

## 2020-06-08 VITALS — BP 112/59 | HR 74 | Ht 64.0 in | Wt 156.0 lb

## 2020-06-08 DIAGNOSIS — M47812 Spondylosis without myelopathy or radiculopathy, cervical region: Secondary | ICD-10-CM

## 2020-06-08 DIAGNOSIS — I25119 Atherosclerotic heart disease of native coronary artery with unspecified angina pectoris: Secondary | ICD-10-CM

## 2020-06-08 DIAGNOSIS — M62838 Other muscle spasm: Secondary | ICD-10-CM | POA: Diagnosis not present

## 2020-06-08 DIAGNOSIS — M7918 Myalgia, other site: Secondary | ICD-10-CM

## 2020-06-08 DIAGNOSIS — M549 Dorsalgia, unspecified: Secondary | ICD-10-CM | POA: Diagnosis not present

## 2020-06-08 DIAGNOSIS — M542 Cervicalgia: Secondary | ICD-10-CM | POA: Diagnosis not present

## 2020-06-08 MED ORDER — METHYLPREDNISOLONE 4 MG PO TABS: ORAL_TABLET | ORAL | 0 refills | Status: DC

## 2020-06-08 NOTE — Progress Notes (Signed)
[facility]  PATIENT: Heather [name] DOB: 03-Oct-1946  REFERRING DOCTOR OR PCP:  Blanchie Serve SOURCE: patient  _________________________________   HISTORICAL  CHIEF COMPLAINT:  Chief Complaint  Patient presents with  . Follow-up    RM 12. Last seen 03/08/2020. Here for botox  . Botulinum Toxin Injection    100Ux1vial. WUJ:[id number]  Exp:09/2022  GNF:[id number]    HISTORY OF PRESENT ILLNESS:  Heather [name] is a 74 y.o. woman who has had chronic right-sided mid back pain since 2013.      Update 06/08/2020: She is having more musculoskeletal pain.  In the past, this was mostly in the right flank but now she is experiencing pain in the lower neck and along her spine.  The actual right flank myofascial pain that was her worst issue in the past has done fairly well with Botox.  TENS did not help the more recent pain.  Percocet did not help.   She went to the ED and had an xray and had IV morphine with short term benefit.   Lidocaine ointment caused an allergic reaction.   Finally prednisone helped some.       Update 03/08/2020: The right flank pain is doing better with Botox injections.   Quality of pain is cramp-like.   It is present right of T6-t9 in the intracostal space.   She has not needed any oxycodone.  Pain fluctuates being more severe some days.   She is still feeling more fatigued since Covid 19 earlier this year.     She has chronic atrial fibrillation is on warfarin.  She has not had a stroke or TIA.   Update 12/10/2019: She reports pain in the right flank similar to pain she has experienced in the past..  She has had this for the past 4 years.  Pain is the right T6-T9 intracostal region.  The pain has a cramp-like quality.  She has been on Botox for the last 2+ years and the pain improves for the majority of the 89-month interval.  She feels it has helped the pain about 75%.  She tolerates the injections well.    She takes Percocet prn if pain is more  severe.  She is otherwise doing well.  She was visiting a friend who had severe CHF and she and her husband both caught Covid-19.   She had fatigue and congestion but no other symptoms.   She still feels congested.  She has chronic atrial fibrillation is on warfarin.  She has not had a stroke or TIA.  Update 09/09/2019: She reports pain in the right flank.  She has had this for the past 3 to 4 years.  Pain is the right T6-T9 intracostal region.  The pain has a cramp-like quality.  She has been on Botox for the last 2 years and the pain improves.  She feels it has helped the pain about 75%.  She tolerates the injections well.    She takes Percocet prn if pain is more severe.  She is otherwise doing well.  She also has chronic A. Fib and is on warfarin.  She has not had any TIA or stroke symptoms.   Update 06/04/2019: She has had pain in the right flank x several years.  Wprse pain is the right T6-T9 intracostal region.   She has done well with Botox and reports that pain has been reduced by about 75%.   For couple weeks she had almost no pain.  She tolerates the  injections well.   Pain has a cramp-like quality.   She takes Percocet prn if pain is more severe.  She is otherwise doing well.  She also has chronic A. Fib and is on warfarin  Update 03/03/2019: She has had pain in the right flank x several years.  Wprse pain is the right T6-T9 intracostal region.   She has done well with Botox and reports that pain has been reduced by about 75%.    She tolerates the injections well.   Pain has a cramp-like quality.   She takes Percocet prn if pain is more severe.  She is otherwise doing well.  She is on warfarin for chronic A. fib.  Update 11/28/2018: Her myofascial pain in the mid to lower right thoracic region has responded very well to the Botox therapy and is more than 75% better than it was initially.  Most of the pain is in the right intercostal region from T6-T9.  She has tolerated the Botox  injections well.   When the pain is more severe, she will take a Percocet.  The quality of the pain is cramp-like.  Update 08/27/2018: She reports that the back pain is more than 50% better and the episodes of more significant pain have been significantly reduced.  She tolerated the injections without any problems.  Most of her pain is in the right intracoastal region from T6-T9.     When pain is more severe, she will take a Percocet.  She does not take more than 2 or 3 a week  She is on Warfarin for chronic Afib.     Update 05/22/2018: She is here today for her first Botox injections for myofascial pain in the right intracoastal region (T7-T9)  Update 02/25/2018: Her right sided thoracic pain flared up a week ago and was intense x 2-3 days.   Opioids make her feel sick. Diazepam 10 mg, Ibu and MJ helped her pain.    Currently pain is much better (improved over last 24 hours).   She also used the [employer] patch.     The first TPI we did helped her pain but the more recent one made pain worse.     She is wondering about Botox.    We discussed it in more detail.She  Is on coumadin for Afib.     From 05/01/2017: Her pain is about 1 inch right of midline in the lower thoracic paraspinal region. 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 Pain worsens often with household chores or gardening. The pain may improve with using a TENS unit, a trigger point or Percocet. Heating pad also sometimes helps. When pain is more severe she has smoked marijuana with some benefit. Cannabinol oil did not help. Flexeril did not help. More recently she took a tizanidine and was very sleepy though pain was better that day. More recently, she took a tizanidine 4 mg but fell asleep 1-2 hours.  The trigger point injection at the last visit (thoracic paraspinal) did not help.   She denies any numbness or weakness in her arms or legs.   No bowel or bladder changes.    She is on coumadin for  recently diagnose atrial fibrillation.      She was getting trigger point injections every 3-6 months for most of the past 4 years but needed more frequently last year when she was packing up to move to this area.  I reviewed her MRI results from 10/14/2013. The MRI of the brain showed age related atrophy and minimal chronic microvascular ischemic change. MRI of the cervical spine showed multilevel mild degenerative changes with left paramedian disc herniation at C3-C4 and left disc protrusion at C4-C5 and C5-C6 and midline disc herniation at C6-C7. There was no report of nerve root compression. MRI of the thoracic spine showed disc desiccation but no herniation or protrusions. MRI of the lumbar spine showed disc bulges at T12-L1 and L2-L3 and disc bulge with facet hypertrophy at L3-L4 and disc bulge with right foraminal annular tear at L4-L5.   REVIEW OF SYSTEMS: Constitutional: No fevers, chills, sweats, or change in appetite Eyes: No visual changes, double vision, eye pain Ear, nose and throat: No hearing loss, ear pain, nasal congestion, sore throat Cardiovascular: No chest pain, palpitations.   She has atrial fibrillation and is on warfarin Respiratory: No shortness of breath at rest or with exertion.   No wheezes GastrointestinaI: No nausea, vomiting, diarrhea, abdominal pain, fecal incontinence Genitourinary: No dysuria, urinary retention or frequency.  No nocturia. Musculoskeletal: No neck pain.  She has back pain/thoracic pain as above Integumentary: No rash, pruritus, skin lesions Neurological: as above Psychiatric: No depression at this time.  No anxiety Endocrine: No palpitations, diaphoresis, change in appetite, change in weigh or increased thirst Hematologic/Lymphatic: No anemia, purpura, petechiae. Allergic/Immunologic: No itchy/runny eyes, nasal congestion, recent allergic reactions, rashes  ALLERGIES: Allergies  Allergen Reactions  . Statins Other (See Comments)      Intolerant all statins  . Sulfa Antibiotics Rash    HOME MEDICATIONS:  Current Outpatient Medications:  .  Biotin 10000 MCG TABS, Take 10,000 mcg by mouth daily. , Disp: , Rfl:  .  buPROPion (WELLBUTRIN SR) 150 MG 12 hr tablet, Take 1 tablet (150 mg total) by mouth 2 (two) times daily., Disp: 180 tablet, Rfl: 1 .  Cholecalciferol (VITAMIN D3) 125 MCG (5000 UT) TABS, Take 15,000 Units by mouth daily. , Disp: , Rfl:  .  Coenzyme Q10 100 MG capsule, Take 100 mg by mouth daily. , Disp: , Rfl:  .  Cyanocobalamin (VITAMIN B-12) 2500 MCG SUBL, Place 2,500 mcg under the tongue daily., Disp: , Rfl:  .  docusate sodium (COLACE) 100 MG capsule, Take 100 mg by mouth 2 (two) times daily. , Disp: , Rfl:  .  estradiol (ESTRACE) 2 MG tablet, Take 1 tablet by mouth daily, Disp: 30 tablet, Rfl: 4 .  lidocaine (XYLOCAINE) 5 % ointment, Apply 1 application topically 3 (three) times daily as needed., Disp: 35.44 g, Rfl: 0 .  nitroGLYCERIN (NITROSTAT) 0.4 MG SL tablet, Place 1 tablet (0.4 mg total) under the tongue every 5 (five) minutes as needed for chest pain., Disp: 25 tablet, Rfl: 3 .  oxyCODONE-acetaminophen (PERCOCET/ROXICET) 5-325 MG tablet, TAKE 1 TABLET DAILY AS NEEDED FOR SEVERE PAIN., Disp: 30 tablet, Rfl: 0 .  progesterone (PROMETRIUM) 200 MG capsule, Take 1 capsule by mouth daily., Disp: 90 capsule, Rfl: 0 .  Testosterone 20 % CREA, Apply 1 application topically daily. , Disp: , Rfl:  .  thyroid (NP THYROID) 60 MG tablet, Take 1 tablet (60 mg total) by mouth daily before lunch., Disp: 30 tablet, Rfl: 3 .  warfarin (COUMADIN) 5 MG tablet, Take 2 tablets by mouth daily or as directed. (Patient taking differently: Take 1 tablet daily except on Monday and Thursday she is to take 2 tablets daily), Disp: 180 tablet, Rfl: 1 .  methylPREDNISolone (MEDROL) 4 MG  tablet, Taper from 6 pills po for one day to 1 pill po the last day over 6 days, Disp: 21 tablet, Rfl: 0  PAST MEDICAL HISTORY: Past Medical  History:  Diagnosis Date  . Adrenal adenoma   . Allergy   . Anemia   . Anxiety   . CAD (coronary artery disease)    50-60% mid LAD at cardiac catheterization 2014 [facility])  . Cataract   . Depression   . DJD (degenerative joint disease) of cervical spine 09/12/2016  . History of cardiomyopathy   . History of tobacco use   . Hyperlipidemia   . Neuromuscular disorder ([place])   . PAF (paroxysmal atrial fibrillation) [facility])    Diagnosis July 2017 - spontaneously resolved  . Statin intolerance     PAST SURGICAL HISTORY: Past Surgical History:  Procedure Laterality Date  . ABDOMINAL HYSTERECTOMY  1980   endometriosis  . CATARACT EXTRACTION Left 11/2018  . LEFT HEART CATH AND CORONARY ANGIOGRAPHY N/A 09/25/2018   Procedure: LEFT HEART CATH AND CORONARY ANGIOGRAPHY;  Surgeon: Belva Crome, MD;  Location: [place] [facility];  Service: Cardiovascular;  Laterality: N/A;  . TONSILLECTOMY  1954    FAMILY HISTORY: Family History  Problem Relation Age of Onset  . COPD Mother   . Hearing loss Mother   . Stroke Mother   . Heart disease Mother        chf, died at 66  . Arthritis Father   . Hypertension Father   . Heart disease Father        cad, pvd, died at 67  . Heart disease Sister        heart valve  . Arthritis Sister   . Hyperlipidemia Brother   . Hypertension Brother   . Cancer - Prostate Brother     SOCIAL HISTORY:  Social History   Socioeconomic History  . Marital status: Married    Spouse name: Al  . Number of children: 0  . Years of education: 20  . Highest education level: Not on file  Occupational History  . Occupation: Probation officer    Comment: from home  Tobacco Use  . Smoking status: Former Smoker    Packs/day: 0.00    Types: Cigarettes    Start date: 11/12/1965    Quit date: 09/06/2008    Years since quitting: 11.7  . Smokeless tobacco: Never Used  Vaping Use  . Vaping Use: Never used  Substance and Sexual Activity  . Alcohol use: Yes     Alcohol/week: 1.0 standard drink    Types: 1 Glasses of wine per week    Comment: occ  . Drug use: Yes    Types: Marijuana    Comment: rarely  . Sexual activity: Yes    Birth control/protection: Post-menopausal  Other Topics Concern  . 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 Difficulty of Paying Living Expenses:   Food Insecurity:   . Worried About Charity fundraiser in the Last Year:   . Arboriculturist in the Last Year:   Transportation Needs:   . Film/video editor (Medical):   Marland Kitchen Lack of Transportation (Non-Medical):   Physical Activity:   . Days of Exercise per Week:   . Minutes of Exercise per Session:   Stress:   .  Feeling of Stress :   Social Connections:   . Frequency of Communication with Friends and Family:   . Frequency of Social Gatherings with Friends and Family:   . Attends Religious Services:   . Active Member of Clubs or Organizations:   . Attends Archivist Meetings:   Marland Kitchen Marital Status:   Intimate Partner Violence:   . Fear of Current or Ex-Partner:   . Emotionally Abused:   Marland Kitchen Physically Abused:   . Sexually Abused:      PHYSICAL EXAM  Vitals:   06/08/20 1406  BP: (!) 112/59  Pulse: 74  Weight: 156 lb (70.8 kg)  Height: 5\' 4"  (1.626 m)    Body mass index is 26.78 kg/m.   General: The patient is well-developed and well-nourished and in no acute distress.  Head is normocephalic and atraumatic.   Musculoskeletal : She has tenderness over the right C7 paraspinal muscle, infraspinatus, rhomboid, T7-T8 paraspinal and L3 paraspinal muscles she has tenderness in the mid to lower thoracic intracoastal region from T7--T9 on the right.  There is no tenderness on the left or in the neck or lower back.  Range of motion is normal in the spine.  Neurologic Exam  Mental status: The patient  is alert and oriented x 3 at the time of the examination.  Focus and attention seem normal.   Speech is normal.  Cranial nerves: Extraocular muscles are intact.  Facial strength is normal.   . No obvious hearing deficits are noted.  Motor: She has normal muscle tone, muscle bulk and muscle strength in the arms or legs.  Sensation: Intact sensation to touch in the arms and legs.  Gait and station: Gait and station are normal.     ASSESSMENT AND PLAN  1. Neck pain   2. Osteoarthritis of cervical spine, unspecified spinal osteoarthritis complication status   3. Mid back pain   4. Myofascial pain syndrome   5. Muscle spasm      1.  Inject 100 units Botox split into tender points below the T6-T9 ribs into the intercostal space.  There were no complications and she tolerated the injections well.    2.   Stay active.  Exercise as tolerated.   3.   She will return in 3-4 months or sooner if  new or worsening neurologic symptoms.   Lashon Beringer A. Felecia Shelling, MD, PhD 8/85/0277, 4:12 PM Certified in Neurology, Clinical Neurophysiology, Sleep Medicine, Pain Medicine and Neuroimaging  [facility] [address] [place], [place] [address] [phone]

## 2020-06-08 NOTE — Telephone Encounter (Signed)
Medicare/[employer] order sent to [facility]. No auth they will reach out to the patient to schedule.

## 2020-06-09 ENCOUNTER — Ambulatory Visit (INDEPENDENT_AMBULATORY_CARE_PROVIDER_SITE_OTHER): Payer: Medicare Other | Admitting: *Deleted

## 2020-06-09 DIAGNOSIS — Z5181 Encounter for therapeutic drug level monitoring: Secondary | ICD-10-CM | POA: Diagnosis not present

## 2020-06-09 DIAGNOSIS — I480 Paroxysmal atrial fibrillation: Secondary | ICD-10-CM

## 2020-06-09 LAB — POCT INR: INR: 3.8 — AB (ref 2.0–3.0)

## 2020-06-09 NOTE — Patient Instructions (Signed)
Hold warfarin tonight then decrease dose to 1 tablet daily except 1 1/2 tablets on Mondays  Recheck in 3 weeks

## 2020-06-14 ENCOUNTER — Other Ambulatory Visit: Payer: Self-pay | Admitting: Neurology

## 2020-06-14 MED ORDER — ALPRAZOLAM 0.5 MG PO TABS: ORAL_TABLET | ORAL | 0 refills | Status: DC

## 2020-06-14 NOTE — Telephone Encounter (Signed)
Okay, I will send in a prescription for 3 Xanax tablets to take before the MRI.

## 2020-06-14 NOTE — Telephone Encounter (Signed)
Patient called stating that she is claustrophobic and would like something to help her. She would like it to be sent to [facility] in [place]. She is aware to have a driver.

## 2020-06-14 NOTE — Telephone Encounter (Signed)
Pt last seen 06/08/20 and next appt 09/14/20. Has MRI's scheduled for 07/05/20.  Checked drug regsitry, has not received xanax from other MD's. Only has received oxycodone in the last year from Dr. Felecia Shelling.

## 2020-06-22 ENCOUNTER — Other Ambulatory Visit: Payer: Self-pay | Admitting: Neurology

## 2020-06-22 MED ORDER — OXYCODONE-ACETAMINOPHEN 5-325 MG PO TABS: ORAL_TABLET | ORAL | 0 refills | Status: DC

## 2020-06-22 NOTE — Telephone Encounter (Signed)
Pt is needing a refill on her oxyCODONE-acetaminophen (PERCOCET/ROXICET) 5-325 MG tablet sent in to the [facility]

## 2020-06-30 ENCOUNTER — Ambulatory Visit (INDEPENDENT_AMBULATORY_CARE_PROVIDER_SITE_OTHER): Payer: Medicare Other | Admitting: *Deleted

## 2020-06-30 DIAGNOSIS — Z5181 Encounter for therapeutic drug level monitoring: Secondary | ICD-10-CM

## 2020-06-30 DIAGNOSIS — I480 Paroxysmal atrial fibrillation: Secondary | ICD-10-CM

## 2020-06-30 LAB — POCT INR: INR: 1.5 — AB (ref 2.0–3.0)

## 2020-06-30 NOTE — Patient Instructions (Signed)
Take warfarin 2 tablets tonight then increase dose to 1 tablet daily except 1 1/2 tablets on Mondays, Wednesdays and Fridays Recheck in 2 weeks

## 2020-07-04 ENCOUNTER — Ambulatory Visit (INDEPENDENT_AMBULATORY_CARE_PROVIDER_SITE_OTHER): Payer: Medicare Other | Admitting: Internal Medicine

## 2020-07-05 ENCOUNTER — Other Ambulatory Visit: Payer: Self-pay

## 2020-07-05 ENCOUNTER — Ambulatory Visit
Admission: RE | Admit: 2020-07-05 | Discharge: 2020-07-05 | Disposition: A | Discharge status: Home / Self Care | Payer: Medicare Other | Source: Ambulatory Visit | Attending: Neurology | Admitting: Neurology

## 2020-07-05 DIAGNOSIS — M542 Cervicalgia: Secondary | ICD-10-CM

## 2020-07-05 DIAGNOSIS — M549 Dorsalgia, unspecified: Secondary | ICD-10-CM

## 2020-07-05 DIAGNOSIS — M47812 Spondylosis without myelopathy or radiculopathy, cervical region: Secondary | ICD-10-CM

## 2020-07-06 ENCOUNTER — Other Ambulatory Visit (INDEPENDENT_AMBULATORY_CARE_PROVIDER_SITE_OTHER): Payer: Self-pay | Admitting: Internal Medicine

## 2020-07-11 ENCOUNTER — Ambulatory Visit (INDEPENDENT_AMBULATORY_CARE_PROVIDER_SITE_OTHER): Payer: Medicare Other | Admitting: *Deleted

## 2020-07-11 DIAGNOSIS — I480 Paroxysmal atrial fibrillation: Secondary | ICD-10-CM

## 2020-07-11 DIAGNOSIS — Z5181 Encounter for therapeutic drug level monitoring: Secondary | ICD-10-CM

## 2020-07-11 LAB — POCT INR: INR: 2.3 (ref 2.0–3.0)

## 2020-07-11 NOTE — Patient Instructions (Signed)
Continue warfarin 1 tablet daily except 1 1/2 tablets on Mondays, Wednesdays and Fridays  Recheck in 3 weeks 

## 2020-07-14 ENCOUNTER — Other Ambulatory Visit (INDEPENDENT_AMBULATORY_CARE_PROVIDER_SITE_OTHER): Payer: Self-pay | Admitting: Internal Medicine

## 2020-07-14 MED ORDER — ALPRAZOLAM 0.5 MG PO TABS: 0.5000 mg | ORAL_TABLET | Freq: Two times a day (BID) | ORAL | 0 refills | Status: DC | PRN

## 2020-07-25 ENCOUNTER — Other Ambulatory Visit (INDEPENDENT_AMBULATORY_CARE_PROVIDER_SITE_OTHER): Payer: Self-pay | Admitting: Internal Medicine

## 2020-07-25 ENCOUNTER — Encounter (INDEPENDENT_AMBULATORY_CARE_PROVIDER_SITE_OTHER): Payer: Self-pay | Admitting: Internal Medicine

## 2020-07-25 ENCOUNTER — Other Ambulatory Visit: Payer: Self-pay | Admitting: Neurology

## 2020-07-25 MED ORDER — NP THYROID 60 MG PO TABS: 60.0000 mg | ORAL_TABLET | Freq: Every day | ORAL | 1 refills | Status: DC

## 2020-07-25 MED ORDER — PROGESTERONE 200 MG PO CAPS: 200.0000 mg | ORAL_CAPSULE | Freq: Every day | ORAL | 1 refills | Status: DC

## 2020-07-25 MED ORDER — OXYCODONE-ACETAMINOPHEN 5-325 MG PO TABS: ORAL_TABLET | ORAL | 0 refills | Status: DC

## 2020-07-25 MED ORDER — NP THYROID 120 MG PO TABS: 120.0000 mg | ORAL_TABLET | Freq: Every day | ORAL | 1 refills | Status: DC

## 2020-07-25 NOTE — Telephone Encounter (Signed)
Please advise 

## 2020-07-25 NOTE — Telephone Encounter (Signed)
Pt request refill  oxyCODONE-acetaminophen (PERCOCET/ROXICET) 5-325 MG tablet at [facility] 

## 2020-08-02 ENCOUNTER — Ambulatory Visit (INDEPENDENT_AMBULATORY_CARE_PROVIDER_SITE_OTHER): Payer: Medicare Other | Admitting: *Deleted

## 2020-08-02 DIAGNOSIS — I480 Paroxysmal atrial fibrillation: Secondary | ICD-10-CM | POA: Diagnosis not present

## 2020-08-02 DIAGNOSIS — Z5181 Encounter for therapeutic drug level monitoring: Secondary | ICD-10-CM

## 2020-08-02 DIAGNOSIS — Z20828 Contact with and (suspected) exposure to other viral communicable diseases: Secondary | ICD-10-CM | POA: Diagnosis not present

## 2020-08-02 LAB — POCT INR: INR: 1.9 — AB (ref 2.0–3.0)

## 2020-08-02 NOTE — Patient Instructions (Signed)
Take warfarin 1 1/2 tablets tonight then increase dose to 1 1/2 tablets daily except 1 tablet on Sundays, Tuesdays and Thursdays Recheck in 4 weeks

## 2020-08-15 ENCOUNTER — Other Ambulatory Visit (INDEPENDENT_AMBULATORY_CARE_PROVIDER_SITE_OTHER): Payer: Self-pay | Admitting: Internal Medicine

## 2020-08-15 ENCOUNTER — Encounter (INDEPENDENT_AMBULATORY_CARE_PROVIDER_SITE_OTHER): Payer: Self-pay | Admitting: Internal Medicine

## 2020-08-15 DIAGNOSIS — R1032 Left lower quadrant pain: Secondary | ICD-10-CM

## 2020-08-22 DIAGNOSIS — H353132 Nonexudative age-related macular degeneration, bilateral, intermediate dry stage: Secondary | ICD-10-CM | POA: Diagnosis not present

## 2020-08-25 ENCOUNTER — Ambulatory Visit (INDEPENDENT_AMBULATORY_CARE_PROVIDER_SITE_OTHER): Payer: Medicare Other | Admitting: *Deleted

## 2020-08-25 ENCOUNTER — Other Ambulatory Visit: Payer: Self-pay

## 2020-08-25 DIAGNOSIS — Z5181 Encounter for therapeutic drug level monitoring: Secondary | ICD-10-CM

## 2020-08-25 DIAGNOSIS — I480 Paroxysmal atrial fibrillation: Secondary | ICD-10-CM

## 2020-08-25 LAB — POCT INR: INR: 2.1 (ref 2.0–3.0)

## 2020-08-25 NOTE — Patient Instructions (Signed)
Continue warfarin 1/2 tablets daily except 1 tablet on Sundays, Tuesdays and Thursdays Recheck in 4 weeks

## 2020-09-08 ENCOUNTER — Ambulatory Visit (INDEPENDENT_AMBULATORY_CARE_PROVIDER_SITE_OTHER): Payer: Medicare Other | Admitting: Internal Medicine

## 2020-09-13 ENCOUNTER — Other Ambulatory Visit: Payer: Self-pay

## 2020-09-13 ENCOUNTER — Ambulatory Visit (INDEPENDENT_AMBULATORY_CARE_PROVIDER_SITE_OTHER): Payer: Medicare Other | Admitting: Internal Medicine

## 2020-09-13 ENCOUNTER — Encounter (INDEPENDENT_AMBULATORY_CARE_PROVIDER_SITE_OTHER): Payer: Self-pay | Admitting: Internal Medicine

## 2020-09-13 VITALS — BP 130/70 | HR 77 | Temp 97.3°F | Resp 18 | Ht 64.0 in | Wt 158.0 lb

## 2020-09-13 DIAGNOSIS — E2839 Other primary ovarian failure: Secondary | ICD-10-CM | POA: Diagnosis not present

## 2020-09-13 DIAGNOSIS — E785 Hyperlipidemia, unspecified: Secondary | ICD-10-CM

## 2020-09-13 DIAGNOSIS — R5383 Other fatigue: Secondary | ICD-10-CM

## 2020-09-13 DIAGNOSIS — I25119 Atherosclerotic heart disease of native coronary artery with unspecified angina pectoris: Secondary | ICD-10-CM | POA: Diagnosis not present

## 2020-09-13 DIAGNOSIS — R5381 Other malaise: Secondary | ICD-10-CM | POA: Diagnosis not present

## 2020-09-13 MED ORDER — FIRST-TESTOSTERONE MC 2 % TD CREA
5.0000 mg | TOPICAL_CREAM | Freq: Every day | TRANSDERMAL | 0 refills | Status: DC
Start: 2020-09-13 — End: 2021-07-10

## 2020-09-13 NOTE — Addendum Note (Signed)
Addended by: Doree Albee on: 09/13/2020 04:52 PM   Modules accepted: Orders

## 2020-09-13 NOTE — Progress Notes (Addendum)
Metrics: Intervention Frequency ACO  Documented Smoking Status Yearly  Screened one or more times in 24 months  Cessation Counseling or  Active cessation medication Past 24 months  Past 24 months   Guideline developer: UpToDate (See UpToDate for funding source) Date Released: 2014       Wellness Office Visit  Subjective:  Patient ID: Heather [name], female    DOB: 03/27/1946  Age: 74 y.o. MRN: [id number]  CC: This lady comes in for follow-up of bioidentical hormone therapy, hyperlipidemia and fatigue. HPI  She is tolerating desiccated NP thyroid, off label, for symptoms of thyroid hypofunction. She has not been taking testosterone therapy for a long time but she felt better when she was taking this. She has begun intermittent fasting 2days out of 7 days and tries to eat healthier.  She wants to lose weight. Past Medical History:  Diagnosis Date  . Adrenal adenoma   . Allergy   . Anemia   . Anxiety   . CAD (coronary artery disease)    50-60% mid LAD at cardiac catheterization 2014 [facility])  . Cataract   . Depression   . DJD (degenerative joint disease) of cervical spine 09/12/2016  . History of cardiomyopathy   . History of tobacco use   . Hyperlipidemia   . Neuromuscular disorder ([place])   . PAF (paroxysmal atrial fibrillation) [facility])    Diagnosis July 2017 - spontaneously resolved  . Statin intolerance    Past Surgical History:  Procedure Laterality Date  . ABDOMINAL HYSTERECTOMY  1980   endometriosis  . CATARACT EXTRACTION Left 11/2018  . LEFT HEART CATH AND CORONARY ANGIOGRAPHY N/A 09/25/2018   Procedure: LEFT HEART CATH AND CORONARY ANGIOGRAPHY;  Surgeon: Belva Crome, MD;  Location: [place] [facility];  Service: Cardiovascular;  Laterality: N/A;  . TONSILLECTOMY  1954     Family History  Problem Relation Age of Onset  . COPD Mother   . Hearing loss Mother   . Stroke Mother   . Heart disease Mother        chf, died at 59  . Arthritis Father   .  Hypertension Father   . Heart disease Father        cad, pvd, died at 83  . Heart disease Sister        heart valve  . Arthritis Sister   . Hyperlipidemia Brother   . Hypertension Brother   . 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 Smoking status: Former Smoker    Packs/day: 0.00    Types: Cigarettes    Start date: 11/12/1965    Quit date: 09/06/2008    Years since quitting: 12.0  . Smokeless tobacco: Never Used  Substance Use Topics  . Alcohol use: Yes    Alcohol/week: 1.0 standard drink    Types: 1 Glasses of wine per week    Comment: occ    Current Meds  Medication Sig  . Biotin 10000 MCG TABS Take 10,000 mcg by mouth daily.   Marland Kitchen buPROPion (WELLBUTRIN SR) 150 MG 12 hr tablet Take 1 tablet (150 mg total) by mouth 2 (two) times daily.  . Cholecalciferol (VITAMIN D3) 125 MCG (5000 UT) TABS Take 15,000 Units by mouth daily.   . Coenzyme Q10 100 MG capsule Take  100 mg by mouth daily.   . Cyanocobalamin (VITAMIN B-12) 2500 MCG SUBL Place 2,500 mcg under the tongue daily.  Marland Kitchen docusate sodium (COLACE) 100 MG capsule Take 100 mg by mouth 2 (two) times daily.   Marland Kitchen estradiol (ESTRACE) 2 MG tablet Take 1 tablet by mouth daily  . lidocaine (XYLOCAINE) 5 % ointment Apply 1 application topically 3 (three) times daily as needed.  . NP THYROID 120 MG tablet Take 1 tablet (120 mg total) by mouth daily before breakfast.  . NP THYROID 60 MG tablet Take 1 tablet (60 mg total) by mouth daily before lunch.  . oxyCODONE-acetaminophen (PERCOCET/ROXICET) 5-325 MG tablet TAKE 1 TABLET DAILY AS NEEDED FOR SEVERE PAIN.  Marland Kitchen progesterone (PROMETRIUM) 200 MG capsule Take 1 capsule (200 mg total) by mouth daily.  . Testosterone 20 % CREA Apply 1 application topically daily.   Marland Kitchen warfarin (COUMADIN) 5 MG tablet Take 2 tablets by mouth daily or as  directed. (Patient taking differently: Take 1 tablet daily except on Monday and Thursday she is to take 2 tablets daily)  . [DISCONTINUED] ALPRAZolam (XANAX) 0.5 MG tablet Take 1 tablet (0.5 mg total) by mouth 2 (two) times daily as needed for anxiety. Take 2 or three before MRI      Depression screen [facility] 2/9 03/31/2020 12/14/2019 12/16/2017 09/24/2017 03/07/2017  Decreased Interest 0 0 0 0 0  Down, Depressed, Hopeless 0 0 0 0 0  PHQ - 2 Score 0 0 0 0 0     Objective:   Today's Vitals: BP 130/70 (BP Location: Left Arm, Patient Position: Sitting, Cuff Size: Normal)   Pulse 77   Temp (!) 97.3 F (36.3 C) (Temporal)   Resp 18   Ht 5\' 4"  (1.626 m)   Wt 158 lb (71.7 kg) Comment: winter clothes /boots  LMP 07/08/1979 (Approximate)   SpO2 99%   BMI 27.12 kg/m  Vitals with BMI 09/13/2020 06/08/2020 06/05/2020  Height 5\' 4"  5\' 4"  -  Weight 158 lbs 156 lbs -  BMI 58.85 02.77 -  Systolic 412 878 676  Diastolic 70 59 76  Pulse 77 74 56     Physical Exam   She looks systemically well.  She remains overweight and has gained some weight since the last time seen here.  Blood pressure in a good range.    Assessment   1. Primary ovarian failure   2. Hyperlipidemia, unspecified hyperlipidemia type   3. Malaise and fatigue       Tests ordered Orders Placed This Encounter  Procedures  . Estradiol  . Progesterone  . Lipid panel     Plan: 1. Blood work is taken. 2. She will start testosterone therapy again and I will call this into [facility] 5 mg apply to the labia daily of testosterone 2% cream. 3. She will continue with estradiol and progesterone at the current doses.  She was unable to tolerate higher dose of progesterone.  Thankfully, she does not have a uterus. 4. Further recommendations will depend on blood results and we discussed the concept of hormone optimization and resultant beneficial in reduction of visceral fat and thereby helping her  hyperlipidemia. 5. Follow-up in about 6 weeks time.  She was given high-dose influenza vaccination today.   Meds ordered this encounter  Medications  . Testosterone Propionate (FIRST-TESTOSTERONE MC) 2 % CREA    Sig: Place 5 mg onto the skin daily.    Dispense:  30 g    Refill:  0  Doree Albee, MD

## 2020-09-14 ENCOUNTER — Ambulatory Visit (INDEPENDENT_AMBULATORY_CARE_PROVIDER_SITE_OTHER): Payer: Medicare Other | Admitting: Neurology

## 2020-09-14 VITALS — BP 144/76 | HR 53 | Ht 64.0 in | Wt 161.0 lb

## 2020-09-14 DIAGNOSIS — M7918 Myalgia, other site: Secondary | ICD-10-CM

## 2020-09-14 DIAGNOSIS — I25119 Atherosclerotic heart disease of native coronary artery with unspecified angina pectoris: Secondary | ICD-10-CM

## 2020-09-14 DIAGNOSIS — R419 Unspecified symptoms and signs involving cognitive functions and awareness: Secondary | ICD-10-CM | POA: Diagnosis not present

## 2020-09-14 DIAGNOSIS — M546 Pain in thoracic spine: Secondary | ICD-10-CM | POA: Diagnosis not present

## 2020-09-14 DIAGNOSIS — G8929 Other chronic pain: Secondary | ICD-10-CM | POA: Diagnosis not present

## 2020-09-14 DIAGNOSIS — M62838 Other muscle spasm: Secondary | ICD-10-CM | POA: Diagnosis not present

## 2020-09-14 DIAGNOSIS — I480 Paroxysmal atrial fibrillation: Secondary | ICD-10-CM

## 2020-09-14 LAB — LIPID PANEL
Cholesterol: 312 mg/dL — ABNORMAL HIGH (ref <200)
HDL: 71 mg/dL (ref >=50)
LDL Cholesterol (Calc): 212 mg/dL (calc) — ABNORMAL HIGH
Non-HDL Cholesterol (Calc): 241 mg/dL (calc) — ABNORMAL HIGH (ref <130)
Total CHOL/HDL Ratio: 4.4 (calc) (ref <5.0)
Triglycerides: 134 mg/dL (ref <150)

## 2020-09-14 LAB — PROGESTERONE: Progesterone: 10.9 ng/mL

## 2020-09-14 LAB — ESTRADIOL: Estradiol: 86 pg/mL

## 2020-09-14 MED ORDER — DIAZEPAM 5 MG PO TABS
5.0000 mg | ORAL_TABLET | Freq: Every day | ORAL | 0 refills | Status: DC | PRN
Start: 2020-09-14 — End: 2021-04-27

## 2020-09-14 NOTE — Progress Notes (Signed)
[facility]  PATIENT: Heather [name] DOB: 1946-03-10  REFERRING DOCTOR OR PCP:  Blanchie Serve SOURCE: patient  _________________________________   HISTORICAL  CHIEF COMPLAINT:  Chief Complaint  Patient presents with  . Botulinum Toxin Injection    Rm 13, alone. Here for botox. Buy/Bill. 100U x 1 vial. Lot: [id number]. Exp: 12/2022. NDC: 8101-7510-25     HISTORY OF PRESENT ILLNESS:  Heather [name] is a 74 y.o. woman who has had chronic right-sided mid back pain since 2013.      Update 06/08/2020: She is reporting less musculoskeletal pain after her last Botox.   She has had no pain x 6 weeks.  Pain, when present is mostly in the right flank and she gets a benefit from Botox.  Marland Kitchen  Percocet did not help.   She went to the ED and had an xray and had IV morphine with short term benefit.   Lidocaine ointment caused an allergic reaction.   Finally prednisone helped some.      She has rare insomnia when pain is worse and will rarely take diazepam (usually just one or two a month).  She never combines with a percocet.    She is noting more issues about memory loss and other cognitive issues.  She feels processing is slower and she has word finding issues.   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    Montreal Cognitive Assessment  09/14/2020  Visuospatial/ Executive (0/5) 3  Naming (0/3) 3  Attention: Read list of digits (0/2) 2  Attention: Read list of letters (0/1) 1  Attention: Serial 7 subtraction starting at 100 (0/3) 3  Language: Repeat phrase (0/2) 2  Language : Fluency (0/1) 1  Abstraction (0/2) 2  Delayed Recall (0/5) 5  Orientation (0/6) 6  Total 28  Adjusted Score (based on education) 28    She is on coumadin for recently diagnose atrial fibrillation.      MRI results from 10/14/2013: The MRI of the brain showed age related atrophy and minimal chronic microvascular ischemic change. MRI of the cervical spine showed multilevel mild  degenerative changes with left paramedian disc herniation at C3-C4 and left disc protrusion at C4-C5 and C5-C6 and midline disc herniation at C6-C7. There was no report of nerve root compression. MRI of the thoracic spine showed disc desiccation but no herniation or protrusions. MRI of the lumbar spine showed disc bulges at T12-L1 and L2-L3 and disc bulge with facet hypertrophy at L3-L4 and disc bulge with right foraminal annular tear at L4-L5.   REVIEW OF SYSTEMS: Constitutional: No fevers, chills, sweats, or change in appetite Eyes: No visual changes, double vision, eye pain Ear, nose and throat: No hearing loss, ear pain, nasal congestion, sore throat Cardiovascular: No chest pain, palpitations.   She has atrial fibrillation and is on warfarin Respiratory: No shortness of breath at rest or with exertion.   No wheezes GastrointestinaI: No nausea, vomiting, diarrhea, abdominal pain, fecal incontinence Genitourinary: No dysuria, urinary retention or frequency.  No nocturia. Musculoskeletal: No neck pain.  She has back pain/thoracic pain as above Integumentary: No rash, pruritus, skin lesions Neurological: as above Psychiatric: No depression at this time.  No anxiety Endocrine: No palpitations, diaphoresis, change in appetite, change in weigh or increased thirst Hematologic/Lymphatic: No anemia, purpura, petechiae. Allergic/Immunologic: No itchy/runny eyes, nasal congestion, recent allergic reactions, rashes  ALLERGIES: Allergies  Allergen Reactions  . Statins Other (See Comments)    Intolerant all statins  . Sulfa  Antibiotics Rash    HOME MEDICATIONS:  Current Outpatient Medications:  .  Biotin 10000 MCG TABS, Take 10,000 mcg by mouth daily. , Disp: , Rfl:  .  buPROPion (WELLBUTRIN SR) 150 MG 12 hr tablet, Take 1 tablet (150 mg total) by mouth 2 (two) times daily., Disp: 180 tablet, Rfl: 1 .  Cholecalciferol (VITAMIN D3) 125 MCG (5000 UT) TABS, Take 15,000 Units by mouth daily. ,  Disp: , Rfl:  .  Coenzyme Q10 100 MG capsule, Take 100 mg by mouth daily. , Disp: , Rfl:  .  Cyanocobalamin (VITAMIN B-12) 2500 MCG SUBL, Place 2,500 mcg under the tongue daily., Disp: , Rfl:  .  docusate sodium (COLACE) 100 MG capsule, Take 100 mg by mouth 2 (two) times daily. , Disp: , Rfl:  .  estradiol (ESTRACE) 2 MG tablet, Take 1 tablet by mouth daily, Disp: 90 tablet, Rfl: 1 .  lidocaine (XYLOCAINE) 5 % ointment, Apply 1 application topically 3 (three) times daily as needed., Disp: 35.44 g, Rfl: 0 .  NP THYROID 120 MG tablet, Take 1 tablet (120 mg total) by mouth daily before breakfast., Disp: 90 tablet, Rfl: 1 .  NP THYROID 60 MG tablet, Take 1 tablet (60 mg total) by mouth daily before lunch., Disp: 90 tablet, Rfl: 1 .  oxyCODONE-acetaminophen (PERCOCET/ROXICET) 5-325 MG tablet, TAKE 1 TABLET DAILY AS NEEDED FOR SEVERE PAIN., Disp: 30 tablet, Rfl: 0 .  progesterone (PROMETRIUM) 200 MG capsule, Take 1 capsule (200 mg total) by mouth daily., Disp: 90 capsule, Rfl: 1 .  Testosterone 20 % CREA, Apply 1 application topically daily. , Disp: , Rfl:  .  Testosterone Propionate (FIRST-TESTOSTERONE MC) 2 % CREA, Place 5 mg onto the skin daily., Disp: 30 g, Rfl: 0 .  warfarin (COUMADIN) 5 MG tablet, Take 2 tablets by mouth daily or as directed. (Patient taking differently: Take 1 tablet daily except on Monday and Thursday she is to take 2 tablets daily), Disp: 180 tablet, Rfl: 1 .  diazepam (VALIUM) 5 MG tablet, Take 1 tablet (5 mg total) by mouth daily as needed for anxiety., Disp: 30 tablet, Rfl: 0  PAST MEDICAL HISTORY: Past Medical History:  Diagnosis Date  . Adrenal adenoma   . Allergy   . Anemia   . Anxiety   . CAD (coronary artery disease)    50-60% mid LAD at cardiac catheterization 2014 [facility])  . Cataract   . Depression   . DJD (degenerative joint disease) of cervical spine 09/12/2016  . History of cardiomyopathy   . History of tobacco use   . Hyperlipidemia   .  Neuromuscular disorder ([place])   . PAF (paroxysmal atrial fibrillation) [facility])    Diagnosis July 2017 - spontaneously resolved  . Statin intolerance     PAST SURGICAL HISTORY: Past Surgical History:  Procedure Laterality Date  . ABDOMINAL HYSTERECTOMY  1980   endometriosis  . CATARACT EXTRACTION Left 11/2018  . LEFT HEART CATH AND CORONARY ANGIOGRAPHY N/A 09/25/2018   Procedure: LEFT HEART CATH AND CORONARY ANGIOGRAPHY;  Surgeon: Belva Crome, MD;  Location: [place] [facility];  Service: Cardiovascular;  Laterality: N/A;  . TONSILLECTOMY  1954    FAMILY HISTORY: Family History  Problem Relation Age of Onset  . COPD Mother   . Hearing loss Mother   . Stroke Mother   . Heart disease Mother        chf, died at 58  . Arthritis Father   . Hypertension Father   .  Heart disease Father        cad, pvd, died at 75  . Heart disease Sister        heart valve  . Arthritis Sister   . Hyperlipidemia Brother   . Hypertension Brother   . Cancer - Prostate Brother     SOCIAL HISTORY:  Social History   Socioeconomic History  . Marital status: Married    Spouse name: Al  . Number of children: 0  . Years of education: 61  . Highest education level: Not on file  Occupational History  . Occupation: Probation officer    Comment: from home  Tobacco Use  . Smoking status: Former Smoker    Packs/day: 0.00    Types: Cigarettes    Start date: 11/12/1965    Quit date: 09/06/2008    Years since quitting: 12.0  . Smokeless tobacco: Never Used  Vaping Use  . Vaping Use: Never used  Substance and Sexual Activity  . Alcohol use: Yes    Alcohol/week: 1.0 standard drink    Types: 1 Glasses of wine per week    Comment: occ  . Drug use: Yes    Types: Marijuana    Comment: rarely  . Sexual activity: Yes    Birth control/protection: Post-menopausal  Other Topics Concern  . 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 Difficulty of Paying Living Expenses: Not on file  Food Insecurity:   . Worried About Charity fundraiser in the Last Year: Not on file  . Ran Out of Food in the Last Year: Not on file  Transportation Needs:   . Lack of Transportation (Medical): Not on file  . Lack of Transportation (Non-Medical): Not on file  Physical Activity:   . Days of Exercise per Week: Not on file  . Minutes of Exercise per Session: Not on file  Stress:   . Feeling of Stress : Not on file  Social Connections:   . Frequency of Communication with Friends and Family: Not on file  . Frequency of Social Gatherings with Friends and Family: Not on file  . Attends Religious Services: Not on file  . Active Member of Clubs or Organizations: Not on file  . Attends Archivist Meetings: Not on file  . Marital Status: Not on file  Intimate Partner Violence:   . Fear of Current or Ex-Partner: Not on file  . Emotionally Abused: Not on file  . Physically Abused: Not on file  . Sexually Abused: Not on file     PHYSICAL EXAM  Vitals:   09/14/20 1031  BP: (!) 144/76  Pulse: (!) 53  Weight: 161 lb (73 kg)  Height: 5\' 4"  (1.626 m)    Body mass index is 27.64 kg/m.   General: The patient is well-developed and well-nourished and in no acute distress.  Head is normocephalic and atraumatic.   Musculoskeletal : She has tenderness over the right C7 paraspinal muscle, infraspinatus, rhomboid, T7-T8 paraspinal and L3 paraspinal muscles she has tenderness in the mid to lower thoracic intracoastal region from T7--T9 on the right.  There is no tenderness on the left or in the neck or lower back.  Range of motion is normal in the spine.  Neurologic Exam  Mental status: The patient is alert and oriented  x 3 at the time of the examination.  She scored 28/30 on the [place] (details above).  Focus and attention seem normal.   Speech is normal.  Cranial  nerves: Extraocular muscles are intact.  Facial strength is normal.   . No obvious hearing deficits are noted.  Motor: She has normal muscle tone, muscle bulk and muscle strength in the arms or legs.  Sensation: Intact   gait and station: Gait and station are normal.     ASSESSMENT AND PLAN  1. Myofascial pain syndrome   2. Muscle spasm   3. Chronic right-sided thoracic back pain   4. PAF (paroxysmal atrial fibrillation) ([place])   5. Cognitive complaints      1.  Inject 100 units Botox split into tender points below the T6-T9 ribs into the intercostal space.  There were no complications and she tolerated the injections well.    2.   Stay active.  Exercise as tolerated.   3.   We discussed that her performance on the [facility] cognitive assessment (28/30, losing 2 points for visual-spatial tasks) is still within the normal range.  If she continues to note more issues we may refer for formal neurocognitive testing.   4.   She will return in 3-4 months or sooner if  new or worsening neurologic symptoms.   Sheriff Rodenberg A. Felecia Shelling, MD, PhD 71/04/9677, 93:81 AM Certified in Neurology, Clinical Neurophysiology, Sleep Medicine, Pain Medicine and Neuroimaging  [facility] [address] [place], [place] [address] [phone]

## 2020-09-19 DIAGNOSIS — H40013 Open angle with borderline findings, low risk, bilateral: Secondary | ICD-10-CM | POA: Diagnosis not present

## 2020-09-19 DIAGNOSIS — H31001 Unspecified chorioretinal scars, right eye: Secondary | ICD-10-CM | POA: Diagnosis not present

## 2020-09-19 DIAGNOSIS — H35373 Puckering of macula, bilateral: Secondary | ICD-10-CM | POA: Diagnosis not present

## 2020-09-19 DIAGNOSIS — Z961 Presence of intraocular lens: Secondary | ICD-10-CM | POA: Diagnosis not present

## 2020-09-19 DIAGNOSIS — H2511 Age-related nuclear cataract, right eye: Secondary | ICD-10-CM | POA: Diagnosis not present

## 2020-09-19 DIAGNOSIS — H353131 Nonexudative age-related macular degeneration, bilateral, early dry stage: Secondary | ICD-10-CM | POA: Diagnosis not present

## 2020-09-20 ENCOUNTER — Ambulatory Visit (INDEPENDENT_AMBULATORY_CARE_PROVIDER_SITE_OTHER): Payer: Medicare Other | Admitting: Internal Medicine

## 2020-09-22 DIAGNOSIS — Z23 Encounter for immunization: Secondary | ICD-10-CM | POA: Diagnosis not present

## 2020-09-27 ENCOUNTER — Ambulatory Visit (INDEPENDENT_AMBULATORY_CARE_PROVIDER_SITE_OTHER): Payer: Medicare Other | Admitting: *Deleted

## 2020-09-27 DIAGNOSIS — I480 Paroxysmal atrial fibrillation: Secondary | ICD-10-CM | POA: Diagnosis not present

## 2020-09-27 DIAGNOSIS — Z5181 Encounter for therapeutic drug level monitoring: Secondary | ICD-10-CM | POA: Diagnosis not present

## 2020-09-27 LAB — POCT INR: INR: 2.2 (ref 2.0–3.0)

## 2020-09-27 NOTE — Patient Instructions (Signed)
Continue warfarin 1/2 tablets daily except 1 tablet on Sundays, Tuesdays and Thursdays Recheck in 6 weeks

## 2020-10-01 ENCOUNTER — Other Ambulatory Visit: Payer: Self-pay | Admitting: Cardiology

## 2020-10-21 DIAGNOSIS — H353132 Nonexudative age-related macular degeneration, bilateral, intermediate dry stage: Secondary | ICD-10-CM | POA: Diagnosis not present

## 2020-10-24 ENCOUNTER — Ambulatory Visit: Payer: Medicare Other | Admitting: Cardiology

## 2020-10-24 ENCOUNTER — Other Ambulatory Visit: Payer: Self-pay | Admitting: Neurology

## 2020-10-24 MED ORDER — OXYCODONE-ACETAMINOPHEN 5-325 MG PO TABS: ORAL_TABLET | ORAL | 0 refills | Status: DC

## 2020-10-24 NOTE — Telephone Encounter (Signed)
Pt request refill  oxyCODONE-acetaminophen (PERCOCET/ROXICET) 5-325 MG tablet at [facility] 

## 2020-10-31 ENCOUNTER — Encounter (INDEPENDENT_AMBULATORY_CARE_PROVIDER_SITE_OTHER): Payer: Self-pay | Admitting: Internal Medicine

## 2020-10-31 ENCOUNTER — Ambulatory Visit (INDEPENDENT_AMBULATORY_CARE_PROVIDER_SITE_OTHER): Payer: Medicare Other | Admitting: Internal Medicine

## 2020-10-31 ENCOUNTER — Other Ambulatory Visit: Payer: Self-pay

## 2020-10-31 VITALS — BP 110/74 | HR 62 | Temp 97.3°F | Ht 64.0 in | Wt 158.2 lb

## 2020-10-31 DIAGNOSIS — I25119 Atherosclerotic heart disease of native coronary artery with unspecified angina pectoris: Secondary | ICD-10-CM | POA: Diagnosis not present

## 2020-10-31 DIAGNOSIS — E785 Hyperlipidemia, unspecified: Secondary | ICD-10-CM

## 2020-10-31 DIAGNOSIS — R6882 Decreased libido: Secondary | ICD-10-CM

## 2020-10-31 DIAGNOSIS — E2839 Other primary ovarian failure: Secondary | ICD-10-CM | POA: Diagnosis not present

## 2020-10-31 MED ORDER — NP THYROID 120 MG PO TABS: 120.0000 mg | ORAL_TABLET | Freq: Every day | ORAL | 1 refills | Status: DC

## 2020-10-31 MED ORDER — NP THYROID 90 MG PO TABS: 90.0000 mg | ORAL_TABLET | Freq: Every day | ORAL | 1 refills | Status: DC

## 2020-10-31 NOTE — Progress Notes (Signed)
Metrics: Intervention Frequency ACO  Documented Smoking Status Yearly  Screened one or more times in 24 months  Cessation Counseling or  Active cessation medication Past 24 months  Past 24 months   Guideline developer: UpToDate (See UpToDate for funding source) Date Released: 2014       Wellness Office Visit  Subjective:  Patient ID: Heather [name], female    DOB: 1946-03-01  Age: 74 y.o. MRN: [id number]  CC: This delightful lady comes in for follow-up of significant hyperlipidemia, bioidentical hormone therapy, testosterone therapy. HPI  Overall, she is doing well.  She has tolerated the higher dose of desiccated NP thyroid which is being used off label for improvement of insulin resistance and symptoms of thyroid hypofunction.  She is now taking NP thyroid 120 mg in the morning and NP thyroid 60 mg in the afternoon alternating with NP thyroid 90 mg in the afternoon.She has tolerated the higher NP thyroid 90 mg in the afternoon. She continues on estradiol 2 mg daily and progesterone 200 mg at night. She is taking testosterone but not every day. Past Medical History:  Diagnosis Date  . Adrenal adenoma   . Allergy   . Anemia   . Anxiety   . CAD (coronary artery disease)    50-60% mid LAD at cardiac catheterization 2014 [facility])  . Cataract   . Depression   . DJD (degenerative joint disease) of cervical spine 09/12/2016  . History of cardiomyopathy   . History of tobacco use   . Hyperlipidemia   . Neuromuscular disorder ([place])   . PAF (paroxysmal atrial fibrillation) [facility])    Diagnosis July 2017 - spontaneously resolved  . Statin intolerance    Past Surgical History:  Procedure Laterality Date  . ABDOMINAL HYSTERECTOMY  1980   endometriosis  . CATARACT EXTRACTION Left 11/2018  . LEFT HEART CATH AND CORONARY ANGIOGRAPHY N/A 09/25/2018   Procedure: LEFT HEART CATH AND CORONARY ANGIOGRAPHY;  Surgeon: Belva Crome, MD;  Location: [place] [facility];  Service:  Cardiovascular;  Laterality: N/A;  . TONSILLECTOMY  1954     Family History  Problem Relation Age of Onset  . COPD Mother   . Hearing loss Mother   . Stroke Mother   . Heart disease Mother        chf, died at 19  . Arthritis Father   . Hypertension Father   . Heart disease Father        cad, pvd, died at 51  . Heart disease Sister        heart valve  . Arthritis Sister   . Hyperlipidemia Brother   . Hypertension Brother   . 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 Smoking status: Former Smoker    Packs/day: 0.00    Types: Cigarettes    Start date: 11/12/1965    Quit date: 09/06/2008    Years since quitting: 12.1  . Smokeless tobacco: Never Used  Substance Use Topics  . Alcohol use: Yes    Alcohol/week: 1.0 standard drink    Types: 1 Glasses of wine per week    Comment: occ    Current Meds  Medication Sig  . Biotin 10000 MCG TABS Take 10,000 mcg by mouth daily.   Marland Kitchen buPROPion [facility] SR) 150  MG 12 hr tablet Take 1 tablet (150 mg total) by mouth 2 (two) times daily.  . Cholecalciferol (VITAMIN D3) 125 MCG (5000 UT) TABS Take 15,000 Units by mouth daily.   . Coenzyme Q10 100 MG capsule Take 100 mg by mouth daily.   . Cyanocobalamin (VITAMIN B-12) 2500 MCG SUBL Place 2,500 mcg under the tongue daily.  . diazepam (VALIUM) 5 MG tablet Take 1 tablet (5 mg total) by mouth daily as needed for anxiety.  . docusate sodium (COLACE) 100 MG capsule Take 100 mg by mouth 2 (two) times daily.   Marland Kitchen estradiol (ESTRACE) 2 MG tablet Take 1 tablet by mouth daily  . lidocaine (XYLOCAINE) 5 % ointment Apply 1 application topically 3 (three) times daily as needed.  . nitroGLYCERIN (NITROSTAT) 0.4 MG SL tablet Place 1 tablet under the tongue every 5 minutes as needed for chest pain.  Marland Kitchen oxyCODONE-acetaminophen (PERCOCET/ROXICET) 5-325  MG tablet TAKE 1 TABLET DAILY AS NEEDED FOR SEVERE PAIN.  Marland Kitchen progesterone (PROMETRIUM) 200 MG capsule Take 1 capsule (200 mg total) by mouth daily.  . Testosterone 20 % CREA Apply 1 application topically daily.   . Testosterone Propionate (FIRST-TESTOSTERONE MC) 2 % CREA Place 5 mg onto the skin daily.  Marland Kitchen warfarin (COUMADIN) 5 MG tablet Take 2 tablets by mouth daily or as directed. (Patient taking differently: Take 1 tablet daily except on Monday and Thursday she is to take 2 tablets daily)  . [DISCONTINUED] NP THYROID 120 MG tablet Take 1 tablet (120 mg total) by mouth daily before breakfast.  . [DISCONTINUED] NP THYROID 60 MG tablet Take 1 tablet (60 mg total) by mouth daily before lunch.      Depression screen [facility] 2/9 03/31/2020 12/14/2019 12/16/2017 09/24/2017 03/07/2017  Decreased Interest 0 0 0 0 0  Down, Depressed, Hopeless 0 0 0 0 0  PHQ - 2 Score 0 0 0 0 0     Objective:   Today's Vitals: BP 110/74   Pulse 62   Temp (!) 97.3 F (36.3 C) (Temporal)   Ht 5\' 4"  (1.626 m)   Wt 158 lb 3.2 oz (71.8 kg)   LMP 07/08/1979 (Approximate)   SpO2 96%   BMI 27.15 kg/m  Vitals with BMI 10/31/2020 09/14/2020 09/13/2020  Height 5\' 4"  5\' 4"  5\' 4"   Weight 158 lbs 3 oz 161 lbs 158 lbs  BMI 27.14 02.72 53.66  Systolic 440 347 425  Diastolic 74 76 70  Pulse 62 53 77     Physical Exam   She looks systemically well.  She has lost a few pounds since the last visit.  Blood pressure is excellent.    Assessment   1. Hyperlipidemia, unspecified hyperlipidemia type   2. Primary ovarian failure   3. Decreased libido       Tests ordered No orders of the defined types were placed in this encounter.    Plan: 1. I think we can further optimize her thyroid so I want to take NP thyroid 120 mg in the morning and now NP thyroid 90 mg in the afternoon every day.  I have sent new prescription to reflect these changes. 2. She will continue with estradiol and progesterone as before. 3. She will  continue with testosterone as before. 4. Follow-up in 2 months time to see how she is doing and we will likely do some blood work then.   Meds ordered this encounter  Medications  . NP THYROID 120 MG tablet    Sig:  Take 1 tablet (120 mg total) by mouth daily before breakfast.    Dispense:  90 tablet    Refill:  1  . NP THYROID 90 MG tablet    Sig: Take 1 tablet (90 mg total) by mouth daily.    Dispense:  90 tablet    Refill:  1    Jakin Pavao Luther Parody, MD

## 2020-11-08 ENCOUNTER — Ambulatory Visit (INDEPENDENT_AMBULATORY_CARE_PROVIDER_SITE_OTHER): Payer: Medicare Other | Admitting: *Deleted

## 2020-11-08 ENCOUNTER — Other Ambulatory Visit: Payer: Self-pay

## 2020-11-08 DIAGNOSIS — Z5181 Encounter for therapeutic drug level monitoring: Secondary | ICD-10-CM | POA: Diagnosis not present

## 2020-11-08 DIAGNOSIS — I480 Paroxysmal atrial fibrillation: Secondary | ICD-10-CM

## 2020-11-08 LAB — POCT INR: INR: 1.6 — AB (ref 2.0–3.0)

## 2020-11-08 NOTE — Patient Instructions (Signed)
Take 2 tablets tonight then increase dose to 1 1/2 tablets daily except 1 tablet on Sundays and Thursdays Recheck in 3 weeks

## 2020-11-20 DIAGNOSIS — H353132 Nonexudative age-related macular degeneration, bilateral, intermediate dry stage: Secondary | ICD-10-CM | POA: Diagnosis not present

## 2020-11-27 ENCOUNTER — Other Ambulatory Visit (INDEPENDENT_AMBULATORY_CARE_PROVIDER_SITE_OTHER): Payer: Self-pay | Admitting: Internal Medicine

## 2020-12-05 ENCOUNTER — Other Ambulatory Visit (INDEPENDENT_AMBULATORY_CARE_PROVIDER_SITE_OTHER): Payer: Self-pay | Admitting: Internal Medicine

## 2020-12-08 ENCOUNTER — Other Ambulatory Visit: Payer: Self-pay | Admitting: Cardiology

## 2020-12-11 DIAGNOSIS — H2511 Age-related nuclear cataract, right eye: Secondary | ICD-10-CM | POA: Diagnosis not present

## 2020-12-11 DIAGNOSIS — H5211 Myopia, right eye: Secondary | ICD-10-CM | POA: Diagnosis not present

## 2020-12-15 DIAGNOSIS — H2511 Age-related nuclear cataract, right eye: Secondary | ICD-10-CM | POA: Diagnosis not present

## 2020-12-19 ENCOUNTER — Telehealth: Payer: Self-pay | Admitting: Neurology

## 2020-12-19 ENCOUNTER — Other Ambulatory Visit: Payer: Self-pay | Admitting: Neurology

## 2020-12-19 MED ORDER — OXYCODONE-ACETAMINOPHEN 5-325 MG PO TABS: ORAL_TABLET | ORAL | 0 refills | Status: DC

## 2020-12-19 NOTE — Telephone Encounter (Signed)
@  10:29 pt left vm asking for a refill on her oxyCODONE-acetaminophen (PERCOCET/ROXICET) 5-325 MG tablet [place]

## 2020-12-19 NOTE — Addendum Note (Signed)
Addended by: Roberts Gaudy L on: 12/19/2020 11:07 AM   Modules accepted: Orders

## 2020-12-19 NOTE — Telephone Encounter (Signed)
Heather [name] - the pt is looking for refill on the percocet she did leave a message earlier but I was calling doing a reminder call and told her I would advise. She wants to get the script called into [facility]. Thank you

## 2020-12-20 DIAGNOSIS — H353132 Nonexudative age-related macular degeneration, bilateral, intermediate dry stage: Secondary | ICD-10-CM | POA: Diagnosis not present

## 2020-12-22 ENCOUNTER — Ambulatory Visit (INDEPENDENT_AMBULATORY_CARE_PROVIDER_SITE_OTHER): Payer: Medicare Other | Admitting: Neurology

## 2020-12-22 ENCOUNTER — Other Ambulatory Visit: Payer: Self-pay

## 2020-12-22 ENCOUNTER — Encounter: Payer: Self-pay | Admitting: Neurology

## 2020-12-22 VITALS — BP 118/70 | HR 70 | Ht 64.0 in | Wt 157.0 lb

## 2020-12-22 DIAGNOSIS — M5416 Radiculopathy, lumbar region: Secondary | ICD-10-CM

## 2020-12-22 DIAGNOSIS — G8929 Other chronic pain: Secondary | ICD-10-CM

## 2020-12-22 DIAGNOSIS — M62838 Other muscle spasm: Secondary | ICD-10-CM | POA: Diagnosis not present

## 2020-12-22 DIAGNOSIS — M7918 Myalgia, other site: Secondary | ICD-10-CM | POA: Diagnosis not present

## 2020-12-22 DIAGNOSIS — M546 Pain in thoracic spine: Secondary | ICD-10-CM

## 2020-12-22 NOTE — Progress Notes (Signed)
[facility]  PATIENT: Heather [name] DOB: [birth year]  REFERRING DOCTOR OR PCP:  Blanchie Serve SOURCE: patient  _________________________________   HISTORICAL  CHIEF COMPLAINT:  Chief Complaint  Patient presents with  . Botulinum Toxin Injection    RM 12,alone. 100Ux1vial. Lot: [id number]. Exp: 12/2022. [place]: [id number]. Made next appt for 03/22/2021 at 1:30pm.  Pt reports new sx of right knee collapsing. Wondering if sciatic issues can cause this.    HISTORY OF PRESENT ILLNESS:  Heather [name] is a 75 y.o. woman who has had chronic right-sided mid back pain since 2013.      Update 12/22/20 She is reporting less musculoskeletal pain after her last Botox.   The benefit was excellent for 2 months and partial for the last month pain, when present is mostly in the right flank and she gets a benefit from Botox.  Marland Kitchen  Percocet did not help.   She went to the ED and had an xray and had IV morphine with short term benefit.   Lidocaine ointment caused an allergic reaction.   Finally prednisone helped some.      She has rare insomnia when pain is worse and will rarely take diazepam (usually just one or two a month).  She never combines with a percocet.    She has had some episodes of the right leg buckling at times.  She needs the bannister now to go upstairs. She has reduced rise from squat. She also notes a pain going into the right leg but not to the ankle.   She notes it more at night.   Lumbar MRI in 2014 showed DJD at L3-L4 with spondylolisthesis and possible effects on L3 nerve roots.    She has noticed some difficulties with memory loss but scored 28/30 on the [facility] cognitive assessment at the last visit.  She has recently written a book. She feels processing is slower and she has word finding issues.   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    She is on coumadin for recently diagnose atrial fibrillation.      MRI results from 10/14/2013: The MRI of the  brain showed age related atrophy and minimal chronic microvascular ischemic change. MRI of the cervical spine showed multilevel mild degenerative changes with left paramedian disc herniation at C3-C4 and left disc protrusion at C4-C5 and C5-C6 and midline disc herniation at C6-C7. There was no report of nerve root compression. MRI of the thoracic spine showed disc desiccation but no herniation or protrusions. MRI of the lumbar spine showed disc bulges at T12-L1 and L2-L3 and disc bulge with facet hypertrophy at L3-L4 and disc bulge with right foraminal annular tear at L4-L5.   REVIEW OF SYSTEMS: Constitutional: No fevers, chills, sweats, or change in appetite Eyes: No visual changes, double vision, eye pain Ear, nose and throat: No hearing loss, ear pain, nasal congestion, sore throat Cardiovascular: No chest pain, palpitations.   She has atrial fibrillation and is on warfarin Respiratory: No shortness of breath at rest or with exertion.   No wheezes GastrointestinaI: No nausea, vomiting, diarrhea, abdominal pain, fecal incontinence Genitourinary: No dysuria, urinary retention or frequency.  No nocturia. Musculoskeletal: No neck pain.  She has back pain/thoracic pain as above Integumentary: No rash, pruritus, skin lesions Neurological: as above Psychiatric: No depression at this time.  No anxiety Endocrine: No palpitations, diaphoresis, change in appetite, change in weigh or increased thirst Hematologic/Lymphatic: No anemia, purpura, petechiae. Allergic/Immunologic: No itchy/runny eyes, nasal  congestion, recent allergic reactions, rashes  ALLERGIES: Allergies  Allergen Reactions  . Statins Other (See Comments)    Intolerant all statins  . Sulfa Antibiotics Rash    HOME MEDICATIONS:  Current Outpatient Medications:  .  Biotin 10000 MCG TABS, Take 10,000 mcg by mouth daily. , Disp: , Rfl:  .  buPROPion (WELLBUTRIN SR) 150 MG 12 hr tablet, TAKE ONE TABLET BY MOUTH TWICE A DAY, Disp:  180 tablet, Rfl: 1 .  Cholecalciferol (VITAMIN D3) 125 MCG (5000 UT) TABS, Take 15,000 Units by mouth daily. , Disp: , Rfl:  .  Coenzyme Q10 100 MG capsule, Take 100 mg by mouth daily. , Disp: , Rfl:  .  Cyanocobalamin (VITAMIN B-12) 2500 MCG SUBL, Place 2,500 mcg under the tongue daily., Disp: , Rfl:  .  diazepam (VALIUM) 5 MG tablet, Take 1 tablet (5 mg total) by mouth daily as needed for anxiety., Disp: 30 tablet, Rfl: 0 .  docusate sodium (COLACE) 100 MG capsule, Take 100 mg by mouth 2 (two) times daily. , Disp: , Rfl:  .  estradiol (ESTRACE) 2 MG tablet, Take 1 tablet by mouth daily, Disp: 30 tablet, Rfl: 4 .  lidocaine (XYLOCAINE) 5 % ointment, Apply 1 application topically 3 (three) times daily as needed., Disp: 35.44 g, Rfl: 0 .  methylPREDNISolone (MEDROL DOSEPAK) 4 MG TBPK tablet, Take by mouth as directed., Disp: , Rfl:  .  nitroGLYCERIN (NITROSTAT) 0.4 MG SL tablet, Place 1 tablet under the tongue every 5 minutes as needed for chest pain., Disp: 25 tablet, Rfl: 2 .  NP THYROID 120 MG tablet, Take 1 tablet (120 mg total) by mouth daily before breakfast., Disp: 90 tablet, Rfl: 1 .  NP THYROID 90 MG tablet, Take 1 tablet (90 mg total) by mouth daily., Disp: 90 tablet, Rfl: 1 .  oxyCODONE-acetaminophen (PERCOCET/ROXICET) 5-325 MG tablet, TAKE 1 TABLET DAILY AS NEEDED FOR SEVERE PAIN., Disp: 30 tablet, Rfl: 0 .  progesterone (PROMETRIUM) 200 MG capsule, Take 1 capsule (200 mg total) by mouth daily., Disp: 90 capsule, Rfl: 1 .  Testosterone 20 % CREA, Apply 1 application topically daily. , Disp: , Rfl:  .  Testosterone Propionate (FIRST-TESTOSTERONE MC) 2 % CREA, Place 5 mg onto the skin daily., Disp: 30 g, Rfl: 0 .  warfarin (COUMADIN) 5 MG tablet, Take 2 tablets by mouth daily or as directed. (Patient taking differently: Take 1.5 tablet daily except on Monday and Thursday she is to take 1 tablets daily), Disp: 180 tablet, Rfl: 1  PAST MEDICAL HISTORY: Past Medical History:  Diagnosis Date   . Adrenal adenoma   . Allergy   . Anemia   . Anxiety   . CAD (coronary artery disease)    50-60% mid LAD at cardiac catheterization 2014 [facility])  . Cataract   . Depression   . DJD (degenerative joint disease) of cervical spine 09/12/2016  . History of cardiomyopathy   . History of tobacco use   . Hyperlipidemia   . Neuromuscular disorder ([place])   . PAF (paroxysmal atrial fibrillation) [facility])    Diagnosis July 2017 - spontaneously resolved  . Statin intolerance     PAST SURGICAL HISTORY: Past Surgical History:  Procedure Laterality Date  . ABDOMINAL HYSTERECTOMY  1980   endometriosis  . CATARACT EXTRACTION Left 11/2018  . LEFT HEART CATH AND CORONARY ANGIOGRAPHY N/A 09/25/2018   Procedure: LEFT HEART CATH AND CORONARY ANGIOGRAPHY;  Surgeon: Belva Crome, MD;  Location: [facility];  Service:  Cardiovascular;  Laterality: N/A;  . TONSILLECTOMY  1954    FAMILY HISTORY: Family History  Problem Relation Age of Onset  . COPD Mother   . Hearing loss Mother   . Stroke Mother   . Heart disease Mother        chf, died at 76  . Arthritis Father   . Hypertension Father   . Heart disease Father        cad, pvd, died at 61  . Heart disease Sister        heart valve  . Arthritis Sister   . Hyperlipidemia Brother   . Hypertension Brother   . Cancer - Prostate Brother     SOCIAL HISTORY:  Social History   Socioeconomic History  . Marital status: Married    Spouse name: Al  . Number of children: 0  . Years of education: 73  . Highest education level: Not on file  Occupational History  . Occupation: Probation officer    Comment: from home  Tobacco Use  . Smoking status: Former Smoker    Packs/day: 0.00    Types: Cigarettes    Start date: 11/12/1965    Quit date: 09/06/2008    Years since quitting: 12.3  . Smokeless tobacco: Never Used  Vaping Use  . Vaping Use: Never used  Substance and Sexual Activity  . Alcohol use: Yes    Alcohol/week: 1.0 standard drink     Types: 1 Glasses of wine per week    Comment: occ  . Drug use: Yes    Types: Marijuana    Comment: rarely  . Sexual activity: Yes    Birth control/protection: Post-menopausal  Other Topics Concern  . 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   General: The patient is well-developed and well-nourished and in no acute distress.  Head is normocephalic and atraumatic.   Musculoskeletal : She has tenderness over the right C7 paraspinal muscle, infraspinatus, rhomboid, T7-T8 paraspinal and L3 paraspinal muscles she has tenderness in the mid to lower thoracic intracoastal region from T7--T9 on the right.  There is no tenderness on the left or in the neck or lower back.  Range of motion is normal in the spine.  Neurologic Exam  Mental status: The patient is alert and oriented x 3 at the time of the examination.  She scored 28/30 on the [place] (details above).  Focus and attention seem normal.   Speech is normal.  Cranial nerves: Extraocular muscles are intact.  Facial strength is normal.   . No obvious hearing deficits are noted.  Motor: She has normal muscle tone, muscle bulk and muscle strength in the arms or legs.  Sensation: Intact   gait and station: Gait and station are normal.     ASSESSMENT AND PLAN  1. Myofascial pain syndrome   2. Muscle spasm  3. Chronic right-sided thoracic back pain   4. Right lumbar radiculopathy      1.  Inject 100 units Botox  split into tender points below the T6-T9 ribs into the intercostal space.  There were no complications and she tolerated the injections well.    2.   She has decreased ability to squat with the right leg and also is getting radiating pain into the leg.  MRI in 2014 had shown degenerative changes at L3-L4 that could impact the L3 nerve root.  Current symptoms would be more consistent with a right L4 radiculopathy but could still be coming from the same level.  Exercise as tolerated.   3.   Medrol dose-pack.   If LBP and mild leg weakness do not improve we will check lumbar MRI for probable L4 radiculopathy. 4.   She will return in 3-4 months for next Botox or sooner if  new or worsening neurologic symptoms.   Hollyn Stucky A. Felecia Shelling, MD, PhD 7/68/0881, 1:03 PM Certified in Neurology, Clinical Neurophysiology, Sleep Medicine, Pain Medicine and Neuroimaging  [facility] [address], [place], [place] [address] [phone]

## 2021-01-04 ENCOUNTER — Other Ambulatory Visit: Payer: Self-pay

## 2021-01-04 ENCOUNTER — Ambulatory Visit (INDEPENDENT_AMBULATORY_CARE_PROVIDER_SITE_OTHER): Payer: Medicare Other | Admitting: Internal Medicine

## 2021-01-04 ENCOUNTER — Encounter (INDEPENDENT_AMBULATORY_CARE_PROVIDER_SITE_OTHER): Payer: Self-pay | Admitting: Internal Medicine

## 2021-01-04 VITALS — BP 130/70 | HR 70 | Temp 98.4°F | Resp 18

## 2021-01-04 DIAGNOSIS — R5383 Other fatigue: Secondary | ICD-10-CM

## 2021-01-04 DIAGNOSIS — R5381 Other malaise: Secondary | ICD-10-CM | POA: Diagnosis not present

## 2021-01-04 DIAGNOSIS — E2839 Other primary ovarian failure: Secondary | ICD-10-CM

## 2021-01-04 DIAGNOSIS — E785 Hyperlipidemia, unspecified: Secondary | ICD-10-CM

## 2021-01-04 LAB — LIPID PANEL
Cholesterol: 297 mg/dL — ABNORMAL HIGH (ref <200)
HDL: 62 mg/dL (ref >=50)
LDL Cholesterol (Calc): 209 mg/dL (calc) — ABNORMAL HIGH
Non-HDL Cholesterol (Calc): 235 mg/dL (calc) — ABNORMAL HIGH (ref <130)
Total CHOL/HDL Ratio: 4.8 (calc) (ref <5.0)
Triglycerides: 119 mg/dL (ref <150)

## 2021-01-04 LAB — TSH: TSH: <0.01 mIU/L — ABNORMAL LOW (ref 0.40–4.50)

## 2021-01-04 LAB — T3, FREE: T3, Free: 3.9 pg/mL (ref 2.3–4.2)

## 2021-01-04 NOTE — Progress Notes (Signed)
Metrics: Intervention Frequency ACO  Documented Smoking Status Yearly  Screened one or more times in 24 months  Cessation Counseling or  Active cessation medication Past 24 months  Past 24 months   Guideline developer: UpToDate (See UpToDate for funding source) Date Released: 2014       Wellness Office Visit  Subjective:  Patient ID: Heather [name], female    DOB: August 06, 1946  Age: 75 y.o. MRN: [id number]  CC: This lady comes in for follow-up of bioidentical hormone therapy, significant hypercholesterolemia and symptoms of thyroid hypofunction. HPI  She continues with estradiol and progesterone and takes testosterone probably every other day. Estradiol levels and progesterone levels were in a good range previously. We have been trying to optimize her thyroid which is being used off label for symptoms of thyroid hypofunction and to help with insulin resistance.  She has tolerated the higher dose and in fact she ends up taking NP thyroid 120 mg and NP thyroid 90 mg at the same time around lunchtime. She continues to do intermittent fasting. Past Medical History:  Diagnosis Date  . Adrenal adenoma   . Allergy   . Anemia   . Anxiety   . CAD (coronary artery disease)    50-60% mid LAD at cardiac catheterization 2014 [facility])  . Cataract   . Depression   . DJD (degenerative joint disease) of cervical spine 09/12/2016  . History of cardiomyopathy   . History of tobacco use   . Hyperlipidemia   . Neuromuscular disorder ([place])   . PAF (paroxysmal atrial fibrillation) [facility])    Diagnosis July 2017 - spontaneously resolved  . Statin intolerance    Past Surgical History:  Procedure Laterality Date  . ABDOMINAL HYSTERECTOMY  1980   endometriosis  . CATARACT EXTRACTION Left 11/2018  . LEFT HEART CATH AND CORONARY ANGIOGRAPHY N/A 09/25/2018   Procedure: LEFT HEART CATH AND CORONARY ANGIOGRAPHY;  Surgeon: Belva Crome, MD;  Location: [place] [facility];  Service: Cardiovascular;   Laterality: N/A;  . TONSILLECTOMY  1954     Family History  Problem Relation Age of Onset  . COPD Mother   . Hearing loss Mother   . Stroke Mother   . Heart disease Mother        chf, died at 81  . Arthritis Father   . Hypertension Father   . Heart disease Father        cad, pvd, died at 60  . Heart disease Sister        heart valve  . Arthritis Sister   . Hyperlipidemia Brother   . Hypertension Brother   . 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 Smoking status: Former Smoker    Packs/day: 0.00    Types: Cigarettes    Start date: 11/12/1965    Quit date: 09/06/2008    Years since quitting: 12.3  . Smokeless tobacco: Never Used  Substance Use Topics  . Alcohol use: Yes    Alcohol/week: 1.0 standard drink    Types: 1 Glasses of wine per week    Comment: occ    Current Meds  Medication Sig  . Biotin 10000 MCG TABS Take 10,000 mcg by mouth daily.   Marland Kitchen buPROPion (WELLBUTRIN SR) 150 MG 12 hr tablet TAKE ONE TABLET  BY MOUTH TWICE A DAY  . Cholecalciferol (VITAMIN D3) 125 MCG (5000 UT) TABS Take 15,000 Units by mouth daily.   . Coenzyme Q10 100 MG capsule Take 100 mg by mouth daily.   . Cyanocobalamin (VITAMIN B-12) 2500 MCG SUBL Place 2,500 mcg under the tongue daily.  . diazepam (VALIUM) 5 MG tablet Take 1 tablet (5 mg total) by mouth daily as needed for anxiety.  . docusate sodium (COLACE) 100 MG capsule Take 100 mg by mouth 2 (two) times daily.   Marland Kitchen estradiol (ESTRACE) 2 MG tablet Take 1 tablet by mouth daily  . lidocaine (XYLOCAINE) 5 % ointment Apply 1 application topically 3 (three) times daily as needed.  . nitroGLYCERIN (NITROSTAT) 0.4 MG SL tablet Place 1 tablet under the tongue every 5 minutes as needed for chest pain.  . NP THYROID 120 MG tablet Take 1 tablet (120 mg total) by mouth daily before breakfast.   . NP THYROID 90 MG tablet Take 1 tablet (90 mg total) by mouth daily.  Marland Kitchen oxyCODONE-acetaminophen (PERCOCET/ROXICET) 5-325 MG tablet TAKE 1 TABLET DAILY AS NEEDED FOR SEVERE PAIN.  Marland Kitchen progesterone (PROMETRIUM) 200 MG capsule Take 1 capsule (200 mg total) by mouth daily.  . Testosterone 20 % CREA Apply 1 application topically daily.   . Testosterone Propionate (FIRST-TESTOSTERONE MC) 2 % CREA Place 5 mg onto the skin daily.  Marland Kitchen warfarin (COUMADIN) 5 MG tablet Take 2 tablets by mouth daily or as directed. (Patient taking differently: Take 1.5 tablet daily except on Monday and Thursday she is to take 1 tablets daily)       Objective:   Today's Vitals: BP 130/70 (BP Location: Left Arm, Patient Position: Sitting, Cuff Size: Normal)   Pulse 70   Temp 98.4 F (36.9 C) (Temporal)   Resp 18   LMP 07/08/1979 (Approximate)   SpO2 98%  Vitals with BMI 01/04/2021 12/22/2020 10/31/2020  Height - 5\' 4"  5\' 4"   Weight (No Data) 157 lbs 158 lbs 3 oz  BMI - 09.47 09.62  Systolic 836 629 476  Diastolic 70 70 74  Pulse 70 70 62     Physical Exam   She looks systemically well.  She did not want to be weighed today.    Assessment   1. Primary ovarian failure   2. Hyperlipidemia, unspecified hyperlipidemia type   3. Malaise and fatigue       Tests ordered Orders Placed This Encounter  Procedures  . T3, free  . TSH  . Lipid panel     Plan: 1. She will continue with estradiol and progesterone as before. 2. She will continue with NP thyroid at the current dose but we will check thyroid function today. 3. We may need to adjust NP thyroid upwards further. 4. Check lipid panel today to see if there is an improvement. 5. Follow-up in about 3 months     No orders of the defined types were placed in this encounter.   Doree Albee, MD

## 2021-01-05 ENCOUNTER — Other Ambulatory Visit (INDEPENDENT_AMBULATORY_CARE_PROVIDER_SITE_OTHER): Payer: Self-pay | Admitting: Internal Medicine

## 2021-01-05 ENCOUNTER — Encounter (INDEPENDENT_AMBULATORY_CARE_PROVIDER_SITE_OTHER): Payer: Self-pay | Admitting: Internal Medicine

## 2021-01-05 MED ORDER — NP THYROID 120 MG PO TABS: 120.0000 mg | ORAL_TABLET | Freq: Two times a day (BID) | ORAL | 1 refills | Status: DC

## 2021-01-09 ENCOUNTER — Other Ambulatory Visit (INDEPENDENT_AMBULATORY_CARE_PROVIDER_SITE_OTHER): Payer: Self-pay | Admitting: Internal Medicine

## 2021-01-09 ENCOUNTER — Telehealth (INDEPENDENT_AMBULATORY_CARE_PROVIDER_SITE_OTHER): Payer: Self-pay

## 2021-01-09 ENCOUNTER — Encounter (INDEPENDENT_AMBULATORY_CARE_PROVIDER_SITE_OTHER): Payer: Self-pay | Admitting: Internal Medicine

## 2021-01-09 MED ORDER — NITROFURANTOIN MONOHYD MACRO 100 MG PO CAPS: 100.0000 mg | ORAL_CAPSULE | Freq: Two times a day (BID) | ORAL | 0 refills | Status: DC

## 2021-01-09 NOTE — Telephone Encounter (Signed)
Just to set up portal for [employer].

## 2021-01-16 ENCOUNTER — Other Ambulatory Visit (INDEPENDENT_AMBULATORY_CARE_PROVIDER_SITE_OTHER): Payer: Self-pay | Admitting: Internal Medicine

## 2021-01-16 ENCOUNTER — Encounter (INDEPENDENT_AMBULATORY_CARE_PROVIDER_SITE_OTHER): Payer: Self-pay | Admitting: Internal Medicine

## 2021-01-16 DIAGNOSIS — M25559 Pain in unspecified hip: Secondary | ICD-10-CM

## 2021-01-27 ENCOUNTER — Encounter: Payer: Self-pay | Admitting: Physical Medicine & Rehabilitation

## 2021-02-03 ENCOUNTER — Other Ambulatory Visit: Payer: Self-pay | Admitting: Neurology

## 2021-02-03 NOTE — Telephone Encounter (Signed)
Pt request refill oxyCODONE-acetaminophen (PERCOCET/ROXICET) 5-325 MG tablet at [place]

## 2021-02-06 MED ORDER — OXYCODONE-ACETAMINOPHEN 5-325 MG PO TABS: ORAL_TABLET | ORAL | 0 refills | Status: DC

## 2021-02-15 ENCOUNTER — Encounter (INDEPENDENT_AMBULATORY_CARE_PROVIDER_SITE_OTHER): Payer: Self-pay | Admitting: Internal Medicine

## 2021-02-21 ENCOUNTER — Encounter (INDEPENDENT_AMBULATORY_CARE_PROVIDER_SITE_OTHER): Payer: Self-pay | Admitting: Internal Medicine

## 2021-02-21 ENCOUNTER — Other Ambulatory Visit (INDEPENDENT_AMBULATORY_CARE_PROVIDER_SITE_OTHER): Payer: Self-pay | Admitting: Internal Medicine

## 2021-02-21 MED ORDER — PROGESTERONE 200 MG PO CAPS: 200.0000 mg | ORAL_CAPSULE | Freq: Every day | ORAL | 1 refills | Status: AC

## 2021-02-24 DIAGNOSIS — Z23 Encounter for immunization: Secondary | ICD-10-CM | POA: Diagnosis not present

## 2021-03-02 ENCOUNTER — Encounter: Payer: Medicare Other | Admitting: Physical Medicine & Rehabilitation

## 2021-03-02 ENCOUNTER — Other Ambulatory Visit: Payer: Self-pay | Admitting: Cardiology

## 2021-03-04 ENCOUNTER — Other Ambulatory Visit: Payer: Self-pay | Admitting: Cardiology

## 2021-03-10 ENCOUNTER — Other Ambulatory Visit: Payer: Self-pay

## 2021-03-13 ENCOUNTER — Other Ambulatory Visit: Payer: Self-pay | Admitting: Neurology

## 2021-03-13 MED ORDER — OXYCODONE-ACETAMINOPHEN 5-325 MG PO TABS
ORAL_TABLET | ORAL | 0 refills | Status: DC
Start: 2021-03-13 — End: 2021-04-26

## 2021-03-13 NOTE — Telephone Encounter (Signed)
Pt request refill  oxyCODONE-acetaminophen (PERCOCET/ROXICET) 5-325 MG tablet at [facility] 

## 2021-03-22 ENCOUNTER — Encounter: Payer: Self-pay | Admitting: Neurology

## 2021-03-22 ENCOUNTER — Ambulatory Visit (INDEPENDENT_AMBULATORY_CARE_PROVIDER_SITE_OTHER): Payer: Medicare Other | Admitting: Neurology

## 2021-03-22 VITALS — BP 110/65 | HR 63 | Ht 64.0 in

## 2021-03-22 DIAGNOSIS — M546 Pain in thoracic spine: Secondary | ICD-10-CM

## 2021-03-22 DIAGNOSIS — M62838 Other muscle spasm: Secondary | ICD-10-CM | POA: Diagnosis not present

## 2021-03-22 DIAGNOSIS — M7918 Myalgia, other site: Secondary | ICD-10-CM | POA: Diagnosis not present

## 2021-03-22 DIAGNOSIS — G8929 Other chronic pain: Secondary | ICD-10-CM

## 2021-03-22 NOTE — Progress Notes (Signed)
[facility]  PATIENT: Heather [name] DOB: 05/09/1946  REFERRING DOCTOR OR PCP:  Blanchie Serve SOURCE: patient  _________________________________   HISTORICAL  CHIEF COMPLAINT:  Chief Complaint  Patient presents with  . Botulinum Toxin Injection    RM 13, alone. 100Ux1 vial. Lot: [id number]. Expiration: 03/2023. NDC: 7353-2992-42    HISTORY OF PRESENT ILLNESS:  Heather [name] is a 75 y.o. woman who has had chronic right-sided mid back pain since 2013.      Update 03/22/21 She felt the myofascial pain improved after her last Botox injections about 3 months ago.Marland Kitchen   However, she did a lot of lifting in [employer] to help clean out a house and pain retured,  Pain is present in the right flank and she gets a benefit from Botox.   Percocet hels some when pain is more severe.       She has rare insomnia when pain is worse and will rarely take diazepam (usually just one or two a month).  She never combines with a percocet.    She has had some episodes of the right leg buckling at times.  These are less common now than earlier this year.  She needs the bannister now to go upstairs. She has reduced rise from squat. She also notes a pain going into the right leg but not to the ankle.   She notes it more at night.   Lumbar MRI in 2014 showed DJD at L3-L4 with spondylolisthesis and possible effects on L3 nerve roots.    She had been worried about STM but feels this is less of an issue now.   She scored 28/30 on the [facility] cognitive assessment last year .  She has recently written a book (infection novel).    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    She is on coumadin for atrial fibrillation.      MRI results from 10/14/2013: The MRI of the brain showed age related atrophy and minimal chronic microvascular ischemic change. MRI of the cervical spine showed multilevel mild degenerative changes with left paramedian disc herniation at C3-C4 and left disc protrusion at  C4-C5 and C5-C6 and midline disc herniation at C6-C7. There was no report of nerve root compression. MRI of the thoracic spine showed disc desiccation but no herniation or protrusions. MRI of the lumbar spine showed disc bulges at T12-L1 and L2-L3 and disc bulge with facet hypertrophy at L3-L4 and disc bulge with right foraminal annular tear at L4-L5.   REVIEW OF SYSTEMS: Constitutional: No fevers, chills, sweats, or change in appetite Eyes: No visual changes, double vision, eye pain Ear, nose and throat: No hearing loss, ear pain, nasal congestion, sore throat Cardiovascular: No chest pain, palpitations.   She has atrial fibrillation and is on warfarin Respiratory: No shortness of breath at rest or with exertion.   No wheezes GastrointestinaI: No nausea, vomiting, diarrhea, abdominal pain, fecal incontinence Genitourinary: No dysuria, urinary retention or frequency.  No nocturia. Musculoskeletal: No neck pain.  She has back pain/thoracic pain as above Integumentary: No rash, pruritus, skin lesions Neurological: as above Psychiatric: No depression at this time.  No anxiety Endocrine: No palpitations, diaphoresis, change in appetite, change in weigh or increased thirst Hematologic/Lymphatic: No anemia, purpura, petechiae. Allergic/Immunologic: No itchy/runny eyes, nasal congestion, recent allergic reactions, rashes  ALLERGIES: Allergies  Allergen Reactions  . Statins Other (See Comments)    Intolerant all statins  . Sulfa Antibiotics Rash    HOME MEDICATIONS:  Current Outpatient Medications:  .  Biotin 10000 MCG TABS, Take 10,000 mcg by mouth daily. , Disp: , Rfl:  .  buPROPion (WELLBUTRIN SR) 150 MG 12 hr tablet, TAKE ONE TABLET BY MOUTH TWICE A DAY, Disp: 180 tablet, Rfl: 1 .  Cholecalciferol (VITAMIN D3) 125 MCG (5000 UT) TABS, Take 15,000 Units by mouth daily. , Disp: , Rfl:  .  Coenzyme Q10 100 MG capsule, Take 100 mg by mouth daily. , Disp: , Rfl:  .  Cyanocobalamin  (VITAMIN B-12) 2500 MCG SUBL, Place 2,500 mcg under the tongue daily., Disp: , Rfl:  .  diazepam (VALIUM) 5 MG tablet, Take 1 tablet (5 mg total) by mouth daily as needed for anxiety., Disp: 30 tablet, Rfl: 0 .  docusate sodium (COLACE) 100 MG capsule, Take 100 mg by mouth 2 (two) times daily. , Disp: , Rfl:  .  estradiol (ESTRACE) 2 MG tablet, Take 1 tablet by mouth daily, Disp: 30 tablet, Rfl: 4 .  lidocaine (XYLOCAINE) 5 % ointment, Apply 1 application topically 3 (three) times daily as needed., Disp: 35.44 g, Rfl: 0 .  nitrofurantoin, macrocrystal-monohydrate, (MACROBID) 100 MG capsule, Take 1 capsule (100 mg total) by mouth 2 (two) times daily., Disp: 60 capsule, Rfl: 0 .  nitroGLYCERIN (NITROSTAT) 0.4 MG SL tablet, Place 1 tablet under the tongue every 5 minutes as needed for chest pain., Disp: 25 tablet, Rfl: 2 .  NP THYROID 120 MG tablet, Take 1 tablet (120 mg total) by mouth 2 (two) times daily., Disp: 180 tablet, Rfl: 1 .  oxyCODONE-acetaminophen (PERCOCET/ROXICET) 5-325 MG tablet, TAKE 1 TABLET DAILY AS NEEDED FOR SEVERE PAIN., Disp: 30 tablet, Rfl: 0 .  progesterone (PROMETRIUM) 200 MG capsule, Take 1 capsule (200 mg total) by mouth daily., Disp: 90 capsule, Rfl: 1 .  Testosterone 20 % CREA, Apply 1 application topically daily. , Disp: , Rfl:  .  Testosterone Propionate (FIRST-TESTOSTERONE MC) 2 % CREA, Place 5 mg onto the skin daily., Disp: 30 g, Rfl: 0 .  warfarin (COUMADIN) 5 MG tablet, Take 1 to 1 and 1/2 tablets by mouth daily or as directed. Need to call office for INR check, Disp: 135 tablet, Rfl: 0  PAST MEDICAL HISTORY: Past Medical History:  Diagnosis Date  . Adrenal adenoma   . Allergy   . Anemia   . Anxiety   . CAD (coronary artery disease)    50-60% mid LAD at cardiac catheterization 2014 [facility])  . Cataract   . Depression   . DJD (degenerative joint disease) of cervical spine 09/12/2016  . History of cardiomyopathy   . History of tobacco use   .  Hyperlipidemia   . Neuromuscular disorder ([place])   . PAF (paroxysmal atrial fibrillation) [facility]    Diagnosis July 2017 - spontaneously resolved  . Statin intolerance     PAST SURGICAL HISTORY: Past Surgical History:  Procedure Laterality Date  . ABDOMINAL HYSTERECTOMY  1980   endometriosis  . CATARACT EXTRACTION Left 11/2018  . LEFT HEART CATH AND CORONARY ANGIOGRAPHY N/A 09/25/2018   Procedure: LEFT HEART CATH AND CORONARY ANGIOGRAPHY;  Surgeon: Belva Crome, MD;  Location: [place] [facility];  Service: Cardiovascular;  Laterality: N/A;  . TONSILLECTOMY  1954    FAMILY HISTORY: Family History  Problem Relation Age of Onset  . COPD Mother   . Hearing loss Mother   . Stroke Mother   . Heart disease Mother        chf, died at 52  .  Arthritis Father   . Hypertension Father   . Heart disease Father        cad, pvd, died at 29  . Heart disease Sister        heart valve  . Arthritis Sister   . Hyperlipidemia Brother   . Hypertension Brother   . Cancer - Prostate Brother     SOCIAL HISTORY:  Social History   Socioeconomic History  . Marital status: Married    Spouse name: Al  . Number of children: 0  . Years of education: 36  . Highest education level: Not on file  Occupational History  . Occupation: Probation officer    Comment: from home  Tobacco Use  . Smoking status: Former Smoker    Packs/day: 0.00    Types: Cigarettes    Start date: 11/12/1965    Quit date: 09/06/2008    Years since quitting: 12.5  . Smokeless tobacco: Never Used  Vaping Use  . Vaping Use: Never used  Substance and Sexual Activity  . Alcohol use: Yes    Alcohol/week: 1.0 standard drink    Types: 1 Glasses of wine per week    Comment: occ  . Drug use: Yes    Types: Marijuana    Comment: rarely  . Sexual activity: Yes    Birth control/protection: Post-menopausal  Other Topics Concern  . 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   General: The patient is well-developed and well-nourished and in no acute distress.  Head is normocephalic and atraumatic.   Musculoskeletal : She has tenderness over the right C7 paraspinal muscle, infraspinatus, rhomboid, T7-T8 paraspinal and L3 paraspinal muscles she has tenderness in the mid to lower thoracic intracoastal region from T7--T9 on the right.  There is no tenderness on the left or in the neck or lower back.  Range of motion is normal in the spine.  Neurologic Exam  Mental status: The patient is alert and oriented x 3 at the time of the examination.  She scored 28/30 on the [place] (details above).  Focus and attention seem normal.   Speech is normal.  Cranial nerves: Extraocular muscles are intact.  Facial strength is normal.   . No obvious hearing deficits are noted.  Motor: She has normal muscle tone, muscle bulk and muscle strength in the arms or legs.  Sensation: Intact   gait and station: Gait and station are normal.     ASSESSMENT AND PLAN  1. Myofascial pain syndrome   2. Muscle spasm   3. Chronic right-sided thoracic back pain      1.  Inject 100 units Botox split into tender points below the T6-T9 ribs into the intercostal muscles near the midclavicular line.  There were no complications and she tolerated the injections well.    2.   Stay active and exercise as tolerated. 3.   She  will return in 3-4 months for next Botox or sooner if  new or worsening neurologic symptoms.   Cady Hafen A. Felecia Shelling, MD, PhD [id number], [id number]  PM Certified in Neurology, Clinical Neurophysiology, Sleep Medicine, Pain Medicine and Neuroimaging  [facility] [address], [place], [place] [address] [phone]

## 2021-03-27 ENCOUNTER — Other Ambulatory Visit (INDEPENDENT_AMBULATORY_CARE_PROVIDER_SITE_OTHER): Payer: Self-pay | Admitting: Internal Medicine

## 2021-03-27 NOTE — Telephone Encounter (Signed)
Ok

## 2021-04-03 ENCOUNTER — Other Ambulatory Visit (HOSPITAL_COMMUNITY): Payer: Self-pay | Admitting: Internal Medicine

## 2021-04-03 DIAGNOSIS — Z1231 Encounter for screening mammogram for malignant neoplasm of breast: Secondary | ICD-10-CM

## 2021-04-05 ENCOUNTER — Encounter (INDEPENDENT_AMBULATORY_CARE_PROVIDER_SITE_OTHER): Payer: Self-pay | Admitting: Internal Medicine

## 2021-04-05 ENCOUNTER — Other Ambulatory Visit: Payer: Self-pay

## 2021-04-05 ENCOUNTER — Ambulatory Visit (INDEPENDENT_AMBULATORY_CARE_PROVIDER_SITE_OTHER): Payer: Medicare Other | Admitting: Internal Medicine

## 2021-04-05 VITALS — BP 130/60 | HR 60 | Ht 64.0 in

## 2021-04-05 DIAGNOSIS — E785 Hyperlipidemia, unspecified: Secondary | ICD-10-CM | POA: Diagnosis not present

## 2021-04-05 DIAGNOSIS — E2839 Other primary ovarian failure: Secondary | ICD-10-CM | POA: Diagnosis not present

## 2021-04-05 MED ORDER — NP THYROID 90 MG PO TABS: 90.0000 mg | ORAL_TABLET | Freq: Every day | ORAL | 1 refills | Status: DC

## 2021-04-05 NOTE — Progress Notes (Signed)
Metrics: Intervention Frequency ACO  Documented Smoking Status Yearly  Screened one or more times in 24 months  Cessation Counseling or  Active cessation medication Past 24 months  Past 24 months   Guideline developer: UpToDate (See UpToDate for funding source) Date Released: 2014       Wellness Office Visit  Subjective:  Patient ID: Heather [name], female    DOB: Dec 16, 1945  Age: 75 y.o. MRN: [id number]  CC: Follow up HPI  Doing well,but feels fatigued and has been gaining weight. Past Medical History:  Diagnosis Date  . Adrenal adenoma   . Allergy   . Anemia   . Anxiety   . CAD (coronary artery disease)    50-60% mid LAD at cardiac catheterization 2014 [facility])  . Cataract   . Depression   . DJD (degenerative joint disease) of cervical spine 09/12/2016  . History of cardiomyopathy   . History of tobacco use   . Hyperlipidemia   . Neuromuscular disorder ([place])   . PAF (paroxysmal atrial fibrillation) [facility])    Diagnosis July 2017 - spontaneously resolved  . Statin intolerance    Past Surgical History:  Procedure Laterality Date  . ABDOMINAL HYSTERECTOMY  1980   endometriosis  . CATARACT EXTRACTION Left 11/2018  . LEFT HEART CATH AND CORONARY ANGIOGRAPHY N/A 09/25/2018   Procedure: LEFT HEART CATH AND CORONARY ANGIOGRAPHY;  Surgeon: Belva Crome, MD;  Location: [place] [facility];  Service: Cardiovascular;  Laterality: N/A;  . TONSILLECTOMY  1954     Family History  Problem Relation Age of Onset  . COPD Mother   . Hearing loss Mother   . Stroke Mother   . Heart disease Mother        chf, died at 72  . Arthritis Father   . Hypertension Father   . Heart disease Father        cad, pvd, died at 51  . Heart disease Sister        heart valve  . Arthritis Sister   . Hyperlipidemia Brother   . Hypertension Brother   . 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 Smoking status: Former Smoker    Packs/day: 0.00    Types: Cigarettes    Start date: 11/12/1965    Quit date: 09/06/2008    Years since quitting: 12.5  . Smokeless tobacco: Never Used  Substance Use Topics  . Alcohol use: Yes    Alcohol/week: 1.0 standard drink    Types: 1 Glasses of wine per week    Comment: occ    Current Meds  Medication Sig  . Biotin 10000 MCG TABS Take 10,000 mcg by mouth daily.   Marland Kitchen buPROPion (WELLBUTRIN SR) 150 MG 12 hr tablet TAKE ONE TABLET BY MOUTH TWICE A DAY  . Cholecalciferol (VITAMIN D3) 125 MCG (5000 UT) TABS Take 15,000 Units by mouth daily.   . Coenzyme Q10 100 MG capsule Take 100 mg by mouth daily.   . Cyanocobalamin (VITAMIN B-12) 2500 MCG SUBL Place 2,500 mcg under the tongue daily.  . diazepam (VALIUM) 5 MG tablet Take 1 tablet (5 mg total) by mouth daily as needed for anxiety.  . docusate sodium (COLACE) 100 MG capsule Take 100 mg by mouth 2 (two) times daily.   Marland Kitchen  estradiol (ESTRACE) 2 MG tablet Take 1 tablet (2 mg total) by mouth daily.  Marland Kitchen lidocaine (XYLOCAINE) 5 % ointment Apply 1 application topically 3 (three) times daily as needed.  . nitrofurantoin, macrocrystal-monohydrate, (MACROBID) 100 MG capsule Take 1 capsule (100 mg total) by mouth 2 (two) times daily.  . nitroGLYCERIN (NITROSTAT) 0.4 MG SL tablet Place 1 tablet under the tongue every 5 minutes as needed for chest pain.  . NP THYROID 120 MG tablet Take 1 tablet (120 mg total) by mouth 2 (two) times daily. (Patient taking differently: Take 120 mg by mouth daily before breakfast.)  . oxyCODONE-acetaminophen (PERCOCET/ROXICET) 5-325 MG tablet TAKE 1 TABLET DAILY AS NEEDED FOR SEVERE PAIN.  Marland Kitchen progesterone (PROMETRIUM) 200 MG capsule Take 1 capsule (200 mg total) by mouth daily.  . Testosterone Propionate (FIRST-TESTOSTERONE MC) 2 % CREA Place 5 mg onto the skin daily.  Marland Kitchen warfarin (COUMADIN) 5 MG tablet Take 1 to 1 and 1/2  tablets by mouth daily or as directed. Need to call office for INR check  . [DISCONTINUED] NP THYROID 90 MG tablet Take 90 mg by mouth daily before lunch.  . [DISCONTINUED] Testosterone 20 % CREA Apply 1 application topically daily.        Objective:   Today's Vitals: BP 130/60   Pulse 60   Ht 5\' 4"  (1.626 m)   LMP 07/08/1979 (Approximate)   SpO2 98%   BMI 26.95 kg/m  Vitals with BMI 04/05/2021 03/22/2021 01/04/2021  Height 5\' 4"  5\' 4"  (No Data)  Weight (No Data) - (No Data)  BMI - - -  Systolic [phone]  Diastolic 60 65 70  Pulse 60 63 70     Physical Exam   Did not want to be weighed today    Assessment   1. Hyperlipidemia, unspecified hyperlipidemia type   2. Primary ovarian failure       Tests ordered Orders Placed This Encounter  Procedures  . Estradiol  . Progesterone  . Lipid panel  . DHEA-sulfate     Plan: 1. Tolerates NP THYROID 120mg  and NP THYROID 90mg  2. Continue with Estradiol and Progesterone.Takes Testosterone intermittently. 3. Blood work ordered. 4. FU 3 months.   Meds ordered this encounter  Medications  . NP THYROID 90 MG tablet    Sig: Take 1 tablet (90 mg total) by mouth daily before lunch.    Dispense:  30 tablet    Refill:  1    Sarin [place]unale Luther Parody, MD

## 2021-04-06 ENCOUNTER — Other Ambulatory Visit (INDEPENDENT_AMBULATORY_CARE_PROVIDER_SITE_OTHER): Payer: Self-pay | Admitting: Internal Medicine

## 2021-04-06 LAB — LIPID PANEL
Cholesterol: 255 mg/dL — ABNORMAL HIGH (ref <200)
HDL: 56 mg/dL (ref >=50)
LDL Cholesterol (Calc): 169 mg/dL (calc) — ABNORMAL HIGH
Non-HDL Cholesterol (Calc): 199 mg/dL (calc) — ABNORMAL HIGH (ref <130)
Total CHOL/HDL Ratio: 4.6 (calc) (ref <5.0)
Triglycerides: 152 mg/dL — ABNORMAL HIGH (ref <150)

## 2021-04-06 LAB — DHEA-SULFATE: DHEA-SO4: 43 ug/dL (ref 4–157)

## 2021-04-06 LAB — ESTRADIOL: Estradiol: 70 pg/mL

## 2021-04-06 LAB — PROGESTERONE: Progesterone: 7.4 ng/mL

## 2021-04-06 MED ORDER — PROGESTERONE MICRONIZED 100 MG PO CAPS: 100.0000 mg | ORAL_CAPSULE | Freq: Every evening | ORAL | 1 refills | Status: DC

## 2021-04-06 MED ORDER — ESTRADIOL 0.5 MG PO TABS: 0.5000 mg | ORAL_TABLET | Freq: Every day | ORAL | 1 refills | Status: DC

## 2021-04-07 ENCOUNTER — Encounter (INDEPENDENT_AMBULATORY_CARE_PROVIDER_SITE_OTHER): Payer: Self-pay | Admitting: Internal Medicine

## 2021-04-11 ENCOUNTER — Other Ambulatory Visit (INDEPENDENT_AMBULATORY_CARE_PROVIDER_SITE_OTHER): Payer: Self-pay | Admitting: Internal Medicine

## 2021-04-11 MED ORDER — NP THYROID 120 MG PO TABS: 120.0000 mg | ORAL_TABLET | Freq: Every day | ORAL | 1 refills | Status: DC

## 2021-04-11 NOTE — Progress Notes (Addendum)
Cardiology Office Note    Date:  04/24/2021   ID:  Heather [name], DOB 1946/08/16, MRN [id number]   PCP:  Heather Albee, MD   [place]  Cardiologist:  Heather Lesches, MD  Advanced Practice Provider:  No care team member to display Electrophysiologist:  None   [phone]   Chief Complaint  Patient presents with   Shortness of Breath   arm numbness     History of Present Illness:  Heather [name] is a 75 y.o. female with history of nonobstructive CAD on cath 2019, 25% ostial LAD, 30 to 40% LAD 30% circumflex and 40 to 50% RCA, hyperlipidemia with statin intolerance previously on Repatha but stopped and focusing on diet and hormone supplementation, PAF 05/2016 on Coumadin, history of Takotsubo cardiomyopathy.  Last saw Dr. Domenic Polite 04/20/21 and doing well.  Patient comes in for yearly f/u. LDL 169 down from 209 in Feb.  Patient comes in because she has awakened about 4-6 times feels numbness and tension in left arm, face, leg and shoulder. Thought it was panic attacks and eased with Valium. She tried NTG without improvement of symptoms. She thinks her blockages may have worsened. Also exhausted all the time. Unable to exercise. 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 Her brother in law died suddenly and she's had to go to [place] to help her grieving sister. Having to nap frequently. Having headaches. Says her handwriting has changed. Wondering if she has Parkinson's symptoms. Has had some Afib but that doesn't seem to bother her. No chest tightness when doing her house work. Just exhausted and can't exercise.     Past Medical History:  Diagnosis Date   Adrenal adenoma    Allergy    Anemia    Anxiety    CAD (coronary artery disease)    50-60% mid LAD at cardiac catheterization 2014 ([place])   Cataract    Depression    DJD (degenerative joint disease) of cervical spine 09/12/2016   History of cardiomyopathy     History of tobacco use    Hyperlipidemia    Neuromuscular disorder (HCC)    PAF (paroxysmal atrial fibrillation) ([place])    Diagnosis July 2017 - spontaneously resolved   Statin intolerance     Past Surgical History:  Procedure Laterality Date   ABDOMINAL HYSTERECTOMY  1980   endometriosis   CATARACT EXTRACTION Left 11/2018   LEFT HEART CATH AND CORONARY ANGIOGRAPHY N/A 09/25/2018   Procedure: LEFT HEART CATH AND CORONARY ANGIOGRAPHY;  Surgeon: Heather Crome, MD;  Location: [facility];  Service: Cardiovascular;  Laterality: N/A;   TONSILLECTOMY  1954    Current Medications: Current Meds  Medication Sig   Biotin 10000 MCG TABS Take 10,000 mcg by mouth daily.    buPROPion (WELLBUTRIN SR) 150 MG 12 hr tablet TAKE ONE TABLET BY MOUTH TWICE A DAY   Cholecalciferol (VITAMIN D3) 125 MCG (5000 UT) TABS Take 15,000 Units by mouth daily.    Coenzyme Q10 100 MG capsule Take 100 mg by mouth daily.    diazepam (VALIUM) 5 MG tablet Take 1 tablet (5 mg total) by mouth daily as needed for anxiety.   docusate sodium (COLACE) 100 MG capsule Take 100 mg by mouth 2 (two) times daily.    estradiol (ESTRACE) 0.5 MG tablet Take 1 tablet (0.5 mg total) by mouth daily.   estradiol (ESTRACE) 2 MG tablet Take 1 tablet (2 mg total) by  mouth daily.   lidocaine (XYLOCAINE) 5 % ointment Apply 1 application topically 3 (three) times daily as needed.   nitrofurantoin, macrocrystal-monohydrate, (MACROBID) 100 MG capsule Take 1 capsule (100 mg total) by mouth 2 (two) times daily.   nitroGLYCERIN (NITROSTAT) 0.4 MG SL tablet Place 1 tablet under the tongue every 5 minutes as needed for chest pain.   NP THYROID 120 MG tablet Take 1 tablet (120 mg total) by mouth daily before breakfast.   NP THYROID 90 MG tablet Take 1 tablet (90 mg total) by mouth daily before lunch.   oxyCODONE-acetaminophen (PERCOCET/ROXICET) 5-325 MG tablet TAKE 1 TABLET DAILY AS NEEDED FOR SEVERE PAIN.   progesterone (PROMETRIUM) 200 MG  capsule Take 1 capsule (200 mg total) by mouth daily.   Testosterone Propionate (FIRST-TESTOSTERONE MC) 2 % CREA Place 5 mg onto the skin daily.   warfarin (COUMADIN) 5 MG tablet Take 1 to 1 and 1/2 tablets by mouth daily or as directed. Need to call office for INR check     Allergies:   Statins and Sulfa antibiotics   Social History   Socioeconomic History   Marital status: Married    Spouse name: Al   Number of children: 0   Years of education: 18   Highest education level: Not on file  Occupational History   Occupation: Probation officer    Comment: from home  Tobacco Use   Smoking status: Former    Packs/day: 0.00    Pack years: 0.00    Types: Cigarettes    Start date: 11/12/1965    Quit date: 09/06/2008    Years since quitting: 12.6   Smokeless tobacco: Never  Vaping Use   Vaping Use: Never used  Substance and Sexual Activity   Alcohol use: Yes    Alcohol/week: 1.0 standard drink    Types: 1 Glasses of wine per week    Comment: occ   Drug use: Yes    Types: Marijuana    Comment: rarely   Sexual activity: Yes    Birth control/protection: Post-menopausal  Other Topics Concern   Not on file  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   Lives with husband AL   No children   Healthy diet and lifestyle   Social Determinants of Health   Financial Resource Strain: Not on file  Food Insecurity: Not on file  Transportation Needs: Not on file  Physical Activity: Not on file  Stress: Not on file  Social Connections: Not on file     Family History:  The patient's family history includes Arthritis in her father and sister; COPD in her mother; Cancer - Prostate in her brother; Hearing loss in her mother; Heart disease in her father, mother, and sister; Hyperlipidemia in her brother; Hypertension in her brother and father; Stroke in her mother.   ROS:   Please see the history of present illness.    ROS All other systems reviewed and are negative.   PHYSICAL EXAM:   VS:  BP  120/66   Pulse (!) 57   Ht 5\' 4"  (1.626 m)   Wt 161 lb (73 kg)   LMP 07/08/1979 (Approximate)   SpO2 98%   BMI 27.64 kg/m   Physical Exam  GEN: Well nourished, well developed, in no acute distress  Neck: no JVD, carotid bruits, or masses Cardiac:RRR; 1/6 systolic murmur LSB Respiratory:  clear to auscultation bilaterally, normal work of breathing GI: soft, nontender, nondistended, + BS Ext: without cyanosis, clubbing, or edema, Good  distal pulses bilaterally Neuro:  Alert and Oriented x 3 Psych: euthymic mood, full affect  Wt Readings from Last 3 Encounters:  04/24/21 161 lb (73 kg)  12/22/20 157 lb (71.2 kg)  10/31/20 158 lb 3.2 oz (71.8 kg)      Studies/Labs Reviewed:   EKG:  EKG is ordered today.  The ekg ordered today demonstrates NSR poor anterior R wave progression.unchanged from prior tracings.  Recent Labs: 06/05/2020: ALT 20; BUN 22; Creatinine, Ser 0.61; Hemoglobin 13.0; Platelets 215; Potassium 4.1; Sodium 133 01/04/2021: TSH <0.01   Lipid Panel    Component Value Date/Time   CHOL 255 (H) 04/05/2021 1414   TRIG 152 (H) 04/05/2021 1414   HDL 56 04/05/2021 1414   CHOLHDL 4.6 04/05/2021 1414   LDLCALC 169 (H) 04/05/2021 1414    Additional studies/ records that were reviewed today include:  Cardiac catheterization 09/25/2018: Right dominant coronary anatomy Short left main, widely patent Ostial 25% LAD followed by a segmental 30 to 40% region of narrowing.  One large diagonal branch arises from this region and is as large as the left anterior descending which has no significant focal obstruction. Circumflex has proximal irregularity up to 30%.  2 large obtuse marginal branches are free of obstruction. The right coronary artery contains a relatively short segmental 40 to 50% narrowing. Left ventricular function is normal with EF greater than 55%.  LVEDP is normal.   RECOMMENDATIONS:   Aggressive risk prevention: LDL less than 70, blood pressure 130/80 mmHg or  less, hemoglobin A1c less than 7, 150 minutes or more of moderate physical activity per week, consider screening for sleep apnea, and smoking cessation if appropriate. Consider aspirin although will be at increased bleeding risk when used with Coumadin therapy.      Risk Assessment/Calculations:    CHA2DS2-VASc Score = 4  This indicates a 4.8% annual risk of stroke. The patient's score is based upon: CHF History: No HTN History: No Diabetes History: No Stroke History: No Vascular Disease History: Yes Age Score: 2 Gender Score: 1       ASSESSMENT:    1. PAF (paroxysmal atrial fibrillation) ([place])   2. Coronary artery disease involving native coronary artery of native heart with angina pectoris ([place])   3. Mixed hyperlipidemia   4. Left arm numbness   5. DOE (dyspnea on exertion)      PLAN:  In order of problems listed above:  Left arm numbness left facial and leg numbness awaking her at night feels like it may be a panic attack and eases with Valium, no change with nitroglycerin.  No chest pain.  She does have complete fatigue and says she is unable to exercise because she gets out of breath.  Can complete her housework without difficulty.  Has a lot of stress in her life currently as discussed above.  Worried her blockages have progressed.  Would like to proceed with exercise Myoview and echo.  EKG today unchanged.  She does have a neurologist in [place] and I asked her to follow-up with him as well.  Addendum 05/24/21: patient with abnormal NST-reviewed by Dr. Domenic Polite and given history of CAD and symptoms cardiac cath recommended. I called patient and discussed risks and benefits and she's agreeable. She will need to come in for an H&P since it's over a month since LOV. She will also need a lovenox bridge. She will see Edrick Oh to arrange this and discuss transitioning to Eliquis since she hasn't been checking her INR's. The  earliest the cath can be done is 05/31/21 I have  reviewed the risks, indications, and alternatives to angioplasty and stenting with the patient. Risks include but are not limited to bleeding, infection, vascular injury, stroke, myocardial infection, arrhythmia, kidney injury, radiation-related injury in the case of prolonged fluoroscopy use, emergency cardiac surgery, and death. The patient understands the risks of serious complication is low (<4%) and patient agrees to proceed.    PAF on Coumadin-occasional palpitations but not causing current symptoms  Mild to moderate nonobstructive CAD on cath in 2019 not on aspirin because of Coumadin.  Severe hyperlipidemia with statin intolerance and stopped Repatha.  Being treated by Dr. Anastasio Champion with hormone supplementation and diet.  LDL 169 total cholesterol 255 HDL 56 triglycerides 152 04/05/2021 LDL has come down from 209 in Feb. TSH was low in February.  Patient will discuss further with Dr. Anastasio Champion  Shared Decision Making/Informed Consent   Shared Decision Making/Informed Consent The risks [chest pain, shortness of breath, cardiac arrhythmias, dizziness, blood pressure fluctuations, myocardial infarction, stroke/transient ischemic attack, nausea, vomiting, allergic reaction, radiation exposure, metallic taste sensation and life-threatening complications (estimated to be 1 in 10,000)], benefits (risk stratification, diagnosing coronary artery disease, treatment guidance) and alternatives of a nuclear stress test were discussed in detail with Ms. Vanderzanden and she agrees to proceed.    Medication Adjustments/Labs and Tests Ordered: Current medicines are reviewed at length with the patient today.  Concerns regarding medicines are outlined above.  Medication changes, Labs and Tests ordered today are listed in the Patient Instructions below. There are no Patient Instructions on file for this visit.   Sumner Boast, PA-C  04/24/2021 2:55 PM    [facility] [place],  [place], [place]  [address] Phone: [phone]; Fax: [phone]

## 2021-04-17 ENCOUNTER — Other Ambulatory Visit (INDEPENDENT_AMBULATORY_CARE_PROVIDER_SITE_OTHER): Payer: Self-pay | Admitting: Internal Medicine

## 2021-04-17 ENCOUNTER — Encounter (HOSPITAL_COMMUNITY): Payer: 59

## 2021-04-17 DIAGNOSIS — Z1231 Encounter for screening mammogram for malignant neoplasm of breast: Secondary | ICD-10-CM

## 2021-04-17 MED ORDER — NP THYROID 120 MG PO TABS: 120.0000 mg | ORAL_TABLET | Freq: Every day | ORAL | 1 refills | Status: DC

## 2021-04-17 MED ORDER — NP THYROID 90 MG PO TABS: 90.0000 mg | ORAL_TABLET | Freq: Every day | ORAL | 1 refills | Status: DC

## 2021-04-19 ENCOUNTER — Encounter (INDEPENDENT_AMBULATORY_CARE_PROVIDER_SITE_OTHER): Payer: Self-pay | Admitting: Internal Medicine

## 2021-04-20 ENCOUNTER — Other Ambulatory Visit: Payer: Self-pay

## 2021-04-24 ENCOUNTER — Other Ambulatory Visit: Payer: Self-pay

## 2021-04-24 ENCOUNTER — Encounter: Payer: Self-pay | Admitting: Physician Assistant

## 2021-04-24 ENCOUNTER — Ambulatory Visit (INDEPENDENT_AMBULATORY_CARE_PROVIDER_SITE_OTHER): Payer: Medicare Other | Admitting: Physician Assistant

## 2021-04-24 VITALS — BP 120/66 | HR 57 | Ht 64.0 in | Wt 161.0 lb

## 2021-04-24 DIAGNOSIS — I25119 Atherosclerotic heart disease of native coronary artery with unspecified angina pectoris: Secondary | ICD-10-CM | POA: Diagnosis not present

## 2021-04-24 DIAGNOSIS — E782 Mixed hyperlipidemia: Secondary | ICD-10-CM

## 2021-04-24 DIAGNOSIS — I480 Paroxysmal atrial fibrillation: Secondary | ICD-10-CM

## 2021-04-24 DIAGNOSIS — R0609 Other forms of dyspnea: Secondary | ICD-10-CM

## 2021-04-24 DIAGNOSIS — R0600 Dyspnea, unspecified: Secondary | ICD-10-CM | POA: Diagnosis not present

## 2021-04-24 DIAGNOSIS — R200 Anesthesia of skin: Secondary | ICD-10-CM | POA: Diagnosis not present

## 2021-04-24 NOTE — Patient Instructions (Signed)
Medication Instructions:  Your physician recommends that you continue on your current medications as directed. Please refer to the Current Medication list given to you today.  *If you need a refill on your cardiac medications before your next appointment, please call your pharmacy*   Lab Work: None today  If you have labs (blood work) drawn today and your tests are completely normal, you will receive your results only by: [place] (if you have MyChart) OR A paper copy in the mail If you have any lab test that is abnormal or we need to change your treatment, we will call you to review the results.   Testing/Procedures: Your physician has requested that you have an echocardiogram. Echocardiography is a painless test that uses sound waves to create images of your heart. It provides your doctor with information about the size and shape of your heart and how well your heart's chambers and valves are working. This procedure takes approximately one hour. There are no restrictions for this procedure.   Your physician has requested that you have en exercise stress myoview. For further information please visit [email or web address]. Please follow instruction sheet, as given.    Follow-Up: At [facility], you and your health needs are our priority.  As part of our continuing mission to provide you with exceptional heart care, we have created designated Provider Care Teams.  These Care Teams include your primary Cardiologist (physician) and Advanced Practice Providers (APPs -  Physician Assistants and Nurse Practitioners) who all work together to provide you with the care you need, when you need it.  We recommend signing up for the patient portal called "MyChart".  Sign up information is provided on this After Visit Summary.  MyChart is used to connect with patients for Virtual Visits (Telemedicine).  Patients are able to view lab/test results, encounter notes, upcoming appointments, etc.   Non-urgent messages can be sent to your provider as well.   To learn more about what you can do with MyChart, go to [email or web address].    Your next appointment:  Next available with Dr.McDowell

## 2021-04-25 ENCOUNTER — Telehealth (INDEPENDENT_AMBULATORY_CARE_PROVIDER_SITE_OTHER): Payer: Self-pay

## 2021-04-25 NOTE — Telephone Encounter (Signed)
[facility] called and left a detailed voice message about new Rx of Estradiol.  I called them back at [phone] and spoke to [facility] and gave him the verbal that the Estradiol 0.5 mg is in addition with the Estradiol 2 mg. Aileen Pilot verbalized an understanding and will push this through for the patient.

## 2021-04-26 ENCOUNTER — Other Ambulatory Visit: Payer: Self-pay | Admitting: Neurology

## 2021-04-26 ENCOUNTER — Telehealth: Payer: Self-pay | Admitting: Neurology

## 2021-04-26 MED ORDER — OXYCODONE-ACETAMINOPHEN 5-325 MG PO TABS: ORAL_TABLET | ORAL | 0 refills | Status: DC

## 2021-04-26 NOTE — Telephone Encounter (Signed)
Pt request refill  oxyCODONE-acetaminophen (PERCOCET/ROXICET) 5-325 MG tablet at [employer] [facility]

## 2021-04-26 NOTE — Addendum Note (Signed)
Addended by: Wyvonnia Lora on: 04/26/2021 03:46 PM   Modules accepted: Orders

## 2021-04-26 NOTE — Telephone Encounter (Signed)
Pt is needing a refill on her oxyCODONE-acetaminophen (PERCOCET/ROXICET) 5-325 MG tablet sent to the [facility]

## 2021-04-26 NOTE — Telephone Encounter (Signed)
Rx sent to [facility] earlier in error. Cx rx there and sent to Dr. Krista Blue to resend to [facility] for pt.

## 2021-04-27 ENCOUNTER — Other Ambulatory Visit (INDEPENDENT_AMBULATORY_CARE_PROVIDER_SITE_OTHER): Payer: Self-pay | Admitting: Internal Medicine

## 2021-04-27 ENCOUNTER — Other Ambulatory Visit: Payer: Self-pay

## 2021-04-27 ENCOUNTER — Ambulatory Visit (INDEPENDENT_AMBULATORY_CARE_PROVIDER_SITE_OTHER): Payer: Medicare Other | Admitting: Internal Medicine

## 2021-04-27 ENCOUNTER — Encounter (INDEPENDENT_AMBULATORY_CARE_PROVIDER_SITE_OTHER): Payer: Self-pay | Admitting: Internal Medicine

## 2021-04-27 VITALS — BP 120/67 | Temp 97.8°F | Resp 17 | Ht 64.0 in | Wt 161.0 lb

## 2021-04-27 DIAGNOSIS — I25119 Atherosclerotic heart disease of native coronary artery with unspecified angina pectoris: Secondary | ICD-10-CM

## 2021-04-27 DIAGNOSIS — R531 Weakness: Secondary | ICD-10-CM | POA: Diagnosis not present

## 2021-04-27 MED ORDER — DIAZEPAM 5 MG PO TABS: 5.0000 mg | ORAL_TABLET | Freq: Every day | ORAL | 0 refills | Status: DC | PRN

## 2021-04-27 NOTE — Progress Notes (Signed)
Metrics: Intervention Frequency ACO  Documented Smoking Status Yearly  Screened one or more times in 24 months  Cessation Counseling or  Active cessation medication Past 24 months  Past 24 months   Guideline developer: UpToDate (See UpToDate for funding source) Date Released: 2014       Wellness Office Visit  Subjective:  Patient ID: Heather [name], female    DOB: Feb 21, 1946  Age: 75 y.o. MRN: [id number]  CC: Left-sided weakness HPI  This lady comes in for an acute visit because she feels that she is somewhat clumsy especially the left side of her body and tends to hold onto the wall as she thinks she is going to fall.  She is also having some other symptoms of numbness in the left side of her body. She is also seeing cardiology because she is getting some neck pain/shortness of breath. Her partner thinks this may be all anxiety related. She also feels that her handwriting is changed and wonders whether she has Parkinson's disease. Past Medical History:  Diagnosis Date   Adrenal adenoma    Allergy    Anemia    Anxiety    CAD (coronary artery disease)    50-60% mid LAD at cardiac catheterization 2014 ([place])   Cataract    Depression    DJD (degenerative joint disease) of cervical spine 09/12/2016   History of cardiomyopathy    History of tobacco use    Hyperlipidemia    Neuromuscular disorder (HCC)    PAF (paroxysmal atrial fibrillation) ([place])    Diagnosis July 2017 - spontaneously resolved   Statin intolerance    Past Surgical History:  Procedure Laterality Date   ABDOMINAL HYSTERECTOMY  1980   endometriosis   CATARACT EXTRACTION Left 11/2018   LEFT HEART CATH AND CORONARY ANGIOGRAPHY N/A 09/25/2018   Procedure: LEFT HEART CATH AND CORONARY ANGIOGRAPHY;  Surgeon: Belva Crome, MD;  Location: [facility];  Service: Cardiovascular;  Laterality: N/A;   TONSILLECTOMY  1954     Family History  Problem Relation Age of Onset   COPD Mother    Hearing loss  Mother    Stroke Mother    Heart disease Mother        chf, died at 4   Arthritis Father    Hypertension Father    Heart disease Father        cad, pvd, died at 52   Heart disease Sister        heart valve   Arthritis Sister    Hyperlipidemia Brother    Hypertension Brother    Cancer - Prostate Brother     Social History   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    buPROPion (WELLBUTRIN SR) 150 MG 12 hr tablet TAKE ONE TABLET BY MOUTH  TWICE A DAY   Cholecalciferol (VITAMIN D3) 125 MCG (5000 UT) TABS Take 15,000 Units by mouth daily.    Coenzyme Q10 100 MG capsule Take 100 mg by mouth daily.    diazepam (VALIUM) 5 MG tablet Take 1 tablet (5 mg total) by mouth daily as needed for anxiety.   docusate sodium (COLACE) 100 MG capsule Take 100 mg by mouth 2 (two) times daily.    estradiol (ESTRACE) 0.5 MG tablet Take 1 tablet (0.5 mg total) by mouth daily.   estradiol (ESTRACE) 2 MG tablet Take 1 tablet (2 mg total) by mouth daily.   lidocaine (XYLOCAINE) 5 % ointment Apply 1 application topically 3 (three) times daily as needed.   nitrofurantoin, macrocrystal-monohydrate, (MACROBID) 100 MG capsule Take 1 capsule (100 mg total) by mouth 2 (two) times daily.   nitroGLYCERIN (NITROSTAT) 0.4 MG SL tablet Place 1 tablet under the tongue every 5 minutes as needed for chest pain.   NP THYROID 120 MG tablet Take 1 tablet (120 mg total) by mouth daily before breakfast.   NP  THYROID 90 MG tablet Take 1 tablet (90 mg total) by mouth daily before lunch.   oxyCODONE-acetaminophen (PERCOCET/ROXICET) 5-325 MG tablet TAKE 1 TABLET DAILY AS NEEDED FOR SEVERE PAIN.   progesterone (PROMETRIUM) 200 MG capsule Take 1 capsule (200 mg total) by mouth daily.   Testosterone Propionate (FIRST-TESTOSTERONE MC) 2 % CREA Place 5 mg onto the skin daily.   warfarin (COUMADIN) 5 MG tablet Take 1 to 1 and 1/2 tablets by mouth daily or as directed. Need to call office for INR check       Objective:   Today's Vitals: BP 120/67 (BP Location: Left Arm, Patient Position: Sitting, Cuff Size: Normal)   Temp 97.8 F (36.6 C) (Temporal)   Resp 17   Ht 5\' 4"  (1.626 m)   Wt 161 lb (73 kg)   LMP 07/08/1979 (Approximate)   SpO2 99%   BMI 27.64 kg/m  Vitals with BMI 04/27/2021 04/24/2021 04/05/2021  Height 5\' 4"  5\' 4"  5\' 4"   Weight 161 lbs 161 lbs (No Data)  BMI 50.27 74.12 -  Systolic 878 676 720  Diastolic 67 66 60  Pulse - 57 60     Physical Exam  She is alert and orientated.  There are no focal neurological signs but perhaps slight weakness on the left side but not convincing.  There were no extrapyramidal  signs and I am convinced that she does not have Parkinson's disease.     Assessment   1. Left-sided weakness       Tests ordered Orders Placed This Encounter  Procedures   MR BRAIN WO CONTRAST   Ambulatory referral to Neurology     Plan: 1.  I am somewhat concerned about the left-sided weakness and we will arrange an MRI brain scan to further evaluate.  I have told her that I am convinced she does not have Parkinson's disease.  She will also follow with neurology and we will make a referral. 2.  Follow-up as previously scheduled at the end of August.   No orders of the defined types were placed in this encounter.   Doree Albee, MD

## 2021-04-28 ENCOUNTER — Encounter (INDEPENDENT_AMBULATORY_CARE_PROVIDER_SITE_OTHER): Payer: Self-pay

## 2021-05-01 ENCOUNTER — Other Ambulatory Visit: Payer: Self-pay

## 2021-05-01 ENCOUNTER — Ambulatory Visit (HOSPITAL_COMMUNITY)
Admission: RE | Admit: 2021-05-01 | Discharge: 2021-05-01 | Disposition: A | Discharge status: Home / Self Care | Payer: Medicare Other | Source: Ambulatory Visit | Attending: Internal Medicine | Admitting: Internal Medicine

## 2021-05-01 ENCOUNTER — Other Ambulatory Visit (INDEPENDENT_AMBULATORY_CARE_PROVIDER_SITE_OTHER): Payer: Self-pay | Admitting: Internal Medicine

## 2021-05-01 DIAGNOSIS — Z1231 Encounter for screening mammogram for malignant neoplasm of breast: Secondary | ICD-10-CM | POA: Insufficient documentation

## 2021-05-01 IMAGING — MG MM DIGITAL SCREENING BILAT W/ TOMO AND CAD
8 series · 8 of 24 positions shown · non-contrast
Comparison: Previous exam(s).

CLINICAL DATA: Screening.

EXAM:
DIGITAL SCREENING BILATERAL MAMMOGRAM WITH TOMOSYNTHESIS AND CAD
TECHNIQUE: Bilateral screening digital craniocaudal and mediolateral oblique
mammograms were obtained. Bilateral screening digital breast
tomosynthesis was performed. The images were evaluated with
computer-aided detection.

[L CC synth-2D]
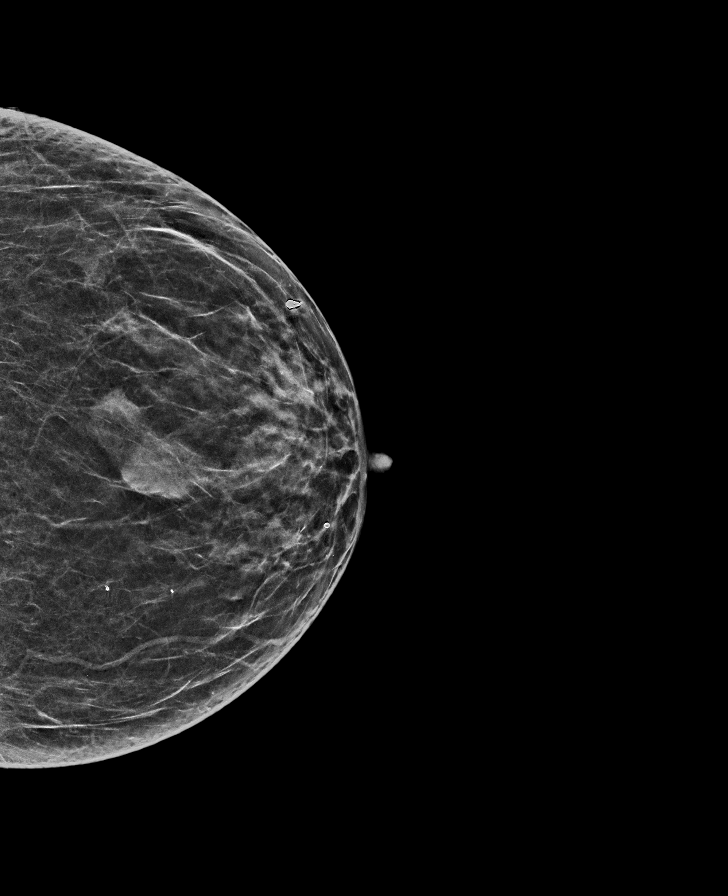

[R MLO synth-2D]
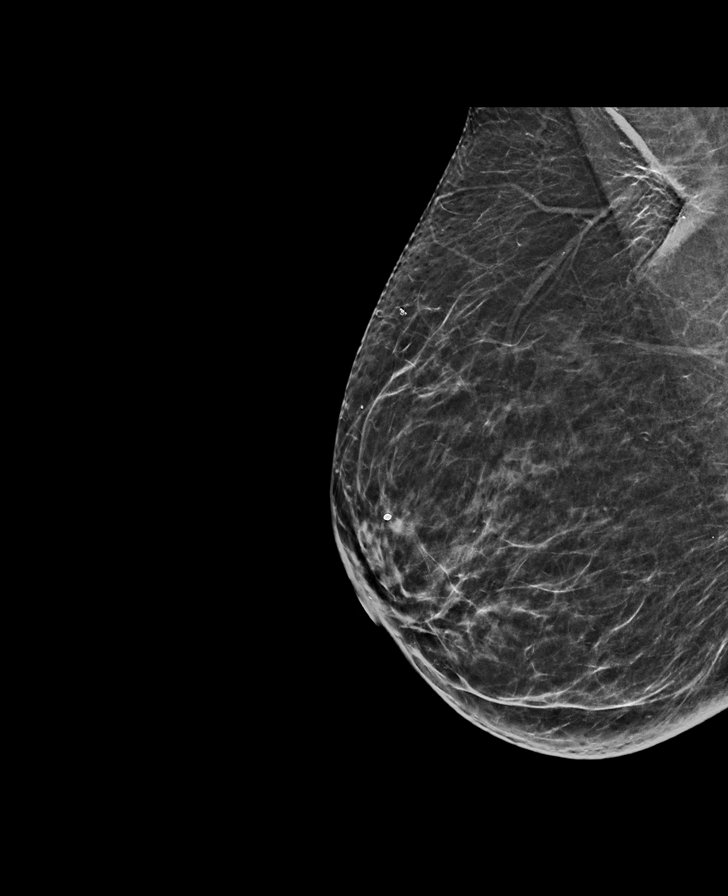

[R CC synth-2D]
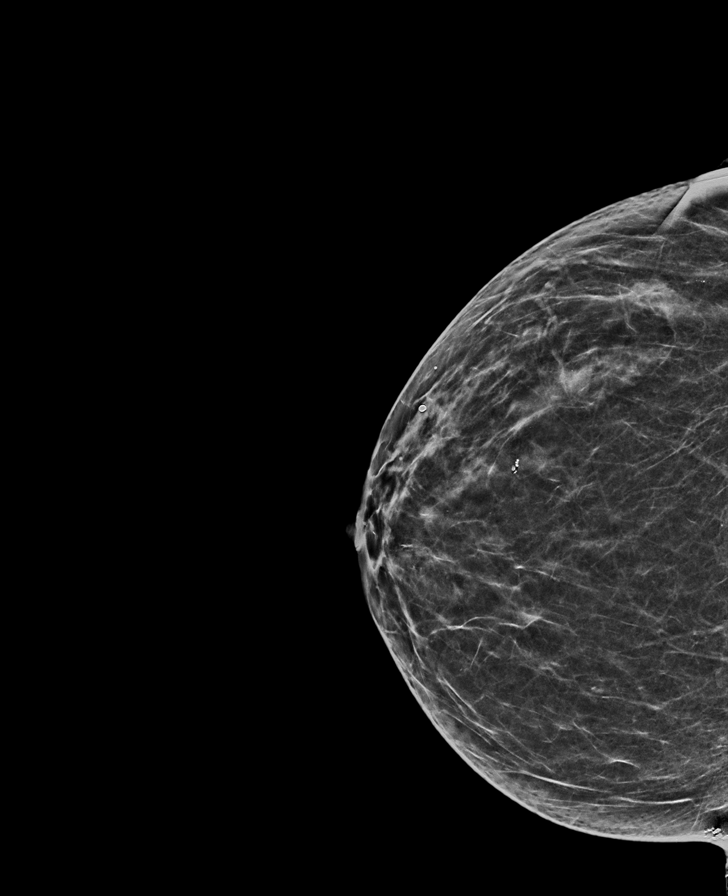

[L MLO synth-2D]
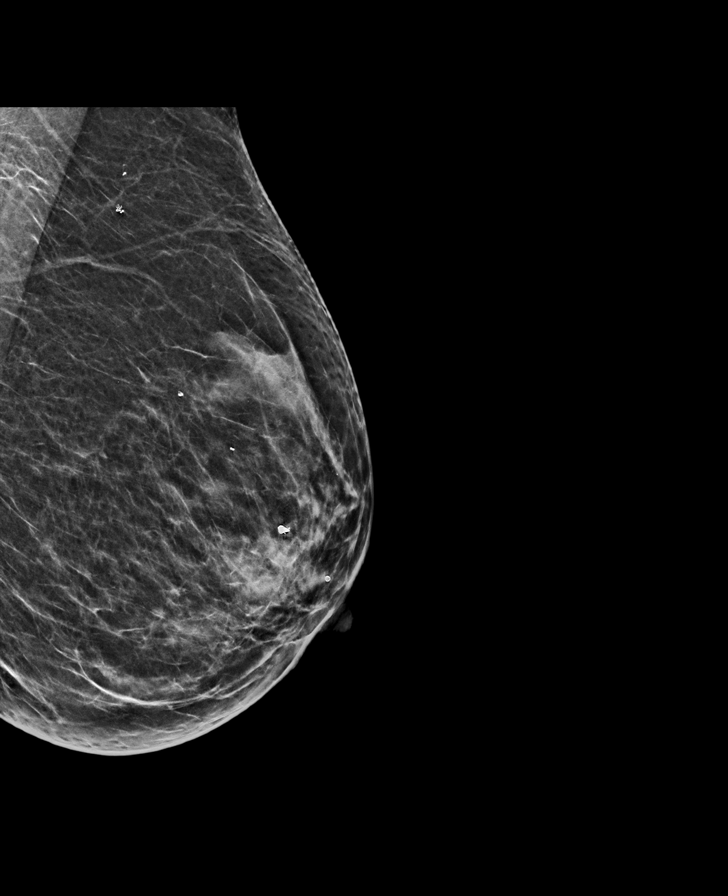

[R CC tomo · tomo slice 30/59.0]
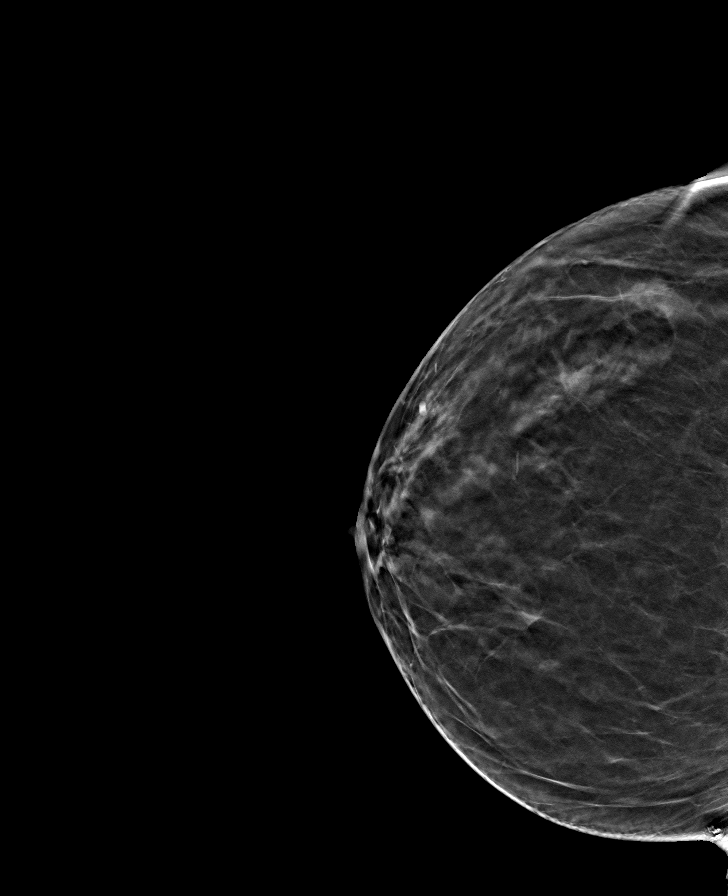

[L CC tomo · tomo slice 29/57.0]
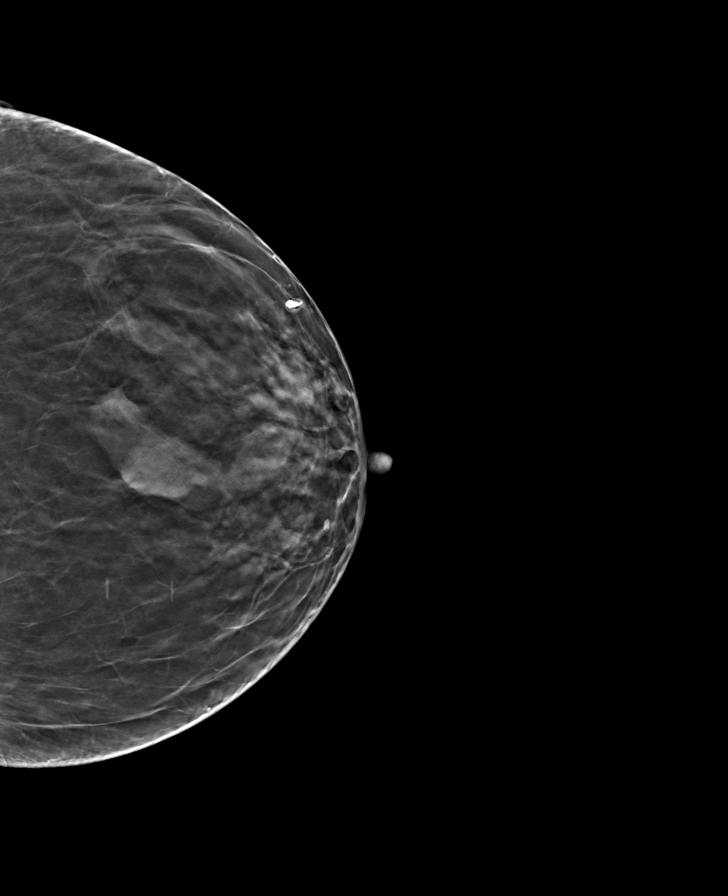

[L MLO tomo · tomo slice 31/60.0]
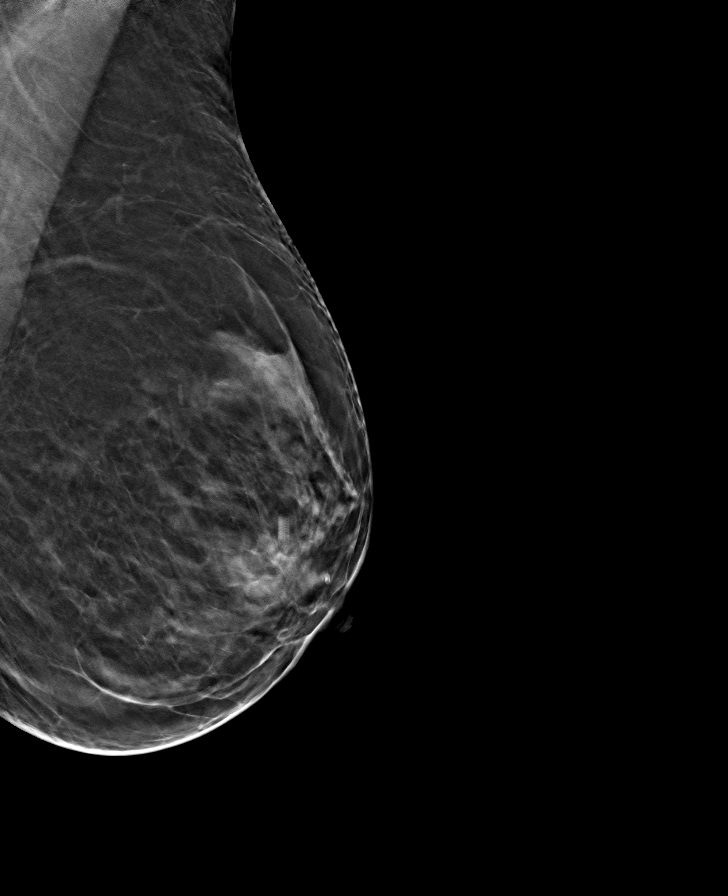

[R MLO tomo · tomo slice 31/61.0]
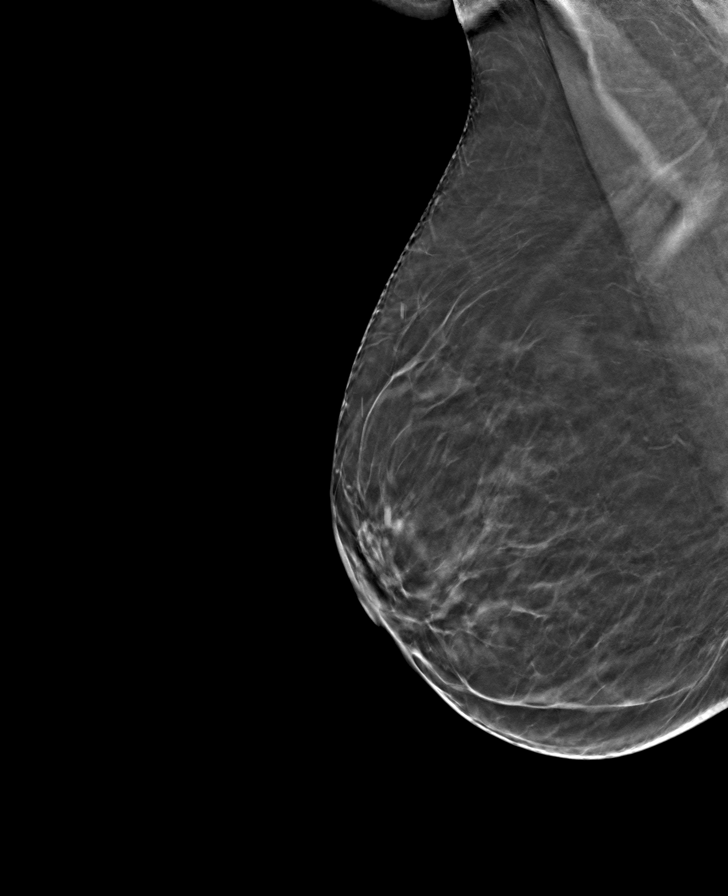

[8 of 24 positions shown; findings below may reference images not displayed]

ACR Breast Density Category b: There are scattered areas of
fibroglandular density.
FINDINGS: There are no findings suspicious for malignancy.
IMPRESSION: No mammographic evidence of malignancy. A result letter of this
screening mammogram will be mailed directly to the patient.

RECOMMENDATION:
Screening mammogram in one year. (Code:[BY])

BI-RADS CATEGORY  1: Negative.

## 2021-05-01 MED ORDER — ZOSTER VAC RECOMB ADJUVANTED 50 MCG/0.5ML IM SUSR: 0.5000 mL | Freq: Once | INTRAMUSCULAR | 0 refills | Status: AC

## 2021-05-01 MED ORDER — ZOSTER VAC RECOMB ADJUVANTED 50 MCG/0.5ML IM SUSR: 0.5000 mL | Freq: Once | INTRAMUSCULAR | 0 refills | Status: DC

## 2021-05-01 NOTE — Telephone Encounter (Signed)
Dr Darnell Level can you order both there Shingrix RX then I can send over to [facility] to have shipped to office and  administer here.

## 2021-05-02 ENCOUNTER — Other Ambulatory Visit (INDEPENDENT_AMBULATORY_CARE_PROVIDER_SITE_OTHER): Payer: Medicare Other

## 2021-05-02 ENCOUNTER — Other Ambulatory Visit (INDEPENDENT_AMBULATORY_CARE_PROVIDER_SITE_OTHER): Payer: Self-pay | Admitting: Internal Medicine

## 2021-05-02 ENCOUNTER — Encounter (INDEPENDENT_AMBULATORY_CARE_PROVIDER_SITE_OTHER): Payer: Self-pay | Admitting: Internal Medicine

## 2021-05-02 DIAGNOSIS — E538 Deficiency of other specified B group vitamins: Secondary | ICD-10-CM

## 2021-05-02 NOTE — Telephone Encounter (Signed)
Please advise 

## 2021-05-03 ENCOUNTER — Encounter (INDEPENDENT_AMBULATORY_CARE_PROVIDER_SITE_OTHER): Payer: Self-pay | Admitting: Internal Medicine

## 2021-05-03 LAB — B12 AND FOLATE PANEL
Folate: 10.5 ng/mL
Vitamin B-12: 1624 pg/mL — ABNORMAL HIGH (ref 200–1100)

## 2021-05-05 ENCOUNTER — Other Ambulatory Visit (HOSPITAL_COMMUNITY): Payer: 59

## 2021-05-10 DIAGNOSIS — H31001 Unspecified chorioretinal scars, right eye: Secondary | ICD-10-CM | POA: Diagnosis not present

## 2021-05-10 DIAGNOSIS — H40013 Open angle with borderline findings, low risk, bilateral: Secondary | ICD-10-CM | POA: Diagnosis not present

## 2021-05-10 DIAGNOSIS — Z961 Presence of intraocular lens: Secondary | ICD-10-CM | POA: Diagnosis not present

## 2021-05-10 DIAGNOSIS — H35371 Puckering of macula, right eye: Secondary | ICD-10-CM | POA: Diagnosis not present

## 2021-05-10 DIAGNOSIS — H353131 Nonexudative age-related macular degeneration, bilateral, early dry stage: Secondary | ICD-10-CM | POA: Diagnosis not present

## 2021-05-15 ENCOUNTER — Emergency Department (HOSPITAL_COMMUNITY): Payer: Medicare Other

## 2021-05-15 ENCOUNTER — Emergency Department (HOSPITAL_COMMUNITY)
Admission: EM | Admit: 2021-05-15 | Discharge: 2021-05-15 | Disposition: A | Discharge status: Home / Self Care | Payer: Medicare Other | Attending: Emergency Medicine | Admitting: Emergency Medicine

## 2021-05-15 ENCOUNTER — Other Ambulatory Visit: Payer: Self-pay

## 2021-05-15 ENCOUNTER — Encounter (HOSPITAL_COMMUNITY): Payer: Self-pay | Admitting: Emergency Medicine

## 2021-05-15 DIAGNOSIS — R200 Anesthesia of skin: Secondary | ICD-10-CM | POA: Diagnosis not present

## 2021-05-15 DIAGNOSIS — R202 Paresthesia of skin: Secondary | ICD-10-CM | POA: Insufficient documentation

## 2021-05-15 DIAGNOSIS — Z7901 Long term (current) use of anticoagulants: Secondary | ICD-10-CM | POA: Insufficient documentation

## 2021-05-15 DIAGNOSIS — Z955 Presence of coronary angioplasty implant and graft: Secondary | ICD-10-CM | POA: Insufficient documentation

## 2021-05-15 DIAGNOSIS — R519 Headache, unspecified: Secondary | ICD-10-CM | POA: Diagnosis not present

## 2021-05-15 DIAGNOSIS — R072 Precordial pain: Secondary | ICD-10-CM | POA: Diagnosis not present

## 2021-05-15 DIAGNOSIS — Z20822 Contact with and (suspected) exposure to covid-19: Secondary | ICD-10-CM | POA: Insufficient documentation

## 2021-05-15 DIAGNOSIS — Z87891 Personal history of nicotine dependence: Secondary | ICD-10-CM | POA: Diagnosis not present

## 2021-05-15 DIAGNOSIS — Z8616 Personal history of COVID-19: Secondary | ICD-10-CM | POA: Diagnosis not present

## 2021-05-15 DIAGNOSIS — I2510 Atherosclerotic heart disease of native coronary artery without angina pectoris: Secondary | ICD-10-CM | POA: Diagnosis not present

## 2021-05-15 DIAGNOSIS — R079 Chest pain, unspecified: Secondary | ICD-10-CM | POA: Diagnosis not present

## 2021-05-15 LAB — CBC
HCT: 37.8 % (ref 36.0–46.0)
Hemoglobin: 12.3 g/dL (ref 12.0–15.0)
MCH: 29.6 pg (ref 26.0–34.0)
MCHC: 32.5 g/dL (ref 30.0–36.0)
MCV: 90.9 fL (ref 80.0–100.0)
Platelets: 229 10*3/uL (ref 150–400)
RBC: 4.16 MIL/uL (ref 3.87–5.11)
RDW: 13 % (ref 11.5–15.5)
WBC: 7.2 10*3/uL (ref 4.0–10.5)
nRBC: 0 % (ref 0.0–0.2)

## 2021-05-15 LAB — TROPONIN I (HIGH SENSITIVITY)
Troponin I (High Sensitivity): 8 ng/L (ref <18)
Troponin I (High Sensitivity): 6 ng/L (ref <18)

## 2021-05-15 LAB — BASIC METABOLIC PANEL
Anion gap: 6 (ref 5–15)
BUN: 20 mg/dL (ref 8–23)
CO2: 27 mmol/L (ref 22–32)
Calcium: 9.3 mg/dL (ref 8.9–10.3)
Chloride: 103 mmol/L (ref 98–111)
Creatinine, Ser: 0.66 mg/dL (ref 0.44–1.00)
GFR, Estimated: >60 mL/min (ref >60)
Glucose, Bld: 104 mg/dL — ABNORMAL HIGH (ref 70–99)
Potassium: 4 mmol/L (ref 3.5–5.1)
Sodium: 136 mmol/L (ref 135–145)

## 2021-05-15 LAB — PROTIME-INR
INR: 3.6 — ABNORMAL HIGH (ref 0.8–1.2)
Prothrombin Time: 35.6 seconds — ABNORMAL HIGH (ref 11.4–15.2)

## 2021-05-15 LAB — SARS CORONAVIRUS 2 (TAT 6-24 HRS): SARS Coronavirus 2: NEGATIVE

## 2021-05-15 IMAGING — MR MR HEAD W/O CM
6 of 10 series · 28 of 48 positions shown · non-contrast
Comparison: Head CT without contrast [DH] hours today. Cervical
spine MRI [DATE].

CLINICAL DATA: 75-year-old female TIMOTHY by chest pain. Numbness in
the left arm and headache.

EXAM:
MRI HEAD WITHOUT CONTRAST
TECHNIQUE: Multiplanar, multiecho pulse sequences of the brain and surrounding
structures were obtained without intravenous contrast.

[Series 2: DWI · axial · 3.0mm · 0.94mm/px · z∈[-9,+121]mm · 8 of 89 slices shown (1 of 2)]
[im 1/89]
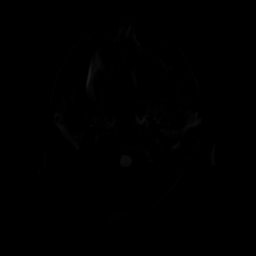
[im 10/89]
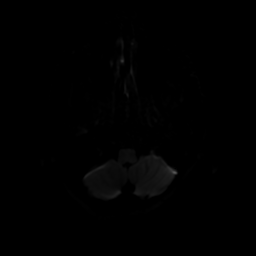
[im 30/89]
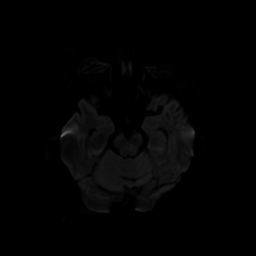
[im 40/89]
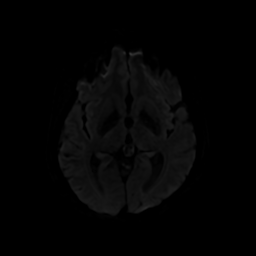
[im 49/89]
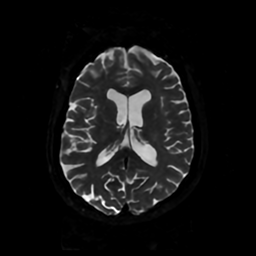
[im 59/89]
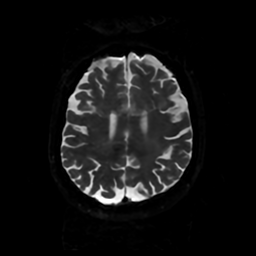
[im 79/89]
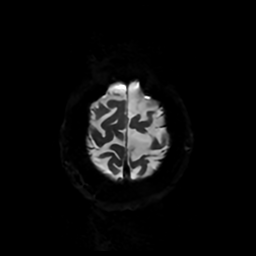
[im 89/89]
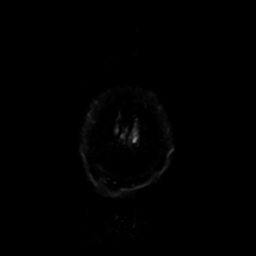

[Series 3: DWI · coronal · 4.0mm · 0.94mm/px · 7 of 66 slices shown (2 of 2)]
[im 1/66]
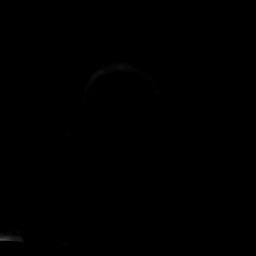
[im 11/66]
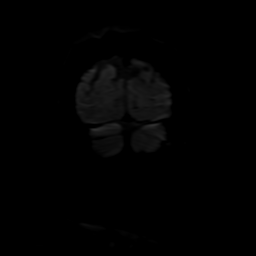
[im 22/66]
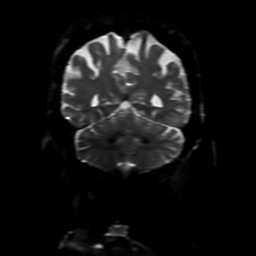
[im 33/66]
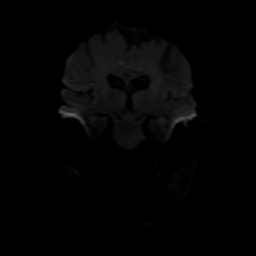
[im 44/66]
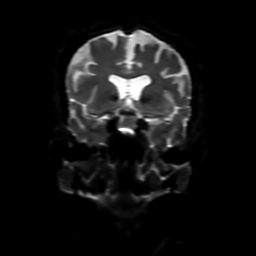
[im 55/66]
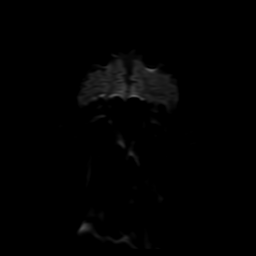
[im 66/66]
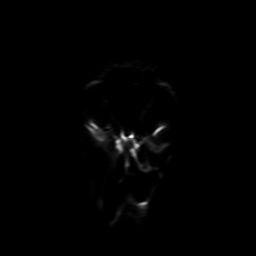

[Series 4: FLAIR · axial · 4.0mm · 0.45mm/px · z∈[-12,+123]mm · 3 of 32 slices shown (1 of 2)]
[im 1/32]
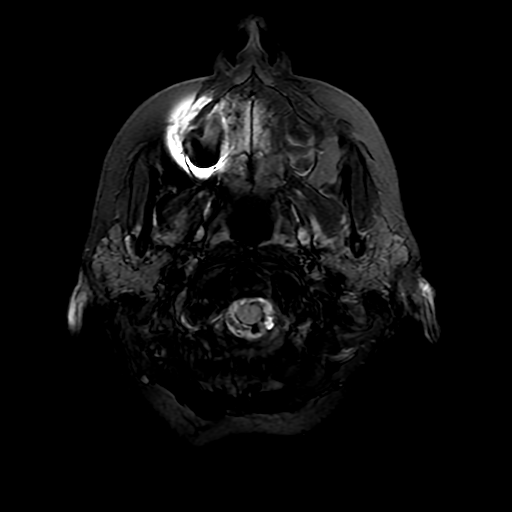
[im 16/32]
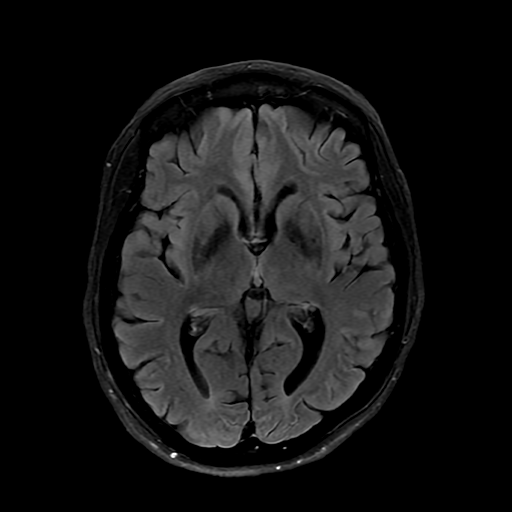
[im 32/32]
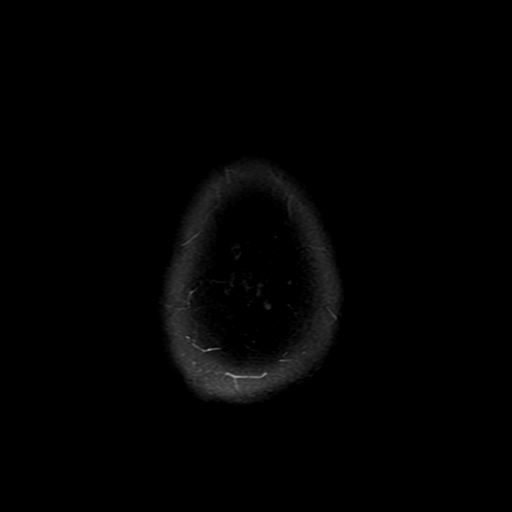

[Series 7: FLAIR · sagittal · 5.0mm · 0.23mm/px · 2 of 23 slices shown (2 of 2)]
[im 1/23]
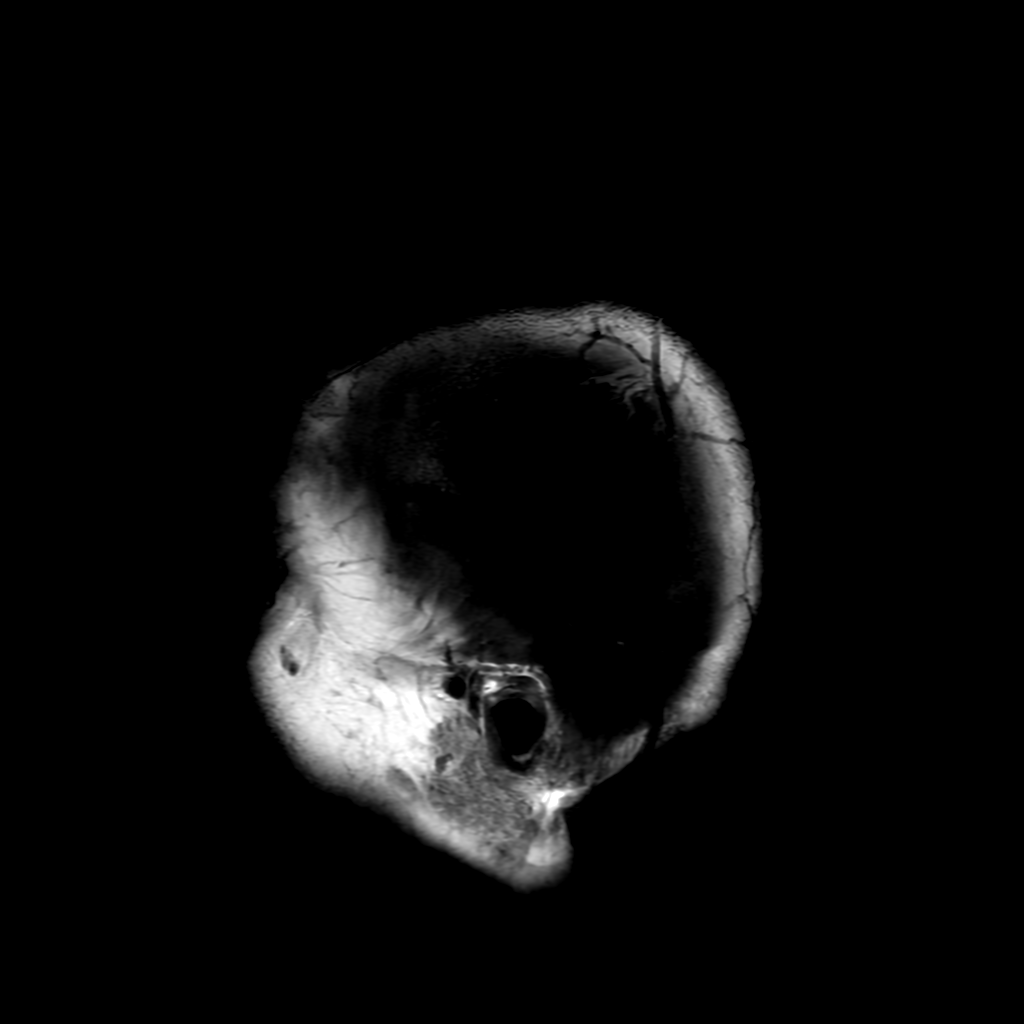
[im 23/23]
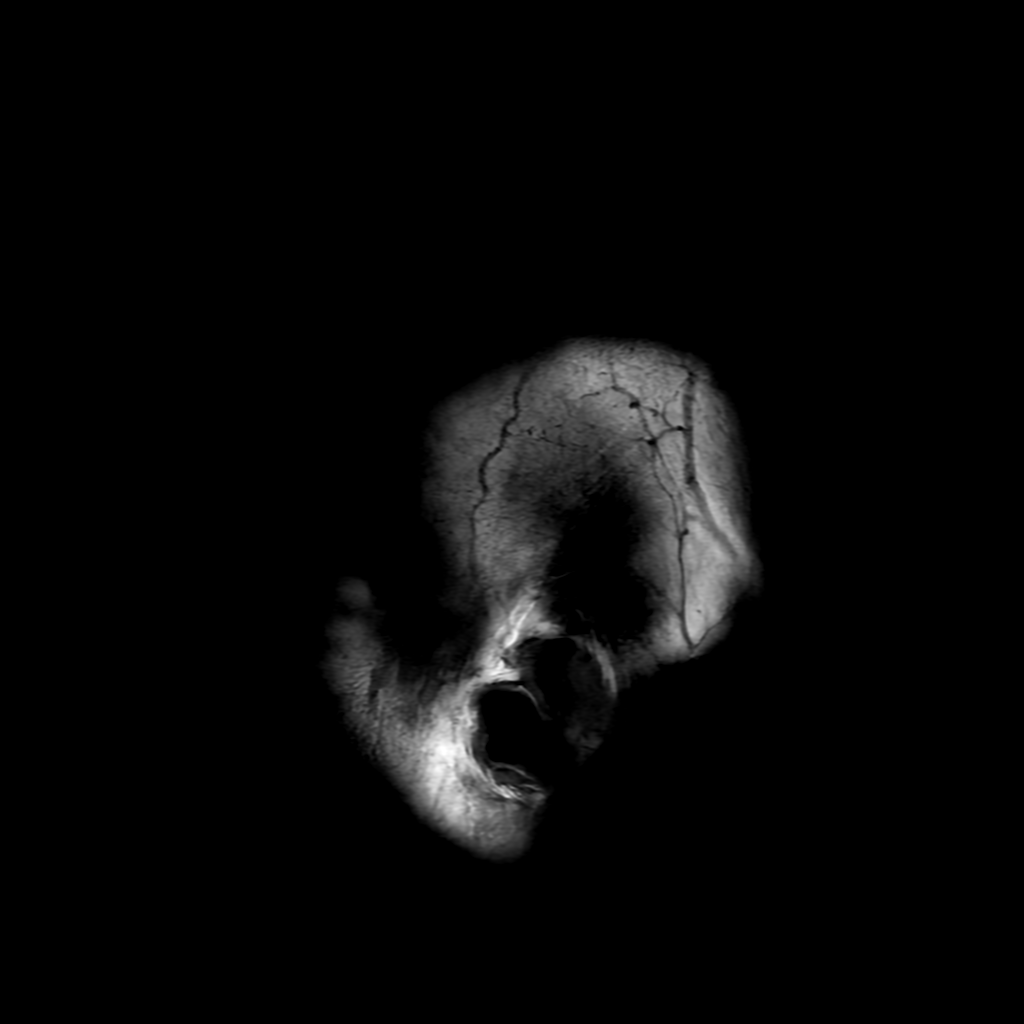

[Series 250: ADC · axial · 3.0mm · 0.94mm/px · z∈[-9,+121]mm · 5 of 45 slices shown (1 of 2)]
[im 1/45]
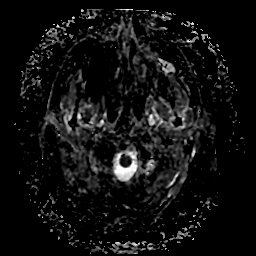
[im 12/45]
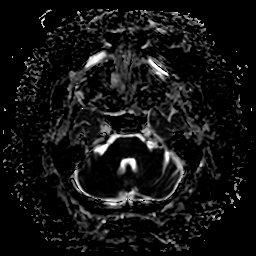
[im 23/45]
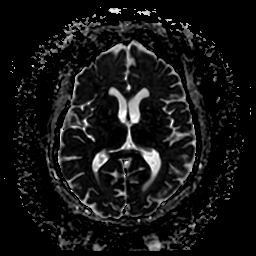
[im 34/45]
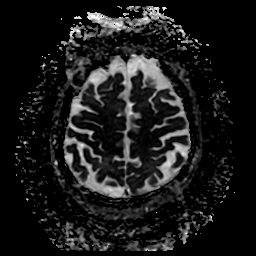
[im 45/45]
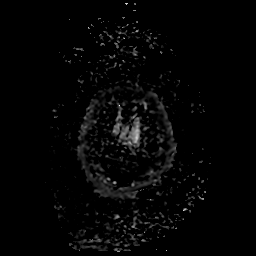

[Series 350: ADC · coronal · 4.0mm · 0.94mm/px · 3 of 34 slices shown (2 of 2)]
[im 1/34]
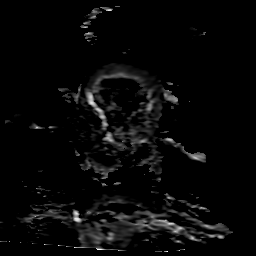
[im 17/34]
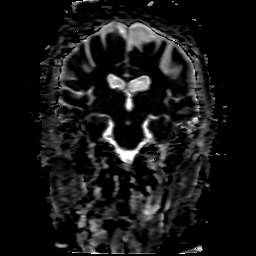
[im 34/34]
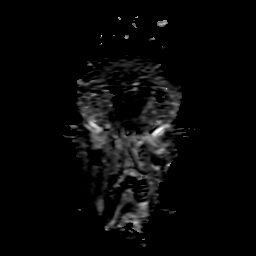

[28 of 48 positions shown; findings below may reference images not displayed]

FINDINGS: Brain: No restricted diffusion to suggest acute infarction. No
midline shift, mass effect, evidence of mass lesion,
ventriculomegaly, extra-axial collection or acute intracranial
hemorrhage. Cervicomedullary junction and pituitary are within
normal limits. Gray and white matter signal is within normal limits
for age throughout the brain. No cortical encephalomalacia or
definite chronic cerebral blood products identified.

Vascular: Major intracranial vascular flow voids are preserved.

Skull and upper cervical spine: Negative for age visible cervical
spine. Visualized bone marrow signal is within normal limits.

Sinuses/Orbits: Postoperative changes to both globes, otherwise
negative.

Other: Trace fluid in the left mastoid air cells, likely
postinflammatory and significance doubtful. Negative visible
nasopharynx. Right mastoids are clear and other visible internal
auditory structures appear grossly normal.
IMPRESSION: No acute intracranial abnormality. Normal for age noncontrast MRI
appearance of the brain.

## 2021-05-15 IMAGING — DX DG CHEST 2V
2 series · 2 of 2 positions shown · non-contrast
Comparison: [DATE]

CLINICAL DATA: Chest pain and numbness down the left arm.

EXAM:
CHEST - 2 VIEW

[chest pa]
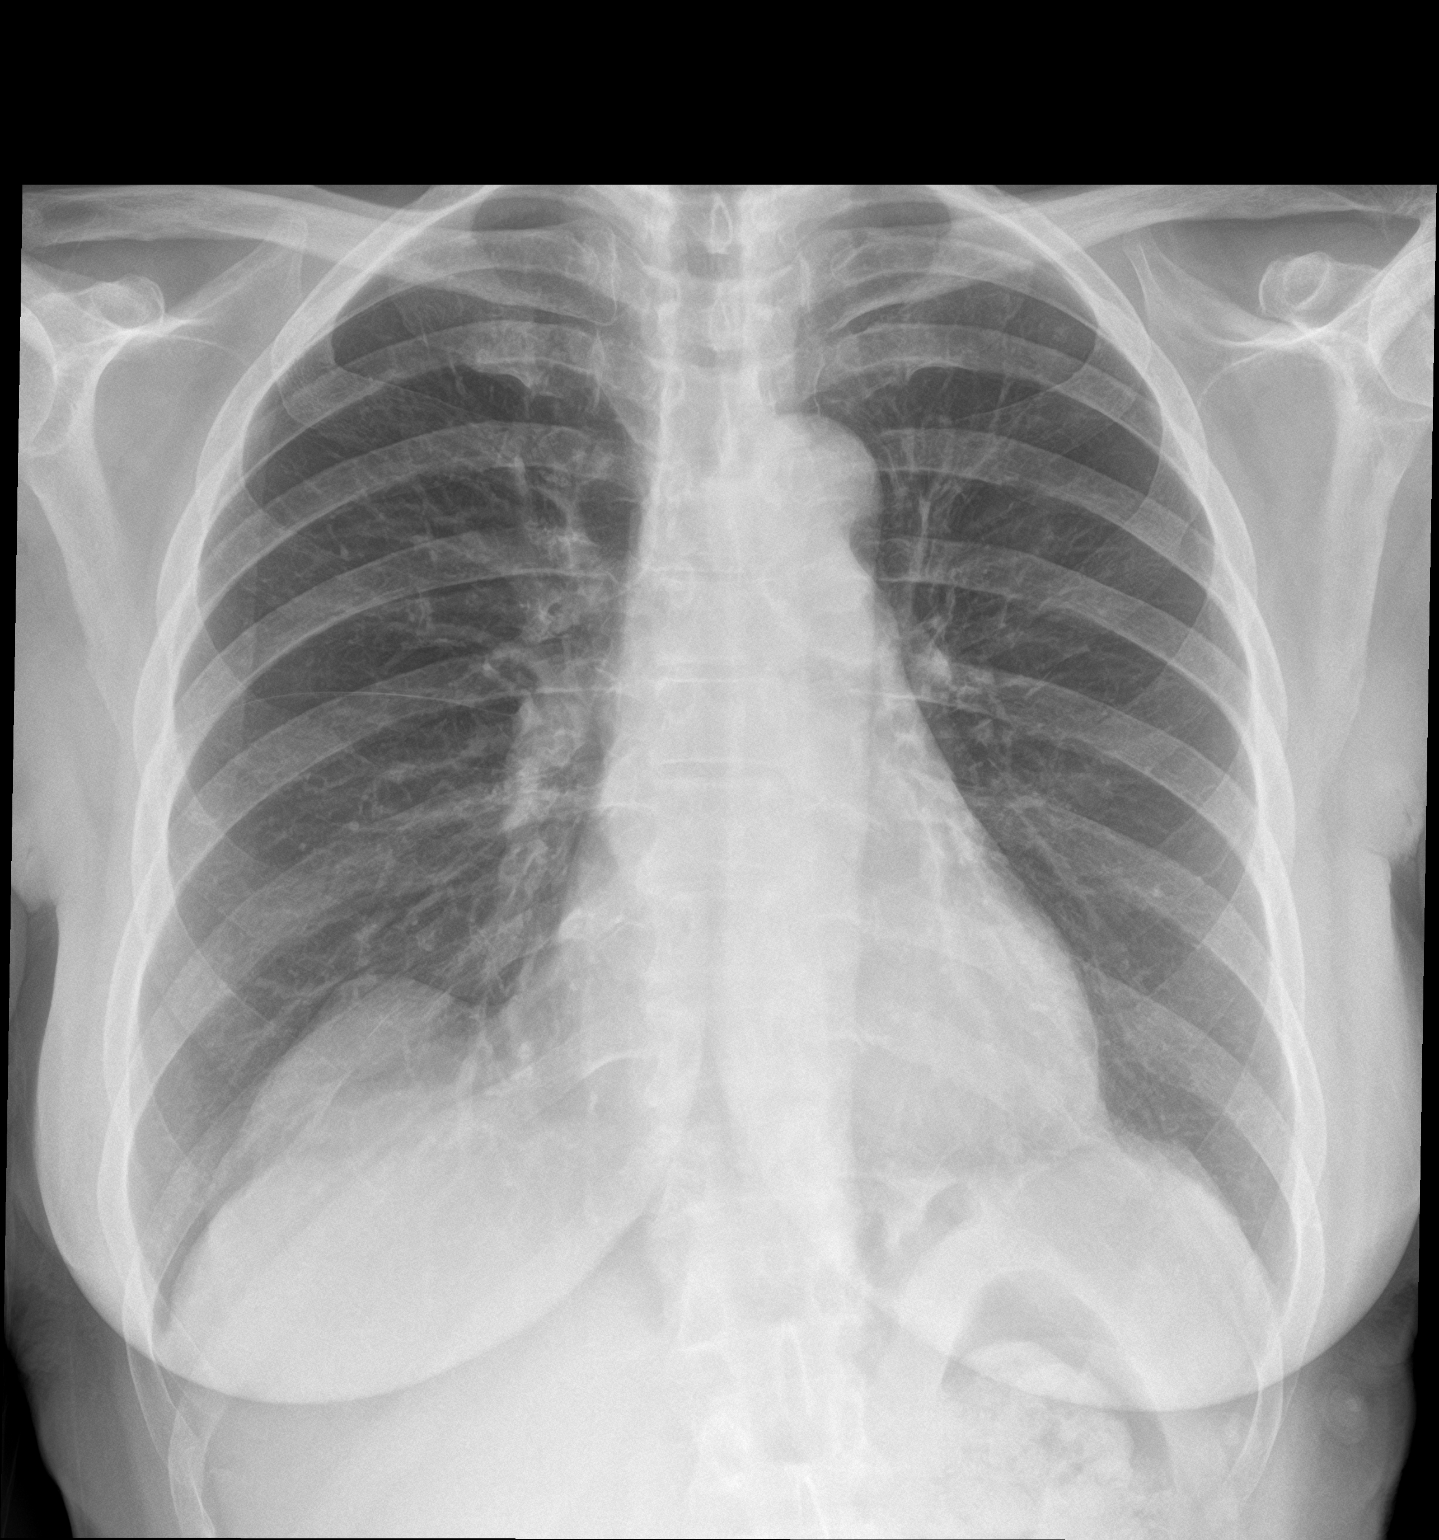

[chest lat]
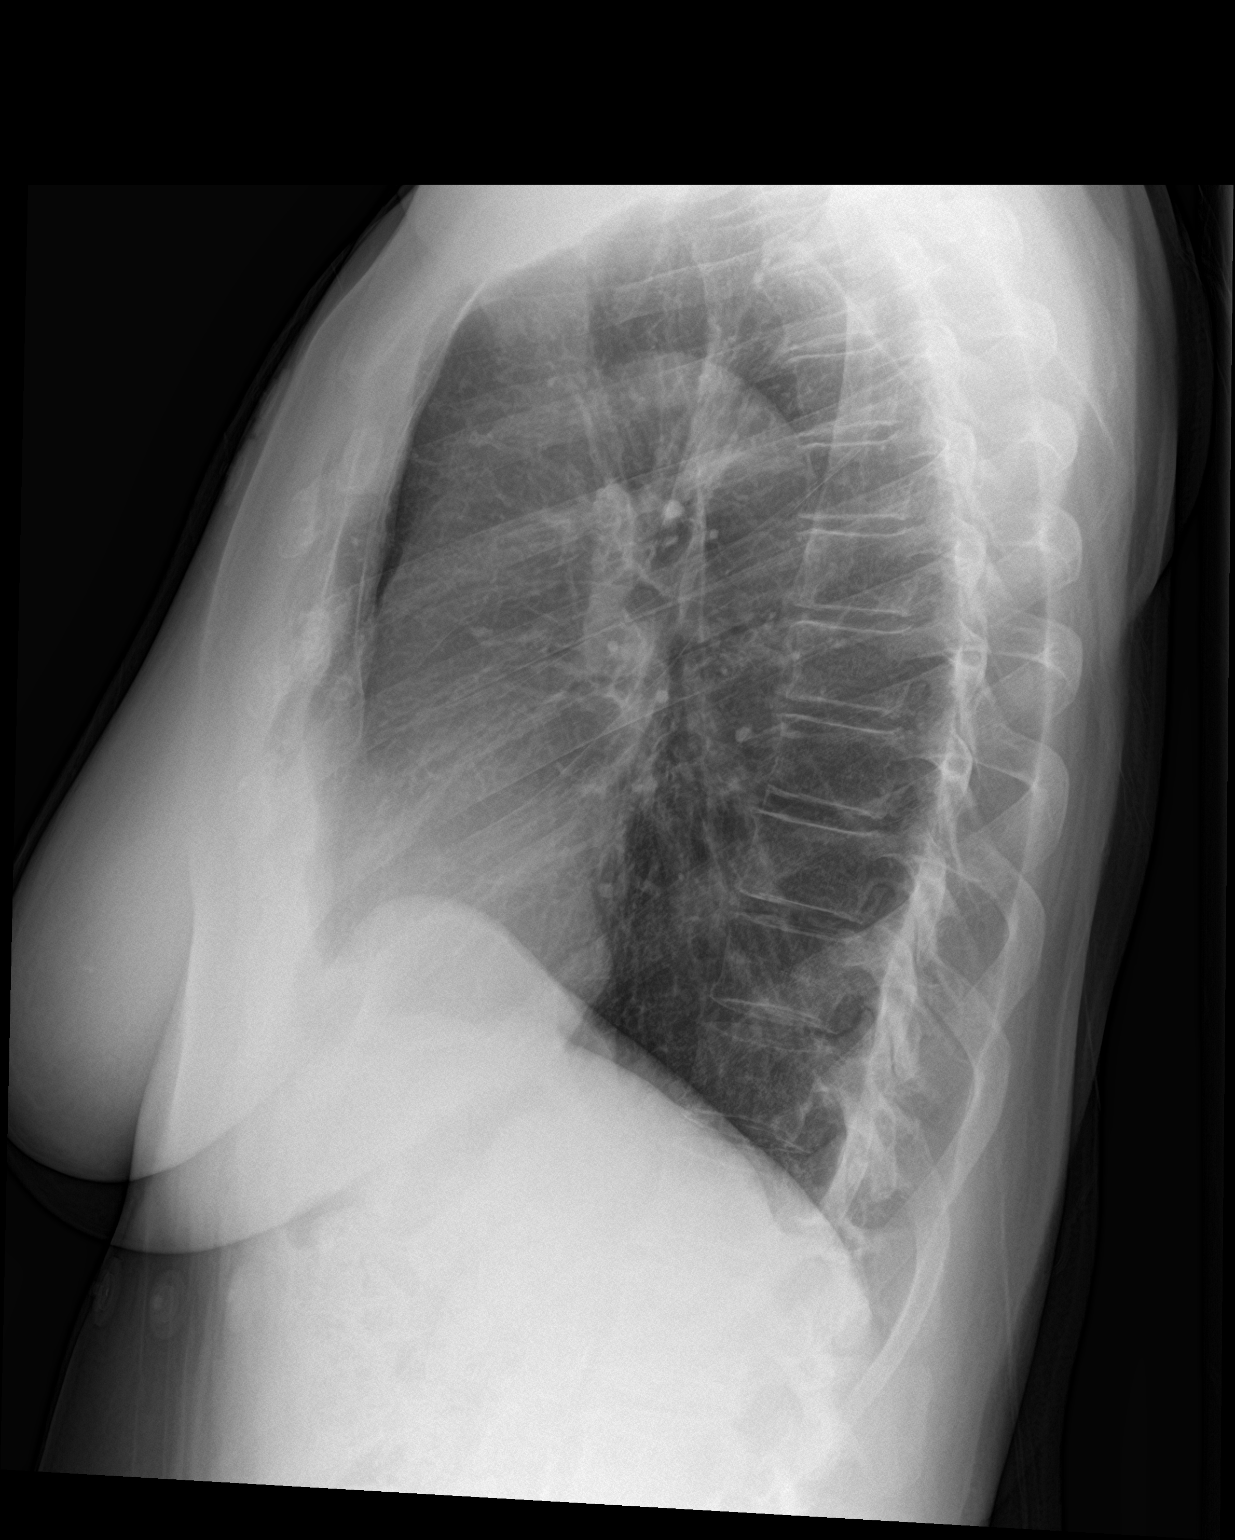

[2 of 2 positions shown; findings below may reference images not displayed]

FINDINGS: Normal heart size and pulmonary vascularity. No focal airspace
disease or consolidation in the lungs. No blunting of costophrenic
angles. No pneumothorax. Mediastinal contours appear intact.
Segmental elevation of the right hemidiaphragm.
IMPRESSION: No active cardiopulmonary disease.

## 2021-05-15 IMAGING — CT CT HEAD W/O CM
3 series · 15 of 47 positions shown, 18 images · non-contrast
Comparison: Cervical spine MRI [DATE].

CLINICAL DATA: 75-year-old female woken by chest pain. Numbness in
the left arm and mild headache.

EXAM:
CT HEAD WITHOUT CONTRAST
TECHNIQUE: Contiguous axial images were obtained from the base of the skull
through the vertex without intravenous contrast.

[Series 2: head w o · axial · 0.44mm/px · z∈[+1496,+1621]mm · 9 of 30 slices shown, 12 images]
[im 3/30  brain]
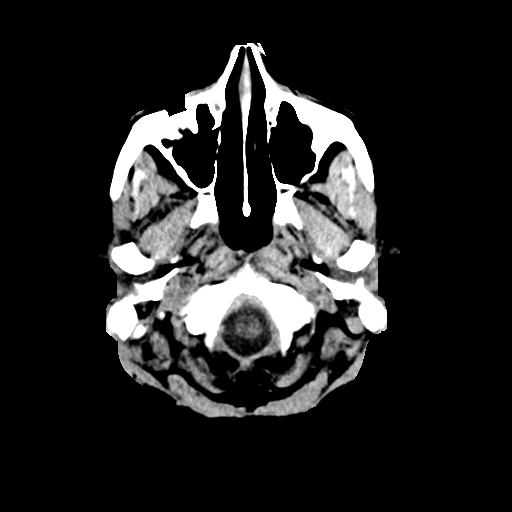
[im 3/30  bone]
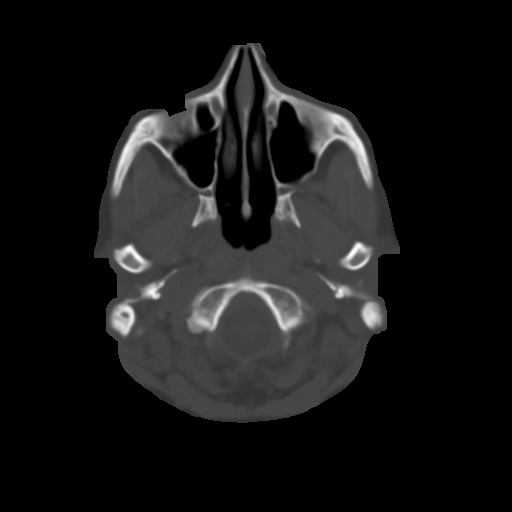
[im 6/30  brain]
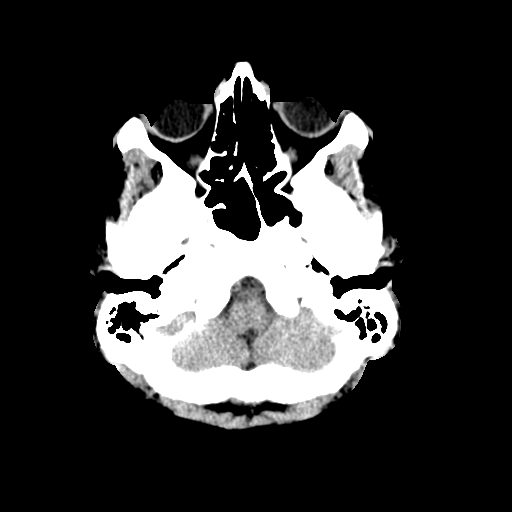
[im 9/30  brain]
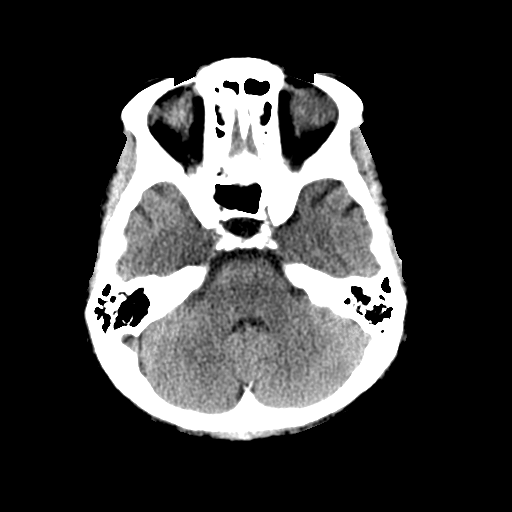
[im 12/30  brain]
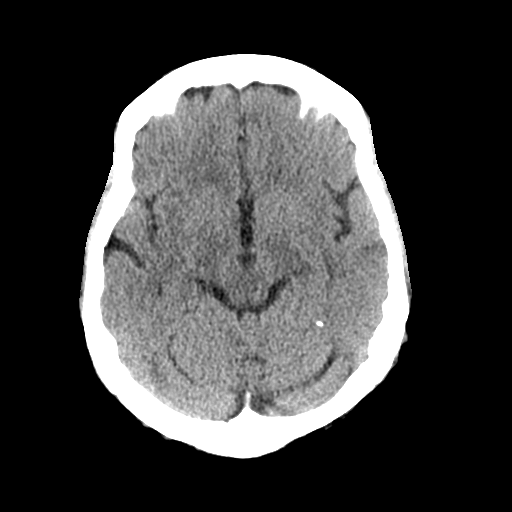
[im 16/30  brain]
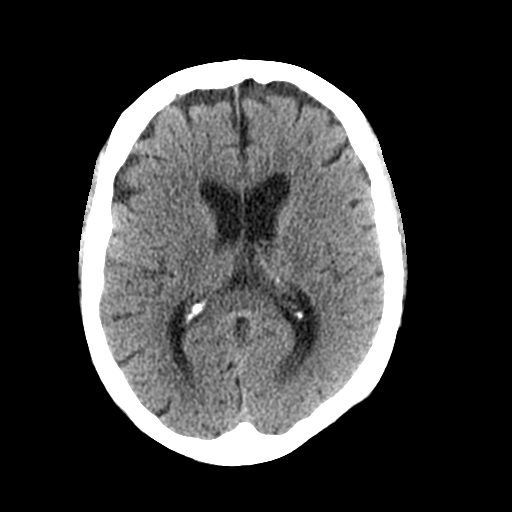
[im 16/30  bone]
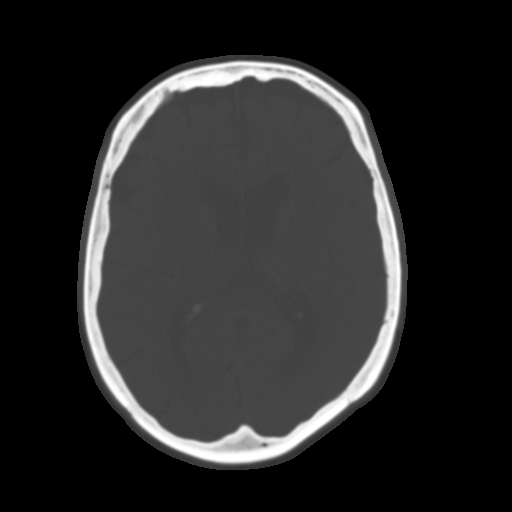
[im 19/30  brain]
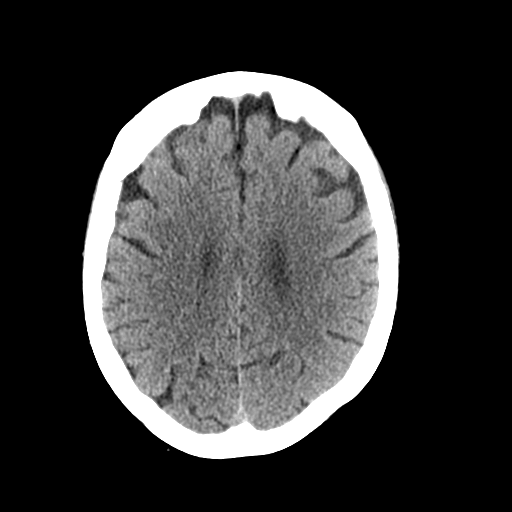
[im 22/30  brain]
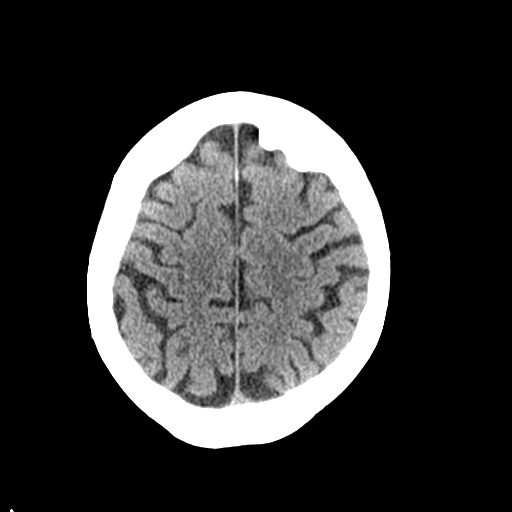
[im 25/30  brain]
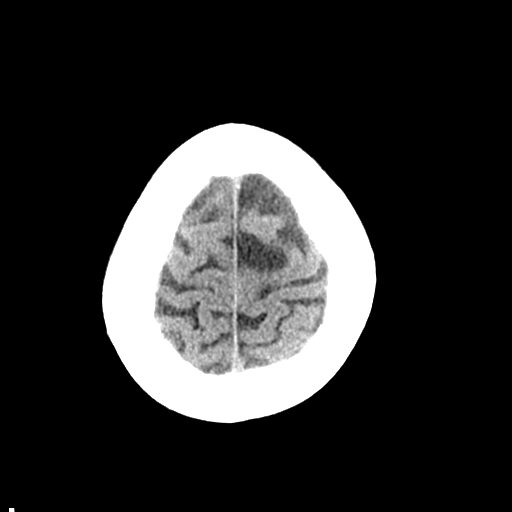
[im 28/30  brain]
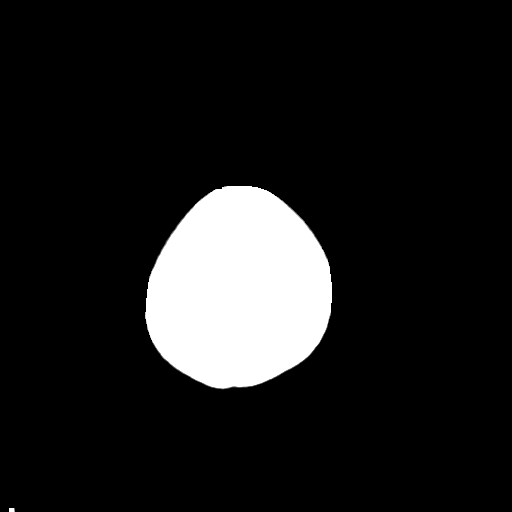
[im 28/30  bone]
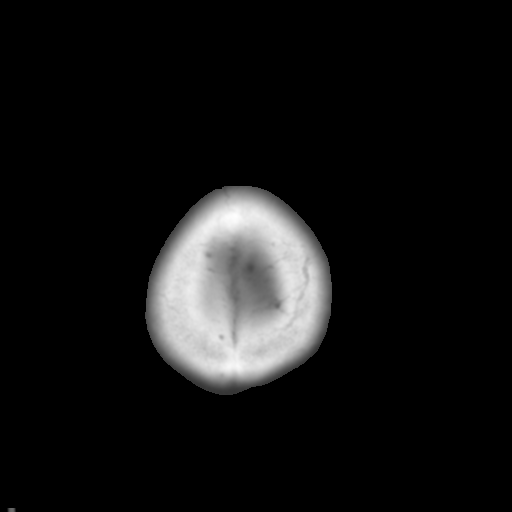

[Series 4: coronal soft · coronal · 0.33mm/px · 3 of 70 slices shown]
[im 24/70  brain]
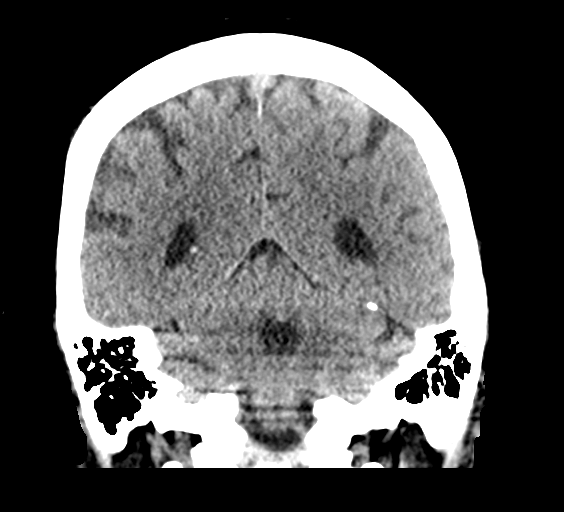
[im 31/70  brain]
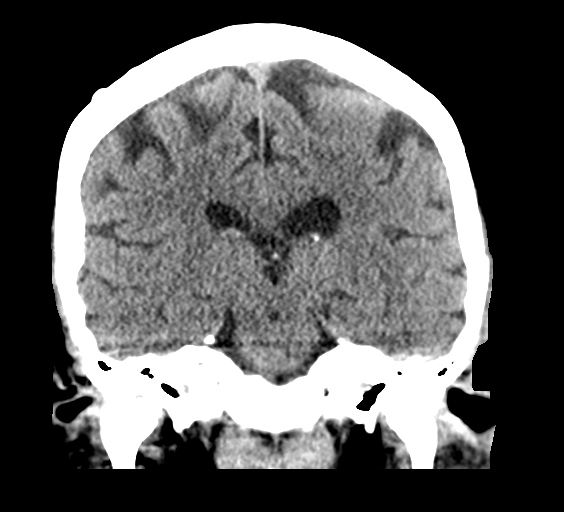
[im 39/70  brain]
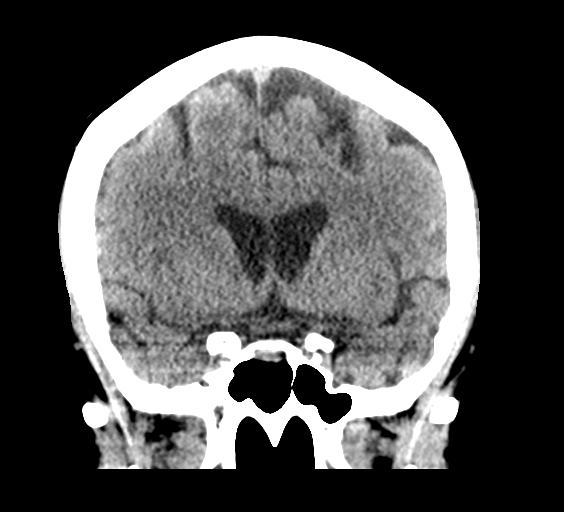

[Series 5: sagittal soft · sagittal · 0.33mm/px · 3 of 59 slices shown]
[im 20/59  brain]
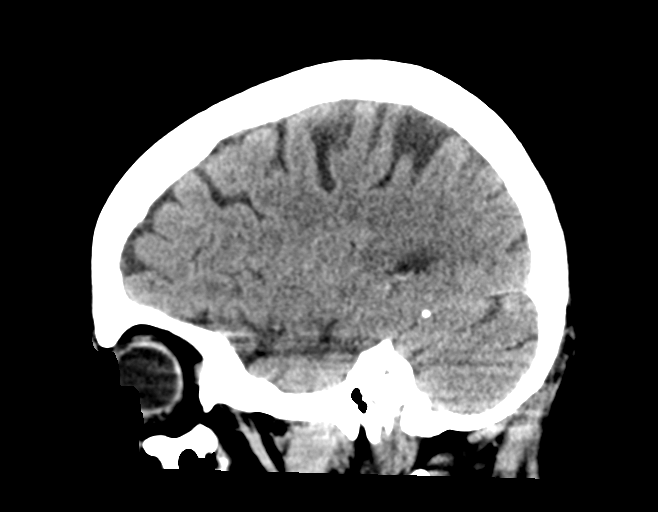
[im 30/59  brain]
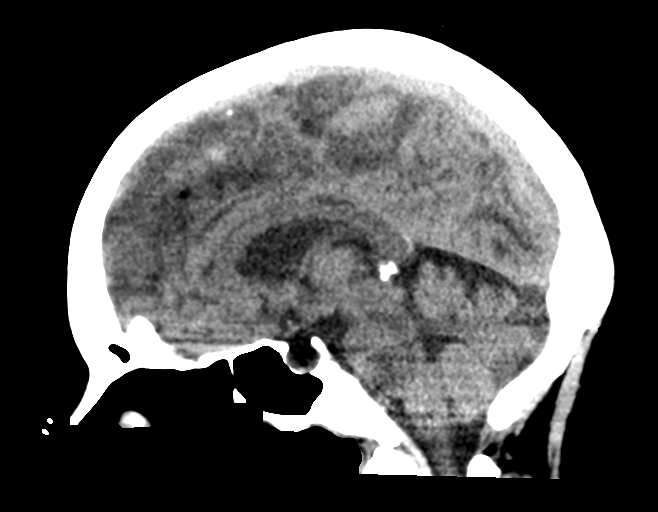
[im 39/59  brain]
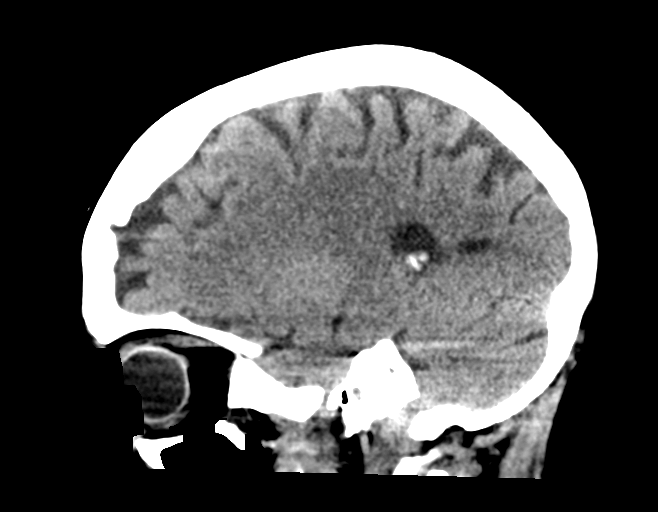

[15 of 47 positions shown; findings below may reference images not displayed]

FINDINGS: Brain: Cerebral volume is within normal limits for age. No midline
shift, ventriculomegaly, mass effect, evidence of mass lesion,
intracranial hemorrhage or evidence of cortically based acute
infarction. Gray-white matter differentiation is within normal
limits throughout the brain. No encephalomalacia identified.

Vascular: Mild Calcified atherosclerosis at the skull base. No
suspicious intracranial vascular hyperdensity.

Skull: Negative.  Mild hyperostosis, normal variant.

Sinuses/Orbits: Visualized paranasal sinuses and mastoids are clear.

Other: No acute orbit or scalp soft tissue finding.
IMPRESSION: Normal for age noncontrast Head CT.

## 2021-05-15 MED ORDER — LORAZEPAM 2 MG/ML IJ SOLN
1.0000 mg | Freq: Once | INTRAMUSCULAR | Status: AC
  Administered 2021-05-15: 1 mg via INTRAVENOUS
  Filled 2021-05-15: qty 1

## 2021-05-15 MED ORDER — MORPHINE SULFATE (PF) 2 MG/ML IV SOLN
2.0000 mg | Freq: Once | INTRAVENOUS | Status: AC
  Administered 2021-05-15: 2 mg via INTRAVENOUS
  Filled 2021-05-15: qty 1

## 2021-05-15 MED ORDER — ONDANSETRON HCL 4 MG/2ML IJ SOLN
4.0000 mg | Freq: Once | INTRAMUSCULAR | Status: AC
  Administered 2021-05-15: 4 mg via INTRAVENOUS
  Filled 2021-05-15: qty 2

## 2021-05-15 NOTE — ED Notes (Signed)
Patient transported to CT 

## 2021-05-15 NOTE — ED Notes (Signed)
Pt returned from Radiology.

## 2021-05-15 NOTE — ED Notes (Signed)
Called [facility] for report. Charge nurse not available at this time. ED secretary made aware [facility] truck is in route to their ED. Number given for return call for report.

## 2021-05-15 NOTE — ED Provider Notes (Signed)
[facility] EMERGENCY DEPARTMENT Provider Note   CSN: [id number] Arrival date & time: 05/15/21  8299     History Chief Complaint  Patient presents with   Chest Pain    Heather [name] is a 75 y.o. female.  Patient is a 75 year old female with past medical history of coronary artery disease, hyperlipidemia, anxiety, and anemia, paroxysmal A. fib.  Patient presenting today for evaluation of numbness.  Patient states that she woke from sleep at approximately 3 AM with chest discomfort, then developed "numbness in the left side of her body".  She denies any shortness of breath, nausea, or diaphoresis.  Upon reviewing the patient's medical record.  It appears that she has been complaining of intermittent numbness in the left side of her body for many weeks.  She has been seen by neurology for this.  She was to have an MRI obtained as an outpatient, however she canceled this appointment.  The history is provided by the patient.  Chest Pain Pain location:  Substernal area Pain quality: tightness   Pain radiates to:  Does not radiate Pain severity:  Moderate Timing:  Constant     Past Medical History:  Diagnosis Date   Adrenal adenoma    Allergy    Anemia    Anxiety    CAD (coronary artery disease)    50-60% mid LAD at cardiac catheterization 2014 [facility])   Cataract    Depression    DJD (degenerative joint disease) of cervical spine 09/12/2016   History of cardiomyopathy    History of tobacco use    Hyperlipidemia    Neuromuscular disorder (HCC)    PAF (paroxysmal atrial fibrillation) ([place])    Diagnosis July 2017 - spontaneously resolved   Statin intolerance     Patient Active Problem List   Diagnosis Date Noted   Cognitive complaints 09/14/2020   Chronic right-sided thoracic back pain 09/14/2020   Neck pain 06/08/2020   Cough 12/14/2019   History of COVID-19 12/10/2019   Abnormal myocardial perfusion study    Muscle spasm 02/25/2018   Tubular adenoma of colon  07/19/2017   H. pylori infection 07/19/2017   Mid back pain 02/08/2017   Hx of colonic polyps 12/03/2016   Family history of colon cancer 12/03/2016   Encounter for therapeutic drug monitoring 11/21/2016   CAD (coronary artery disease) 09/12/2016   PAF (paroxysmal atrial fibrillation) ([place]) 09/12/2016   Syncope 09/12/2016   DJD (degenerative joint disease) of cervical spine 09/12/2016   Statin intolerance 09/12/2016   TIA (transient ischemic attack) 09/12/2016   Diverticulitis of colon 09/12/2016   Myofascial pain syndrome 09/06/2016   Takotsubo syndrome 09/06/2016   HLD (hyperlipidemia) 09/06/2016   Depression 09/06/2016   Recurrent UTI (urinary tract infection) 09/06/2016    Past Surgical History:  Procedure Laterality Date   ABDOMINAL HYSTERECTOMY  1980   endometriosis   CATARACT EXTRACTION Left 11/2018   LEFT HEART CATH AND CORONARY ANGIOGRAPHY N/A 09/25/2018   Procedure: LEFT HEART CATH AND CORONARY ANGIOGRAPHY;  Surgeon: Belva Crome, MD;  Location: [place] [facility];  Service: Cardiovascular;  Laterality: N/A;   TONSILLECTOMY  1954     OB History     Gravida  1   Para  0   Term      Preterm      AB  1   Living  0      SAB      IAB  1   Ectopic      Multiple  Live Births              Family History  Problem Relation Age of Onset   COPD Mother    Hearing loss Mother    Stroke Mother    Heart disease Mother        chf, died at 40   Arthritis Father    Hypertension Father    Heart disease Father        cad, pvd, died at 31   Heart disease Sister        heart valve   Arthritis Sister    Hyperlipidemia Brother    Hypertension Brother    Cancer - Prostate Brother     Social History   Tobacco Use   Smoking status: Former    Packs/day: 0.00    Pack years: 0.00    Types: Cigarettes    Start date: 11/12/1965    Quit date: 09/06/2008    Years since quitting: 12.6   Smokeless tobacco: Never  Vaping Use   Vaping Use: Never used   Substance Use Topics   Alcohol use: Yes    Alcohol/week: 1.0 standard drink    Types: 1 Glasses of wine per week    Comment: occ   Drug use: Yes    Types: Marijuana    Comment: rarely    Home Medications Prior to Admission medications   Medication Sig Start Date End Date Taking? Authorizing Provider  Biotin 10000 MCG TABS Take 10,000 mcg by mouth daily.     [provider]  buPROPion (WELLBUTRIN SR) 150 MG 12 hr tablet TAKE ONE TABLET BY MOUTH TWICE A DAY 12/05/20   Hurshel Party C, MD  Cholecalciferol (VITAMIN D3) 125 MCG (5000 UT) TABS Take 15,000 Units by mouth daily.     [provider]  Coenzyme Q10 100 MG capsule Take 100 mg by mouth daily.     [provider]  diazepam (VALIUM) 5 MG tablet Take 1 tablet (5 mg total) by mouth daily as needed for anxiety. 04/27/21   Doree Albee, MD  docusate sodium (COLACE) 100 MG capsule Take 100 mg by mouth 2 (two) times daily.     [provider]  estradiol (ESTRACE) 0.5 MG tablet Take 1 tablet (0.5 mg total) by mouth daily. 04/06/21   Doree Albee, MD  estradiol (ESTRACE) 2 MG tablet Take 1 tablet (2 mg total) by mouth daily. 03/27/21   Doree Albee, MD  lidocaine (XYLOCAINE) 5 % ointment Apply 1 application topically 3 (three) times daily as needed. 06/05/20   Long, Wonda Olds, MD  nitrofurantoin, macrocrystal-monohydrate, (MACROBID) 100 MG capsule Take 1 capsule (100 mg total) by mouth 2 (two) times daily. 01/09/21   Doree Albee, MD  nitroGLYCERIN (NITROSTAT) 0.4 MG SL tablet Place 1 tablet under the tongue every 5 minutes as needed for chest pain. 10/03/20   Satira Sark, MD  NP THYROID 120 MG tablet Take 1 tablet (120 mg total) by mouth daily before breakfast. 04/17/21   Doree Albee, MD  NP THYROID 90 MG tablet Take 1 tablet (90 mg total) by mouth daily before lunch. 04/17/21   Doree Albee, MD  oxyCODONE-acetaminophen (PERCOCET/ROXICET) 5-325 MG tablet TAKE 1 TABLET DAILY AS  NEEDED FOR SEVERE PAIN. 04/26/21   Marcial Pacas, MD  progesterone (PROMETRIUM) 200 MG capsule Take 1 capsule (200 mg total) by mouth daily. 02/21/21   Doree Albee, MD  Testosterone Propionate (FIRST-TESTOSTERONE MC) 2 %  CREA Place 5 mg onto the skin daily. 09/13/20   Doree Albee, MD  warfarin (COUMADIN) 5 MG tablet Take 1 to 1 and 1/2 tablets by mouth daily or as directed. Need to call office for INR check 03/06/21   Satira Sark, MD    Allergies    Statins and Sulfa antibiotics  Review of Systems   Review of Systems  Cardiovascular:  Positive for chest pain.  All other systems reviewed and are negative.  Physical Exam Updated Vital Signs BP 133/62   Pulse (!) 57   Temp 97.7 F (36.5 C) (Oral)   Resp 18   LMP 07/08/1979 (Approximate)   SpO2 97%   Physical Exam Vitals and nursing note reviewed.  Constitutional:      General: She is not in acute distress.    Appearance: She is well-developed. She is not diaphoretic.  HENT:     Head: Normocephalic and atraumatic.  Cardiovascular:     Rate and Rhythm: Normal rate and regular rhythm.     Heart sounds: No murmur heard.   No friction rub. No gallop.  Pulmonary:     Effort: Pulmonary effort is normal. No respiratory distress.     Breath sounds: Normal breath sounds. No wheezing.  Abdominal:     General: Bowel sounds are normal. There is no distension.     Palpations: Abdomen is soft.     Tenderness: There is no abdominal tenderness.  Musculoskeletal:        General: Normal range of motion.     Cervical back: Normal range of motion and neck supple.  Skin:    General: Skin is warm and dry.  Neurological:     General: No focal deficit present.     Mental Status: She is alert and oriented to person, place, and time.     Cranial Nerves: No cranial nerve deficit.     Motor: No weakness.    ED Results / Procedures / Treatments   Labs (all labs ordered are listed, but only abnormal results are displayed) Labs  Reviewed  BASIC METABOLIC PANEL - Abnormal; Notable for the following components:      Result Value   Glucose, Bld 104 (*)    All other components within normal limits  CBC  TROPONIN I (HIGH SENSITIVITY)    EKG EKG Interpretation  Date/Time:  Monday May 15 2021 03:42:23 EDT Ventricular Rate:  71 PR Interval:  178 QRS Duration: 86 QT Interval:  404 QTC Calculation: 439 R Axis:   75 Text Interpretation: Normal sinus rhythm Septal infarct , age undetermined Abnormal ECG Confirmed by Veryl Speak [phone]) on 05/15/2021 4:49:48 AM  Radiology DG Chest 2 View  Result Date: 05/15/2021 CLINICAL DATA:  Chest pain and numbness down the left arm. EXAM: CHEST - 2 VIEW COMPARISON:  06/05/2020 FINDINGS: Normal heart size and pulmonary vascularity. No focal airspace disease or consolidation in the lungs. No blunting of costophrenic angles. No pneumothorax. Mediastinal contours appear intact. Segmental elevation of the right hemidiaphragm. IMPRESSION: No active cardiopulmonary disease. Electronically Signed   By: Lucienne Capers M.D.   On: 05/15/2021 04:06    Procedures Procedures   Medications Ordered in ED Medications - No data to display  ED Course  I have reviewed the triage vital signs and the nursing notes.  Pertinent labs & imaging results that were available during my care of the patient were reviewed by me and considered in my medical decision making (see chart for details).  MDM Rules/Calculators/A&P  Patient presents complaining of left-sided numbness upon waking from sleep in the night.  She also describes chest discomfort that sounds atypical for cardiac pain.  She has thus far had a negative work-up.  Her INR is 3.6 and head CT is negative.  Patient reports ongoing numbness and will be sent to [facility] for an MRI as there is no MRI service here over the holiday weekend.  I have spoken with Dr. Sedonia Small who agrees to admit.  Final Clinical Impression(s) / ED Diagnoses Final  diagnoses:  None    Rx / DC Orders ED Discharge Orders     None        Veryl Speak, MD 05/15/21 [phone]

## 2021-05-15 NOTE — ED Notes (Signed)
Upon entering room to provide Pt with warm blanket, Pt was noted to have delayed/slurred speech compared to previous encounter with Pt upon rooming the Pt in [place]. Pt noted to describe left-sided weakness with numbness and tingling on left side. EDP quickly notified. Pt presented with similar symptoms last month and reports cancelling the MRI/ not attending the MRI scheduled for her stating "I didn't feel like I needed it. Im claustrophobic."

## 2021-05-15 NOTE — ED Provider Notes (Signed)
Patient is a transfer from [facility] emergency department.  Patient woke up from sleep about 3 AM with chest discomfort.  She also had numbness in the left side of her body, including face, arm, leg.  She states she has had intermittent episodes of left sided numbness that has been ongoing for several weeks.  She has been following with her neurology doctor who arranged for her to have an outpatient MRI.  This is the first time the symptoms have been persistent.  She states that they are currently still going on.  No weakness noted.  She had work-up done at [facility] that was reassuring but due to them not having an MRI, she was sent to the ED at [facility] for further MRI evaluation.  Please see note from previous provider for full history/physical exam.  She is on Coumadin for A. fib which she states she has been compliant with.  PEX:  Left upper extremity and left lower extremity with symmetric strength.  No weakness noted.  She does report decreased sensation noted to left face, left arm, left leg.   MDM:  MRI negative for acute infarct.   Delta trop is negative.   Discussed results with patient's.  I discussed with her regarding follow-up with her outpatient neurology.  She is requesting a COVID test.  She states she has had some issues with her taste.  No other symptoms.  Given reassuring work-up, lab work, patient be discharged home. At this time, patient exhibits no emergent life-threatening condition that require further evaluation in ED. Patient had ample opportunity for questions and discussion. All patient's questions were answered with full understanding. Strict return precautions discussed. Patient expresses understanding and agreement to plan.     Results for orders placed or performed during the hospital encounter of 05/15/21 (from the past 24 hour(s))  Basic metabolic panel     Status: Abnormal   Collection Time: 05/15/21  4:10 AM  Result Value Ref Range   Sodium 136 135 - 145 mmol/L    Potassium 4.0 3.5 - 5.1 mmol/L   Chloride 103 98 - 111 mmol/L   CO2 27 22 - 32 mmol/L   Glucose, Bld 104 (H) 70 - 99 mg/dL   BUN 20 8 - 23 mg/dL   Creatinine, Ser 0.66 0.44 - 1.00 mg/dL   Calcium 9.3 8.9 - 10.3 mg/dL   GFR, Estimated >60 >60 mL/min   Anion gap 6 5 - 15  CBC     Status: None   Collection Time: 05/15/21  4:10 AM  Result Value Ref Range   WBC 7.2 4.0 - 10.5 K/uL   RBC 4.16 3.87 - 5.11 MIL/uL   Hemoglobin 12.3 12.0 - 15.0 g/dL   HCT 37.8 36.0 - 46.0 %   MCV 90.9 80.0 - 100.0 fL   MCH 29.6 26.0 - 34.0 pg   MCHC 32.5 30.0 - 36.0 g/dL   RDW 13.0 11.5 - 15.5 %   Platelets 229 150 - 400 K/uL   nRBC 0.0 0.0 - 0.2 %  Troponin I (High Sensitivity)     Status: None   Collection Time: 05/15/21  4:10 AM  Result Value Ref Range   Troponin I (High Sensitivity) 8 <18 ng/L  Protime-INR     Status: Abnormal   Collection Time: 05/15/21  4:10 AM  Result Value Ref Range   Prothrombin Time 35.6 (H) 11.4 - 15.2 seconds   INR 3.6 (H) 0.8 - 1.2  Troponin I (High Sensitivity)  Status: None   Collection Time: 05/15/21 12:02 PM  Result Value Ref Range   Troponin I (High Sensitivity) 6 <18 ng/L      Portions of this note were generated with [employer]. Dictation errors may occur despite best attempts at proofreading.      Volanda Napoleon, PA-C 05/15/21 1559    Wyvonnia Dusky, MD 05/15/21 [phone]

## 2021-05-15 NOTE — ED Triage Notes (Signed)
Pt c/o chest pain that woke her up about 20 minutes ago. Pt recently scheduled for stress test on 05/22/21. Pt also c/o some numbness down her left arm.

## 2021-05-15 NOTE — Discharge Instructions (Addendum)
You have a COVID test pending.  You can check online regarding the results.  Please follow-up with your neurologist for further evaluation of your numbness.  Return to the emergency department for any chest pain, difficulty breathing, abdominal pain, nausea/vomiting or any other worsening concerning symptoms.

## 2021-05-15 NOTE — ED Notes (Signed)
Pt reports taking 2 NTG tablets PTA without relief of symptoms and now has slight headache.

## 2021-05-15 NOTE — ED Notes (Signed)
ED Provider at bedside. 

## 2021-05-16 ENCOUNTER — Ambulatory Visit (HOSPITAL_COMMUNITY): Payer: 59

## 2021-05-22 ENCOUNTER — Ambulatory Visit (INDEPENDENT_AMBULATORY_CARE_PROVIDER_SITE_OTHER): Payer: Medicare Other

## 2021-05-22 ENCOUNTER — Other Ambulatory Visit: Payer: Self-pay

## 2021-05-22 ENCOUNTER — Ambulatory Visit (HOSPITAL_BASED_OUTPATIENT_CLINIC_OR_DEPARTMENT_OTHER)
Admission: RE | Admit: 2021-05-22 | Discharge: 2021-05-22 | Disposition: A | Discharge status: Home / Self Care | Payer: Medicare Other | Source: Ambulatory Visit | Attending: Physician Assistant | Admitting: Physician Assistant

## 2021-05-22 ENCOUNTER — Ambulatory Visit (HOSPITAL_COMMUNITY)
Admission: RE | Admit: 2021-05-22 | Discharge: 2021-05-22 | Disposition: A | Discharge status: Home / Self Care | Payer: Medicare Other | Source: Ambulatory Visit | Attending: Physician Assistant | Admitting: Physician Assistant

## 2021-05-22 ENCOUNTER — Other Ambulatory Visit: Payer: Self-pay | Admitting: Physician Assistant

## 2021-05-22 DIAGNOSIS — R002 Palpitations: Secondary | ICD-10-CM

## 2021-05-22 DIAGNOSIS — R0609 Other forms of dyspnea: Secondary | ICD-10-CM

## 2021-05-22 DIAGNOSIS — R200 Anesthesia of skin: Secondary | ICD-10-CM | POA: Diagnosis not present

## 2021-05-22 DIAGNOSIS — R0600 Dyspnea, unspecified: Secondary | ICD-10-CM

## 2021-05-22 LAB — NM MYOCAR MULTI W/SPECT W/WALL MOTION / EF
Estimated workload: 7 METS
Exercise duration (min): 7 min
Exercise duration (sec): 3 s
LV dias vol: 77 mL (ref 46–106)
LV sys vol: 29 mL
MPHR: 145 {beats}/min
Peak HR: 126 {beats}/min
Percent HR: 86 %
RATE: 0.41
RPE: 14
Rest HR: 58 {beats}/min
SDS: 4
SRS: 0
SSS: 4
TID: 1.1

## 2021-05-22 LAB — ECHOCARDIOGRAM COMPLETE
AR max vel: 2.21 cm2
AV Area VTI: 2.2 cm2
AV Area mean vel: 2.27 cm2
AV Mean grad: 3.4 mmHg
AV Peak grad: 6.8 mmHg
Ao pk vel: 1.3 m/s
Area-P 1/2: 2.91 cm2
S' Lateral: 2.7 cm
Single Plane A4C EF: 59.2 %

## 2021-05-22 MED ORDER — TECHNETIUM TC 99M TETROFOSMIN IV KIT
30.0000 | PACK | Freq: Once | INTRAVENOUS | Status: AC | PRN
  Administered 2021-05-22: 30 via INTRAVENOUS

## 2021-05-22 MED ORDER — TECHNETIUM TC 99M TETROFOSMIN IV KIT
10.0000 | PACK | Freq: Once | INTRAVENOUS | Status: AC | PRN
  Administered 2021-05-22: 10.5 via INTRAVENOUS

## 2021-05-22 MED ORDER — REGADENOSON 0.4 MG/5ML IV SOLN
INTRAVENOUS | Status: AC
  Filled 2021-05-22: qty 5

## 2021-05-22 MED ORDER — SODIUM CHLORIDE FLUSH 0.9 % IV SOLN
INTRAVENOUS | Status: AC
  Filled 2021-05-22: qty 10

## 2021-05-22 NOTE — Progress Notes (Signed)
  Echocardiogram 2D Echocardiogram has been performed.  Heather [name] 05/22/2021, 12:41 PM

## 2021-05-25 ENCOUNTER — Other Ambulatory Visit: Payer: Self-pay

## 2021-05-25 ENCOUNTER — Ambulatory Visit (INDEPENDENT_AMBULATORY_CARE_PROVIDER_SITE_OTHER): Payer: Medicare Other | Admitting: *Deleted

## 2021-05-25 DIAGNOSIS — Z01818 Encounter for other preprocedural examination: Secondary | ICD-10-CM

## 2021-05-25 DIAGNOSIS — I480 Paroxysmal atrial fibrillation: Secondary | ICD-10-CM | POA: Diagnosis not present

## 2021-05-25 DIAGNOSIS — Z5181 Encounter for therapeutic drug level monitoring: Secondary | ICD-10-CM

## 2021-05-25 LAB — POCT INR: INR: 2.1 (ref 2.0–3.0)

## 2021-05-25 MED ORDER — ENOXAPARIN SODIUM 120 MG/0.8ML IJ SOSY: 120.0000 mg | PREFILLED_SYRINGE | INTRAMUSCULAR | 0 refills | Status: DC

## 2021-05-25 NOTE — Patient Instructions (Signed)
Heart Cath pending 7/20 (not scheduled yet) Labs on 05/15/21:  SCr 0.66  Hct 12.3  Hct 37.8  Plts 229    05/25/21  Take last dose of warfarin 05/26/21  No lovenox or warfarin 7/16 - 7/19  Lovenox 120mg  sq at 8am 7/20  No Lovenox in am ---------cath ---------warfarin 7.5mg  pm 7/21 - 7/25  Lovenox 120mg  in am and warfarin 7.5mg  pm 7/26  Lovenox 120mg  am -------INR appt at 2:15pm

## 2021-05-26 ENCOUNTER — Encounter: Payer: Self-pay | Admitting: Student

## 2021-05-26 ENCOUNTER — Ambulatory Visit (INDEPENDENT_AMBULATORY_CARE_PROVIDER_SITE_OTHER): Payer: Medicare Other | Admitting: Student

## 2021-05-26 VITALS — BP 124/68 | HR 60 | Ht 64.0 in | Wt 161.1 lb

## 2021-05-26 DIAGNOSIS — R200 Anesthesia of skin: Secondary | ICD-10-CM

## 2021-05-26 DIAGNOSIS — E785 Hyperlipidemia, unspecified: Secondary | ICD-10-CM

## 2021-05-26 DIAGNOSIS — I25119 Atherosclerotic heart disease of native coronary artery with unspecified angina pectoris: Secondary | ICD-10-CM | POA: Diagnosis not present

## 2021-05-26 DIAGNOSIS — R9439 Abnormal result of other cardiovascular function study: Secondary | ICD-10-CM

## 2021-05-26 DIAGNOSIS — I480 Paroxysmal atrial fibrillation: Secondary | ICD-10-CM

## 2021-05-26 NOTE — Patient Instructions (Signed)
Medication Instructions:  Your physician recommends that you continue on your current medications as directed. Please refer to the Current Medication list given to you today.  *If you need a refill on your cardiac medications before your next appointment, please call your pharmacy*   Lab Work: None If you have labs (blood work) drawn today and your tests are completely normal, you will receive your results only by: [place] (if you have MyChart) OR A paper copy in the mail If you have any lab test that is abnormal or we need to change your treatment, we will call you to review the results.   Testing/Procedures: None   Follow-Up: At [facility], you and your health needs are our priority.  As part of our continuing mission to provide you with exceptional heart care, we have created designated Provider Care Teams.  These Care Teams include your primary Cardiologist (physician) and Advanced Practice Providers (APPs -  Physician Assistants and Nurse Practitioners) who all work together to provide you with the care you need, when you need it.  We recommend signing up for the patient portal called "MyChart".  Sign up information is provided on this After Visit Summary.  MyChart is used to connect with patients for Virtual Visits (Telemedicine).  Patients are able to view lab/test results, encounter notes, upcoming appointments, etc.  Non-urgent messages can be sent to your provider as well.   To learn more about what you can do with MyChart, go to [email or web address].    Your next appointment:   3-4 week(s)  The format for your next appointment:   In Person  Provider:   Ermalinda Barrios, PA-C   Other Instructions         [place] [address] Dept: [phone] Loc: [place]  05/26/2021  You are scheduled for a Cardiac Catheterization on Thursday,  July 21 with Dr. Daneen Schick.  1. Please arrive at the [facility] (Main Entrance A) at [facility]: [address] [place], [place] [address] at 5:30 AM (This time is two hours before your procedure to ensure your preparation). Free valet parking service is available.   Special note: Every effort is made to have your procedure done on time. Please understand that emergencies sometimes delay scheduled procedures.  2. Diet: Do not eat solid foods after midnight.  The patient may have clear liquids until 5am upon the day of the procedure.   3. Medication instructions in preparation for your procedure:   Contrast Allergy: No   HOLD LOVENOX DAY OF PROCEDURE- FOLLOW COUMADIN SCHEDULE  DO NOT TAKE OXYCODONE/VALIUM DAY OF PROCEDURE   On the morning of your procedure, take 1 Aspirin 81 mg tablet and any morning medicines NOT listed above.  You may use sips of water.  4. Plan for one night stay--bring personal belongings. 5. Bring a current list of your medications and current insurance cards. 6. You MUST have a responsible person to drive you home. 7. Someone MUST be with you the first 24 hours after you arrive home or your discharge will be delayed. 8. Please wear clothes that are easy to get on and off and wear slip-on shoes.  Thank you for allowing [place] to care for you!   -- [facility]

## 2021-05-26 NOTE — Progress Notes (Signed)
Cardiology Office Note    Date:  05/27/2021   ID:  Heather [name], DOB 1945/12/08, MRN [id number]  PCP:  Doree Albee, MD  Cardiologist: Rozann Lesches, MD    Chief Complaint  Patient presents with   Follow-up    Abnormal Stress Test    History of Present Illness:    Heather [name] is a 75 y.o. female with past medical history of CAD (mild to moderate non-obstructive CAD by cath in 2014 and 09/2018), paroxysmal atrial fibrillation, history of Takotsubo cardiomyopathy, HTN, and HLD (statin intolerant) who presents to the office today for follow-up from her recent abnormal stress test.  She was examined by Ermalinda Barrios, PA-C on 04/24/2021 and reported intermittent episodes of numbness and tension along her left arm, face and shoulder which she felt may be secondary to panic attacks but had tried nitroglycerin without improvement. She did report worsening fatigue and exercise intolerance. An Exercise Myoview and echocardiogram were recommended for further evaluation. Her echocardiogram showed a preserved EF of 55 to 60% with no regional wall motion abnormalities and no significant valve abnormalities. Her exercise treadmill stress test showed baseline 1 mm inferior ST depression which increased to 3 mm horizontal depression with exercise and imaging showed findings consistent with prior septal infarction with mild to moderate peri-infarct ischemia and was a low to intermediate risk study. Findings were reviewed with Dr. Domenic Polite and a cardiac catheterization was recommended for definitive evaluation.  She was evaluated in the ED on 05/15/2021 for numbness along the left side of her body and CT Head showed no acute abnormalities and Brain MRI was negative for acute findings.  In talking with the patient today, she reports a variety of symptoms over the past few months. As mentioned in prior office visits, she has noticed a distinct change in her energy level and also reports dyspnea on exertion.  No reported chest pain. She reports her palpitations have improved since her last office visit but she does have a ZIO monitor as previously ordered but asks if she can wait until after her cath to start wearing it. She continues to experience intermittent episodes of left-sided weakness and does plan to follow-up with her Neurologist.     Past Medical History:  Diagnosis Date   Adrenal adenoma    Allergy    Anemia    Anxiety    CAD (coronary artery disease)    a. 50-60% mid LAD at cardiac catheterization 2014 [facility]) b. non-obstructive CAD by cath in 09/2018   Cataract    Depression    DJD (degenerative joint disease) of cervical spine 09/12/2016   History of cardiomyopathy    History of tobacco use    Hyperlipidemia    Neuromuscular disorder (HCC)    PAF (paroxysmal atrial fibrillation) ([place])    Diagnosis July 2017 - spontaneously resolved   Statin intolerance     Past Surgical History:  Procedure Laterality Date   ABDOMINAL HYSTERECTOMY  1980   endometriosis   CATARACT EXTRACTION Left 11/2018   LEFT HEART CATH AND CORONARY ANGIOGRAPHY N/A 09/25/2018   Procedure: LEFT HEART CATH AND CORONARY ANGIOGRAPHY;  Surgeon: Belva Crome, MD;  Location: [facility];  Service: Cardiovascular;  Laterality: N/A;   TONSILLECTOMY  1954    Current Medications: Outpatient Medications Prior to Visit  Medication Sig Dispense Refill   Biotin 10000 MCG TABS Take 10,000 mcg by mouth daily.      buPROPion (WELLBUTRIN SR) 150 MG 12 hr tablet TAKE  ONE TABLET BY MOUTH TWICE A DAY 180 tablet 1   Cholecalciferol (VITAMIN D3) 125 MCG (5000 UT) TABS Take 15,000 Units by mouth daily.      Coenzyme Q10 100 MG capsule Take 100 mg by mouth daily.      diazepam (VALIUM) 5 MG tablet Take 1 tablet (5 mg total) by mouth daily as needed for anxiety. 30 tablet 0   docusate sodium (COLACE) 100 MG capsule Take 100 mg by mouth 2 (two) times daily.      enoxaparin (LOVENOX) 120 MG/0.8ML injection  Inject 0.8 mLs (120 mg total) into the skin daily. 8 mL 0   estradiol (ESTRACE) 0.5 MG tablet Take 1 tablet (0.5 mg total) by mouth daily. 90 tablet 1   estradiol (ESTRACE) 2 MG tablet Take 1 tablet (2 mg total) by mouth daily. 90 tablet 1   lidocaine (XYLOCAINE) 5 % ointment Apply 1 application topically 3 (three) times daily as needed. 35.44 g 0   nitrofurantoin, macrocrystal-monohydrate, (MACROBID) 100 MG capsule Take 1 capsule (100 mg total) by mouth 2 (two) times daily. 60 capsule 0   nitroGLYCERIN (NITROSTAT) 0.4 MG SL tablet Place 1 tablet under the tongue every 5 minutes as needed for chest pain. 25 tablet 2   NP THYROID 120 MG tablet Take 1 tablet (120 mg total) by mouth daily before breakfast. 90 tablet 1   NP THYROID 90 MG tablet Take 1 tablet (90 mg total) by mouth daily before lunch. 90 tablet 1   oxyCODONE-acetaminophen (PERCOCET/ROXICET) 5-325 MG tablet TAKE 1 TABLET DAILY AS NEEDED FOR SEVERE PAIN. 30 tablet 0   progesterone (PROMETRIUM) 200 MG capsule Take 1 capsule (200 mg total) by mouth daily. 90 capsule 1   Testosterone Propionate (FIRST-TESTOSTERONE MC) 2 % CREA Place 5 mg onto the skin daily. 30 g 0   warfarin (COUMADIN) 5 MG tablet Take 1 to 1 and 1/2 tablets by mouth daily or as directed. Need to call office for INR check 135 tablet 0   No facility-administered medications prior to visit.     Allergies:   Statins and Sulfa antibiotics   Social History   Socioeconomic History   Marital status: Married    Spouse name: Al   Number of children: 0   Years of education: 18   Highest education level: Not on file  Occupational History   Occupation: Probation officer    Comment: from home  Tobacco Use   Smoking status: Former    Packs/day: 0.00    Types: Cigarettes    Start date: 11/12/1965    Quit date: 09/06/2008    Years since quitting: 12.7   Smokeless tobacco: Never  Vaping Use   Vaping Use: Never used  Substance and Sexual Activity   Alcohol use: Yes    Alcohol/week:  1.0 standard drink    Types: 1 Glasses of wine per week    Comment: occ   Drug use: Yes    Types: Marijuana    Comment: rarely   Sexual activity: Yes    Birth control/protection: Post-menopausal  Other Topics Concern   Not on file  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   Lives with husband AL   No children   Healthy diet and lifestyle   Social Determinants of Health   Financial Resource Strain: Not on file  Food Insecurity: Not on file  Transportation Needs: Not on file  Physical Activity: Not on file  Stress: Not on file  Social Connections: Not  on file     Family History:  The patient'sfamily history includes Arthritis in her father and sister; COPD in her mother; Cancer - Prostate in her brother; Hearing loss in her mother; Heart disease in her father, mother, and sister; Hyperlipidemia in her brother; Hypertension in her brother and father; Stroke in her mother.   Review of Systems:    Please see the history of present illness.     All other systems reviewed and are otherwise negative except as noted above.   Physical Exam:    VS:  BP 124/68   Pulse 60   Ht _0  (1.626 m)   Wt 161 lb 1.6 oz (73.1 kg)   LMP 07/08/1979 (Approximate)   SpO2 99%   BMI 27.65 kg/m    General: Well developed, well nourished,female appearing in no acute distress. Head: Normocephalic, atraumatic. Neck: No carotid bruits. JVD not elevated.  Lungs: Respirations regular and unlabored, without wheezes or rales.  Heart: Regular rate and rhythm. No S3 or S4.  No murmur, no rubs, or gallops appreciated. Abdomen: Appears non-distended. No obvious abdominal masses. Msk:  Strength and tone appear normal for age. No obvious joint deformities or effusions. Extremities: No clubbing or cyanosis. No pitting edema.  Distal pedal pulses are 2+ bilaterally. Neuro: Alert and oriented X 3. Moves all extremities spontaneously. No focal deficits noted. Psych:  Responds to questions appropriately  with a normal affect. Skin: No rashes or lesions noted  Wt Readings from Last 3 Encounters:  05/26/21 161 lb 1.6 oz (73.1 kg)  04/27/21 161 lb (73 kg)  04/24/21 161 lb (73 kg)     Studies/Labs Reviewed:   EKG:  EKG is not ordered today. EKG from 05/15/2021 is reviewed and shows NSR, HR 71 with slight ST depression along the inferior leads.   Recent Labs: 06/05/2020: ALT 20 01/04/2021: TSH <0.01 05/15/2021: BUN 20; Creatinine, Ser 0.66; Hemoglobin 12.3; Platelets 229; Potassium 4.0; Sodium 136   Lipid Panel    Component Value Date/Time   CHOL 255 (H) 04/05/2021 1414   TRIG 152 (H) 04/05/2021 1414   HDL 56 04/05/2021 1414   CHOLHDL 4.6 04/05/2021 1414   LDLCALC 169 (H) 04/05/2021 1414    Additional studies/ records that were reviewed today include:   Cardiac Catheterization: 09/2018 Right dominant coronary anatomy Short left main, widely patent Ostial 25% LAD followed by a segmental 30 to 40% region of narrowing.  One large diagonal branch arises from this region and is as large as the left anterior descending which has no significant focal obstruction. Circumflex has proximal irregularity up to 30%.  2 large obtuse marginal branches are free of obstruction. The right coronary artery contains a relatively short segmental 40 to 50% narrowing. Left ventricular function is normal with EF greater than 55%.  LVEDP is normal.   RECOMMENDATIONS:   Aggressive risk prevention: LDL less than 70, blood pressure 130/80 mmHg or less, hemoglobin A1c less than 7, 150 minutes or more of moderate physical activity per week, consider screening for sleep apnea, and smoking cessation if appropriate. Consider aspirin although will be at increased bleeding risk when used with Coumadin therapy.   Echocardiogram: 05/2021 IMPRESSIONS     1. Left ventricular ejection fraction, by estimation, is 55 to 60%. The  left ventricle has normal function. The left ventricle has no regional  wall motion  abnormalities. Left ventricular diastolic parameters are  consistent with Grade I diastolic  dysfunction (impaired relaxation).   2. Right ventricular systolic  function is normal. The right ventricular  size is normal. Tricuspid regurgitation signal is inadequate for assessing  PA pressure.   3. The mitral valve is normal in structure. No evidence of mitral valve  regurgitation. No evidence of mitral stenosis.   4. The aortic valve is tricuspid. Aortic valve regurgitation is not  visualized. No aortic stenosis is present.   5. The inferior vena cava is normal in size with greater than 50%  respiratory variability, suggesting right atrial pressure of 3 mmHg.   NST: 05/2021 Blood pressure demonstrated a normal response to exercise. ST segment depression was noted during stress .Baseline 50m inferior ST depression increased to 320mhorizontal depression with stress Cannot calculate Duke treadmill score as protocol was held at stage II due to dyspnea Findings consistent with prior septal myocardial infarction with mild to moderate peri-infarct ischemia. The left ventricular ejection fraction is normal (55-65%). Low to intermediate risk study  Assessment:    1. Abnormal stress test   2. Coronary artery disease involving native coronary artery of native heart with angina pectoris ([place]  3. PAF (paroxysmal atrial fibrillation) ([place]  4. Hyperlipidemia LDL goal <70   5. Left arm numbness      Plan:   In order of problems listed above:  1. Dyspnea on Exertion/Abnormal Stress Test - Recent NST showed baseline 1 mm inferior ST depression which increased to 3 mm horizontal depression with exercise and imaging showed findings consistent with prior septal infarction with mild to moderate peri-infarct ischemia and was a low to intermediate risk study. Dr. McDomenic Politeid recommend a cardiac catheterization for definitive evaluation. The patient understands that risks include but are not limited to  stroke (1 in 1000), death (1 in 1053 kidney failure [usually temporary] (1 in 500), bleeding (1 in 200), allergic reaction [possibly serious] (1 in 200). Recent labs from 05/15/2021 showed stable hemoglobin and platelets along with normal electrolytes and kidney function. Will arrange for cardiac catheterization next week. She has already met with the Coumadin clinic and is aware of how to utilize Lovenox bridging.  2. CAD - She had mild to moderate non-obstructive CAD by cath in 2014 and 09/2018. Given her recent abnormal stress test, a cardiac catheterization was recommended for definitive evaluation as outlined above.  - She has not been on ASA given the need for anticoagulation. She has not been on a beta-blocker due to baseline bradycardia and has been intolerant to statins and has not been interested in other options as she has preferred dietary changes.   3. Paroxysmal Atrial Fibrillation - She reports her palpitations have improved since her prior visit. She does have a ZIO monitor but has not worn this yet and asks if she can wait until after her catheterization to put this on. I will ask nursing staff to make our ZiSchallerepresentative aware. - She has remained on Coumadin for anticoagulation as DOAC's have been unaffordable given no insurance coverage for them. She is currently undergoing a Lovenox bridge given her upcoming catheterization.   4. HLD - Followed by her PCP. FLP in 03/2021 showed total cholesterol 255, HDL 56, triglycerides 152 and LDL 169.  She has been intolerant to statins in the past and we did discuss possible PCSK9 inhibitor therapy but she is concerned about the cost of this due to no medication coverage with her insurance.  Pending cath results, would recommend referral to the Lipid Clinic to see if she could possibly be considered for clinical trials.  5. Left-Sided Weakness - She did undergo recent CT and MRI which showed no acute findings.  She does follow with Neurology  was encouraged to keep scheduled follow-up.     Medication Adjustments/Labs and Tests Ordered: Current medicines are reviewed at length with the patient today.  Concerns regarding medicines are outlined above.  Medication changes, Labs and Tests ordered today are listed in the Patient Instructions below. Patient Instructions  Medication Instructions:  Your physician recommends that you continue on your current medications as directed. Please refer to the Current Medication list given to you today.  *If you need a refill on your cardiac medications before your next appointment, please call your pharmacy*   Lab Work: None If you have labs (blood work) drawn today and your tests are completely normal, you will receive your results only by: [place] (if you have MyChart) OR A paper copy in the mail If you have any lab test that is abnormal or we need to change your treatment, we will call you to review the results.   Testing/Procedures: None   Follow-Up: At [facility], you and your health needs are our priority.  As part of our continuing mission to provide you with exceptional heart care, we have created designated Provider Care Teams.  These Care Teams include your primary Cardiologist (physician) and Advanced Practice Providers (APPs -  Physician Assistants and Nurse Practitioners) who all work together to provide you with the care you need, when you need it.  We recommend signing up for the patient portal called "MyChart".  Sign up information is provided on this After Visit Summary.  MyChart is used to connect with patients for Virtual Visits (Telemedicine).  Patients are able to view lab/test results, encounter notes, upcoming appointments, etc.  Non-urgent messages can be sent to your provider as well.   To learn more about what you can do with MyChart, go to [email or web address].    Your next appointment:   3-4 week(s)  The format for your next appointment:   In  Person  Provider:   Ermalinda Barrios, PA-C   Other Instructions         [place] [address] Dept: [phone] Loc: [place]  05/26/2021  You are scheduled for a Cardiac Catheterization on Thursday, July 21 with Dr. Daneen Schick.  1. Please arrive at the [facility] (Main Entrance A) at [facility]: [address] [place], [place] [address] at 5:30 AM (This time is two hours before your procedure to ensure your preparation). Free valet parking service is available.   Special note: Every effort is made to have your procedure done on time. Please understand that emergencies sometimes delay scheduled procedures.  2. Diet: Do not eat solid foods after midnight.  The patient may have clear liquids until 5am upon the day of the procedure.   3. Medication instructions in preparation for your procedure:   Contrast Allergy: No   HOLD LOVENOX DAY OF PROCEDURE- FOLLOW COUMADIN SCHEDULE  DO NOT TAKE OXYCODONE/VALIUM DAY OF PROCEDURE   On the morning of your procedure, take 1 Aspirin 81 mg tablet and any morning medicines NOT listed above.  You may use sips of water.  4. Plan for one night stay--bring personal belongings. 5. Bring a current list of your medications and current insurance cards. 6. You MUST have a responsible person to drive you home. 7. Someone MUST be  with you the first 24 hours after you arrive home or your discharge will be delayed. 8. Please wear clothes that are easy to get on and off and wear slip-on shoes.  Thank you for allowing [place] to care for you!   -- [place] [facility]   Signed, Erma Heritage, Hershal Coria  05/27/2021 11:51 AM    [place] [address] [place], [place] [address] Phone: [phone] Fax: [phone]

## 2021-05-29 ENCOUNTER — Other Ambulatory Visit: Payer: Self-pay | Admitting: Cardiology

## 2021-05-29 MED ORDER — WARFARIN SODIUM 5 MG PO TABS: ORAL_TABLET | ORAL | 1 refills | Status: DC

## 2021-05-29 NOTE — Addendum Note (Signed)
Addended by: Malen Gauze on: 05/29/2021 04:42 PM   Modules accepted: Orders

## 2021-05-31 ENCOUNTER — Telehealth: Payer: Self-pay | Admitting: *Deleted

## 2021-05-31 NOTE — H&P (Signed)
Cath x 3. 50 % LAD H/O Takotsubo Could have microvascular disease.

## 2021-05-31 NOTE — Telephone Encounter (Addendum)
Pt contacted pre-catheterization scheduled at [facility] for: Thursday June 01, 2021 7:30 AM Verified arrival time and place: [place] [facility]) at: 5:30 AM   No solid food after midnight prior to cath, clear liquids until 5 AM day of procedure.  Hold: Coumadin-none 05/26/21 until post procedure/Lovenox bridge-see anticoag office note 05/25/21 for details.  Except hold medications AM meds can be  taken pre-cath with sips of water including: aspirin 81 mg   Confirmed patient has responsible adult to drive home post procedure and be with patient first 24 hours after arriving home: yes  You are allowed ONE visitor in the waiting room during the time you are at the hospital for your procedure. Both you and your visitor must wear a mask once you enter the hospital.   Patient reports does not currently have any symptoms concerning for COVID-19 and no household members with COVID-19 like illness.       Reviewed procedure/mask/visitor instructions with patient.

## 2021-06-01 ENCOUNTER — Encounter (HOSPITAL_COMMUNITY)
Admission: RE | Disposition: A | Discharge status: Home / Self Care | Payer: Self-pay | Source: Home / Self Care | Attending: Interventional Cardiology

## 2021-06-01 ENCOUNTER — Ambulatory Visit (HOSPITAL_COMMUNITY)
Admission: RE | Admit: 2021-06-01 | Discharge: 2021-06-01 | Disposition: A | Discharge status: Home / Self Care | Payer: Medicare Other | Attending: Interventional Cardiology | Admitting: Interventional Cardiology

## 2021-06-01 ENCOUNTER — Encounter (HOSPITAL_COMMUNITY): Payer: Self-pay | Admitting: Interventional Cardiology

## 2021-06-01 ENCOUNTER — Other Ambulatory Visit: Payer: Self-pay

## 2021-06-01 DIAGNOSIS — E785 Hyperlipidemia, unspecified: Secondary | ICD-10-CM | POA: Diagnosis not present

## 2021-06-01 DIAGNOSIS — Z8249 Family history of ischemic heart disease and other diseases of the circulatory system: Secondary | ICD-10-CM | POA: Insufficient documentation

## 2021-06-01 DIAGNOSIS — Z87891 Personal history of nicotine dependence: Secondary | ICD-10-CM | POA: Diagnosis not present

## 2021-06-01 DIAGNOSIS — R0609 Other forms of dyspnea: Secondary | ICD-10-CM | POA: Insufficient documentation

## 2021-06-01 DIAGNOSIS — I480 Paroxysmal atrial fibrillation: Secondary | ICD-10-CM | POA: Diagnosis present

## 2021-06-01 DIAGNOSIS — R419 Unspecified symptoms and signs involving cognitive functions and awareness: Secondary | ICD-10-CM | POA: Diagnosis present

## 2021-06-01 DIAGNOSIS — Z888 Allergy status to other drugs, medicaments and biological substances status: Secondary | ICD-10-CM | POA: Insufficient documentation

## 2021-06-01 DIAGNOSIS — Z882 Allergy status to sulfonamides status: Secondary | ICD-10-CM | POA: Diagnosis not present

## 2021-06-01 DIAGNOSIS — R531 Weakness: Secondary | ICD-10-CM | POA: Diagnosis not present

## 2021-06-01 DIAGNOSIS — I2510 Atherosclerotic heart disease of native coronary artery without angina pectoris: Secondary | ICD-10-CM | POA: Diagnosis present

## 2021-06-01 DIAGNOSIS — I5181 Takotsubo syndrome: Secondary | ICD-10-CM | POA: Diagnosis present

## 2021-06-01 HISTORY — PX: CORONARY PRESSURE/FFR STUDY: CATH118243

## 2021-06-01 HISTORY — PX: LEFT HEART CATH AND CORONARY ANGIOGRAPHY: CATH118249

## 2021-06-01 LAB — PROTIME-INR
INR: 0.9 (ref 0.8–1.2)
Prothrombin Time: 12.6 seconds (ref 11.4–15.2)

## 2021-06-01 LAB — POCT ACTIVATED CLOTTING TIME: Activated Clotting Time: 271 seconds

## 2021-06-01 SURGERY — LEFT HEART CATH AND CORONARY ANGIOGRAPHY
Anesthesia: LOCAL

## 2021-06-01 MED ORDER — SODIUM CHLORIDE 0.9 % WEIGHT BASED INFUSION
3.0000 mL/kg/h | INTRAVENOUS | Status: AC
  Administered 2021-06-01: 3 mL/kg/h via INTRAVENOUS

## 2021-06-01 MED ORDER — ASPIRIN 81 MG PO CHEW: 81.0000 mg | CHEWABLE_TABLET | ORAL | Status: DC

## 2021-06-01 MED ORDER — SODIUM CHLORIDE 0.9% FLUSH: 3.0000 mL | Freq: Two times a day (BID) | INTRAVENOUS | Status: DC

## 2021-06-01 MED ORDER — SODIUM CHLORIDE 0.9 % WEIGHT BASED INFUSION
1.0000 mL/kg/h | INTRAVENOUS | Status: DC
  Administered 2021-06-01: 1 mL/kg/h via INTRAVENOUS

## 2021-06-01 MED ORDER — IOHEXOL 350 MG/ML SOLN
INTRAVENOUS | Status: DC | PRN
Start: 2021-06-01 — End: 2021-06-01
  Administered 2021-06-01: 145 mL

## 2021-06-01 MED ORDER — MIDAZOLAM HCL 2 MG/2ML IJ SOLN
INTRAMUSCULAR | Status: AC
  Filled 2021-06-01: qty 2

## 2021-06-01 MED ORDER — VERAPAMIL HCL 2.5 MG/ML IV SOLN
INTRAVENOUS | Status: AC
  Filled 2021-06-01: qty 2

## 2021-06-01 MED ORDER — VERAPAMIL HCL 2.5 MG/ML IV SOLN
INTRAVENOUS | Status: DC | PRN
  Administered 2021-06-01: 10 mL via INTRA_ARTERIAL

## 2021-06-01 MED ORDER — HEPARIN (PORCINE) IN NACL 1000-0.9 UT/500ML-% IV SOLN
INTRAVENOUS | Status: DC | PRN
  Administered 2021-06-01 (×2): 500 mL

## 2021-06-01 MED ORDER — WARFARIN SODIUM 5 MG PO TABS: ORAL_TABLET | ORAL | 1 refills | Status: DC

## 2021-06-01 MED ORDER — HEPARIN SODIUM (PORCINE) 1000 UNIT/ML IJ SOLN
INTRAMUSCULAR | Status: DC | PRN
  Administered 2021-06-01 (×2): 4000 [IU] via INTRAVENOUS

## 2021-06-01 MED ORDER — HEPARIN SODIUM (PORCINE) 1000 UNIT/ML IJ SOLN
INTRAMUSCULAR | Status: AC
  Filled 2021-06-01: qty 1

## 2021-06-01 MED ORDER — FENTANYL CITRATE (PF) 100 MCG/2ML IJ SOLN
INTRAMUSCULAR | Status: AC
  Filled 2021-06-01: qty 2

## 2021-06-01 MED ORDER — LIDOCAINE HCL (PF) 1 % IJ SOLN
INTRAMUSCULAR | Status: DC | PRN
  Administered 2021-06-01 (×2): 2 mL

## 2021-06-01 MED ORDER — SODIUM CHLORIDE 0.9% FLUSH: 3.0000 mL | INTRAVENOUS | Status: DC | PRN

## 2021-06-01 MED ORDER — SODIUM CHLORIDE 0.9 % IV SOLN: 250.0000 mL | INTRAVENOUS | Status: DC | PRN

## 2021-06-01 MED ORDER — ADENOSINE 12 MG/4ML IV SOLN
INTRAVENOUS | Status: AC
  Filled 2021-06-01: qty 16

## 2021-06-01 MED ORDER — ACETAMINOPHEN 325 MG PO TABS: 650.0000 mg | ORAL_TABLET | ORAL | Status: DC | PRN

## 2021-06-01 MED ORDER — SODIUM CHLORIDE 0.9 % IV SOLN: INTRAVENOUS | Status: DC

## 2021-06-01 MED ORDER — LIDOCAINE HCL (PF) 1 % IJ SOLN
INTRAMUSCULAR | Status: AC
  Filled 2021-06-01: qty 30

## 2021-06-01 MED ORDER — FENTANYL CITRATE (PF) 100 MCG/2ML IJ SOLN
INTRAMUSCULAR | Status: DC | PRN
  Administered 2021-06-01 (×3): 25 ug via INTRAVENOUS

## 2021-06-01 MED ORDER — NITROGLYCERIN 1 MG/10 ML FOR IR/CATH LAB
INTRA_ARTERIAL | Status: DC | PRN
Start: 2021-06-01 — End: 2021-06-01
  Administered 2021-06-01: 200 ug via INTRACORONARY

## 2021-06-01 MED ORDER — MIDAZOLAM HCL 2 MG/2ML IJ SOLN
INTRAMUSCULAR | Status: DC | PRN
  Administered 2021-06-01: 0.5 mg via INTRAVENOUS
  Administered 2021-06-01: 1 mg via INTRAVENOUS
  Administered 2021-06-01: 0.5 mg via INTRAVENOUS

## 2021-06-01 MED ORDER — ADENOSINE (DIAGNOSTIC) 140MCG/KG/MIN
INTRAVENOUS | Status: DC | PRN
  Administered 2021-06-01: 140 ug/kg/min via INTRAVENOUS

## 2021-06-01 MED ORDER — ONDANSETRON HCL 4 MG/2ML IJ SOLN: 4.0000 mg | Freq: Four times a day (QID) | INTRAMUSCULAR | Status: DC | PRN

## 2021-06-01 MED ORDER — LABETALOL HCL 5 MG/ML IV SOLN: 10.0000 mg | INTRAVENOUS | Status: DC | PRN

## 2021-06-01 MED ORDER — NITROGLYCERIN 1 MG/10 ML FOR IR/CATH LAB
INTRA_ARTERIAL | Status: AC
  Filled 2021-06-01: qty 10

## 2021-06-01 MED ORDER — HYDRALAZINE HCL 20 MG/ML IJ SOLN: 10.0000 mg | INTRAMUSCULAR | Status: DC | PRN

## 2021-06-01 MED ORDER — HEPARIN (PORCINE) IN NACL 1000-0.9 UT/500ML-% IV SOLN
INTRAVENOUS | Status: AC
  Filled 2021-06-01: qty 1000

## 2021-06-01 SURGICAL SUPPLY — 15 items
CATH 5FR JL3.5 JR4 ANG PIG MP (CATHETERS) ×1 IMPLANT
CATH LAUNCHER 5F EBU3.0 (CATHETERS) IMPLANT
CATHETER LAUNCHER 5F EBU3.0 (CATHETERS) ×2
DEVICE RAD COMP TR BAND LRG (VASCULAR PRODUCTS) ×1 IMPLANT
GLIDESHEATH SLEND A-KIT 6F 22G (SHEATH) ×1 IMPLANT
GUIDEWIRE INQWIRE 1.5J.035X260 (WIRE) IMPLANT
GUIDEWIRE PRESSURE X 175 (WIRE) ×1 IMPLANT
INQWIRE 1.5J .035X260CM (WIRE) ×2
KIT ESSENTIALS PG (KITS) ×1 IMPLANT
KIT HEART LEFT (KITS) ×2 IMPLANT
PACK CARDIAC CATHETERIZATION (CUSTOM PROCEDURE TRAY) ×2 IMPLANT
SHEATH PROBE COVER 6X72 (BAG) ×1 IMPLANT
TRANSDUCER W/STOPCOCK (MISCELLANEOUS) ×2 IMPLANT
TUBING CIL FLEX 10 FLL-RA (TUBING) ×2 IMPLANT
WIRE HI TORQ VERSACORE-J 145CM (WIRE) ×1 IMPLANT

## 2021-06-01 NOTE — CV Procedure (Signed)
Right radial approach. No change in anatomy compared to 2019.  Mid LAD contains 30% narrowing.  Full hemodynamic assessment of the territory which appeared abnormal on the nuclear study with pressure wire X demonstrating an FFR beyond the ostium of the LAD of 0.91 and an IMR (index of myocardial resistance) of 11 with normal less than 25.  This documents normal microcirculatory reserve.  CFR (coronary flow reserve) 5.0. This study demonstrates absence of physiologically significant epicardial disease and also demonstrates normal microvascular function with CFR of 5.0 and IMR of 11.  Therefore, the fixed perfusion defect noted on prior imaging/Myoview suggest residual injury from the patient's episode of Takotsubo in 2014.  Furthermore, patient's current symptoms do not appear to be related to obstructive coronary disease or dysfunctional microvascular disease.  Coronary spasm has not been excluded.  LAD disease has improved over the past 10 years due to lipid management and smoking cessation.

## 2021-06-02 ENCOUNTER — Telehealth: Payer: Self-pay | Admitting: Cardiology

## 2021-06-02 ENCOUNTER — Other Ambulatory Visit (INDEPENDENT_AMBULATORY_CARE_PROVIDER_SITE_OTHER): Payer: Self-pay | Admitting: Internal Medicine

## 2021-06-02 NOTE — Telephone Encounter (Signed)
New message    Patient had her procedure yesterday and they did something extra during her procedure , they put her arm in a splint because they were concerned about bleeding . Should she take the warfarin and the Lovenox ? Or just take the Lovenox today and start the warfarin tomorrow ?

## 2021-06-02 NOTE — Telephone Encounter (Signed)
Returned call to pt, pt had cath done 7/21.  Pt's wrist was splinted post procedure and pt was advised to watch for bleeding.  Pt concerned about bleeding and did not take her Lovenox this am.  Explained that the artery they entered for procedure was what they wanted her to monitor and make sure did not start bleeding again.  Advised pt to resume the Warfarin tonight as instructed, and since she did not take the Lovenox this am, she could resume it in the pm today since she was not instructed to hold the Lovenox by the physician, and just continue taking daily in the pm instead of the am (every 24 hours).  Advised her to continue monitoring for bleeding, but since her procedure was yesterday around 8am it has been over 24 hrs since then and her risk of bleeding post procedure is considerably less, and we want to reduce her risk of clotting/stoke post procedure.  Pt verbalized understanding and will resume Lovenox and Warfarin this pm, and continue to monitor procedure entry point.

## 2021-06-08 ENCOUNTER — Ambulatory Visit (INDEPENDENT_AMBULATORY_CARE_PROVIDER_SITE_OTHER): Payer: Medicare Other | Admitting: *Deleted

## 2021-06-08 ENCOUNTER — Other Ambulatory Visit: Payer: Self-pay

## 2021-06-08 DIAGNOSIS — Z5181 Encounter for therapeutic drug level monitoring: Secondary | ICD-10-CM | POA: Diagnosis not present

## 2021-06-08 DIAGNOSIS — I480 Paroxysmal atrial fibrillation: Secondary | ICD-10-CM | POA: Diagnosis not present

## 2021-06-08 LAB — POCT INR: INR: 1.7 — AB (ref 2.0–3.0)

## 2021-06-08 NOTE — Patient Instructions (Signed)
S/P cardiac cath 7/20  Warfarin was held x 5 days and bridged with Lovenox.  Took last dose of Lovenox last night. Take warfarin '10mg'$  tonight and tomorrow night and 7.'5mg'$  Saturday and Sunday.  Recheck INR on Monday. Stop Lovenox

## 2021-06-11 DIAGNOSIS — R002 Palpitations: Secondary | ICD-10-CM | POA: Diagnosis not present

## 2021-06-12 ENCOUNTER — Other Ambulatory Visit: Payer: Self-pay

## 2021-06-12 ENCOUNTER — Ambulatory Visit (INDEPENDENT_AMBULATORY_CARE_PROVIDER_SITE_OTHER): Payer: Medicare Other | Admitting: *Deleted

## 2021-06-12 DIAGNOSIS — I480 Paroxysmal atrial fibrillation: Secondary | ICD-10-CM

## 2021-06-12 DIAGNOSIS — Z5181 Encounter for therapeutic drug level monitoring: Secondary | ICD-10-CM | POA: Diagnosis not present

## 2021-06-12 LAB — POCT INR: INR: 3.4 — AB (ref 2.0–3.0)

## 2021-06-12 NOTE — Patient Instructions (Signed)
Description   Hold warfarin today and then continue taking warfarin 1.5 tablets daily except for 1 tablet on Sunday and Thursdays. Recheck INR in 1 week.

## 2021-06-13 ENCOUNTER — Ambulatory Visit (INDEPENDENT_AMBULATORY_CARE_PROVIDER_SITE_OTHER): Payer: Medicare Other | Admitting: Neurology

## 2021-06-13 ENCOUNTER — Encounter: Payer: Self-pay | Admitting: Neurology

## 2021-06-13 VITALS — BP 108/68 | HR 55 | Ht 64.0 in | Wt 155.0 lb

## 2021-06-13 DIAGNOSIS — R439 Unspecified disturbances of smell and taste: Secondary | ICD-10-CM | POA: Diagnosis not present

## 2021-06-13 DIAGNOSIS — R202 Paresthesia of skin: Secondary | ICD-10-CM

## 2021-06-13 DIAGNOSIS — R419 Unspecified symptoms and signs involving cognitive functions and awareness: Secondary | ICD-10-CM | POA: Diagnosis not present

## 2021-06-13 DIAGNOSIS — R200 Anesthesia of skin: Secondary | ICD-10-CM | POA: Diagnosis not present

## 2021-06-13 DIAGNOSIS — I25119 Atherosclerotic heart disease of native coronary artery with unspecified angina pectoris: Secondary | ICD-10-CM | POA: Diagnosis not present

## 2021-06-13 DIAGNOSIS — G8929 Other chronic pain: Secondary | ICD-10-CM | POA: Diagnosis not present

## 2021-06-13 DIAGNOSIS — M546 Pain in thoracic spine: Secondary | ICD-10-CM

## 2021-06-13 DIAGNOSIS — I480 Paroxysmal atrial fibrillation: Secondary | ICD-10-CM | POA: Diagnosis not present

## 2021-06-13 HISTORY — DX: Unspecified disturbances of smell and taste: R43.9

## 2021-06-13 NOTE — Progress Notes (Signed)
[facility]  PATIENT: Heather [name] DOB: 28-Mar-1946  REFERRING DOCTOR OR PCP:  Blanchie Serve SOURCE: patient  _________________________________   HISTORICAL  CHIEF COMPLAINT:  Chief Complaint  Patient presents with   Consult    Rm 1, alone. Here left side weakness/ numbness and tingling, started a couple months, and extremely tired. Pt states being breathless. Smells tobacco and vinegar, constantly. pinching on left side. Fullness in throat on L side. Brain fog. Change in mood and feeling depressed.     HISTORY OF PRESENT ILLNESS:  Heather [name] is a 75 y.o. woman who has had chronic right-sided mid back pain since 2013.      Update 06/13/2021 She is a 75 year old woman who I have been seen for myofascial pain in the thoracic region for the past several years.    For the past 2 months, she has had left-sided weakness, numbness and tingling.  Symptoms build up over a few days.    She get some electric sock sensations in her  left leg.   She feels gait is off.     She also reports more fatigue.  Additionally, she notes a cognitive fog and changes in mood with more depression.   Se has had some palpitations, especially at night.  She sometimes smells vinegar or tobacco.    She also has noted  fullness in her left throat.      Besides fatigue, she is also noticing more apathy.   She has had some crying spells.     She is on buproprion XL.   She does not feel more depressed.  MRI brain 7/4/2-22 was personally reviewed.  It shows mild generalized atrophy.  No acute findings.     She had Covid-01 December 2019 and did well.  He had tick bite x 2 in 2020 and 2021 but   She is on coumadin for atrial fibrillation.  Because of the palpitations, she saw cardiology and had several tests.    Cardiac cath showed mild CAD.     Echo was normal .  She is wearing a Zio patch.     She notes more for mental fog recently.  She had been worried about STM some of the past.   She scored  28/30 on the [facility] cognitive assessment in 2020.  She has recently written a book .    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     MRI results from 10/14/2013: The MRI of the brain showed age related atrophy and minimal chronic microvascular ischemic change. MRI of the cervical spine showed multilevel mild degenerative changes with left paramedian disc herniation at C3-C4 and left disc protrusion at C4-C5 and C5-C6 and midline disc herniation at C6-C7. There was no report of nerve root compression. MRI of the thoracic spine showed disc desiccation but no herniation or protrusions. MRI of the lumbar spine showed disc bulges at T12-L1 and L2-L3 and disc bulge with facet hypertrophy at L3-L4 and disc bulge with right foraminal annular tear at L4-L5.  MRI brain 05/15/2021 shows mild generalized cortical atrophy.  The brain parenchyma appears normal.  There are no acute findings.  MRI of the cervical spine 07/05/2020 showed a normal spinal cord.  There was minimal degenerative changes at C3-C4, C4-C5 and C5-C6 but no nerve root compression.  No spinal stenosis.  MRI of the thoracic spine 07/05/2020 showed normal spinal cord and no significant degenerative changes.   REVIEW OF SYSTEMS: Constitutional: No fevers, chills,  sweats, or change in appetite Eyes: No visual changes, double vision, eye pain Ear, nose and throat: No hearing loss, ear pain, nasal congestion, sore throat Cardiovascular: No chest pain, palpitations.   She has atrial fibrillation and is on warfarin Respiratory:  No shortness of breath at rest or with exertion.   No wheezes GastrointestinaI: No nausea, vomiting, diarrhea, abdominal pain, fecal incontinence Genitourinary:  No dysuria, urinary retention or frequency.  No nocturia. Musculoskeletal:  No neck pain.  She has back pain/thoracic pain as above Integumentary: No rash, pruritus, skin lesions Neurological: as above Psychiatric: No depression at this time.  No  anxiety Endocrine: No palpitations, diaphoresis, change in appetite, change in weigh or increased thirst Hematologic/Lymphatic:  No anemia, purpura, petechiae. Allergic/Immunologic: No itchy/runny eyes, nasal congestion, recent allergic reactions, rashes  ALLERGIES: Allergies  Allergen Reactions   Statins Other (See Comments)    Intolerant all statins   Sulfa Antibiotics Rash    HOME MEDICATIONS:  Current Outpatient Medications:    Biotin 10000 MCG TABS, Take 10,000 mcg by mouth daily at 12 noon., Disp: , Rfl:    buPROPion (WELLBUTRIN SR) 150 MG 12 hr tablet, TAKE ONE TABLET BY MOUTH TWICE A DAY, Disp: 180 tablet, Rfl: 1   Cholecalciferol (VITAMIN D3) 125 MCG (5000 UT) TABS, Take 15,000 Units by mouth daily at 12 noon., Disp: , Rfl:    Coenzyme Q10 100 MG capsule, Take 100 mg by mouth daily at 12 noon., Disp: , Rfl:    diazepam (VALIUM) 5 MG tablet, Take 1 tablet (5 mg total) by mouth daily as needed for anxiety., Disp: 30 tablet, Rfl: 0   docusate sodium (COLACE) 100 MG capsule, Take 100 mg by mouth 2 (two) times daily. , Disp: , Rfl:    estradiol (ESTRACE) 0.5 MG tablet, Take 1 tablet (0.5 mg total) by mouth daily., Disp: 90 tablet, Rfl: 1   estradiol (ESTRACE) 2 MG tablet, Take 1 tablet (2 mg total) by mouth daily., Disp: 90 tablet, Rfl: 1   nitrofurantoin, macrocrystal-monohydrate, (MACROBID) 100 MG capsule, Take 1 capsule (100 mg total) by mouth 2 (two) times daily. (Patient taking differently: Take 100 mg by mouth as needed.), Disp: 60 capsule, Rfl: 0   nitroGLYCERIN (NITROSTAT) 0.4 MG SL tablet, Place 1 tablet under the tongue every 5 minutes as needed for chest pain., Disp: 25 tablet, Rfl: 2   NP THYROID 120 MG tablet, Take 1 tablet (120 mg total) by mouth daily before breakfast., Disp: 90 tablet, Rfl: 1   NP THYROID 90 MG tablet, Take 1 tablet (90 mg total) by mouth daily before lunch., Disp: 90 tablet, Rfl: 1   oxyCODONE-acetaminophen (PERCOCET/ROXICET) 5-325 MG tablet, TAKE 1  TABLET DAILY AS NEEDED FOR SEVERE PAIN., Disp: 30 tablet, Rfl: 0   progesterone (PROMETRIUM) 200 MG capsule, Take 1 capsule (200 mg total) by mouth daily., Disp: 90 capsule, Rfl: 1   Testosterone Propionate (FIRST-TESTOSTERONE MC) 2 % CREA, Place 5 mg onto the skin daily., Disp: 30 g, Rfl: 0   warfarin (COUMADIN) 5 MG tablet, Take one to one and one-half tablets by mouth daily or as directed. Resume warfarin today. Following bridging instructions if any., Disp: 135 tablet, Rfl: 1  PAST MEDICAL HISTORY: Past Medical History:  Diagnosis Date   Adrenal adenoma    Allergy    Anemia    Anxiety    CAD (coronary artery disease)    a. 50-60% mid LAD at cardiac catheterization 2014 [facility]) b. non-obstructive CAD by cath in  09/2018   Cataract    Depression    DJD (degenerative joint disease) of cervical spine 09/12/2016   History of cardiomyopathy    History of tobacco use    Hyperlipidemia    Neuromuscular disorder (HCC)    PAF (paroxysmal atrial fibrillation) ([place])    Diagnosis July 2017 - spontaneously resolved   Statin intolerance     PAST SURGICAL HISTORY: Past Surgical History:  Procedure Laterality Date   ABDOMINAL HYSTERECTOMY  1980   endometriosis   CATARACT EXTRACTION Left 11/2018   INTRAVASCULAR PRESSURE WIRE/FFR STUDY N/A 06/01/2021   Procedure: INTRAVASCULAR PRESSURE WIRE/FFR STUDY;  Surgeon: Belva Crome, MD;  Location: [place] [facility];  Service: Cardiovascular;  Laterality: N/A;   LEFT HEART CATH AND CORONARY ANGIOGRAPHY N/A 09/25/2018   Procedure: LEFT HEART CATH AND CORONARY ANGIOGRAPHY;  Surgeon: Belva Crome, MD;  Location: [place] [facility];  Service: Cardiovascular;  Laterality: N/A;   LEFT HEART CATH AND CORONARY ANGIOGRAPHY N/A 06/01/2021   Procedure: LEFT HEART CATH AND CORONARY ANGIOGRAPHY;  Surgeon: Belva Crome, MD;  Location: [place] [facility];  Service: Cardiovascular;  Laterality: N/A;   TONSILLECTOMY  1954    FAMILY HISTORY: Family  History  Problem Relation Age of Onset   COPD Mother    Hearing loss Mother    Stroke Mother    Heart disease Mother        chf, died at 74   Arthritis Father    Hypertension Father    Heart disease Father        cad, pvd, died at 94   Heart disease Sister        heart valve   Arthritis Sister    Hyperlipidemia Brother    Hypertension Brother    Cancer - Prostate Brother     SOCIAL HISTORY:  Social History   Socioeconomic History   Marital status: Married    Spouse name: Al   Number of children: 0   Years of education: 18   Highest education level: Not on file  Occupational History   Occupation: Probation officer    Comment: from home  Tobacco Use   Smoking status: Former    Packs/day: 0.00    Types: Cigarettes    Start date: 11/12/1965    Quit date: 09/06/2008    Years since quitting: 12.7   Smokeless tobacco: Never  Vaping Use   Vaping Use: Never used  Substance and Sexual Activity   Alcohol use: Yes    Alcohol/week: 1.0 standard drink    Types: 1 Glasses of wine per week    Comment: occ   Drug use: Yes    Types: Marijuana    Comment: rarely   Sexual activity: Yes    Birth control/protection: Post-menopausal  Other Topics Concern   Not on file  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   General: The patient is well-developed and well-nourished and in no acute distress.  Head is normocephalic and atraumatic..  Pulse is regular.  Carotid artery sounds normal.   Normal heart sounds.   Musculoskeletal :    There is no tenderness on the left or in the neck or lower back.  Range of motion is normal in the spine.  Neurologic Exam  Mental status: The patient is alert and oriented x 3 at the time of the examination.  She appears to have intact memory focus and attention seem normal.   Speech is normal.  Cranial nerves: Extraocular muscles are intact.  Facial strength is normal.   . No obvious hearing deficits are noted.  Motor: She has normal muscle tone, muscle bulk and muscle strength in the arms or legs.  Sensation: Intact sensation to vibration in legs but reduced on the left arm.   Decreased left leg temperature but +/- intact touch in legs but reduced touch and temperature in left arm  gait and station: Gait and station are normal.  Reflexes: Deep tendon reflexes were normal and symmetric.     ASSESSMENT AND PLAN  1. Numbness and tingling   2. Smell disturbance   3. Cognitive complaints   4. Chronic right-sided thoracic back pain   5. PAF (paroxysmal atrial fibrillation) (HCC)       1.  Etiology of symptoms not clear.   We will check labs for autoimmune disorders and check NCV/EMG. 2.   Shw was scheduled to come in next week for Botox/xeomin for myofascial disorder.   We will try to do both injections of the same day as NCV/EMG 3.   Consider adding duloxetine as it may help sensory symptoms as well as possible worsening of her depression.   4.   She will return in 3-4 months for next Botox or sooner if  new or worsening neurologic symptoms.   45-minute office visit with the majority of the time spent face-to-face for history and physical, discussion/counseling and decision-making.  Additional time with record review and documentation.  Heather Riordan A. Felecia Shelling, MD, PhD [id number], [id number] PM Certified in Neurology, Clinical Neurophysiology, Sleep Medicine, Pain Medicine and Neuroimaging  [facility] [address] [place], [place] [address] [phone]

## 2021-06-14 LAB — SEDIMENTATION RATE: Sed Rate: 22 mm/hr (ref 0–40)

## 2021-06-14 LAB — LYME DISEASE SEROLOGY W/REFLEX: Lyme Total Antibody EIA: NEGATIVE

## 2021-06-14 LAB — RHEUMATOID FACTOR: Rheumatoid fact SerPl-aCnc: <10 IU/mL (ref <14.0)

## 2021-06-14 LAB — ANA W/REFLEX: Anti Nuclear Antibody (ANA): NEGATIVE

## 2021-06-14 LAB — C-REACTIVE PROTEIN: CRP: 1 mg/L (ref 0–10)

## 2021-06-14 NOTE — Progress Notes (Signed)
Cardiology Office Note    Date:  06/26/2021   ID:  [name], DOB January 03, 1946, MRN [id number]   PCP:  Heather Albee, MD (Inactive)   [place]  Cardiologist:  Heather Lesches, MD   Advanced Practice Provider:  No care team member to display Electrophysiologist:  None   [phone]   Chief Complaint  Patient presents with   Follow-up     History of Present Illness:  Heather [name] is a 75 y.o. female with history of nonobstructive CAD on cath 2019, 25% ostial LAD, 30 to 40% LAD 30% circumflex and 40 to 50% RCA, hyperlipidemia with statin intolerance previously on Repatha but stopped and focusing on diet and hormone supplementation, PAF 05/2016 on Coumadin, history of Takotsubo cardiomyopathy.  I saw the patient 04/24/2021 with some numbness and tension in her left arm, face, leg, and shoulder initially thought was panic attacks eased with Valium but she tried nitroglycerin with improvement in symptoms.  She was worried her blockages had worsened.  NST 05/22/2021 she had ST segment depression during stress baseline 1 mm inferior ST depression increased to 3 mm horizontal depression.  Findings were consistent with prior septal MI with mild to moderate peri-infarct ischemia and cardiac catheterization was recommended.  Cath on 05/23/2021 widely patent left main, 20% ostial LAD 25 to 30% mid LAD circumflex widely patent RCA widely patent EF 55%.  Suspect abnormal perfusion on technetium imaging with scar from Takotsubo 10 years ago.  Could not rule out coronary spasm but was not tested.  Look for noncardiac sources of symptoms.  Zio monitor NSR rare PAC's, couplets, triplets less than 1% total, rare PVC's.  Patient comes in for f/u. Still has breathlessness and tightening in her neck and chest when she bends over. Wondering if she has GERD. Would like referred to GI-Dr. Kenton [name].       Past Medical History:  Diagnosis Date   Adrenal adenoma    Allergy     Anemia    Anxiety    CAD (coronary artery disease)    a. 50-60% mid LAD at cardiac catheterization 2014 [facility]) b. non-obstructive CAD by cath in 09/2018   Cataract    Depression    DJD (degenerative joint disease) of cervical spine 09/12/2016   History of cardiomyopathy    History of tobacco use    Hyperlipidemia    Neuromuscular disorder (HCC)    PAF (paroxysmal atrial fibrillation) ([place])    Diagnosis July 2017 - spontaneously resolved   Statin intolerance     Past Surgical History:  Procedure Laterality Date   ABDOMINAL HYSTERECTOMY  1980   endometriosis   CATARACT EXTRACTION Left 11/2018   INTRAVASCULAR PRESSURE WIRE/FFR STUDY N/A 06/01/2021   Procedure: INTRAVASCULAR PRESSURE WIRE/FFR STUDY;  Surgeon: Belva Crome, MD;  Location: [place] [facility];  Service: Cardiovascular;  Laterality: N/A;   LEFT HEART CATH AND CORONARY ANGIOGRAPHY N/A 09/25/2018   Procedure: LEFT HEART CATH AND CORONARY ANGIOGRAPHY;  Surgeon: Belva Crome, MD;  Location: [place] [facility];  Service: Cardiovascular;  Laterality: N/A;   LEFT HEART CATH AND CORONARY ANGIOGRAPHY N/A 06/01/2021   Procedure: LEFT HEART CATH AND CORONARY ANGIOGRAPHY;  Surgeon: Belva Crome, MD;  Location: [place] [facility];  Service: Cardiovascular;  Laterality: N/A;   TONSILLECTOMY  1954    Current Medications: Current Meds  Medication Sig   Biotin 10000 MCG TABS Take 10,000 mcg by mouth daily at 12 noon.  buPROPion (WELLBUTRIN SR) 150 MG 12 hr tablet TAKE ONE TABLET BY MOUTH TWICE A DAY   Cholecalciferol (VITAMIN D3) 125 MCG (5000 UT) TABS Take 15,000 Units by mouth daily at 12 noon.   Coenzyme Q10 100 MG capsule Take 100 mg by mouth daily at 12 noon.   diazepam (VALIUM) 5 MG tablet Take 1 tablet (5 mg total) by mouth daily as needed for anxiety.   docusate sodium (COLACE) 100 MG capsule Take 100 mg by mouth 2 (two) times daily.    estradiol (ESTRACE) 0.5 MG tablet Take 1 tablet (0.5 mg total) by mouth  daily.   estradiol (ESTRACE) 2 MG tablet Take 1 tablet (2 mg total) by mouth daily.   nitrofurantoin, macrocrystal-monohydrate, (MACROBID) 100 MG capsule Take 1 capsule (100 mg total) by mouth 2 (two) times daily. (Patient taking differently: Take 100 mg by mouth as needed.)   nitroGLYCERIN (NITROSTAT) 0.4 MG SL tablet Place 1 tablet under the tongue every 5 minutes as needed for chest pain.   NP THYROID 120 MG tablet Take 1 tablet (120 mg total) by mouth daily before breakfast.   NP THYROID 90 MG tablet Take 1 tablet (90 mg total) by mouth daily before lunch.   oxyCODONE-acetaminophen (PERCOCET/ROXICET) 5-325 MG tablet TAKE 1 TABLET DAILY AS NEEDED FOR SEVERE PAIN.   progesterone (PROMETRIUM) 200 MG capsule Take 1 capsule (200 mg total) by mouth daily.   Testosterone Propionate (FIRST-TESTOSTERONE MC) 2 % CREA Place 5 mg onto the skin daily.   warfarin (COUMADIN) 5 MG tablet Take one to one and one-half tablets by mouth daily or as directed. Resume warfarin today. Following bridging instructions if any.     Allergies:   Statins and Sulfa antibiotics   Social History   Socioeconomic History   Marital status: Married    Spouse name: Al   Number of children: 0   Years of education: 18   Highest education level: Not on file  Occupational History   Occupation: Probation officer    Comment: from home  Tobacco Use   Smoking status: Former    Packs/day: 0.00    Types: Cigarettes    Start date: 11/12/1965    Quit date: 09/06/2008    Years since quitting: 12.8   Smokeless tobacco: Never  Vaping Use   Vaping Use: Never used  Substance and Sexual Activity   Alcohol use: Yes    Alcohol/week: 1.0 standard drink    Types: 1 Glasses of wine per week    Comment: occ   Drug use: Yes    Types: Marijuana    Comment: rarely   Sexual activity: Yes    Birth control/protection: Post-menopausal  Other Topics Concern   Not on file  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   Lives with husband AL   No  children   Healthy diet and lifestyle   Social Determinants of Health   Financial Resource Strain: Not on file  Food Insecurity: Not on file  Transportation Needs: Not on file  Physical Activity: Not on file  Stress: Not on file  Social Connections: Not on file     Family History:  The patient's  family history includes Arthritis in her father and sister; COPD in her mother; Cancer - Prostate in her brother; Hearing loss in her mother; Heart disease in her father, mother, and sister; Hyperlipidemia in her brother; Hypertension in her brother and father; Stroke in her mother.   ROS:   Please see the  history of present illness.    ROS All other systems reviewed and are negative.   PHYSICAL EXAM:   VS:  BP 120/62   Pulse 91   Ht '5\' 4"'$  (1.626 m)   Wt 158 lb (71.7 kg)   LMP 07/08/1979 (Approximate)   SpO2 100%   BMI 27.12 kg/m   Physical Exam  GEN: Well nourished, well developed, in no acute distress  Neck: no JVD, carotid bruits, or masses Cardiac:RRR; no murmurs, rubs, or gallops  Respiratory:  clear to auscultation bilaterally, normal work of breathing GI: soft, nontender, nondistended, + BS Ext: right arm at cath site without hematoma or hemorrhage, good radial and brachial pulses, lower extremity without cyanosis, clubbing, or edema, Good distal pulses bilaterally Neuro:  Alert and Oriented x 3 Psych: euthymic mood, full affect  Wt Readings from Last 3 Encounters:  06/26/21 158 lb (71.7 kg)  06/13/21 155 lb (70.3 kg)  06/01/21 160 lb (72.6 kg)      Studies/Labs Reviewed:   EKG:  EKG is not ordered today.   Recent Labs: 01/04/2021: TSH <0.01 05/15/2021: BUN 20; Creatinine, Ser 0.66; Hemoglobin 12.3; Platelets 229; Potassium 4.0; Sodium 136   Lipid Panel    Component Value Date/Time   CHOL 255 (H) 04/05/2021 1414   TRIG 152 (H) 04/05/2021 1414   HDL 56 04/05/2021 1414   CHOLHDL 4.6 04/05/2021 1414   LDLCALC 169 (H) 04/05/2021 1414    Additional studies/  records that were reviewed today include:   Zio monitor 06/21/21 reviewed with patient Study Highlights  ZIO XT reviewed.  3 days, 13 hours analyzed.  Predominant rhythm is sinus with prolonged PR interval.  Heart rate ranged from 49 bpm up to 99 bpm with average heart rate 62 bpm.  There were rare PACs including couplets and triplets representing less than 1% total beats.  There were rare PVCs representing less than 1% total beats.  No significant arrhythmias or pauses Cardiac cath 06/01/2021 Left main widely patent LAD with 20% ostial narrowing and mid vessel 25 to 30% narrowing.  A diagonal arises from the mid vessel and supplies more territory than LAD which stops before the LV apex. Circumflex gives several small branches and is widely patent Right coronary is dominant and widely patent LVEDP 12 mmHg.  EF 55%. Invasive hemodynamic coronary assessment revealed: CFR 5.0 (normal greater than 2.0); FFR in LAD territory 0.92 (normal greater than 0.8); and IMR (index of microvascular resistance) 11 (normal less than 25).   RECOMMENDATIONS:   Suspect the abnormal perfusion on technetium imaging represents residual scar from episode of Takotsubo 10 years ago. Unable to identify any significant coronary flow abnormalities.  Have still not excluded the possibility of coronary spasm which was not tested for today. This is now her third catheterization with essentially widely patent coronaries.  No microvascular dysfunction has been identified.  The stenoses in the LAD are not hemodynamically significant.  Attention should be turned to other explanations for the atypical dyspnea and arm and leg numbness that she complains of.   NST 05/22/2021 Blood pressure demonstrated a normal response to exercise. ST segment depression was noted during stress .Baseline 22m inferior ST depression increased to 373mhorizontal depression with stress Cannot calculate Duke treadmill score as protocol was held at stage II  due to dyspnea Findings consistent with prior septal myocardial infarction with mild to moderate peri-infarct ischemia. The left ventricular ejection fraction is normal (55-65%). Low to intermediate risk study    Risk Assessment/Calculations:  CHA2DS2-VASc Score = 4  This indicates a 4.8% annual risk of stroke. The patient's score is based upon: CHF History: No HTN History: No Diabetes History: No Stroke History: No Vascular Disease History: Yes Age Score: 2 Gender Score: 1        ASSESSMENT:    1. PAF (paroxysmal atrial fibrillation) ([place])   2. Coronary artery disease involving native coronary artery of native heart with angina pectoris ([place])   3. Hyperlipidemia LDL goal <70   4. Non-cardiac chest pain      PLAN:  In order of problems listed above:   Paroxysmal atrial fibrillation.  CHA2DS2-VASc score is 3-4.  She continues on Coumadin, recent Zio without arrhythmia.      Mild  nonobstructive CAD by cardiac catheterization in 05/2021 after abnormal NST.   She has a history of statin intolerance and does not want to take any specific medical therapy including Repatha.  We continue to discuss this over time.Continues to have neck pain and shortness of breath when bending over. Asking for referral to GI.  Hyperlipidemia with statin intolerance and stopped Repatha was being treated by Dr. Anastasio Champion with hormone supplementation and diet but LDL was 169 down from 209. Looking for someone to continue hormone treatment  Shared Decision Making/Informed Consent          Medication Adjustments/Labs and Tests Ordered: Current medicines are reviewed at length with the patient today.  Concerns regarding medicines are outlined above.  Medication changes, Labs and Tests ordered today are listed in the Patient Instructions below. Patient Instructions  Medication Instructions:  Your physician recommends that you continue on your current medications as directed. Please refer to the  Current Medication list given to you today.  *If you need a refill on your cardiac medications before your next appointment, please call your pharmacy*   Lab Work: None If you have labs (blood work) drawn today and your tests are completely normal, you will receive your results only by: [place] (if you have MyChart) OR A paper copy in the mail If you have any lab test that is abnormal or we need to change your treatment, we will call you to review the results.   Testing/Procedures: None   Follow-Up: At [facility], you and your health needs are our priority.  As part of our continuing mission to provide you with exceptional heart care, we have created designated Provider Care Teams.  These Care Teams include your primary Cardiologist (physician) and Advanced Practice Providers (APPs -  Physician Assistants and Nurse Practitioners) who all work together to provide you with the care you need, when you need it.  We recommend signing up for the patient portal called "MyChart".  Sign up information is provided on this After Visit Summary.  MyChart is used to connect with patients for Virtual Visits (Telemedicine).  Patients are able to view lab/test results, encounter notes, upcoming appointments, etc.  Non-urgent messages can be sent to your provider as well.   To learn more about what you can do with MyChart, go to [email or web address].    Your next appointment:   1 year(s)  The format for your next appointment:   In Person  Provider:   Rozann Lesches, MD   Other Instructions You have been referred to GI. Please allow them to contact you for your first appt.    Sumner Boast, PA-C  06/26/2021 2:00 PM    [place], [name]  [address] Phone: [phone]; Fax: [phone]

## 2021-06-16 NOTE — H&P (View-Only) (Signed)
The patient has been seen in conjunction with Bernerd Pho, NP-C. All aspects of care have been considered and discussed. The patient has been personally interviewed, examined, and all clinical data has been reviewed.  Agree with attached H and P Cath x 3. 50 % LAD H/O Takotsubo Could have microvascular disease.    Cath Lab Visit (complete for each Cath Lab visit)  Clinical Evaluation Leading to the Procedure:   ACS: No.  Non-ACS:    Anginal Classification: CCS III  Anti-ischemic medical therapy: Minimal Therapy (1 class of medications)  Non-Invasive Test Results: No non-invasive testing performed  Prior CABG: No previous CABG         Cardiology Office Note    Date:  06/16/2021   ID:  Heather [name], DOB 08-29-1946, MRN [id number]  PCP:  Doree Albee, MD  Cardiologist: Rozann Lesches, MD    No chief complaint on file.   History of Present Illness:    Heather [name] is a 75 y.o. female with past medical history of CAD (mild to moderate non-obstructive CAD by cath in 2014 and 09/2018), paroxysmal atrial fibrillation, history of Takotsubo cardiomyopathy, HTN, and HLD (statin intolerant) who presents to the office today for follow-up from her recent abnormal stress test.  She was examined by Ermalinda Barrios, PA-C on 04/24/2021 and reported intermittent episodes of numbness and tension along her left arm, face and shoulder which she felt may be secondary to panic attacks but had tried nitroglycerin without improvement. She did report worsening fatigue and exercise intolerance. An Exercise Myoview and echocardiogram were recommended for further evaluation. Her echocardiogram showed a preserved EF of 55 to 60% with no regional wall motion abnormalities and no significant valve abnormalities. Her exercise treadmill stress test showed baseline 1 mm inferior ST depression which increased to 3 mm horizontal depression with exercise and imaging showed findings consistent with  prior septal infarction with mild to moderate peri-infarct ischemia and was a low to intermediate risk study. Findings were reviewed with Dr. Domenic Polite and a cardiac catheterization was recommended for definitive evaluation.  She was evaluated in the ED on 05/15/2021 for numbness along the left side of her body and CT Head showed no acute abnormalities and Brain MRI was negative for acute findings.  In talking with the patient today, she reports a variety of symptoms over the past few months. As mentioned in prior office visits, she has noticed a distinct change in her energy level and also reports dyspnea on exertion. No reported chest pain. She reports her palpitations have improved since her last office visit but she does have a ZIO monitor as previously ordered but asks if she can wait until after her cath to start wearing it. She continues to experience intermittent episodes of left-sided weakness and does plan to follow-up with her Neurologist.     Past Medical History:  Diagnosis Date   Adrenal adenoma    Allergy    Anemia    Anxiety    CAD (coronary artery disease)    a. 50-60% mid LAD at cardiac catheterization 2014 [facility]) b. non-obstructive CAD by cath in 09/2018   Cataract    Depression    DJD (degenerative joint disease) of cervical spine 09/12/2016   History of cardiomyopathy    History of tobacco use    Hyperlipidemia    Neuromuscular disorder ([place])    PAF (paroxysmal atrial fibrillation) ([place])    Diagnosis July 2017 - spontaneously resolved   Statin intolerance  Past Surgical History:  Procedure Laterality Date   ABDOMINAL HYSTERECTOMY  1980   endometriosis   CATARACT EXTRACTION Left 11/2018   INTRAVASCULAR PRESSURE WIRE/FFR STUDY N/A 06/01/2021   Procedure: INTRAVASCULAR PRESSURE WIRE/FFR STUDY;  Surgeon: Belva Crome, MD;  Location: [facility];  Service: Cardiovascular;  Laterality: N/A;   LEFT HEART CATH AND CORONARY ANGIOGRAPHY N/A 09/25/2018    Procedure: LEFT HEART CATH AND CORONARY ANGIOGRAPHY;  Surgeon: Belva Crome, MD;  Location: [place];  Service: Cardiovascular;  Laterality: N/A;   LEFT HEART CATH AND CORONARY ANGIOGRAPHY N/A 06/01/2021   Procedure: LEFT HEART CATH AND CORONARY ANGIOGRAPHY;  Surgeon: Belva Crome, MD;  Location: [place] [facility];  Service: Cardiovascular;  Laterality: N/A;   TONSILLECTOMY  1954    Current Medications: (Not in an outpatient encounter)    Allergies:   Statins and Sulfa antibiotics   Social History   Socioeconomic History   Marital status: Married    Spouse name: Al   Number of children: 0   Years of education: 18   Highest education level: Not on file  Occupational History   Occupation: Probation officer    Comment: from home  Tobacco Use   Smoking status: Former    Packs/day: 0.00    Types: Cigarettes    Start date: 11/12/1965    Quit date: 09/06/2008    Years since quitting: 12.7   Smokeless tobacco: Never  Vaping Use   Vaping Use: Never used  Substance and Sexual Activity   Alcohol use: Yes    Alcohol/week: 1.0 standard drink    Types: 1 Glasses of wine per week    Comment: occ   Drug use: Yes    Types: Marijuana    Comment: rarely   Sexual activity: Yes    Birth control/protection: Post-menopausal  Other Topics Concern   Not on file  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   Lives with husband AL   No children   Healthy diet and lifestyle   Social Determinants of Health   Financial Resource Strain: Not on file  Food Insecurity: Not on file  Transportation Needs: Not on file  Physical Activity: Not on file  Stress: Not on file  Social Connections: Not on file     Family History:  The patient'sfamily history includes Arthritis in her father and sister; COPD in her mother; Cancer - Prostate in her brother; Hearing loss in her mother; Heart disease in her father, mother, and sister; Hyperlipidemia in her brother; Hypertension in her brother and  father; Stroke in her mother.   Review of Systems:    Please see the history of present illness.     All other systems reviewed and are otherwise negative except as noted above.   Physical Exam:    VS:  BP 124/60   Pulse (!) 59   Temp 97.8 F (36.6 C) (Oral)   Resp 14   Ht _0  (1.626 m)   Wt 72.6 kg   LMP 07/08/1979 (Approximate)   SpO2 99%   BMI 27.46 kg/m    General: Well developed, well nourished,female appearing in no acute distress. Head: Normocephalic, atraumatic. Neck: No carotid bruits. JVD not elevated.  Lungs: Respirations regular and unlabored, without wheezes or rales.  Heart: Regular rate and rhythm. No S3 or S4.  No murmur, no rubs, or gallops appreciated. Abdomen: Appears non-distended. No obvious abdominal masses. Msk:  Strength and tone appear normal for age.  No obvious joint deformities or effusions. Extremities: No clubbing or cyanosis. No pitting edema.  Distal pedal pulses are 2+ bilaterally. Neuro: Alert and oriented X 3. Moves all extremities spontaneously. No focal deficits noted. Psych:  Responds to questions appropriately with a normal affect. Skin: No rashes or lesions noted  Wt Readings from Last 3 Encounters:  06/13/21 70.3 kg  06/01/21 72.6 kg  05/26/21 73.1 kg     Studies/Labs Reviewed:   EKG:  EKG is not ordered today. EKG from 05/15/2021 is reviewed and shows NSR, HR 71 with slight ST depression along the inferior leads.   Recent Labs: 01/04/2021: TSH <0.01 05/15/2021: BUN 20; Creatinine, Ser 0.66; Hemoglobin 12.3; Platelets 229; Potassium 4.0; Sodium 136   Lipid Panel    Component Value Date/Time   CHOL 255 (H) 04/05/2021 1414   TRIG 152 (H) 04/05/2021 1414   HDL 56 04/05/2021 1414   CHOLHDL 4.6 04/05/2021 1414   LDLCALC 169 (H) 04/05/2021 1414    Additional studies/ records that were reviewed today include:   Cardiac Catheterization: 09/2018 Right dominant coronary anatomy Short left main, widely patent Ostial 25% LAD  followed by a segmental 30 to 40% region of narrowing.  One large diagonal branch arises from this region and is as large as the left anterior descending which has no significant focal obstruction. Circumflex has proximal irregularity up to 30%.  2 large obtuse marginal branches are free of obstruction. The right coronary artery contains a relatively short segmental 40 to 50% narrowing. Left ventricular function is normal with EF greater than 55%.  LVEDP is normal.   RECOMMENDATIONS:   Aggressive risk prevention: LDL less than 70, blood pressure 130/80 mmHg or less, hemoglobin A1c less than 7, 150 minutes or more of moderate physical activity per week, consider screening for sleep apnea, and smoking cessation if appropriate. Consider aspirin although will be at increased bleeding risk when used with Coumadin therapy.   Echocardiogram: 05/2021 IMPRESSIONS     1. Left ventricular ejection fraction, by estimation, is 55 to 60%. The  left ventricle has normal function. The left ventricle has no regional  wall motion abnormalities. Left ventricular diastolic parameters are  consistent with Grade I diastolic  dysfunction (impaired relaxation).   2. Right ventricular systolic function is normal. The right ventricular  size is normal. Tricuspid regurgitation signal is inadequate for assessing  PA pressure.   3. The mitral valve is normal in structure. No evidence of mitral valve  regurgitation. No evidence of mitral stenosis.   4. The aortic valve is tricuspid. Aortic valve regurgitation is not  visualized. No aortic stenosis is present.   5. The inferior vena cava is normal in size with greater than 50%  respiratory variability, suggesting right atrial pressure of 3 mmHg.   NST: 05/2021 Blood pressure demonstrated a normal response to exercise. ST segment depression was noted during stress .Baseline 1mm inferior ST depression increased to 3mm horizontal depression with stress Cannot  calculate Duke treadmill score as protocol was held at stage II due to dyspnea Findings consistent with prior septal myocardial infarction with mild to moderate peri-infarct ischemia. The left ventricular ejection fraction is normal (55-65%). Low to intermediate risk study  Assessment:    No diagnosis found.    Plan:   In order of problems listed above:  1. Dyspnea on Exertion/Abnormal Stress Test - Recent NST showed baseline 1 mm inferior ST depression which increased to 3 mm horizontal depression with exercise and imaging showed findings consistent   with prior septal infarction with mild to moderate peri-infarct ischemia and was a low to intermediate risk study. Dr. Domenic Polite did recommend a cardiac catheterization for definitive evaluation. The patient understands that risks include but are not limited to stroke (1 in 1000), death (1 in 21), kidney failure [usually temporary] (1 in 500), bleeding (1 in 200), allergic reaction [possibly serious] (1 in 200). Recent labs from 05/15/2021 showed stable hemoglobin and platelets along with normal electrolytes and kidney function. Will arrange for cardiac catheterization next week. She has already met with the Coumadin clinic and is aware of how to utilize Lovenox bridging.  2. CAD - She had mild to moderate non-obstructive CAD by cath in 2014 and 09/2018. Given her recent abnormal stress test, a cardiac catheterization was recommended for definitive evaluation as outlined above.  - She has not been on ASA given the need for anticoagulation. She has not been on a beta-blocker due to baseline bradycardia and has been intolerant to statins and has not been interested in other options as she has preferred dietary changes.   3. Paroxysmal Atrial Fibrillation - She reports her palpitations have improved since her prior visit. She does have a ZIO monitor but has not worn this yet and asks if she can wait until after her catheterization to put this on. I  will ask nursing staff to make our [facility] representative aware. - She has remained on Coumadin for anticoagulation as DOAC's have been unaffordable given no insurance coverage for them. She is currently undergoing a Lovenox bridge given her upcoming catheterization.   4. HLD - Followed by her PCP. FLP in 03/2021 showed total cholesterol 255, HDL 56, triglycerides 152 and LDL 169.  She has been intolerant to statins in the past and we did discuss possible PCSK9 inhibitor therapy but she is concerned about the cost of this due to no medication coverage with her insurance.  Pending cath results, would recommend referral to the Lipid Clinic to see if she could possibly be considered for clinical trials.  5. Left-Sided Weakness - She did undergo recent CT and MRI which showed no acute findings.  She does follow with Neurology was encouraged to keep scheduled follow-up.     Medication Adjustments/Labs and Tests Ordered: Current medicines are reviewed at length with the patient today.  Concerns regarding medicines are outlined above.  Medication changes, Labs and Tests ordered today are listed in the Patient Instructions below. There are no outpatient Patient Instructions on file for this admission.   Signed, Sinclair Grooms, MD  06/16/2021 11:47 AM    [facility] [address] [place], [place] [address] Phone: [phone] Fax: [phone]

## 2021-06-16 NOTE — Progress Notes (Addendum)
The patient has been seen in conjunction with Bernerd Pho, NP-C. All aspects of care have been considered and discussed. The patient has been personally interviewed, examined, and all clinical data has been reviewed.  Agree with attached H and P Cath x 3. 50 % LAD H/O Takotsubo Could have microvascular disease.    Cath Lab Visit (complete for each Cath Lab visit)  Clinical Evaluation Leading to the Procedure:   ACS: No.  Non-ACS:    Anginal Classification: CCS III  Anti-ischemic medical therapy: Minimal Therapy (1 class of medications)  Non-Invasive Test Results: No non-invasive testing performed  Prior CABG: No previous CABG         Cardiology Office Note    Date:  06/16/2021   ID:  Heather [name], DOB 08-29-1946, MRN [id number]  PCP:  Doree Albee, MD  Cardiologist: Rozann Lesches, MD    No chief complaint on file.   History of Present Illness:    Heather [name] is a 75 y.o. female with past medical history of CAD (mild to moderate non-obstructive CAD by cath in 2014 and 09/2018), paroxysmal atrial fibrillation, history of Takotsubo cardiomyopathy, HTN, and HLD (statin intolerant) who presents to the office today for follow-up from her recent abnormal stress test.  She was examined by Ermalinda Barrios, PA-C on 04/24/2021 and reported intermittent episodes of numbness and tension along her left arm, face and shoulder which she felt may be secondary to panic attacks but had tried nitroglycerin without improvement. She did report worsening fatigue and exercise intolerance. An Exercise Myoview and echocardiogram were recommended for further evaluation. Her echocardiogram showed a preserved EF of 55 to 60% with no regional wall motion abnormalities and no significant valve abnormalities. Her exercise treadmill stress test showed baseline 1 mm inferior ST depression which increased to 3 mm horizontal depression with exercise and imaging showed findings consistent with  prior septal infarction with mild to moderate peri-infarct ischemia and was a low to intermediate risk study. Findings were reviewed with Dr. Domenic Polite and a cardiac catheterization was recommended for definitive evaluation.  She was evaluated in the ED on 05/15/2021 for numbness along the left side of her body and CT Head showed no acute abnormalities and Brain MRI was negative for acute findings.  In talking with the patient today, she reports a variety of symptoms over the past few months. As mentioned in prior office visits, she has noticed a distinct change in her energy level and also reports dyspnea on exertion. No reported chest pain. She reports her palpitations have improved since her last office visit but she does have a ZIO monitor as previously ordered but asks if she can wait until after her cath to start wearing it. She continues to experience intermittent episodes of left-sided weakness and does plan to follow-up with her Neurologist.     Past Medical History:  Diagnosis Date   Adrenal adenoma    Allergy    Anemia    Anxiety    CAD (coronary artery disease)    a. 50-60% mid LAD at cardiac catheterization 2014 [facility]) b. non-obstructive CAD by cath in 09/2018   Cataract    Depression    DJD (degenerative joint disease) of cervical spine 09/12/2016   History of cardiomyopathy    History of tobacco use    Hyperlipidemia    Neuromuscular disorder ([place])    PAF (paroxysmal atrial fibrillation) ([place])    Diagnosis July 2017 - spontaneously resolved   Statin intolerance  Past Surgical History:  Procedure Laterality Date   ABDOMINAL HYSTERECTOMY  1980   endometriosis   CATARACT EXTRACTION Left 11/2018   INTRAVASCULAR PRESSURE WIRE/FFR STUDY N/A 06/01/2021   Procedure: INTRAVASCULAR PRESSURE WIRE/FFR STUDY;  Surgeon: Belva Crome, MD;  Location: [place] [facility];  Service: Cardiovascular;  Laterality: N/A;   LEFT HEART CATH AND CORONARY ANGIOGRAPHY N/A 09/25/2018    Procedure: LEFT HEART CATH AND CORONARY ANGIOGRAPHY;  Surgeon: Belva Crome, MD;  Location: [place] [facility];  Service: Cardiovascular;  Laterality: N/A;   LEFT HEART CATH AND CORONARY ANGIOGRAPHY N/A 06/01/2021   Procedure: LEFT HEART CATH AND CORONARY ANGIOGRAPHY;  Surgeon: Belva Crome, MD;  Location: [place] [facility];  Service: Cardiovascular;  Laterality: N/A;   TONSILLECTOMY  1954    Current Medications: (Not in an outpatient encounter)    Allergies:   Statins and Sulfa antibiotics   Social History   Socioeconomic History   Marital status: Married    Spouse name: Al   Number of children: 0   Years of education: 18   Highest education level: Not on file  Occupational History   Occupation: Probation officer    Comment: from home  Tobacco Use   Smoking status: Former    Packs/day: 0.00    Types: Cigarettes    Start date: 11/12/1965    Quit date: 09/06/2008    Years since quitting: 12.7   Smokeless tobacco: Never  Vaping Use   Vaping Use: Never used  Substance and Sexual Activity   Alcohol use: Yes    Alcohol/week: 1.0 standard drink    Types: 1 Glasses of wine per week    Comment: occ   Drug use: Yes    Types: Marijuana    Comment: rarely   Sexual activity: Yes    Birth control/protection: Post-menopausal  Other Topics Concern   Not on file  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   Lives with husband AL   No children   Healthy diet and lifestyle   Social Determinants of Health   Financial Resource Strain: Not on file  Food Insecurity: Not on file  Transportation Needs: Not on file  Physical Activity: Not on file  Stress: Not on file  Social Connections: Not on file     Family History:  The patient'sfamily history includes Arthritis in her father and sister; COPD in her mother; Cancer - Prostate in her brother; Hearing loss in her mother; Heart disease in her father, mother, and sister; Hyperlipidemia in her brother; Hypertension in her brother and  father; Stroke in her mother.   Review of Systems:    Please see the history of present illness.     All other systems reviewed and are otherwise negative except as noted above.   Physical Exam:    VS:  BP 124/60   Pulse (!) 59   Temp 97.8 F (36.6 C) (Oral)   Resp 14   Ht _0  (1.626 m)   Wt 72.6 kg   LMP 07/08/1979 (Approximate)   SpO2 99%   BMI 27.46 kg/m    General: Well developed, well nourished,female appearing in no acute distress. Head: Normocephalic, atraumatic. Neck: No carotid bruits. JVD not elevated.  Lungs: Respirations regular and unlabored, without wheezes or rales.  Heart: Regular rate and rhythm. No S3 or S4.  No murmur, no rubs, or gallops appreciated. Abdomen: Appears non-distended. No obvious abdominal masses. Msk:  Strength and tone appear normal for age.  No obvious joint deformities or effusions. Extremities: No clubbing or cyanosis. No pitting edema.  Distal pedal pulses are 2+ bilaterally. Neuro: Alert and oriented X 3. Moves all extremities spontaneously. No focal deficits noted. Psych:  Responds to questions appropriately with a normal affect. Skin: No rashes or lesions noted  Wt Readings from Last 3 Encounters:  06/13/21 70.3 kg  06/01/21 72.6 kg  05/26/21 73.1 kg     Studies/Labs Reviewed:   EKG:  EKG is not ordered today. EKG from 05/15/2021 is reviewed and shows NSR, HR 71 with slight ST depression along the inferior leads.   Recent Labs: 01/04/2021: TSH <0.01 05/15/2021: BUN 20; Creatinine, Ser 0.66; Hemoglobin 12.3; Platelets 229; Potassium 4.0; Sodium 136   Lipid Panel    Component Value Date/Time   CHOL 255 (H) 04/05/2021 1414   TRIG 152 (H) 04/05/2021 1414   HDL 56 04/05/2021 1414   CHOLHDL 4.6 04/05/2021 1414   LDLCALC 169 (H) 04/05/2021 1414    Additional studies/ records that were reviewed today include:   Cardiac Catheterization: 09/2018 Right dominant coronary anatomy Short left main, widely patent Ostial 25% LAD  followed by a segmental 30 to 40% region of narrowing.  One large diagonal branch arises from this region and is as large as the left anterior descending which has no significant focal obstruction. Circumflex has proximal irregularity up to 30%.  2 large obtuse marginal branches are free of obstruction. The right coronary artery contains a relatively short segmental 40 to 50% narrowing. Left ventricular function is normal with EF greater than 55%.  LVEDP is normal.   RECOMMENDATIONS:   Aggressive risk prevention: LDL less than 70, blood pressure 130/80 mmHg or less, hemoglobin A1c less than 7, 150 minutes or more of moderate physical activity per week, consider screening for sleep apnea, and smoking cessation if appropriate. Consider aspirin although will be at increased bleeding risk when used with Coumadin therapy.   Echocardiogram: 05/2021 IMPRESSIONS     1. Left ventricular ejection fraction, by estimation, is 55 to 60%. The  left ventricle has normal function. The left ventricle has no regional  wall motion abnormalities. Left ventricular diastolic parameters are  consistent with Grade I diastolic  dysfunction (impaired relaxation).   2. Right ventricular systolic function is normal. The right ventricular  size is normal. Tricuspid regurgitation signal is inadequate for assessing  PA pressure.   3. The mitral valve is normal in structure. No evidence of mitral valve  regurgitation. No evidence of mitral stenosis.   4. The aortic valve is tricuspid. Aortic valve regurgitation is not  visualized. No aortic stenosis is present.   5. The inferior vena cava is normal in size with greater than 50%  respiratory variability, suggesting right atrial pressure of 3 mmHg.   NST: 05/2021 Blood pressure demonstrated a normal response to exercise. ST segment depression was noted during stress .Baseline 69m inferior ST depression increased to 345mhorizontal depression with stress Cannot  calculate Duke treadmill score as protocol was held at stage II due to dyspnea Findings consistent with prior septal myocardial infarction with mild to moderate peri-infarct ischemia. The left ventricular ejection fraction is normal (55-65%). Low to intermediate risk study  Assessment:    No diagnosis found.    Plan:   In order of problems listed above:  1. Dyspnea on Exertion/Abnormal Stress Test - Recent NST showed baseline 1 mm inferior ST depression which increased to 3 mm horizontal depression with exercise and imaging showed findings consistent  with prior septal infarction with mild to moderate peri-infarct ischemia and was a low to intermediate risk study. Dr. Domenic Polite did recommend a cardiac catheterization for definitive evaluation. The patient understands that risks include but are not limited to stroke (1 in 1000), death (1 in 21), kidney failure [usually temporary] (1 in 500), bleeding (1 in 200), allergic reaction [possibly serious] (1 in 200). Recent labs from 05/15/2021 showed stable hemoglobin and platelets along with normal electrolytes and kidney function. Will arrange for cardiac catheterization next week. She has already met with the Coumadin clinic and is aware of how to utilize Lovenox bridging.  2. CAD - She had mild to moderate non-obstructive CAD by cath in 2014 and 09/2018. Given her recent abnormal stress test, a cardiac catheterization was recommended for definitive evaluation as outlined above.  - She has not been on ASA given the need for anticoagulation. She has not been on a beta-blocker due to baseline bradycardia and has been intolerant to statins and has not been interested in other options as she has preferred dietary changes.   3. Paroxysmal Atrial Fibrillation - She reports her palpitations have improved since her prior visit. She does have a ZIO monitor but has not worn this yet and asks if she can wait until after her catheterization to put this on. I  will ask nursing staff to make our [facility] representative aware. - She has remained on Coumadin for anticoagulation as DOAC's have been unaffordable given no insurance coverage for them. She is currently undergoing a Lovenox bridge given her upcoming catheterization.   4. HLD - Followed by her PCP. FLP in 03/2021 showed total cholesterol 255, HDL 56, triglycerides 152 and LDL 169.  She has been intolerant to statins in the past and we did discuss possible PCSK9 inhibitor therapy but she is concerned about the cost of this due to no medication coverage with her insurance.  Pending cath results, would recommend referral to the Lipid Clinic to see if she could possibly be considered for clinical trials.  5. Left-Sided Weakness - She did undergo recent CT and MRI which showed no acute findings.  She does follow with Neurology was encouraged to keep scheduled follow-up.     Medication Adjustments/Labs and Tests Ordered: Current medicines are reviewed at length with the patient today.  Concerns regarding medicines are outlined above.  Medication changes, Labs and Tests ordered today are listed in the Patient Instructions below. There are no outpatient Patient Instructions on file for this admission.   Signed, Sinclair Grooms, MD  06/16/2021 11:47 AM    [facility] [address] [place], [place] [address] Phone: [phone] Fax: [phone]

## 2021-06-16 NOTE — H&P (Signed)
Completed 06/16/2021.

## 2021-06-19 DIAGNOSIS — R002 Palpitations: Secondary | ICD-10-CM | POA: Diagnosis not present

## 2021-06-21 ENCOUNTER — Ambulatory Visit (INDEPENDENT_AMBULATORY_CARE_PROVIDER_SITE_OTHER): Payer: Medicare Other | Admitting: *Deleted

## 2021-06-21 ENCOUNTER — Other Ambulatory Visit: Payer: Self-pay

## 2021-06-21 ENCOUNTER — Telehealth (INDEPENDENT_AMBULATORY_CARE_PROVIDER_SITE_OTHER): Payer: Self-pay

## 2021-06-21 DIAGNOSIS — I480 Paroxysmal atrial fibrillation: Secondary | ICD-10-CM | POA: Diagnosis not present

## 2021-06-21 DIAGNOSIS — Z5181 Encounter for therapeutic drug level monitoring: Secondary | ICD-10-CM | POA: Diagnosis not present

## 2021-06-21 LAB — POCT INR: INR: 2.2 (ref 2.0–3.0)

## 2021-06-21 NOTE — H&P (Signed)
Please see H and P below.

## 2021-06-21 NOTE — Patient Instructions (Signed)
Continue warfarin 1.5 tablets daily except for 1 tablet on Sunday and Thursdays. Recheck INR in 4 week.

## 2021-06-21 NOTE — Interval H&P Note (Signed)
Cath Lab Visit (complete for each Cath Lab visit)  Clinical Evaluation Leading to the Procedure:   ACS: Yes.    Non-ACS:    Anginal Classification: CCS II  Anti-ischemic medical therapy: Minimal Therapy (1 class of medications)  Non-Invasive Test Results: No non-invasive testing performed  Prior CABG: No previous CABG      History and Physical Interval Note:  06/21/2021 8:48 AM  [name]  has presented today for surgery, with the diagnosis of abnormal stress test.  The various methods of treatment have been discussed with the patient and family. After consideration of risks, benefits and other options for treatment, the patient has consented to  Procedure(s): LEFT HEART CATH AND CORONARY ANGIOGRAPHY (N/A) INTRAVASCULAR PRESSURE WIRE/FFR STUDY (N/A) as a surgical intervention.  The patient's history has been reviewed, patient examined, no change in status, stable for surgery.  I have reviewed the patient's chart and labs.  Questions were answered to the patient's satisfaction.     Heather [name]

## 2021-06-23 ENCOUNTER — Telehealth: Payer: Self-pay

## 2021-06-23 NOTE — Telephone Encounter (Signed)
Pt had called and asked for referral for Hormone Thep and Pain Med.  Per Dr Posey Pronto we do not give RX for either of these.  The patient ask to keep her appt in Sept understanding we will not give RX for these meds.

## 2021-06-26 ENCOUNTER — Encounter: Payer: Self-pay | Admitting: Physician Assistant

## 2021-06-26 ENCOUNTER — Other Ambulatory Visit: Payer: Self-pay

## 2021-06-26 ENCOUNTER — Ambulatory Visit (INDEPENDENT_AMBULATORY_CARE_PROVIDER_SITE_OTHER): Payer: Medicare Other | Admitting: Physician Assistant

## 2021-06-26 VITALS — BP 120/62 | HR 91 | Ht 64.0 in | Wt 158.0 lb

## 2021-06-26 DIAGNOSIS — E785 Hyperlipidemia, unspecified: Secondary | ICD-10-CM

## 2021-06-26 DIAGNOSIS — I480 Paroxysmal atrial fibrillation: Secondary | ICD-10-CM

## 2021-06-26 DIAGNOSIS — R0789 Other chest pain: Secondary | ICD-10-CM

## 2021-06-26 DIAGNOSIS — I25119 Atherosclerotic heart disease of native coronary artery with unspecified angina pectoris: Secondary | ICD-10-CM | POA: Diagnosis not present

## 2021-06-26 NOTE — Patient Instructions (Signed)
Medication Instructions:  Your physician recommends that you continue on your current medications as directed. Please refer to the Current Medication list given to you today.  *If you need a refill on your cardiac medications before your next appointment, please call your pharmacy*   Lab Work: None If you have labs (blood work) drawn today and your tests are completely normal, you will receive your results only by: [place] (if you have MyChart) OR A paper copy in the mail If you have any lab test that is abnormal or we need to change your treatment, we will call you to review the results.   Testing/Procedures: None   Follow-Up: At [facility], you and your health needs are our priority.  As part of our continuing mission to provide you with exceptional heart care, we have created designated Provider Care Teams.  These Care Teams include your primary Cardiologist (physician) and Advanced Practice Providers (APPs -  Physician Assistants and Nurse Practitioners) who all work together to provide you with the care you need, when you need it.  We recommend signing up for the patient portal called "MyChart".  Sign up information is provided on this After Visit Summary.  MyChart is used to connect with patients for Virtual Visits (Telemedicine).  Patients are able to view lab/test results, encounter notes, upcoming appointments, etc.  Non-urgent messages can be sent to your provider as well.   To learn more about what you can do with MyChart, go to [email or web address].    Your next appointment:   1 year(s)  The format for your next appointment:   In Person  Provider:   Rozann Lesches, MD   Other Instructions You have been referred to GI. Please allow them to contact you for your first appt.

## 2021-06-27 ENCOUNTER — Encounter: Payer: Self-pay | Admitting: Neurology

## 2021-06-27 ENCOUNTER — Ambulatory Visit (INDEPENDENT_AMBULATORY_CARE_PROVIDER_SITE_OTHER): Payer: Medicare Other | Admitting: Neurology

## 2021-06-27 DIAGNOSIS — M7918 Myalgia, other site: Secondary | ICD-10-CM

## 2021-06-27 DIAGNOSIS — M542 Cervicalgia: Secondary | ICD-10-CM | POA: Diagnosis not present

## 2021-06-27 DIAGNOSIS — I25119 Atherosclerotic heart disease of native coronary artery with unspecified angina pectoris: Secondary | ICD-10-CM

## 2021-06-27 DIAGNOSIS — M62838 Other muscle spasm: Secondary | ICD-10-CM

## 2021-06-27 DIAGNOSIS — R419 Unspecified symptoms and signs involving cognitive functions and awareness: Secondary | ICD-10-CM | POA: Diagnosis not present

## 2021-06-27 DIAGNOSIS — M549 Dorsalgia, unspecified: Secondary | ICD-10-CM | POA: Diagnosis not present

## 2021-06-27 NOTE — Progress Notes (Signed)
[facility]  PATIENT: Heather [name] DOB: [birth year]  REFERRING DOCTOR OR PCP:  Blanchie Serve SOURCE: patient  _________________________________   HISTORICAL  CHIEF COMPLAINT:  Chief Complaint  Patient presents with   Procedure    Rm 1, w/ husband Al. Here for Botox.     HISTORY OF PRESENT ILLNESS:  Heather [name] is a 75 y.o. woman who has had chronic right-sided mid back pain since 2013.      Update 8/18//22 She continues to note a benefit with the Botox injections for the myofascial pain in the right trunk/back.    Percocet hels some when pain is more severe.     She has only needed to use Percocet a few times in the last 2 months  She has rare insomnia when pain is worse and will rarely take diazepam (usually just one or two a month).  She never combines with a percocet.    She is still experiencing the left neck discomfort and abnormal sensatin in the left side.   For the past 2 months, she has had left-sided weakness, numbness and tingling.  Symptoms build up over a few days.    She get some electric sock sensations in her  left leg.   She feels gait is off.      She also reports more fatigue.  Cognition is doing ok.     Se has had some palpitations, especially at night.  She sometimes still smells vinegar or tobacco scents that are not present    She also has noted  fullness in her left throat.         No new weakness.  She does have some back and leg pain more on the right.    Lumbar MRI in 2014 showed DJD at L3-L4 with spondylolisthesis and possible effects on L3 nerve roots.    She had been worried about STM but feels this is less of an issue now.   She scored 28/30 on the [facility] cognitive assessment last year .  She has recently written a book (infection novel).    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    She is on coumadin for atrial fibrillation.       IMAGING:  MRI results from 10/14/2013: The MRI of the brain showed age  related atrophy and minimal chronic microvascular ischemic change. MRI of the cervical spine showed multilevel mild degenerative changes with left paramedian disc herniation at C3-C4 and left disc protrusion at C4-C5 and C5-C6 and midline disc herniation at C6-C7. There was no report of nerve root compression. MRI of the thoracic spine showed disc desiccation but no herniation or protrusions. MRI of the lumbar spine showed disc bulges at T12-L1 and L2-L3 and disc bulge with facet hypertrophy at L3-L4 and disc bulge with right foraminal annular tear at L4-L5.  MRI brain 7/4/2-22 was personally reviewed.  It shows mild generalized atrophy.  No acute findings.     REVIEW OF SYSTEMS: Constitutional: No fevers, chills, sweats, or change in appetite Eyes: No visual changes, double vision, eye pain Ear, nose and throat: No hearing loss, ear pain, nasal congestion, sore throat Cardiovascular: No chest pain, palpitations.   She has atrial fibrillation and is on warfarin Respiratory:  No shortness of breath at rest or with exertion.   No wheezes GastrointestinaI: No nausea, vomiting, diarrhea, abdominal pain, fecal incontinence Genitourinary:  No dysuria, urinary retention or frequency.  No nocturia. Musculoskeletal:  No neck pain.  She has back pain/thoracic pain as above Integumentary: No rash, pruritus, skin lesions Neurological: as above Psychiatric: No depression at this time.  No anxiety Endocrine: No palpitations, diaphoresis, change in appetite, change in weigh or increased thirst Hematologic/Lymphatic:  No anemia, purpura, petechiae. Allergic/Immunologic: No itchy/runny eyes, nasal congestion, recent allergic reactions, rashes  ALLERGIES: Allergies  Allergen Reactions   Statins Other (See Comments)    Intolerant all statins   Sulfa Antibiotics Rash    HOME MEDICATIONS:  Current Outpatient Medications:    Biotin 10000 MCG TABS, Take 10,000 mcg by mouth daily at 12 noon., Disp: , Rfl:     buPROPion (WELLBUTRIN SR) 150 MG 12 hr tablet, TAKE ONE TABLET BY MOUTH TWICE A DAY, Disp: 180 tablet, Rfl: 1   Cholecalciferol (VITAMIN D3) 125 MCG (5000 UT) TABS, Take 15,000 Units by mouth daily at 12 noon., Disp: , Rfl:    Coenzyme Q10 100 MG capsule, Take 100 mg by mouth daily at 12 noon., Disp: , Rfl:    diazepam (VALIUM) 5 MG tablet, Take 1 tablet (5 mg total) by mouth daily as needed for anxiety., Disp: 30 tablet, Rfl: 0   docusate sodium (COLACE) 100 MG capsule, Take 100 mg by mouth 2 (two) times daily. , Disp: , Rfl:    estradiol (ESTRACE) 0.5 MG tablet, Take 1 tablet (0.5 mg total) by mouth daily., Disp: 90 tablet, Rfl: 1   estradiol (ESTRACE) 2 MG tablet, Take 1 tablet (2 mg total) by mouth daily., Disp: 90 tablet, Rfl: 1   nitrofurantoin, macrocrystal-monohydrate, (MACROBID) 100 MG capsule, Take 1 capsule (100 mg total) by mouth 2 (two) times daily. (Patient taking differently: Take 100 mg by mouth as needed.), Disp: 60 capsule, Rfl: 0   nitroGLYCERIN (NITROSTAT) 0.4 MG SL tablet, Place 1 tablet under the tongue every 5 minutes as needed for chest pain., Disp: 25 tablet, Rfl: 2   NP THYROID 120 MG tablet, Take 1 tablet (120 mg total) by mouth daily before breakfast., Disp: 90 tablet, Rfl: 1   NP THYROID 90 MG tablet, Take 1 tablet (90 mg total) by mouth daily before lunch., Disp: 90 tablet, Rfl: 1   oxyCODONE-acetaminophen (PERCOCET/ROXICET) 5-325 MG tablet, TAKE 1 TABLET DAILY AS NEEDED FOR SEVERE PAIN., Disp: 30 tablet, Rfl: 0   progesterone (PROMETRIUM) 200 MG capsule, Take 1 capsule (200 mg total) by mouth daily., Disp: 90 capsule, Rfl: 1   Testosterone Propionate (FIRST-TESTOSTERONE MC) 2 % CREA, Place 5 mg onto the skin daily., Disp: 30 g, Rfl: 0   warfarin (COUMADIN) 5 MG tablet, Take one to one and one-half tablets by mouth daily or as directed. Resume warfarin today. Following bridging instructions if any., Disp: 135 tablet, Rfl: 1  PAST MEDICAL HISTORY: Past Medical History:   Diagnosis Date   Adrenal adenoma    Allergy    Anemia    Anxiety    CAD (coronary artery disease)    a. 50-60% mid LAD at cardiac catheterization 2014 ([place]) b. non-obstructive CAD by cath in 09/2018   Cataract    Depression    DJD (degenerative joint disease) of cervical spine 09/12/2016   History of cardiomyopathy    History of tobacco use    Hyperlipidemia    Neuromuscular disorder (HCC)    PAF (paroxysmal atrial fibrillation) ([place])    Diagnosis July 2017 - spontaneously resolved   Statin intolerance     PAST SURGICAL HISTORY: Past Surgical History:  Procedure Laterality Date   ABDOMINAL HYSTERECTOMY  1980  endometriosis   CATARACT EXTRACTION Left 11/2018   INTRAVASCULAR PRESSURE WIRE/FFR STUDY N/A 06/01/2021   Procedure: INTRAVASCULAR PRESSURE WIRE/FFR STUDY;  Surgeon: Belva Crome, MD;  Location: [place] [facility];  Service: Cardiovascular;  Laterality: N/A;   LEFT HEART CATH AND CORONARY ANGIOGRAPHY N/A 09/25/2018   Procedure: LEFT HEART CATH AND CORONARY ANGIOGRAPHY;  Surgeon: Belva Crome, MD;  Location: [place] [facility];  Service: Cardiovascular;  Laterality: N/A;   LEFT HEART CATH AND CORONARY ANGIOGRAPHY N/A 06/01/2021   Procedure: LEFT HEART CATH AND CORONARY ANGIOGRAPHY;  Surgeon: Belva Crome, MD;  Location: [place] [facility];  Service: Cardiovascular;  Laterality: N/A;   TONSILLECTOMY  1954    FAMILY HISTORY: Family History  Problem Relation Age of Onset   COPD Mother    Hearing loss Mother    Stroke Mother    Heart disease Mother        chf, died at 36   Arthritis Father    Hypertension Father    Heart disease Father        cad, pvd, died at 16   Heart disease Sister        heart valve   Arthritis Sister    Hyperlipidemia Brother    Hypertension Brother    Cancer - Prostate Brother     SOCIAL HISTORY:  Social History   Socioeconomic History   Marital status: Married    Spouse name: Al   Number of children: 0   Years of  education: 18   Highest education level: Not on file  Occupational History   Occupation: Probation officer    Comment: from home  Tobacco Use   Smoking status: Former    Packs/day: 0.00    Types: Cigarettes    Start date: 11/12/1965    Quit date: 09/06/2008    Years since quitting: 12.8   Smokeless tobacco: Never  Vaping Use   Vaping Use: Never used  Substance and Sexual Activity   Alcohol use: Yes    Alcohol/week: 1.0 standard drink    Types: 1 Glasses of wine per week    Comment: occ   Drug use: Yes    Types: Marijuana    Comment: rarely   Sexual activity: Yes    Birth control/protection: Post-menopausal  Other Topics Concern   Not on file  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   There is no height or weight on file to calculate BMI.   General: The patient is well-developed and well-nourished and in no acute distress.  Head is normocephalic and atraumatic.   Musculoskeletal : She notes tenderness in the mid to lower thoracic intracoastal region from T7-T9 on the right with lesser tenderness in the paraspinal region.    Neurologic Exam  Mental status: The patient is alert and oriented x 3 at the time of the examination.  She scored 28/30 on the [facility] (details above).  Focus and attention seem normal.   Speech is normal.  Cranial nerves: Extraocular muscles are  intact.  Facial strength is normal.   . No obvious hearing deficits are noted.  Motor: She has normal muscle tone, muscle bulk and muscle strength in the arms or legs.  Sensation: Intact   gait and station: Gait and  station are normal.     ASSESSMENT AND PLAN  1. Myofascial pain syndrome   2. Neck pain   3. Cognitive complaints   4. Mid back pain       1.  Inject 100 units Botox split into tender points below the T6-T9 ribs into the intercostal muscles near the midclavicular line.  There were no complications and she tolerated the injections well.    2.   Stay active and exercise as tolerated. 3.   She will return in 3-4 months for next Botox or sooner if  new or worsening neurologic symptoms.   Kelli Egolf A. Felecia Shelling, MD, PhD [id number], [id number] PM Certified in Neurology, Clinical Neurophysiology, Sleep Medicine, Pain Medicine and Neuroimaging  [facility] [address], [place], [place] [address] [phone]

## 2021-06-27 NOTE — Progress Notes (Signed)
Botox- 100 units x 1 vials Lot: [id number] Expiration: 07/2022 NDC: [id number]  Bacteriostatic 0.9% Sodium Chloride- 91m total Lot: [id number]: 02/23 NDC: [id number] B/B

## 2021-06-29 ENCOUNTER — Telehealth: Payer: Self-pay | Admitting: Physician Assistant

## 2021-06-29 DIAGNOSIS — K219 Gastro-esophageal reflux disease without esophagitis: Secondary | ICD-10-CM

## 2021-06-29 NOTE — Telephone Encounter (Signed)
Referral placed to GI, Dr.Castaneda

## 2021-06-29 NOTE — Telephone Encounter (Signed)
Pt called the [place] office she was being referred to and they told her they have not received her referral

## 2021-07-05 ENCOUNTER — Other Ambulatory Visit: Payer: Self-pay | Admitting: Neurology

## 2021-07-05 MED ORDER — OXYCODONE-ACETAMINOPHEN 5-325 MG PO TABS: ORAL_TABLET | ORAL | 0 refills | Status: DC

## 2021-07-05 NOTE — Telephone Encounter (Signed)
Last OV 07/03/21 and medication last filled 04/26/21. Pt is due if MD would like to continue. Will route to Dr Felecia Shelling to be filled.

## 2021-07-05 NOTE — Telephone Encounter (Signed)
Pt is needing a refill request for her oxyCODONE-acetaminophen (PERCOCET/ROXICET) 5-325 MG tablet sent in to the [place], [place]

## 2021-07-06 DIAGNOSIS — R5383 Other fatigue: Secondary | ICD-10-CM | POA: Diagnosis not present

## 2021-07-06 DIAGNOSIS — N951 Menopausal and female climacteric states: Secondary | ICD-10-CM | POA: Diagnosis not present

## 2021-07-10 ENCOUNTER — Encounter (INDEPENDENT_AMBULATORY_CARE_PROVIDER_SITE_OTHER): Payer: Self-pay | Admitting: Gastroenterology

## 2021-07-10 ENCOUNTER — Telehealth: Payer: Self-pay

## 2021-07-10 ENCOUNTER — Encounter (INDEPENDENT_AMBULATORY_CARE_PROVIDER_SITE_OTHER): Payer: Self-pay

## 2021-07-10 ENCOUNTER — Other Ambulatory Visit: Payer: Self-pay

## 2021-07-10 ENCOUNTER — Other Ambulatory Visit (INDEPENDENT_AMBULATORY_CARE_PROVIDER_SITE_OTHER): Payer: Self-pay

## 2021-07-10 ENCOUNTER — Ambulatory Visit (INDEPENDENT_AMBULATORY_CARE_PROVIDER_SITE_OTHER): Payer: Medicare Other | Admitting: Gastroenterology

## 2021-07-10 VITALS — BP 132/74 | HR 67 | Temp 97.8°F | Ht 64.0 in | Wt 156.1 lb

## 2021-07-10 DIAGNOSIS — R070 Pain in throat: Secondary | ICD-10-CM

## 2021-07-10 DIAGNOSIS — R1312 Dysphagia, oropharyngeal phase: Secondary | ICD-10-CM

## 2021-07-10 DIAGNOSIS — R633 Feeding difficulties, unspecified: Secondary | ICD-10-CM

## 2021-07-10 DIAGNOSIS — R1319 Other dysphagia: Secondary | ICD-10-CM | POA: Diagnosis not present

## 2021-07-10 DIAGNOSIS — R0602 Shortness of breath: Secondary | ICD-10-CM | POA: Diagnosis not present

## 2021-07-10 DIAGNOSIS — R1310 Dysphagia, unspecified: Secondary | ICD-10-CM | POA: Insufficient documentation

## 2021-07-10 NOTE — Telephone Encounter (Signed)
   [facility] Pre-operative Risk Assessment    Patient Name: Heather [name]  DOB: Jan 20, 1946 MRN: [id number]  HEARTCARE STAFF:  - IMPORTANT!!!!!! Under Visit Info/Reason for Call, type in Other and utilize the format Clearance MM/DD/YY or Clearance TBD. Do not use dashes or single digits. - Please review there is not already an duplicate clearance open for this procedure. - If request is for dental extraction, please clarify the # of teeth to be extracted. - If the patient is currently at the dentist's office, call Pre-Op Callback Staff (MA/nurse) to input urgent request.  - If the patient is not currently in the dentist office, please route to the Pre-Op pool.  Request for surgical clearance:  What type of surgery is being performed? EGD- Upper Endoscopy  When is this surgery scheduled? 07/18/21  What type of clearance is required (medical clearance vs. Pharmacy clearance to hold med vs. Both)? Pharmacy  Are there any medications that need to be held prior to surgery and how long? Coumadin 5 days  Practice name and name of physician performing surgery? [facility]  What is the office phone number? [phone]   7.   What is the office fax number? [phone]  8.   Anesthesia type (None, local, MAC, general) ? MAC

## 2021-07-10 NOTE — Telephone Encounter (Signed)
Pharmacy, can you please comment on how long Coumadin can be held for upcoming procedure?  Thank you! 

## 2021-07-10 NOTE — H&P (View-Only) (Signed)
Maylon Peppers, M.D. Gastroenterology & Hepatology [facility] [address] [place], [place] [address] Primary Care Physician: Pcp, No No address on file  Referring MD: Rozann Lesches, MD  Chief Complaint: Shortness of breath, throat discomfort and choking  History of Present Illness: Heather [name] is a 75 y.o. female with PMH non-obstructive CAD, atrial fibrillation on warfarin, who presents for evaluation of throat discomfort, choking and shortness of breath.  States that for the last couple of months she has felt shortness of breath when exerting, for example when walking a flight of stairs, bending over or walking. Did not have any issues in the past.  Patient has been seen by cardiology who performed a comprehensive evaluation recently that ruled out any cardiac issues.  In fact, she was referred to our office by them.  She underwent a cath on 05/23/2021 widely patent left main, 20% ostial LAD 25 to 30% mid LAD circumflex widely patent RCA widely patent EF 55%.  Suspect abnormal perfusion on technetium imaging with scar from [place] 10 years ago. Zio monitor NSR rare PAC's, couplets, triplets less than 1% total, rare PVC's.  She also reports that for the last 5 year she has presented occasional episodes of choking, possibly a couple of times a month, which can happen with solids and liquids but possibly more frequently with liquids. She denies any dysphagia or odynophagia but states she mostly has coughing spells when swallowing until it eventually clears. She also reports that she feels a "fullness" in the left side of the neck, which she believes has been going on for less amount of time. Denies any heartburn or odynophagia.Patient had a normal esophagram 2016.  Also reports she underwent an EGD in the past for evaluation of her symptoms described below.  Occasionally she has some pain in her LLQ when she presses her abdomen.  She  takes Colace 200 mg every day and Metamucil, which helps her have a BM every day. She could not tolerate psyllium supplement.  The patient denies having any nausea, vomiting, fever, chills, hematochezia, melena, hematemesis, abdominal distention, diarrhea, jaundice, pruritus or weight loss.  Last EGD:5 years ago per patient at [facility], states she had H. Pylori but no other findings  - no report is available. Performed for evaluation of globus sensation Last Colonoscopy: 5  years ago per patient at [place], states they only found diverticulosis - no report is available  FHx: neg for any gastrointestinal/liver disease, no malignancies Social: former  smoking quit 10 years ago, neg alcohol, uses marijuana infrequently  Surgical: hysterectomy  Past Medical History: Past Medical History:  Diagnosis Date   Adrenal adenoma    Allergy    Anemia    Anxiety    CAD (coronary artery disease)    a. 50-60% mid LAD at cardiac catheterization 2014 ([place]) b. non-obstructive CAD by cath in 09/2018   Cataract    Depression    DJD (degenerative joint disease) of cervical spine 09/12/2016   History of cardiomyopathy    History of tobacco use    Hyperlipidemia    Neuromuscular disorder ([place])    PAF (paroxysmal atrial fibrillation) ([place])    Diagnosis July 2017 - spontaneously resolved   Statin intolerance     Past Surgical History: Past Surgical History:  Procedure Laterality Date   ABDOMINAL HYSTERECTOMY  1980   endometriosis   CATARACT EXTRACTION Left 11/2018   INTRAVASCULAR PRESSURE WIRE/FFR STUDY N/A 06/01/2021   Procedure: INTRAVASCULAR PRESSURE  WIRE/FFR STUDY;  Surgeon: Belva Crome, MD;  Location: [facility];  Service: Cardiovascular;  Laterality: N/A;   LEFT HEART CATH AND CORONARY ANGIOGRAPHY N/A 09/25/2018   Procedure: LEFT HEART CATH AND CORONARY ANGIOGRAPHY;  Surgeon: Belva Crome, MD;  Location: [facility] [facility];  Service: Cardiovascular;  Laterality: N/A;    LEFT HEART CATH AND CORONARY ANGIOGRAPHY N/A 06/01/2021   Procedure: LEFT HEART CATH AND CORONARY ANGIOGRAPHY;  Surgeon: Belva Crome, MD;  Location: [facility];  Service: Cardiovascular;  Laterality: N/A;   TONSILLECTOMY  1954    Family History: Family History  Problem Relation Age of Onset   COPD Mother    Hearing loss Mother    Stroke Mother    Heart disease Mother        chf, died at 32   Arthritis Father    Hypertension Father    Heart disease Father        cad, pvd, died at 71   Heart disease Sister        heart valve   Arthritis Sister    Hyperlipidemia Brother    Hypertension Brother    Cancer - Prostate Brother     Social History: Social History   Tobacco Use  Smoking Status Former   Packs/day: 0.00   Types: Cigarettes   Start date: 11/12/1965   Quit date: 09/06/2008   Years since quitting: 12.8  Smokeless Tobacco Never   Social History   Substance and Sexual Activity  Alcohol Use Yes   Alcohol/week: 1.0 standard drink   Types: 1 Glasses of wine per week   Comment: occ   Social History   Substance and Sexual Activity  Drug Use Yes   Types: Marijuana   Comment: rarely    Allergies: Allergies  Allergen Reactions   Statins Other (See Comments)    Intolerant all statins   Sulfa Antibiotics Rash    Medications: Current Outpatient Medications  Medication Sig Dispense Refill   Biotin 10000 MCG TABS Take 10,000 mcg by mouth daily at 12 noon.     buPROPion (WELLBUTRIN SR) 150 MG 12 hr tablet TAKE ONE TABLET BY MOUTH TWICE A DAY 180 tablet 1   Cholecalciferol (VITAMIN D3) 125 MCG (5000 UT) TABS Take 15,000 Units by mouth daily at 12 noon.     Coenzyme Q10 100 MG capsule Take 100 mg by mouth daily at 12 noon.     diazepam (VALIUM) 5 MG tablet Take 1 tablet (5 mg total) by mouth daily as needed for anxiety. 30 tablet 0   docusate sodium (COLACE) 100 MG capsule Take 100 mg by mouth 2 (two) times daily.      estradiol (ESTRACE) 0.5 MG tablet  Take 1 tablet (0.5 mg total) by mouth daily. 90 tablet 1   estradiol (ESTRACE) 2 MG tablet Take 1 tablet (2 mg total) by mouth daily. 90 tablet 1   nitrofurantoin, macrocrystal-monohydrate, (MACROBID) 100 MG capsule Take 1 capsule (100 mg total) by mouth 2 (two) times daily. (Patient taking differently: Take 100 mg by mouth as needed.) 60 capsule 0   nitroGLYCERIN (NITROSTAT) 0.4 MG SL tablet Place 1 tablet under the tongue every 5 minutes as needed for chest pain. 25 tablet 2   NP THYROID 120 MG tablet Take 1 tablet (120 mg total) by mouth daily before breakfast. 90 tablet 1   NP THYROID 90 MG tablet Take 1 tablet (90 mg total) by mouth daily before lunch. [place]  tablet 1   oxyCODONE-acetaminophen (PERCOCET/ROXICET) 5-325 MG tablet TAKE 1 TABLET DAILY AS NEEDED FOR SEVERE PAIN. 30 tablet 0   progesterone (PROMETRIUM) 200 MG capsule Take 1 capsule (200 mg total) by mouth daily. 90 capsule 1   warfarin (COUMADIN) 5 MG tablet Take one to one and one-half tablets by mouth daily or as directed. Resume warfarin today. Following bridging instructions if any. 135 tablet 1   No current facility-administered medications for this visit.    Review of Systems: GENERAL: negative for malaise, night sweats HEENT: No changes in hearing or vision, no nose bleeds or other nasal problems. NECK: Negative for lumps, goiter, pain and significant neck swelling RESPIRATORY: Negative for cough, wheezing CARDIOVASCULAR: Negative for chest pain, leg swelling, palpitations, orthopnea GI: SEE HPI MUSCULOSKELETAL: Negative for joint pain or swelling, back pain, and muscle pain. SKIN: Negative for lesions, rash PSYCH: Negative for sleep disturbance, mood disorder and recent psychosocial stressors. HEMATOLOGY Negative for prolonged bleeding, bruising easily, and swollen nodes. ENDOCRINE: Negative for cold or heat intolerance, polyuria, polydipsia and goiter. NEURO: negative for tremor, gait imbalance, syncope and  seizures. The remainder of the review of systems is noncontributory.   Physical Exam: BP 132/74 (BP Location: Left Arm, Patient Position: Sitting, Cuff Size: Small)   Pulse 67   Temp 97.8 F (36.6 C) (Oral)   Ht '5\' 4"'$  (1.626 m)   Wt 156 lb 1.6 oz (70.8 kg)   LMP 07/08/1979 (Approximate)   BMI 26.79 kg/m  GENERAL: The patient is AO x3, in no acute distress. HEENT: Head is normocephalic and atraumatic. EOMI are intact. Mouth is well hydrated and without lesions. NECK: Supple. No masses LUNGS: Clear to auscultation. No presence of rhonchi/wheezing/rales. Adequate chest expansion HEART: RRR, normal s1 and s2. ABDOMEN: Soft, nontender, no guarding, no peritoneal signs, and nondistended. BS +. No masses. EXTREMITIES: Without any cyanosis, clubbing, rash, lesions or edema. NEUROLOGIC: AOx3, no focal motor deficit. SKIN: no jaundice, no rashes   Imaging/Labs: as above  I personally reviewed and interpreted the available labs, imaging and endoscopic files.  Impression and Plan: Heather [name] is a 75 y.o. female with PMH non-obstructive CAD, atrial fibrillation on warfarin, who presents for evaluation of throat discomfort, choking and shortness of breath.  Patient has presented intermittent episodes of throat discomfort and choking of unclear etiology.  It seems that she has had chronic episodes for at least the last 5 years without presence of any red flag signs.  We will explore this further with an EGD, but I explained to the patient that her throat discomfort could be related to globus sensation.  As she has also presented some episode of choking, we will evaluate this further with MBS.  I also explained to her that if this investigations are negative, consideration for possible esophageal manometry and pH impedance testing will be held, but she stated she is not interested in pursuing this.  Finally, she has printed episodes of shortness of breath of unclear etiology.  She has had an  extensive cardiac evaluation that has been negative.  I do not consider that her shortness of breath is related to any gastrointestinal etiology as she has not present any heartburn or associated atypical GERD symptoms such as wheezing or hoarseness.  We will refer her to pulmonology For further evaluation of this complaint.  - Schedule EGD - Schedule modified barium swallow - Referral to pulmonology  All questions were answered.      Maylon Peppers, MD Gastroenterology and Hepatology  [facility]

## 2021-07-10 NOTE — Progress Notes (Signed)
Heather [name], M.D. Gastroenterology & Hepatology [facility] [address] [place],  [address] Primary Care Physician: Pcp, No No address on file  Referring MD: Rozann Lesches, MD  Chief Complaint: Shortness of breath, throat discomfort and choking  History of Present Illness: Heather [name] is a 75 y.o. female with PMH non-obstructive CAD, atrial fibrillation on warfarin, who presents for evaluation of throat discomfort, choking and shortness of breath.  States that for the last couple of months she has felt shortness of breath when exerting, for example when walking a flight of stairs, bending over or walking. Did not have any issues in the past.  Patient has been seen by cardiology who performed a comprehensive evaluation recently that ruled out any cardiac issues.  In fact, she was referred to our office by them.  She underwent a cath on 05/23/2021 widely patent left main, 20% ostial LAD 25 to 30% mid LAD circumflex widely patent RCA widely patent EF 55%.  Suspect abnormal perfusion on technetium imaging with scar from [place] 10 years ago. Zio monitor NSR rare PAC's, couplets, triplets less than 1% total, rare PVC's.  She also reports that for the last 5 year she has presented occasional episodes of choking, possibly a couple of times a month, which can happen with solids and liquids but possibly more frequently with liquids. She denies any dysphagia or odynophagia but states she mostly has coughing spells when swallowing until it eventually clears. She also reports that she feels a "fullness" in the left side of the neck, which she believes has been going on for less amount of time. Denies any heartburn or odynophagia.Patient had a normal esophagram 2016.  Also reports she underwent an EGD in the past for evaluation of her symptoms described below.  Occasionally she has some pain in her LLQ when she presses her abdomen.  She  takes Colace 200 mg every day and Metamucil, which helps her have a BM every day. She could not tolerate psyllium supplement.  The patient denies having any nausea, vomiting, fever, chills, hematochezia, melena, hematemesis, abdominal distention, diarrhea, jaundice, pruritus or weight loss.  Last EGD:5 years ago per patient at [facility], states she had H. Pylori but no other findings  - no report is available. Performed for evaluation of globus sensation Last Colonoscopy: 5  years ago per patient at [facility], states they only found diverticulosis - no report is available  FHx: neg for any gastrointestinal/liver disease, no malignancies Social: former  smoking quit 10 years ago, neg alcohol, uses marijuana infrequently  Surgical: hysterectomy  Past Medical History: Past Medical History:  Diagnosis Date   Adrenal adenoma    Allergy    Anemia    Anxiety    CAD (coronary artery disease)    a. 50-60% mid LAD at cardiac catheterization 2014 ([place]) b. non-obstructive CAD by cath in 09/2018   Cataract    Depression    DJD (degenerative joint disease) of cervical spine 09/12/2016   History of cardiomyopathy    History of tobacco use    Hyperlipidemia    Neuromuscular disorder ([place])    PAF (paroxysmal atrial fibrillation) ([place])    Diagnosis July 2017 - spontaneously resolved   Statin intolerance     Past Surgical History: Past Surgical History:  Procedure Laterality Date   ABDOMINAL HYSTERECTOMY  1980   endometriosis   CATARACT EXTRACTION Left 11/2018   INTRAVASCULAR PRESSURE WIRE/FFR STUDY N/A 06/01/2021   Procedure: INTRAVASCULAR PRESSURE  WIRE/FFR STUDY;  Surgeon: Belva Crome, MD;  Location: [facility];  Service: Cardiovascular;  Laterality: N/A;   LEFT HEART CATH AND CORONARY ANGIOGRAPHY N/A 09/25/2018   Procedure: LEFT HEART CATH AND CORONARY ANGIOGRAPHY;  Surgeon: Belva Crome, MD;  Location: [facility];  Service: Cardiovascular;  Laterality: N/A;    LEFT HEART CATH AND CORONARY ANGIOGRAPHY N/A 06/01/2021   Procedure: LEFT HEART CATH AND CORONARY ANGIOGRAPHY;  Surgeon: Belva Crome, MD;  Location: [facility];  Service: Cardiovascular;  Laterality: N/A;   TONSILLECTOMY  1954    Family History: Family History  Problem Relation Age of Onset   COPD Mother    Hearing loss Mother    Stroke Mother    Heart disease Mother        chf, died at 11   Arthritis Father    Hypertension Father    Heart disease Father        cad, pvd, died at 14   Heart disease Sister        heart valve   Arthritis Sister    Hyperlipidemia Brother    Hypertension Brother    Cancer - Prostate Brother     Social History: Social History   Tobacco Use  Smoking Status Former   Packs/day: 0.00   Types: Cigarettes   Start date: 11/12/1965   Quit date: 09/06/2008   Years since quitting: 12.8  Smokeless Tobacco Never   Social History   Substance and Sexual Activity  Alcohol Use Yes   Alcohol/week: 1.0 standard drink   Types: 1 Glasses of wine per week   Comment: occ   Social History   Substance and Sexual Activity  Drug Use Yes   Types: Marijuana   Comment: rarely    Allergies: Allergies  Allergen Reactions   Statins Other (See Comments)    Intolerant all statins   Sulfa Antibiotics Rash    Medications: Current Outpatient Medications  Medication Sig Dispense Refill   Biotin 10000 MCG TABS Take 10,000 mcg by mouth daily at 12 noon.     buPROPion (WELLBUTRIN SR) 150 MG 12 hr tablet TAKE ONE TABLET BY MOUTH TWICE A DAY 180 tablet 1   Cholecalciferol (VITAMIN D3) 125 MCG (5000 UT) TABS Take 15,000 Units by mouth daily at 12 noon.     Coenzyme Q10 100 MG capsule Take 100 mg by mouth daily at 12 noon.     diazepam (VALIUM) 5 MG tablet Take 1 tablet (5 mg total) by mouth daily as needed for anxiety. 30 tablet 0   docusate sodium (COLACE) 100 MG capsule Take 100 mg by mouth 2 (two) times daily.      estradiol (ESTRACE) 0.5 MG tablet  Take 1 tablet (0.5 mg total) by mouth daily. 90 tablet 1   estradiol (ESTRACE) 2 MG tablet Take 1 tablet (2 mg total) by mouth daily. 90 tablet 1   nitrofurantoin, macrocrystal-monohydrate, (MACROBID) 100 MG capsule Take 1 capsule (100 mg total) by mouth 2 (two) times daily. (Patient taking differently: Take 100 mg by mouth as needed.) 60 capsule 0   nitroGLYCERIN (NITROSTAT) 0.4 MG SL tablet Place 1 tablet under the tongue every 5 minutes as needed for chest pain. 25 tablet 2   NP THYROID 120 MG tablet Take 1 tablet (120 mg total) by mouth daily before breakfast. 90 tablet 1   NP THYROID 90 MG tablet Take 1 tablet (90 mg total) by mouth daily before lunch. [place]  tablet 1   oxyCODONE-acetaminophen (PERCOCET/ROXICET) 5-325 MG tablet TAKE 1 TABLET DAILY AS NEEDED FOR SEVERE PAIN. 30 tablet 0   progesterone (PROMETRIUM) 200 MG capsule Take 1 capsule (200 mg total) by mouth daily. 90 capsule 1   warfarin (COUMADIN) 5 MG tablet Take one to one and one-half tablets by mouth daily or as directed. Resume warfarin today. Following bridging instructions if any. 135 tablet 1   No current facility-administered medications for this visit.    Review of Systems: GENERAL: negative for malaise, night sweats HEENT: No changes in hearing or vision, no nose bleeds or other nasal problems. NECK: Negative for lumps, goiter, pain and significant neck swelling RESPIRATORY: Negative for cough, wheezing CARDIOVASCULAR: Negative for chest pain, leg swelling, palpitations, orthopnea GI: SEE HPI MUSCULOSKELETAL: Negative for joint pain or swelling, back pain, and muscle pain. SKIN: Negative for lesions, rash PSYCH: Negative for sleep disturbance, mood disorder and recent psychosocial stressors. HEMATOLOGY Negative for prolonged bleeding, bruising easily, and swollen nodes. ENDOCRINE: Negative for cold or heat intolerance, polyuria, polydipsia and goiter. NEURO: negative for tremor, gait imbalance, syncope and  seizures. The remainder of the review of systems is noncontributory.   Physical Exam: BP 132/74 (BP Location: Left Arm, Patient Position: Sitting, Cuff Size: Small)   Pulse 67   Temp 97.8 F (36.6 C) (Oral)   Ht '5\' 4"'$  (1.626 m)   Wt 156 lb 1.6 oz (70.8 kg)   LMP 07/08/1979 (Approximate)   BMI 26.79 kg/m  GENERAL: The patient is AO x3, in no acute distress. HEENT: Head is normocephalic and atraumatic. EOMI are intact. Mouth is well hydrated and without lesions. NECK: Supple. No masses LUNGS: Clear to auscultation. No presence of rhonchi/wheezing/rales. Adequate chest expansion HEART: RRR, normal s1 and s2. ABDOMEN: Soft, nontender, no guarding, no peritoneal signs, and nondistended. BS +. No masses. EXTREMITIES: Without any cyanosis, clubbing, rash, lesions or edema. NEUROLOGIC: AOx3, no focal motor deficit. SKIN: no jaundice, no rashes   Imaging/Labs: as above  I personally reviewed and interpreted the available labs, imaging and endoscopic files.  Impression and Plan: Heather [name] is a 75 y.o. female with PMH non-obstructive CAD, atrial fibrillation on warfarin, who presents for evaluation of throat discomfort, choking and shortness of breath.  Patient has presented intermittent episodes of throat discomfort and choking of unclear etiology.  It seems that she has had chronic episodes for at least the last 5 years without presence of any red flag signs.  We will explore this further with an EGD, but I explained to the patient that her throat discomfort could be related to globus sensation.  As she has also presented some episode of choking, we will evaluate this further with MBS.  I also explained to her that if this investigations are negative, consideration for possible esophageal manometry and pH impedance testing will be held, but she stated she is not interested in pursuing this.  Finally, she has printed episodes of shortness of breath of unclear etiology.  She has had an  extensive cardiac evaluation that has been negative.  I do not consider that her shortness of breath is related to any gastrointestinal etiology as she has not present any heartburn or associated atypical GERD symptoms such as wheezing or hoarseness.  We will refer her to pulmonology For further evaluation of this complaint.  - Schedule EGD - Schedule modified barium swallow - Referral to pulmonology  All questions were answered.      Heather Peppers, MD Gastroenterology and Hepatology  [facility]

## 2021-07-10 NOTE — Patient Instructions (Addendum)
For further evaluation of this complaint.

## 2021-07-11 DIAGNOSIS — R6882 Decreased libido: Secondary | ICD-10-CM | POA: Diagnosis not present

## 2021-07-11 DIAGNOSIS — N951 Menopausal and female climacteric states: Secondary | ICD-10-CM | POA: Diagnosis not present

## 2021-07-11 DIAGNOSIS — R5383 Other fatigue: Secondary | ICD-10-CM | POA: Diagnosis not present

## 2021-07-11 NOTE — Telephone Encounter (Signed)
    Patient Name: Heather [name]  DOB: Aug 28, 1946 MRN: [id number]  Primary Cardiologist: Rozann Lesches, MD  Chart reviewed as part of pre-operative protocol coverage. Patient is scheduled for an EGD on 07/18/2021 and we were asked to give our recommendations on holding Coumadin. Per Pharmacy and office protocol: "Patient can hold Warfarin (Coumadin) for 5 days prior to procedure. Patient will NOT need bridging with Lovenox (enoxaparin) around procedure."  I will route this recommendation to the requesting party via [facility] fax function and remove from pre-op pool.  Please call with questions.  Darreld Mclean, PA-C 07/11/2021, 3:49 PM

## 2021-07-11 NOTE — Telephone Encounter (Signed)
Patient with diagnosis of afib on warfarin for anticoagulation.    Procedure: EGD- Upper Endoscopy Date of procedure: 07/18/21  CHA2DS2-VASc Score = 4  This indicates a 4.8% annual risk of stroke. The patient's score is based upon: CHF History: No HTN History: No Diabetes History: No Stroke History: No Vascular Disease History: Yes Age Score: 2 Gender Score: 1      There is TIA noted on patients problem list. Cannot find any evidence of this. Patient previously cleared by Dr. Domenic Polite to hold warfarin without a bridge.  Per office protocol, patient can hold warfarin for 5 days prior to procedure.   Patient will NOT need bridging with Lovenox (enoxaparin) around procedure.

## 2021-07-12 ENCOUNTER — Ambulatory Visit (INDEPENDENT_AMBULATORY_CARE_PROVIDER_SITE_OTHER): Payer: Medicare Other | Admitting: Gastroenterology

## 2021-07-12 ENCOUNTER — Ambulatory Visit (INDEPENDENT_AMBULATORY_CARE_PROVIDER_SITE_OTHER): Payer: 59 | Admitting: Internal Medicine

## 2021-07-12 NOTE — Patient Instructions (Signed)
Heather [name]  07/12/2021     '@PREFPERIOPPHARMACY'$ @   Your procedure is scheduled on  07/18/2021.   Report to [facility] at  1015 A.M.   Call this number if you have problems the morning of surgery:  [phone]   Remember:  Follow the diet instructions given to you by the office.     Take these medicines the morning of surgery with A SIP OF WATER      wellbutrin, valium (if needed), NP thyroid, oxycodone (if needed).      Do not wear jewelry, make-up or nail polish.  Do not wear lotions, powders, or perfumes, or deodorant.  Do not shave 48 hours prior to surgery.  Men may shave face and neck.  Do not bring valuables to the hospital.  [facility] is not responsible for any belongings or valuables.  Contacts, dentures or bridgework may not be worn into surgery.  Leave your suitcase in the car.  After surgery it may be brought to your room.  For patients admitted to the hospital, discharge time will be determined by your treatment team.  Patients discharged the day of surgery will not be allowed to drive home and must have someone with them for 24 hours.    Special instructions:   DO NOT smoke tobacco or vape for 24 hours before your procedure.  Please read over the following fact sheets that you were given. Anesthesia Post-op Instructions and Care and Recovery After Surgery      Upper Endoscopy, Adult, Care After This sheet gives you information about how to care for yourself after your procedure. Your health care provider may also give you more specific instructions. If you have problems or questions, contact your health care provider. What can I expect after the procedure? After the procedure, it is common to have: A sore throat. Mild stomach pain or discomfort. Bloating. Nausea. Follow these instructions at home:  Follow instructions from your health care provider about what to eat or drink after your procedure. Return to your normal activities as told  by your health care provider. Ask your health care provider what activities are safe for you. Take over-the-counter and prescription medicines only as told by your health care provider. If you were given a sedative during the procedure, it can affect you for several hours. Do not drive or operate machinery until your health care provider says that it is safe. Keep all follow-up visits as told by your health care provider. This is important. Contact a health care provider if you have: A sore throat that lasts longer than one day. Trouble swallowing. Get help right away if: You vomit blood or your vomit looks like coffee grounds. You have: A fever. Bloody, black, or tarry stools. A severe sore throat or you cannot swallow. Difficulty breathing. Severe pain in your chest or abdomen. Summary After the procedure, it is common to have a sore throat, mild stomach discomfort, bloating, and nausea. If you were given a sedative during the procedure, it can affect you for several hours. Do not drive or operate machinery until your health care provider says that it is safe. Follow instructions from your health care provider about what to eat or drink after your procedure. Return to your normal activities as told by your health care provider. This information is not intended to replace advice given to you by your health care provider. Make sure you discuss any questions you have with your health care  provider. Document Revised: 10/27/2019 Document Reviewed: 03/31/2018 Elsevier Patient Education  2022 [employer] After This sheet gives you information about how to care for yourself after your procedure. Your health care provider may also give you more specific instructions. If you have problems or questions, contact your health care provider. What can I expect after the procedure? After the procedure, it is common to have: Tiredness. Forgetfulness about what happened  after the procedure. Impaired judgment for important decisions. Nausea or vomiting. Some difficulty with balance. Follow these instructions at home: For the time period you were told by your health care provider:   Rest as needed. Do not participate in activities where you could fall or become injured. Do not drive or use machinery. Do not drink alcohol. Do not take sleeping pills or medicines that cause drowsiness. Do not make important decisions or sign legal documents. Do not take care of children on your own. Eating and drinking Follow the diet that is recommended by your health care provider. Drink enough fluid to keep your urine pale yellow. If you vomit: Drink water, juice, or soup when you can drink without vomiting. Make sure you have little or no nausea before eating solid foods. General instructions Have a responsible adult stay with you for the time you are told. It is important to have someone help care for you until you are awake and alert. Take over-the-counter and prescription medicines only as told by your health care provider. If you have sleep apnea, surgery and certain medicines can increase your risk for breathing problems. Follow instructions from your health care provider about wearing your sleep device: Anytime you are sleeping, including during daytime naps. While taking prescription pain medicines, sleeping medicines, or medicines that make you drowsy. Avoid smoking. Keep all follow-up visits as told by your health care provider. This is important. Contact a health care provider if: You keep feeling nauseous or you keep vomiting. You feel light-headed. You are still sleepy or having trouble with balance after 24 hours. You develop a rash. You have a fever. You have redness or swelling around the IV site. Get help right away if: You have trouble breathing. You have new-onset confusion at home. Summary For several hours after your procedure, you may feel  tired. You may also be forgetful and have poor judgment. Have a responsible adult stay with you for the time you are told. It is important to have someone help care for you until you are awake and alert. Rest as told. Do not drive or operate machinery. Do not drink alcohol or take sleeping pills. Get help right away if you have trouble breathing, or if you suddenly become confused. This information is not intended to replace advice given to you by your health care provider. Make sure you discuss any questions you have with your health care provider. Document Revised: 07/14/2020 Document Reviewed: 10/01/2019 Elsevier Patient Education  2022 [employer].

## 2021-07-13 ENCOUNTER — Other Ambulatory Visit: Payer: Self-pay

## 2021-07-13 ENCOUNTER — Encounter (HOSPITAL_COMMUNITY)
Admission: RE | Admit: 2021-07-13 | Discharge: 2021-07-13 | Disposition: A | Discharge status: Home / Self Care | Payer: Medicare Other | Source: Ambulatory Visit | Attending: Gastroenterology | Admitting: Gastroenterology

## 2021-07-13 ENCOUNTER — Encounter (HOSPITAL_COMMUNITY): Payer: Self-pay

## 2021-07-18 ENCOUNTER — Encounter (HOSPITAL_COMMUNITY)
Admission: RE | Disposition: A | Discharge status: Home / Self Care | Payer: Self-pay | Source: Home / Self Care | Attending: Gastroenterology

## 2021-07-18 ENCOUNTER — Ambulatory Visit (HOSPITAL_COMMUNITY)
Admission: RE | Admit: 2021-07-18 | Discharge: 2021-07-18 | Disposition: A | Discharge status: Home / Self Care | Payer: Medicare Other | Attending: Gastroenterology | Admitting: Gastroenterology

## 2021-07-18 ENCOUNTER — Encounter (HOSPITAL_COMMUNITY): Payer: Self-pay | Admitting: Gastroenterology

## 2021-07-18 ENCOUNTER — Ambulatory Visit (HOSPITAL_COMMUNITY): Payer: Medicare Other | Admitting: Anesthesiology

## 2021-07-18 ENCOUNTER — Other Ambulatory Visit: Payer: Self-pay

## 2021-07-18 DIAGNOSIS — Z79890 Hormone replacement therapy (postmenopausal): Secondary | ICD-10-CM | POA: Diagnosis not present

## 2021-07-18 DIAGNOSIS — I2510 Atherosclerotic heart disease of native coronary artery without angina pectoris: Secondary | ICD-10-CM | POA: Diagnosis not present

## 2021-07-18 DIAGNOSIS — Z7901 Long term (current) use of anticoagulants: Secondary | ICD-10-CM | POA: Diagnosis not present

## 2021-07-18 DIAGNOSIS — Z87891 Personal history of nicotine dependence: Secondary | ICD-10-CM | POA: Insufficient documentation

## 2021-07-18 DIAGNOSIS — Z882 Allergy status to sulfonamides status: Secondary | ICD-10-CM | POA: Diagnosis not present

## 2021-07-18 DIAGNOSIS — Z888 Allergy status to other drugs, medicaments and biological substances status: Secondary | ICD-10-CM | POA: Insufficient documentation

## 2021-07-18 DIAGNOSIS — Z79899 Other long term (current) drug therapy: Secondary | ICD-10-CM | POA: Insufficient documentation

## 2021-07-18 DIAGNOSIS — Z8249 Family history of ischemic heart disease and other diseases of the circulatory system: Secondary | ICD-10-CM | POA: Diagnosis not present

## 2021-07-18 DIAGNOSIS — K449 Diaphragmatic hernia without obstruction or gangrene: Secondary | ICD-10-CM | POA: Diagnosis not present

## 2021-07-18 DIAGNOSIS — K31819 Angiodysplasia of stomach and duodenum without bleeding: Secondary | ICD-10-CM | POA: Diagnosis not present

## 2021-07-18 DIAGNOSIS — I480 Paroxysmal atrial fibrillation: Secondary | ICD-10-CM | POA: Diagnosis not present

## 2021-07-18 DIAGNOSIS — J392 Other diseases of pharynx: Secondary | ICD-10-CM | POA: Diagnosis not present

## 2021-07-18 DIAGNOSIS — R1310 Dysphagia, unspecified: Secondary | ICD-10-CM | POA: Insufficient documentation

## 2021-07-18 DIAGNOSIS — J029 Acute pharyngitis, unspecified: Secondary | ICD-10-CM | POA: Diagnosis not present

## 2021-07-18 HISTORY — PX: BIOPSY: SHX5522

## 2021-07-18 HISTORY — PX: SAVORY DILATION: SHX5439

## 2021-07-18 HISTORY — PX: ESOPHAGOGASTRODUODENOSCOPY (EGD) WITH PROPOFOL: SHX5813

## 2021-07-18 SURGERY — ESOPHAGOGASTRODUODENOSCOPY (EGD) WITH PROPOFOL
Anesthesia: General

## 2021-07-18 MED ORDER — LACTATED RINGERS IV SOLN: INTRAVENOUS | Status: DC

## 2021-07-18 MED ORDER — STERILE WATER FOR IRRIGATION IR SOLN
Status: DC | PRN
  Administered 2021-07-18: 100 mL

## 2021-07-18 MED ORDER — LIDOCAINE HCL (CARDIAC) PF 100 MG/5ML IV SOSY
PREFILLED_SYRINGE | INTRAVENOUS | Status: DC | PRN
  Administered 2021-07-18: 80 mg via INTRAVENOUS

## 2021-07-18 MED ORDER — PROPOFOL 10 MG/ML IV BOLUS
INTRAVENOUS | Status: DC | PRN
  Administered 2021-07-18: 100 mg via INTRAVENOUS
  Administered 2021-07-18: 125 ug/kg/min via INTRAVENOUS

## 2021-07-18 NOTE — Op Note (Addendum)
[facility] Patient Name: Heather [name] Procedure Date: 07/18/2021 11:31 AM MRN: [id number] Date of Birth: 12-Feb-1946 Attending MD: Maylon Peppers ,  CSN: [id number] Age: 75 Admit Type: Outpatient Procedure:                Upper GI endoscopy Indications:              Dysphagia, Sore throat Providers:                Maylon Peppers, Crystal Page, Lambert Mody,                            Randa Spike, Technician Referring MD:              Medicines:                Monitored Anesthesia Care Complications:            No immediate complications. Estimated Blood Loss:     Estimated blood loss: none. Procedure:                Pre-Anesthesia Assessment:                           - Prior to the procedure, a History and Physical                            was performed, and patient medications, allergies                            and sensitivities were reviewed. The patient's                            tolerance of previous anesthesia was reviewed.                           - The risks and benefits of the procedure and the                            sedation options and risks were discussed with the                            patient. All questions were answered and informed                            consent was obtained.                           - ASA Grade Assessment: II - A patient with mild                            systemic disease.                           After obtaining informed consent, the endoscope was                            passed under direct vision. Throughout the  procedure, the patient's blood pressure, pulse, and                            oxygen saturations were monitored continuously. The                            [id number]-[id number]) scope was introduced through the                            mouth, and advanced to the second part of duodenum.                            The upper GI endoscopy was accomplished without                             difficulty. The patient tolerated the procedure                            well. Scope In: 11:39:21 AM Scope Out: 11:51:56 AM Total Procedure Duration: 0 hours 12 minutes 35 seconds  Findings:      No endoscopic abnormality was evident in the esophagus to explain the       patient's complaint of dysphagia. It was decided, however, to proceed       with dilation of the entire esophagus. A guidewire was placed and the       scope was withdrawn. Dilation was performed with a Savary dilator with       no resistance at 18 mm. No heme was observed upon reinspection. Biopsies       were obtained from the proximal and distal esophagus with cold forceps       for histology.      A 1 cm hiatal hernia was present.      A single small angiodysplastic lesion with no bleeding was found in the       gastric body.      The examined duodenum was normal. Impression:               - No endoscopic esophageal abnormality to explain                            patient's dysphagia. Esophagus dilated. Dilated.                            Biopsied.                           - 1 cm hiatal hernia.                           - A single non-bleeding angiodysplastic lesion in                            the stomach.                           - Normal examined duodenum. Moderate Sedation:      Per Anesthesia Care Recommendation:           -  Discharge patient to home (ambulatory).                           - Resume previous diet.                           - Await pathology results.                           - Follow up with pulmonology for shortness of                            breath evaluation. Procedure Code(s):        --- Professional ---                           [phone], Esophagogastroduodenoscopy, flexible,                            transoral; with insertion of guide wire followed by                            passage of dilator(s) through esophagus over guide                            wire                            [id number], 66, Esophagogastroduodenoscopy, flexible,                            transoral; with biopsy, single or multiple Diagnosis Code(s):        --- Professional ---                           R13.10, Dysphagia, unspecified                           K44.9, Diaphragmatic hernia without obstruction or                            gangrene                           K31.819, Angiodysplasia of stomach and duodenum                            without bleeding                           J02.9, Acute pharyngitis, unspecified CPT copyright 2019 American Medical Association. All rights reserved. The codes documented in this report are preliminary and upon coder review may  be revised to meet current compliance requirements. Maylon Peppers, MD Maylon Peppers,  07/18/2021 11:56:16 AM This report has been signed electronically. Number of Addenda: 0

## 2021-07-18 NOTE — Progress Notes (Signed)
Patient complaining of epigastric pain, some relief with burping

## 2021-07-18 NOTE — Interval H&P Note (Signed)
History and Physical Interval Note:  07/18/2021 11:08 AM Heather [name] is a 75 y.o. female with PMH non-obstructive CAD, atrial fibrillation on warfarin, who presents for evaluation of throat discomfort, choking.  Patient reports that she has persisted with throat discomfort and choking.  Denies any nausea, vomiting, fever, chills, melena or hematochezia.  Still presenting recurrent episodes of shortness of breath.  BP (!) 127/55   Temp 98 F (36.7 C) (Oral)   Resp 13   Ht '5\' 4"'$  (1.626 m)   Wt 69.9 kg   LMP 07/08/1979 (Approximate)   SpO2 99%   BMI 26.43 kg/m  GENERAL: The patient is AO x3, in no acute distress. HEENT: Head is normocephalic and atraumatic. EOMI are intact. Mouth is well hydrated and without lesions. NECK: Supple. No masses LUNGS: Clear to auscultation. No presence of rhonchi/wheezing/rales. Adequate chest expansion HEART: RRR, normal s1 and s2. ABDOMEN: Soft, nontender, no guarding, no peritoneal signs, and nondistended. BS +. No masses. EXTREMITIES: Without any cyanosis, clubbing, rash, lesions or edema. NEUROLOGIC: AOx3, no focal motor deficit. SKIN: no jaundice, no rashes   [name]  has presented today for surgery, with the diagnosis of Dysphagia Throat Discomfort.  The various methods of treatment have been discussed with the patient and family. After consideration of risks, benefits and other options for treatment, the patient has consented to  Procedure(s) with comments: ESOPHAGOGASTRODUODENOSCOPY (EGD) WITH PROPOFOL (N/A) - 12:00 as a surgical intervention.  The patient's history has been reviewed, patient examined, no change in status, stable for surgery.  I have reviewed the patient's chart and labs.  Questions were answered to the patient's satisfaction.     [name]

## 2021-07-18 NOTE — Discharge Instructions (Addendum)
You are being discharged to home.  Resume your previous diet.  We are waiting for your pathology results.  Follow up with pulmonology for shortness of breath evaluation. Restart coumadin today.

## 2021-07-18 NOTE — Anesthesia Postprocedure Evaluation (Signed)
Anesthesia Post Note  Patient: Heather [name]  Procedure(s) Performed: ESOPHAGOGASTRODUODENOSCOPY (EGD) WITH PROPOFOL SAVORY DILATION BIOPSY  Patient location during evaluation: Phase II Anesthesia Type: General Level of consciousness: awake and alert and oriented Pain management: pain level controlled Vital Signs Assessment: post-procedure vital signs reviewed and stable Respiratory status: spontaneous breathing and respiratory function stable Cardiovascular status: blood pressure returned to baseline and stable Postop Assessment: no apparent nausea or vomiting Anesthetic complications: no   No notable events documented.   Last Vitals:  Vitals:   07/18/21 1156 07/18/21 1159  BP: (!) 108/48 101/77  Pulse: (!) 55 64  Resp: 19 18  Temp: 36.7 C   SpO2: 100% 99%    Last Pain:  Vitals:   07/18/21 1159  TempSrc:   PainSc: 0-No pain                 Caera Enwright C Jaynee Winters

## 2021-07-18 NOTE — Anesthesia Preprocedure Evaluation (Signed)
Anesthesia Evaluation  Patient identified by MRN, date of birth, ID band Patient awake    Reviewed: Allergy & Precautions, NPO status , Patient's Chart, lab work & pertinent test results  History of Anesthesia Complications Negative for: history of anesthetic complications  Airway Mallampati: II  TM Distance: >3 FB Neck ROM: Full    Dental  (+) Dental Advisory Given, Missing   Pulmonary shortness of breath and with exertion, former smoker,    Pulmonary exam normal breath sounds clear to auscultation       Cardiovascular Exercise Tolerance: Good + CAD (negative cath) and + Past MI (Takotsubo syndrome)  Normal cardiovascular exam+ dysrhythmias Atrial Fibrillation  Rhythm:Regular Rate:Normal  RECOMMENDATIONS:  Suspect the abnormal perfusion on technetium imaging represents residual scar from episode of Takotsubo 10 years ago. Unable to identify any significant coronary flow abnormalities.  Have still not excluded the possibility of coronary spasm which was not tested for today. This is now her third catheterization with essentially widely patent coronaries.  No microvascular dysfunction has been identified.  The stenoses in the LAD are not hemodynamically significant.  Attention should be turned to other explanations for the atypical dyspnea and arm and leg numbness that she complains of.     Neuro/Psych PSYCHIATRIC DISORDERS Anxiety Depression TIA Neuromuscular disease    GI/Hepatic negative GI ROS, (+)     substance abuse  marijuana use,   Endo/Other  Hypothyroidism   Renal/GU negative Renal ROS     Musculoskeletal  (+) Arthritis , Osteoarthritis,    Abdominal   Peds  Hematology  (+) anemia ,   Anesthesia Other Findings Right sided thoracic pain  Reproductive/Obstetrics                            Anesthesia Physical Anesthesia Plan  ASA: 3  Anesthesia Plan: General   Post-op Pain  Management:    Induction: Intravenous  PONV Risk Score and Plan: TIVA  Airway Management Planned: Nasal Cannula and Natural Airway  Additional Equipment:   Intra-op Plan:   Post-operative Plan:   Informed Consent: I have reviewed the patients History and Physical, chart, labs and discussed the procedure including the risks, benefits and alternatives for the proposed anesthesia with the patient or authorized representative who has indicated his/her understanding and acceptance.     Dental advisory given  Plan Discussed with: CRNA and Surgeon  Anesthesia Plan Comments:        Anesthesia Quick Evaluation

## 2021-07-18 NOTE — Transfer of Care (Signed)
Immediate Anesthesia Transfer of Care Note  Patient: Heather [name]  Procedure(s) Performed: ESOPHAGOGASTRODUODENOSCOPY (EGD) WITH PROPOFOL SAVORY DILATION BIOPSY  Patient Location: Short Stay  Anesthesia Type:General  Level of Consciousness: awake, alert , oriented and patient cooperative  Airway & Oxygen Therapy: Patient Spontanous Breathing  Post-op Assessment: Report given to RN, Post -op Vital signs reviewed and stable and Patient moving all extremities X 4  Post vital signs: Reviewed and stable  Last Vitals:  Vitals Value Taken Time  BP    Temp    Pulse    Resp    SpO2      Last Pain:  Vitals:   07/18/21 1135  TempSrc:   PainSc: 0-No pain      Patients Stated Pain Goal: 8 ([id number])  Complications: No notable events documented.

## 2021-07-19 LAB — SURGICAL PATHOLOGY

## 2021-07-20 ENCOUNTER — Other Ambulatory Visit: Payer: Self-pay

## 2021-07-20 ENCOUNTER — Ambulatory Visit (INDEPENDENT_AMBULATORY_CARE_PROVIDER_SITE_OTHER): Payer: Medicare Other | Admitting: *Deleted

## 2021-07-20 DIAGNOSIS — I480 Paroxysmal atrial fibrillation: Secondary | ICD-10-CM

## 2021-07-20 DIAGNOSIS — Z5181 Encounter for therapeutic drug level monitoring: Secondary | ICD-10-CM | POA: Diagnosis not present

## 2021-07-20 LAB — POCT INR: INR: 1.1 — AB (ref 2.0–3.0)

## 2021-07-20 NOTE — Patient Instructions (Signed)
Had colonoscopy on 9/6.  Was off warfarin 5 days. Take warfarin 2 tablets tonight and tomorrow night then resume 1.5 tablets daily except for 1 tablet on Sunday and Thursdays. Recheck INR in 1 week.

## 2021-07-24 ENCOUNTER — Other Ambulatory Visit (INDEPENDENT_AMBULATORY_CARE_PROVIDER_SITE_OTHER): Payer: Self-pay | Admitting: Internal Medicine

## 2021-07-24 MED ORDER — ESOMEPRAZOLE MAGNESIUM 40 MG PO CPDR: 40.0000 mg | DELAYED_RELEASE_CAPSULE | Freq: Every day | ORAL | 2 refills | Status: DC

## 2021-07-24 NOTE — Telephone Encounter (Signed)
Patient called back. She says Nexium at [facility] on [address] with Rx card would cost $80 per month. Prescription sent for 1 month with 2 refills. Dose is 40 mg daily before breakfast  Patient advised to call office with progress report in 3 to 4 weeks.

## 2021-07-25 ENCOUNTER — Encounter (HOSPITAL_COMMUNITY): Payer: Self-pay | Admitting: Gastroenterology

## 2021-07-26 ENCOUNTER — Encounter: Payer: Medicare Other | Admitting: Neurology

## 2021-07-27 ENCOUNTER — Ambulatory Visit (INDEPENDENT_AMBULATORY_CARE_PROVIDER_SITE_OTHER): Payer: Medicare Other | Admitting: *Deleted

## 2021-07-27 DIAGNOSIS — Z5181 Encounter for therapeutic drug level monitoring: Secondary | ICD-10-CM | POA: Diagnosis not present

## 2021-07-27 DIAGNOSIS — I480 Paroxysmal atrial fibrillation: Secondary | ICD-10-CM | POA: Diagnosis not present

## 2021-07-27 LAB — POCT INR: INR: 2.3 (ref 2.0–3.0)

## 2021-07-27 NOTE — Patient Instructions (Signed)
Description   Continue to take warfarin 1.5 tablets daily except for 1 tablet on Sundays and Thursdays. Recheck INR in 4 weeks.

## 2021-07-31 ENCOUNTER — Encounter: Payer: Self-pay | Admitting: Internal Medicine

## 2021-07-31 ENCOUNTER — Other Ambulatory Visit: Payer: Self-pay

## 2021-07-31 ENCOUNTER — Ambulatory Visit (INDEPENDENT_AMBULATORY_CARE_PROVIDER_SITE_OTHER): Payer: Medicare Other | Admitting: Internal Medicine

## 2021-07-31 VITALS — BP 119/71 | HR 65 | Temp 98.0°F | Resp 18 | Ht 64.0 in | Wt 155.0 lb

## 2021-07-31 DIAGNOSIS — N390 Urinary tract infection, site not specified: Secondary | ICD-10-CM

## 2021-07-31 DIAGNOSIS — F418 Other specified anxiety disorders: Secondary | ICD-10-CM

## 2021-07-31 DIAGNOSIS — M7918 Myalgia, other site: Secondary | ICD-10-CM

## 2021-07-31 DIAGNOSIS — I2510 Atherosclerotic heart disease of native coronary artery without angina pectoris: Secondary | ICD-10-CM | POA: Diagnosis not present

## 2021-07-31 DIAGNOSIS — Z23 Encounter for immunization: Secondary | ICD-10-CM | POA: Diagnosis not present

## 2021-07-31 DIAGNOSIS — E782 Mixed hyperlipidemia: Secondary | ICD-10-CM | POA: Diagnosis not present

## 2021-07-31 DIAGNOSIS — Z7689 Persons encountering health services in other specified circumstances: Secondary | ICD-10-CM | POA: Diagnosis not present

## 2021-07-31 DIAGNOSIS — Z789 Other specified health status: Secondary | ICD-10-CM

## 2021-07-31 DIAGNOSIS — U099 Post covid-19 condition, unspecified: Secondary | ICD-10-CM | POA: Diagnosis not present

## 2021-07-31 DIAGNOSIS — I480 Paroxysmal atrial fibrillation: Secondary | ICD-10-CM | POA: Diagnosis not present

## 2021-07-31 NOTE — Assessment & Plan Note (Signed)
Care established Previous chart reviewed History and medications reviewed with the patient 

## 2021-07-31 NOTE — Patient Instructions (Signed)
Please continue taking medications as prescribed.  Please continue to follow low salt diet and ambulate as tolerated. 

## 2021-07-31 NOTE — Progress Notes (Signed)
New Patient Office Visit  Subjective:  Patient ID: Heather [name], female    DOB: 21-Sep-1946  Age: 75 y.o. MRN: [id number]  CC:  Chief Complaint  Patient presents with   New Patient (Initial Visit)    New patient was seeing dr Anastasio Champion thinks she has long covid she had covid awhile back since then she has had fatigue sob smell is off and palpitations. Has been to ER and saw cardio all checks out would like to be referred to long haul covid clinic    HPI Ariday Weichman is a 75 y.o. female with PMH of CAD, PAF, HLD, myofascial pain syndrome, depression with anxiety and post-COVID syndrome who presents for establishing care.  She has history of CAD s/p stent placement and paroxysmal atrial fibrillation.  She is on Coumadin currently.  She sees cardiologist for it.  She has a history of HLD, but did not tolerate 3 different statins in the past.  She has been taking NP thyroid for HLD despite her A. fib.  Her TSH was <0.01 in 12/2020.  She follows up with hormone clinic for it.  She prefers to stay on it for now.  She also takes estradiol and progesterone currently.  She sees neurologist for myofascial pain syndrome, for which she gets Botox injections.  She is on Wellbutrin and as needed diazepam for depression with anxiety.  She denies any anhedonia, SI or HI.  She complains of chronic fatigue, dyspnea and loss of smell sensation since she had COVID in 2021.  She has tried to contact post COVID syndrome clinic, but has not been able to reach them yet.  She is going to see Pulmonologist for dyspnea as well.  She is up-to-date with COVID vaccination.  She received flu vaccine in the office today.  Past Medical History:  Diagnosis Date   Adrenal adenoma    Allergy    Anemia    Anxiety    CAD (coronary artery disease)    a. 50-60% mid LAD at cardiac catheterization 2014 [facility]) b. non-obstructive CAD by cath in 09/2018   Cataract    Depression    DJD (degenerative joint disease) of  cervical spine 09/12/2016   H. pylori infection 07/19/2017   History of cardiomyopathy    History of COVID-19 12/10/2019   History of tobacco use    Hyperlipidemia    Mid back pain 02/08/2017   Neuromuscular disorder (HCC)    PAF (paroxysmal atrial fibrillation) ([place])    Diagnosis July 2017 - spontaneously resolved   Smell disturbance 06/13/2021   Statin intolerance     Past Surgical History:  Procedure Laterality Date   ABDOMINAL HYSTERECTOMY  1980   endometriosis   BIOPSY  07/18/2021   Procedure: BIOPSY;  Surgeon: Harvel Quale, MD;  [facility];  Service: Gastroenterology;;   CATARACT EXTRACTION Left 11/2018   ESOPHAGOGASTRODUODENOSCOPY (EGD) WITH PROPOFOL N/A 07/18/2021   Procedure: ESOPHAGOGASTRODUODENOSCOPY (EGD) WITH PROPOFOL;  Surgeon: Harvel Quale, MD;  [facility]: [facility];  Service: Gastroenterology;  Laterality: N/A;  12:00   INTRAVASCULAR PRESSURE WIRE/FFR STUDY N/A 06/01/2021   Procedure: INTRAVASCULAR PRESSURE WIRE/FFR STUDY;  Surgeon: Belva Crome, MD;  Location: [place] [facility];  Service: Cardiovascular;  Laterality: N/A;   LEFT HEART CATH AND CORONARY ANGIOGRAPHY N/A 09/25/2018   Procedure: LEFT HEART CATH AND CORONARY ANGIOGRAPHY;  Surgeon: Belva Crome, MD;  Location: [place] [facility];  Service: Cardiovascular;  Laterality: N/A;   LEFT HEART CATH  AND CORONARY ANGIOGRAPHY N/A 06/01/2021   Procedure: LEFT HEART CATH AND CORONARY ANGIOGRAPHY;  Surgeon: Belva Crome, MD;  Location: [place] [facility];  Service: Cardiovascular;  Laterality: N/A;   SAVORY DILATION  07/18/2021   Procedure: SAVORY DILATION;  Surgeon: Montez Morita, Quillian Quince, MD;  Location: [facility];  Service: Gastroenterology;;   TONSILLECTOMY  1954    Family History  Problem Relation Age of Onset   COPD Mother    Hearing loss Mother    Stroke Mother    Heart disease Mother        chf, died at 71   Arthritis Father    Hypertension Father    Heart  disease Father        cad, pvd, died at 64   Heart disease Sister        heart valve   Arthritis Sister    Hyperlipidemia Brother    Hypertension Brother    Cancer - Prostate Brother     Social History   Socioeconomic History   Marital status: Married    Spouse name: Al   Number of children: 0   Years of education: 18   Highest education level: Not on file  Occupational History   Occupation: Probation officer    Comment: from home  Tobacco Use   Smoking status: Former    Packs/day: 0.00    Types: Cigarettes    Start date: 11/12/1965    Quit date: 09/06/2008    Years since quitting: 12.9   Smokeless tobacco: Never  Vaping Use   Vaping Use: Never used  Substance and Sexual Activity   Alcohol use: Yes    Alcohol/week: 1.0 standard drink    Types: 1 Glasses of wine per week    Comment: occ   Drug use: Yes    Types: Marijuana    Comment: rarely   Sexual activity: Yes    Birth control/protection: Post-menopausal  Other Topics Concern   Not on file  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 Negative for chills and fever.  HENT:  Negative for congestion, sinus pressure, sinus pain and sore throat.   Eyes:  Negative for pain and discharge.  Respiratory:  Positive for shortness of breath. Negative for cough.   Cardiovascular:  Negative for chest pain and palpitations.  Gastrointestinal:  Negative for abdominal pain, diarrhea, nausea and vomiting.  Endocrine: Negative for polydipsia and polyuria.  Genitourinary:  Negative for dysuria and hematuria.  Musculoskeletal:  Negative for  neck pain and neck stiffness.  Skin:  Negative for rash.  Neurological:  Positive for numbness. Negative for dizziness and weakness.  Psychiatric/Behavioral:  Negative for agitation and behavioral problems. The patient is nervous/anxious.    Objective:   Today's Vitals: BP 119/71 (BP Location: Left Arm, Patient Position: Sitting, Cuff Size: Normal)   Pulse 65   Temp 98 F (36.7 C) (Oral)   Resp 18   Ht '5\' 4"'$  (1.626 m)   Wt 155 lb 0.6 oz (70.3 kg)   LMP 07/08/1979 (Approximate)  SpO2 97%   BMI 26.61 kg/m   Physical Exam Vitals reviewed.  Constitutional:      General: She is not in acute distress.    Appearance: She is not diaphoretic.  HENT:     Head: Normocephalic and atraumatic.     Nose: Nose normal.     Mouth/Throat:     Mouth: Mucous membranes are moist.  Eyes:     General: No scleral icterus.    Extraocular Movements: Extraocular movements intact.  Cardiovascular:     Rate and Rhythm: Normal rate and regular rhythm.     Pulses: Normal pulses.     Heart sounds: Normal heart sounds. No murmur heard. Pulmonary:     Breath sounds: Normal breath sounds. No wheezing or rales.  Abdominal:     Palpations: Abdomen is soft.     Tenderness: There is no abdominal tenderness.  Musculoskeletal:     Cervical back: Neck supple. No tenderness.     Right lower leg: No edema.     Left lower leg: No edema.  Skin:    General: Skin is warm.     Findings: No rash.  Neurological:     General: No focal deficit present.     Mental Status: She is alert and oriented to person, place, and time.  Psychiatric:        Mood and Affect: Mood normal.        Behavior: Behavior normal.    Assessment & Plan:   Problem List Items Addressed This Visit   Encounter to establish care Care established Previous chart reviewed History and medications reviewed with the patient  Post-COVID syndrome Fatigue, loss of smell sensation and dyspnea since COVID infection in 2021 Would like to see  post COVID syndrome clinic, referral provided  Statin intolerance Has history of HLD, did not tolerate 3 different statins -had leg cramps/myositis  Depression with anxiety Well-controlled with Wellbutrin and PRN Diazepam  HLD (hyperlipidemia) Did not tolerate statin Has been taking NP thyroid, followed by Hormone clinic - advised to avoid NP thyroid for HLD as she has h/o A Fib  Recurrent UTI (urinary tract infection) Keeps Macrobid, takes it PRN for UTI symptoms  Myofascial pain syndrome Followed by neurology Gets Botox injections  PAF (paroxysmal atrial fibrillation) ([place]) On Coumadin Not on any rate controlling agents In sinus rhythm currently  CAD (coronary artery disease) S/p stent placement Has history of Takotsubo cardiomyopathy Followed by cardiology On Coumadin for A. fib, did not tolerate statin in the past    Post-COVID syndrome       Relevant Orders   Ambulatory referral to Internal Medicine   Need for immunization against influenza       Relevant Orders   Flu Vaccine QUAD High Dose(Fluad) (Completed)       Outpatient Encounter Medications as of 07/31/2021  Medication Sig   Biotin 10000 MCG TABS Take 10,000 mcg by mouth daily at 12 noon.   buPROPion (WELLBUTRIN SR) 150 MG 12 hr tablet TAKE ONE TABLET BY MOUTH TWICE A DAY   Cholecalciferol (VITAMIN D3) 125 MCG (5000 UT) TABS Take 5,000 Units by mouth daily at 12 noon.   Coenzyme Q10 100 MG capsule Take 100 mg by mouth daily at 12 noon.   diazepam (VALIUM) 5 MG tablet Take 1 tablet (5 mg total) by mouth daily as needed for anxiety. (Patient taking differently: Take 5 mg by mouth daily as needed for anxiety (afib).)   docusate sodium (COLACE) 100 MG capsule  Take 100 mg by mouth 2 (two) times daily.    estradiol (ESTRACE) 0.5 MG tablet Take 1 tablet (0.5 mg total) by mouth daily.   estradiol (ESTRACE) 2 MG tablet Take 1 tablet (2 mg total) by mouth daily. (Patient taking differently: Take 3 mg by mouth  daily.)   nitrofurantoin, macrocrystal-monohydrate, (MACROBID) 100 MG capsule Take 1 capsule (100 mg total) by mouth 2 (two) times daily. (Patient taking differently: Take 100 mg by mouth daily as needed (cystitis flare).)   nitroGLYCERIN (NITROSTAT) 0.4 MG SL tablet Place 1 tablet under the tongue every 5 minutes as needed for chest pain.   oxyCODONE-acetaminophen (PERCOCET/ROXICET) 5-325 MG tablet TAKE 1 TABLET DAILY AS NEEDED FOR SEVERE PAIN. (Patient taking differently: Take 0.5 tablets by mouth 2 (two) times daily as needed for moderate pain.)   progesterone (PROMETRIUM) 200 MG capsule Take 1 capsule (200 mg total) by mouth daily.   warfarin (COUMADIN) 5 MG tablet Take one to one and one-half tablets by mouth daily or as directed. Resume warfarin today. Following bridging instructions if any. (Patient taking differently: Take 5-7.5 mg by mouth See admin instructions. Take 7.5 mg on Mon, Tues, Wed, Fri, and Sat. Take 5 mg on Thurs and Sun)   [DISCONTINUED] NP THYROID 90 MG tablet Take 1 tablet (90 mg total) by mouth daily before lunch.   esomeprazole (NEXIUM) 40 MG capsule Take 1 capsule (40 mg total) by mouth daily before breakfast. (Patient not taking: Reported on 07/31/2021)   [DISCONTINUED] NP THYROID 120 MG tablet Take 1 tablet (120 mg total) by mouth daily before breakfast. (Patient not taking: Reported on 07/31/2021)   No facility-administered encounter medications on file as of 07/31/2021.    Follow-up: Return in about 4 months (around 11/30/2021) for HLD and thyroid disorder.   Lindell Spar, MD

## 2021-07-31 NOTE — Assessment & Plan Note (Signed)
Fatigue, loss of smell sensation and dyspnea since COVID infection in 2021 Would like to see post COVID syndrome clinic, referral provided

## 2021-07-31 NOTE — Assessment & Plan Note (Signed)
Well-controlled with Wellbutrin and PRN Diazepam

## 2021-07-31 NOTE — Assessment & Plan Note (Signed)
Followed by neurology Gets Botox injections

## 2021-07-31 NOTE — Assessment & Plan Note (Signed)
Did not tolerate statin Has been taking NP thyroid, followed by Hormone clinic - advised to avoid NP thyroid for HLD as she has h/o A Fib

## 2021-07-31 NOTE — Assessment & Plan Note (Addendum)
Has history of HLD, did not tolerate 3 different statins -had leg cramps/myositis 

## 2021-07-31 NOTE — Assessment & Plan Note (Signed)
On Coumadin Not on any rate controlling agents In sinus rhythm currently

## 2021-07-31 NOTE — Assessment & Plan Note (Signed)
Keeps Macrobid, takes it PRN for UTI symptoms

## 2021-07-31 NOTE — Assessment & Plan Note (Signed)
S/p stent placement Has history of Takotsubo cardiomyopathy Followed by cardiology On Coumadin for A. fib, did not tolerate statin in the past 

## 2021-08-03 DIAGNOSIS — Z23 Encounter for immunization: Secondary | ICD-10-CM | POA: Diagnosis not present

## 2021-08-07 ENCOUNTER — Encounter: Payer: Self-pay | Admitting: Internal Medicine

## 2021-08-15 ENCOUNTER — Encounter: Payer: Self-pay | Admitting: Internal Medicine

## 2021-08-15 NOTE — Telephone Encounter (Signed)
Pt notified that after speaking with pharmacist about pt assistance program that this was not an ideal solution for our office as they required so many people to be signed up and wouldn't send just her shingles shot. Let her know I would leave paperwork upfront for her to pick up .

## 2021-08-16 ENCOUNTER — Institutional Professional Consult (permissible substitution): Payer: Medicare Other | Admitting: Internal Medicine

## 2021-08-17 ENCOUNTER — Telehealth: Payer: Self-pay | Admitting: Neurology

## 2021-08-17 ENCOUNTER — Encounter: Payer: Self-pay | Admitting: Internal Medicine

## 2021-08-17 MED ORDER — OXYCODONE-ACETAMINOPHEN 5-325 MG PO TABS: ORAL_TABLET | ORAL | 0 refills | Status: DC

## 2021-08-17 NOTE — Telephone Encounter (Signed)
I have routed this request to Dr Sater for review. The pt is due for the medication and [place] registry was verified.  

## 2021-08-17 NOTE — Telephone Encounter (Signed)
Pt is needing a refill request for her oxyCODONE-acetaminophen (PERCOCET/ROXICET) 5-325 MG tablet sent in to the [facility] on [address]

## 2021-08-21 ENCOUNTER — Other Ambulatory Visit: Payer: Self-pay | Admitting: Neurology

## 2021-08-21 NOTE — Telephone Encounter (Signed)
Pt has now decided to go with the pharmacy the medication was called into, this is FYI no call back is needed at this point

## 2021-08-21 NOTE — Telephone Encounter (Signed)
Patient called my phone and stated that she is wanting to know if she can get Botox earlier because she is in a lot of pain. She is leaving on 08/29/21 to go on a book tour and will be out of the state for awhile. She stated if Medicare doesn't pay for it she will.

## 2021-08-21 NOTE — Telephone Encounter (Signed)
Pt has called back because her medication got called into [facility] for a $90 amount where as at [facility] [phone] she is able to get the medication for around $10.00 please send to [place] [phone]

## 2021-08-21 NOTE — Telephone Encounter (Signed)
Noted, I left a voicemail on the patient phone informing her it has to be at least 90 days or longer to get her Botox.

## 2021-08-21 NOTE — Telephone Encounter (Signed)
Noted  

## 2021-08-21 NOTE — Telephone Encounter (Signed)
Unfortunetly, we can only do botox every 3 months. Last appt was 06/27/21. Has to be 90 days from this day at least

## 2021-08-23 NOTE — Telephone Encounter (Signed)
Patient called again today wanting to know if Dr. Felecia Shelling can prescribe her anything for the pain. She is aware she can not have Botox and is not due for another month.

## 2021-08-23 NOTE — Telephone Encounter (Signed)
Called the patient back.  Patient continues to struggle with the facial pain/spasms.  She has picked up her Percocet.  States the Percocet makes her dull and does not help much with the spasms.  She was prescribed tizanidine in which she became very drowsy when taking that medication making it hard to function.  Patient is going on a boat tour and will be speaking publicly and is needing something to help with the pain as well as not following her mind.  She has been doing research and upon research something consistently stated that may help with the spasms is Valium.  Advised that that Valium typically makes patients more drowsy than tizanidine does.  She states that she has taken it previously before for anxiety or MRIs and it never affected her the way the tizanidine did.  She continues to use heat and does stretches.  She is also already failed Flexeril.  The patient is open to any suggestions that Dr. Felecia Shelling may have that may help with this pain and discomfort.  The pharmacy [facility] in [place] is correct.  Advised the patient I would inform Dr. Felecia Shelling of these concerns and we will be in touch with what he recommends.  Patient verbalized understanding.

## 2021-08-24 ENCOUNTER — Other Ambulatory Visit: Payer: Self-pay

## 2021-08-24 ENCOUNTER — Ambulatory Visit (INDEPENDENT_AMBULATORY_CARE_PROVIDER_SITE_OTHER): Payer: Medicare Other | Admitting: *Deleted

## 2021-08-24 DIAGNOSIS — Z5181 Encounter for therapeutic drug level monitoring: Secondary | ICD-10-CM

## 2021-08-24 DIAGNOSIS — I480 Paroxysmal atrial fibrillation: Secondary | ICD-10-CM | POA: Diagnosis not present

## 2021-08-24 LAB — POCT INR: INR: 4.2 — AB (ref 2.0–3.0)

## 2021-08-24 NOTE — Patient Instructions (Signed)
Hold warfarin tonight then decrease dose to 1 tablet daily except 1 1/2 tablets on Tuesdays, Thursdays and Saturdays Recheck INR in 3 weeks.

## 2021-08-24 NOTE — Telephone Encounter (Signed)
Called the patient back and advised that Dr Felecia Shelling was ok with sending in the diazepam for her to the pharmacy for 30 tab with no refills. Advised the patient she should not take this medication along with the percocet. Pt verbalized understanding. Informed her I will send the request to Dr Felecia Shelling for him to sign off on. She was appreciative for the call back.

## 2021-08-24 NOTE — Addendum Note (Signed)
Addended by: Darleen Crocker on: 08/24/2021 08:23 AM   Modules accepted: Orders

## 2021-08-28 ENCOUNTER — Other Ambulatory Visit: Payer: Self-pay | Admitting: Neurology

## 2021-08-28 MED ORDER — DIAZEPAM 5 MG PO TABS: 5.0000 mg | ORAL_TABLET | Freq: Every day | ORAL | 0 refills | Status: DC | PRN

## 2021-09-04 ENCOUNTER — Other Ambulatory Visit: Payer: Self-pay | Admitting: *Deleted

## 2021-09-12 ENCOUNTER — Other Ambulatory Visit: Payer: Self-pay | Admitting: Cardiology

## 2021-09-13 ENCOUNTER — Other Ambulatory Visit: Payer: Self-pay

## 2021-09-13 ENCOUNTER — Ambulatory Visit (INDEPENDENT_AMBULATORY_CARE_PROVIDER_SITE_OTHER): Payer: Medicare Other

## 2021-09-13 DIAGNOSIS — Z0000 Encounter for general adult medical examination without abnormal findings: Secondary | ICD-10-CM | POA: Diagnosis not present

## 2021-09-13 NOTE — Patient Instructions (Signed)
Heather [name] , Thank you for taking time to come for your Medicare Wellness Visit. I appreciate your ongoing commitment to your health goals. Please review the following plan we discussed and let me know if I can assist you in the future.   Screening recommendations/referrals: Colonoscopy: complete Mammogram: complete Bone Density: complete Recommended yearly ophthalmology/optometry visit for glaucoma screening and checkup Recommended yearly dental visit for hygiene and checkup  Vaccinations: Influenza vaccine: complete Pneumococcal vaccine: complete Tdap vaccine: due now  Shingles vaccine: due now    Advanced directives: patient declined   Conditions/risks identified: CAD   Next appointment: 1 YEAR    Preventive Care 90 Years and Older, Female Preventive care refers to lifestyle choices and visits with your health care provider that can promote health and wellness. What does preventive care include? A yearly physical exam. This is also called an annual well check. Dental exams once or twice a year. Routine eye exams. Ask your health care provider how often you should have your eyes checked. Personal lifestyle choices, including: Daily care of your teeth and gums. Regular physical activity. Eating a healthy diet. Avoiding tobacco and drug use. Limiting alcohol use. Practicing safe sex. Taking low-dose aspirin every day. Taking vitamin and mineral supplements as recommended by your health care provider. What happens during an annual well check? The services and screenings done by your health care provider during your annual well check will depend on your age, overall health, lifestyle risk factors, and family history of disease. Counseling  Your health care provider may ask you questions about your: Alcohol use. Tobacco use. Drug use. Emotional well-being. Home and relationship well-being. Sexual activity. Eating habits. History of falls. Memory and ability to  understand (cognition). Work and work Statistician. Reproductive health. Screening  You may have the following tests or measurements: Height, weight, and BMI. Blood pressure. Lipid and cholesterol levels. These may be checked every 5 years, or more frequently if you are over 68 years old. Skin check. Lung cancer screening. You may have this screening every year starting at age 92 if you have a 30-pack-year history of smoking and currently smoke or have quit within the past 15 years. Fecal occult blood test (FOBT) of the stool. You may have this test every year starting at age 46. Flexible sigmoidoscopy or colonoscopy. You may have a sigmoidoscopy every 5 years or a colonoscopy every 10 years starting at age 5. Hepatitis C blood test. Hepatitis B blood test. Sexually transmitted disease (STD) testing. Diabetes screening. This is done by checking your blood sugar (glucose) after you have not eaten for a while (fasting). You may have this done every 1-3 years. Bone density scan. This is done to screen for osteoporosis. You may have this done starting at age 59. Mammogram. This may be done every 1-2 years. Talk to your health care provider about how often you should have regular mammograms. Talk with your health care provider about your test results, treatment options, and if necessary, the need for more tests. Vaccines  Your health care provider may recommend certain vaccines, such as: Influenza vaccine. This is recommended every year. Tetanus, diphtheria, and acellular pertussis (Tdap, Td) vaccine. You may need a Td booster every 10 years. Zoster vaccine. You may need this after age 66. Pneumococcal 13-valent conjugate (PCV13) vaccine. One dose is recommended after age 47. Pneumococcal polysaccharide (PPSV23) vaccine. One dose is recommended after age 32. Talk to your health care provider about which screenings and vaccines you need and how  often you need them. This information is not  intended to replace advice given to you by your health care provider. Make sure you discuss any questions you have with your health care provider. Document Released: 11/25/2015 Document Revised: 07/18/2016 Document Reviewed: 08/30/2015 Elsevier Interactive Patient Education  2017 [place] Prevention in the Home Falls can cause injuries. They can happen to people of all ages. There are many things you can do to make your home safe and to help prevent falls. What can I do on the outside of my home? Regularly fix the edges of walkways and driveways and fix any cracks. Remove anything that might make you trip as you walk through a door, such as a raised step or threshold. Trim any bushes or trees on the path to your home. Use bright outdoor lighting. Clear any walking paths of anything that might make someone trip, such as rocks or tools. Regularly check to see if handrails are loose or broken. Make sure that both sides of any steps have handrails. Any raised decks and porches should have guardrails on the edges. Have any leaves, snow, or ice cleared regularly. Use sand or salt on walking paths during winter. Clean up any spills in your garage right away. This includes oil or grease spills. What can I do in the bathroom? Use night lights. Install grab bars by the toilet and in the tub and shower. Do not use towel bars as grab bars. Use non-skid mats or decals in the tub or shower. If you need to sit down in the shower, use a plastic, non-slip stool. Keep the floor dry. Clean up any water that spills on the floor as soon as it happens. Remove soap buildup in the tub or shower regularly. Attach bath mats securely with double-sided non-slip rug tape. Do not have throw rugs and other things on the floor that can make you trip. What can I do in the bedroom? Use night lights. Make sure that you have a light by your bed that is easy to reach. Do not use any sheets or blankets that are  too big for your bed. They should not hang down onto the floor. Have a firm chair that has side arms. You can use this for support while you get dressed. Do not have throw rugs and other things on the floor that can make you trip. What can I do in the kitchen? Clean up any spills right away. Avoid walking on wet floors. Keep items that you use a lot in easy-to-reach places. If you need to reach something above you, use a strong step stool that has a grab bar. Keep electrical cords out of the way. Do not use floor polish or wax that makes floors slippery. If you must use wax, use non-skid floor wax. Do not have throw rugs and other things on the floor that can make you trip. What can I do with my stairs? Do not leave any items on the stairs. Make sure that there are handrails on both sides of the stairs and use them. Fix handrails that are broken or loose. Make sure that handrails are as long as the stairways. Check any carpeting to make sure that it is firmly attached to the stairs. Fix any carpet that is loose or worn. Avoid having throw rugs at the top or bottom of the stairs. If you do have throw rugs, attach them to the floor with carpet tape. Make sure that you have a  light switch at the top of the stairs and the bottom of the stairs. If you do not have them, ask someone to add them for you. What else can I do to help prevent falls? Wear shoes that: Do not have high heels. Have rubber bottoms. Are comfortable and fit you well. Are closed at the toe. Do not wear sandals. If you use a stepladder: Make sure that it is fully opened. Do not climb a closed stepladder. Make sure that both sides of the stepladder are locked into place. Ask someone to hold it for you, if possible. Clearly mark and make sure that you can see: Any grab bars or handrails. First and last steps. Where the edge of each step is. Use tools that help you move around (mobility aids) if they are needed. These  include: Canes. Walkers. Scooters. Crutches. Turn on the lights when you go into a dark area. Replace any light bulbs as soon as they burn out. Set up your furniture so you have a clear path. Avoid moving your furniture around. If any of your floors are uneven, fix them. If there are any pets around you, be aware of where they are. Review your medicines with your doctor. Some medicines can make you feel dizzy. This can increase your chance of falling. Ask your doctor what other things that you can do to help prevent falls. This information is not intended to replace advice given to you by your health care provider. Make sure you discuss any questions you have with your health care provider. Document Released: 08/25/2009 Document Revised: 04/05/2016 Document Reviewed: 12/03/2014 Elsevier Interactive Patient Education  2017 [employer].

## 2021-09-13 NOTE — Progress Notes (Signed)
re  Subjective:   Heather [name] is a 75 y.o. female who presents for Medicare Annual (Subsequent) preventive examination.  I connected with  Heather [name] on 09/13/21 by a audio enabled telemedicine application and verified that I am speaking with the correct person using two identifiers.   I discussed the limitations, risks, security and privacy concerns of performing an evaluation and management service by telephone and the availability of in person appointments. I also discussed with the patient that there may be a patient responsible charge related to this service. The patient expressed understanding and verbally consented to this telephonic visit.   Review of Systems           Objective:    There were no vitals filed for this visit. There is no height or weight on file to calculate BMI.  Advanced Directives 07/18/2021 07/13/2021 06/01/2021 05/15/2021 06/05/2020 03/07/2020 04/27/2019  Does Patient Have a Medical Advance Directive? No No No No No No No  Does patient want to make changes to medical advance directive? - - - - - - No - Patient declined  Would patient like information on creating a medical advance directive? No - Patient declined No - Patient declined No - Patient declined - No - Patient declined No - Patient declined -    Current Medications (verified) Outpatient Encounter Medications as of 09/13/2021  Medication Sig   Biotin 10000 MCG TABS Take 10,000 mcg by mouth daily at 12 noon.   buPROPion (WELLBUTRIN SR) 150 MG 12 hr tablet TAKE ONE TABLET BY MOUTH TWICE A DAY   Cholecalciferol (VITAMIN D3) 125 MCG (5000 UT) TABS Take 5,000 Units by mouth daily at 12 noon.   Coenzyme Q10 100 MG capsule Take 100 mg by mouth daily at 12 noon.   diazepam (VALIUM) 5 MG tablet Take 1 tablet (5 mg total) by mouth daily as needed for anxiety or muscle spasms.   docusate sodium (COLACE) 100 MG capsule Take 100 mg by mouth 2 (two) times daily.    esomeprazole (NEXIUM) 40 MG capsule Take 1 capsule  (40 mg total) by mouth daily before breakfast. (Patient not taking: Reported on 07/31/2021)   estradiol (ESTRACE) 0.5 MG tablet Take 1 tablet (0.5 mg total) by mouth daily.   estradiol (ESTRACE) 2 MG tablet Take 1 tablet (2 mg total) by mouth daily. (Patient taking differently: Take 3 mg by mouth daily.)   nitrofurantoin, macrocrystal-monohydrate, (MACROBID) 100 MG capsule Take 1 capsule (100 mg total) by mouth 2 (two) times daily. (Patient taking differently: Take 100 mg by mouth daily as needed (cystitis flare).)   nitroGLYCERIN (NITROSTAT) 0.4 MG SL tablet Place 1 tablet under the tongue every 5 minutes as needed for chest pain.   oxyCODONE-acetaminophen (PERCOCET/ROXICET) 5-325 MG tablet TAKE 1 TABLET DAILY AS NEEDED FOR SEVERE PAIN.   progesterone (PROMETRIUM) 200 MG capsule Take 1 capsule (200 mg total) by mouth daily.   warfarin (COUMADIN) 5 MG tablet Take one to one and one-half tablets by mouth daily or as directed. Resume warfarin today. Following bridging instructions if any. (Patient taking differently: Take 5-7.5 mg by mouth See admin instructions. Take 7.5 mg on Mon, Tues, Wed, Fri, and Sat. Take 5 mg on Thurs and Sun)   No facility-administered encounter medications on file as of 09/13/2021.    Allergies (verified) Statins, Other, and Sulfa antibiotics   History: Past Medical History:  Diagnosis Date   Adrenal adenoma    Allergy    Anemia    Anxiety  CAD (coronary artery disease)    a. 50-60% mid LAD at cardiac catheterization 2014 [facility]) b. non-obstructive CAD by cath in 09/2018   Cataract    Depression    DJD (degenerative joint disease) of cervical spine 09/12/2016   H. pylori infection 07/19/2017   History of cardiomyopathy    History of COVID-19 12/10/2019   History of tobacco use    Hyperlipidemia    Mid back pain 02/08/2017   Neuromuscular disorder (HCC)    PAF (paroxysmal atrial fibrillation) ([place])    Diagnosis July 2017 - spontaneously resolved   Smell  disturbance 06/13/2021   Statin intolerance    Past Surgical History:  Procedure Laterality Date   ABDOMINAL HYSTERECTOMY  1980   endometriosis   BIOPSY  07/18/2021   Procedure: BIOPSY;  Surgeon: Heather Quale, MD;  [facility];  Service: Gastroenterology;;   CATARACT EXTRACTION Left 11/2018   ESOPHAGOGASTRODUODENOSCOPY (EGD) WITH PROPOFOL N/A 07/18/2021   Procedure: ESOPHAGOGASTRODUODENOSCOPY (EGD) WITH PROPOFOL;  Surgeon: Heather Quale, MD;  Location[facility];  Service: Gastroenterology;  Laterality: N/A;  12:00   INTRAVASCULAR PRESSURE WIRE/FFR STUDY N/A 06/01/2021   Procedure: INTRAVASCULAR PRESSURE WIRE/FFR STUDY;  Surgeon: Heather Crome, MD;  Location: [place] [facility];  Service: Cardiovascular;  Laterality: N/A;   LEFT HEART CATH AND CORONARY ANGIOGRAPHY N/A 09/25/2018   Procedure: LEFT HEART CATH AND CORONARY ANGIOGRAPHY;  Surgeon: Heather Crome, MD;  Location: [place] [facility];  Service: Cardiovascular;  Laterality: N/A;   LEFT HEART CATH AND CORONARY ANGIOGRAPHY N/A 06/01/2021   Procedure: LEFT HEART CATH AND CORONARY ANGIOGRAPHY;  Surgeon: Heather Crome, MD;  Location: [place] [facility];  Service: Cardiovascular;  Laterality: N/A;   SAVORY DILATION  07/18/2021   Procedure: SAVORY DILATION;  Surgeon: Heather [name], Heather Quince, MD;  [facility];  Service: Gastroenterology;;   TONSILLECTOMY  1954   Family History  Problem Relation Age of Onset   COPD Mother    Hearing loss Mother    Stroke Mother    Heart disease Mother        chf, died at 70   Arthritis Father    Hypertension Father    Heart disease Father        cad, pvd, died at 76   Heart disease Sister        heart valve   Arthritis Sister    Hyperlipidemia Brother    Hypertension Brother    Cancer - Prostate Brother    Social History   Socioeconomic History   Marital status: Married    Spouse name: Heather [name]   Number of children: 0   Years of education: 18    Highest education level: Not on file  Occupational History   Occupation: Probation officer    Comment: from home  Tobacco Use   Smoking status: Former    Packs/day: 0.00    Types: Cigarettes    Start date: 11/12/1965    Quit date: 09/06/2008    Years since quitting: 13.0   Smokeless tobacco: Never  Vaping Use   Vaping Use: Never used  Substance and Sexual Activity   Alcohol use: Yes    Alcohol/week: 1.0 standard drink    Types: 1 Glasses of wine per week    Comment: occ   Drug use: Yes    Types: Marijuana    Comment: rarely   Sexual activity: Yes    Birth control/protection: Post-menopausal  Other Topics Concern  Not on file  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 N N  Vision? N Y  Difficulty concentrating or making decisions? N N  Walking or climbing stairs? N N  Dressing or bathing? N N  Doing errands, shopping? N N  Some recent data might be hidden    Patient Care Team: Lindell Spar, MD as PCP - General (Internal Medicine) Satira Sark, MD as PCP - Cardiology (Cardiology)  Indicate any recent Medical Services you may have received from other than [facility] providers in the past year (date may be approximate).     Assessment:   This is a routine wellness examination for Adjoa.  Hearing/Vision screen No results found.  Dietary issues and exercise activities discussed:     Goals  Addressed   None   Depression Screen PHQ 2/9 Scores 07/31/2021 04/27/2021 03/31/2020 12/14/2019 12/16/2017 09/24/2017 03/07/2017  PHQ - 2 Score 1 0 0 0 0 0 0  Exception Documentation - - Medical reason - - - -    Fall Risk Fall Risk  07/31/2021 04/27/2021 03/31/2020 12/14/2019 12/16/2017  Falls in the past year? 0 0 0 0 No  Number falls in past yr: 0 0 0 0 -  Injury with Fall? 0 0 0 0 -  Risk for fall due to : No Fall Risks No Fall Risks No Fall Risks - -  Follow up Falls evaluation completed Falls evaluation completed;Education provided;Falls prevention discussed Falls evaluation completed - -    FALL RISK PREVENTION PERTAINING TO THE HOME:  Any stairs in or around the home? Yes  If so, are there any without handrails? Yes  Home free of loose throw rugs in walkways, pet beds, electrical cords, etc? Yes  Adequate lighting in your home to reduce risk of falls? Yes   ASSISTIVE DEVICES UTILIZED TO PREVENT FALLS:  Life alert? No  Use of a cane, walker or w/c? No  Grab bars in the bathroom? Yes  Shower chair or bench in shower? No  Elevated toilet seat or a handicapped toilet? No   TIMED UP AND GO:  Was the test performed? No .  Length of time to ambulate 10 feet: N/A sec.     Cognitive Function:   Montreal Cognitive Assessment  09/14/2020  Visuospatial/ Executive (0/5) 3  Naming (0/3) 3  Attention: Read list of digits (0/2) 2  Attention: Read list of letters (0/1) 1  Attention: Serial 7 subtraction starting at 100 (0/3) 3  Language: Repeat phrase (0/2) 2  Language : Fluency (0/1) 1  Abstraction (0/2) 2  Delayed Recall (0/5) 5  Orientation (0/6) 6  Total 28  Adjusted Score (based on education) 28      Immunizations Immunization History  Administered Date(s) Administered   Fluad Quad(high Dose 65+) 08/17/2019, 09/13/2020,  07/31/2021   Influenza, High Dose Seasonal PF 08/30/2017   Moderna SARS-COV2 Booster Vaccination 02/24/2021   Moderna Sars-Covid-2 Vaccination 09/22/2020    PFIZER(Purple Top)SARS-COV-2 Vaccination 01/25/2020, 03/18/2020   Pneumococcal Conjugate-13 12/16/2017   Pneumococcal Polysaccharide-23 11/04/2019    TDAP status: Due, Education has been provided regarding the importance of this vaccine. Advised may receive this vaccine at local pharmacy or Health Dept. Aware to provide a copy of the vaccination record if obtained from local pharmacy or Health Dept. Verbalized acceptance and understanding.  Flu Vaccine status: Up to date  Pneumococcal vaccine status: Up to date  Covid-19 vaccine status: Completed vaccines  Qualifies for Shingles Vaccine? Yes   Zostavax completed No   Shingrix Completed?: No.    Education has been provided regarding the importance of this vaccine. Patient has been advised to call insurance company to determine out of pocket expense if they have not yet received this vaccine. Advised may also receive vaccine at local pharmacy or Health Dept. Verbalized acceptance and understanding.  Screening Tests Health Maintenance  Topic Date Due   TETANUS/TDAP  Never done   Zoster Vaccines- Shingrix (1 of 2) Never done   COVID-19 Vaccine (4 - Booster for Pfizer series) 04/21/2021   COLONOSCOPY (Pts 45-71yrs Insurance coverage will need to be confirmed)  06/11/2027   Pneumonia Vaccine 21+ Years old  Completed   INFLUENZA VACCINE  Completed   DEXA SCAN  Completed   Hepatitis C Screening  Completed   HPV VACCINES  Aged Out    Health Maintenance  Health Maintenance Due  Topic Date Due   TETANUS/TDAP  Never done   Zoster Vaccines- Shingrix (1 of 2) Never done   COVID-19 Vaccine (4 - Booster for Pfizer series) 04/21/2021    Colorectal cancer screening: Type of screening: Colonoscopy. Completed 06/10/2017. Repeat every 10 years  Mammogram status: Completed 06/2021. Repeat every year  Bone Density status: Completed 11/20/2018. Results reflect: Bone density results: NORMAL. Repeat every 5 years.  Lung Cancer Screening: (Low  Dose CT Chest recommended if Age 33-80 years, 30 pack-year currently smoking OR have quit w/in 15years.) does not qualify.   Lung Cancer Screening Referral:   Additional Screening:  Hepatitis C Screening: does qualify; Completed 04/28/2019  Vision Screening: Recommended annual ophthalmology exams for early detection of glaucoma and other disorders of the eye. Is the patient up to date with their annual eye exam?  Yes  Who is the provider or what is the name of the office in which the patient attends annual eye exams? [facility] If pt is not established with a provider, would they like to be referred to a provider to establish care? No .   Dental Screening: Recommended annual dental exams for proper oral hygiene  Community Resource Referral / Chronic Care Management: CRR required this visit?  No   CCM required this visit?  No      Plan:     I have personally reviewed and noted the following in the patient's chart:   Medical and social history Use of alcohol, tobacco or illicit drugs  Current medications and supplements including opioid prescriptions.  Functional ability and status Nutritional status Physical activity Advanced directives List of other physicians Hospitalizations, surgeries, and ER visits in previous 12 months Vitals Screenings to include cognitive, depression, and falls Referrals and appointments  In addition, I have reviewed and discussed with patient certain preventive protocols, quality metrics, and best practice recommendations. A written personalized care plan for preventive services as well  as general preventive health recommendations were provided to patient.     Quentin Angst, [place]   09/13/2021   Nurse Notes: This is a tele health visit with the patient at home. The provider is in the office and is [employer]

## 2021-09-14 ENCOUNTER — Ambulatory Visit: Payer: 59 | Admitting: Cardiology

## 2021-09-19 ENCOUNTER — Ambulatory Visit (INDEPENDENT_AMBULATORY_CARE_PROVIDER_SITE_OTHER): Payer: Medicare Other | Admitting: *Deleted

## 2021-09-19 DIAGNOSIS — I480 Paroxysmal atrial fibrillation: Secondary | ICD-10-CM | POA: Diagnosis not present

## 2021-09-19 DIAGNOSIS — Z5181 Encounter for therapeutic drug level monitoring: Secondary | ICD-10-CM

## 2021-09-19 DIAGNOSIS — E039 Hypothyroidism, unspecified: Secondary | ICD-10-CM | POA: Diagnosis not present

## 2021-09-19 DIAGNOSIS — N951 Menopausal and female climacteric states: Secondary | ICD-10-CM | POA: Diagnosis not present

## 2021-09-19 LAB — POCT INR: INR: 2.3 (ref 2.0–3.0)

## 2021-09-19 NOTE — Patient Instructions (Signed)
Continue warfarin 1 tablet daily except 1 1/2 tablets on Tuesdays, Thursdays and Saturdays Recheck INR in 4 weeks.

## 2021-09-25 DIAGNOSIS — R5383 Other fatigue: Secondary | ICD-10-CM | POA: Diagnosis not present

## 2021-09-25 DIAGNOSIS — N898 Other specified noninflammatory disorders of vagina: Secondary | ICD-10-CM | POA: Diagnosis not present

## 2021-09-25 DIAGNOSIS — E039 Hypothyroidism, unspecified: Secondary | ICD-10-CM | POA: Diagnosis not present

## 2021-09-25 DIAGNOSIS — N951 Menopausal and female climacteric states: Secondary | ICD-10-CM | POA: Diagnosis not present

## 2021-09-25 DIAGNOSIS — Z6824 Body mass index (BMI) 24.0-24.9, adult: Secondary | ICD-10-CM | POA: Diagnosis not present

## 2021-09-25 DIAGNOSIS — E7800 Pure hypercholesterolemia, unspecified: Secondary | ICD-10-CM | POA: Diagnosis not present

## 2021-10-03 ENCOUNTER — Other Ambulatory Visit: Payer: Self-pay

## 2021-10-03 ENCOUNTER — Encounter: Payer: Self-pay | Admitting: Neurology

## 2021-10-03 ENCOUNTER — Ambulatory Visit (INDEPENDENT_AMBULATORY_CARE_PROVIDER_SITE_OTHER): Payer: Medicare Other | Admitting: Neurology

## 2021-10-03 DIAGNOSIS — R419 Unspecified symptoms and signs involving cognitive functions and awareness: Secondary | ICD-10-CM

## 2021-10-03 DIAGNOSIS — I10 Essential (primary) hypertension: Secondary | ICD-10-CM | POA: Diagnosis not present

## 2021-10-03 DIAGNOSIS — M542 Cervicalgia: Secondary | ICD-10-CM

## 2021-10-03 DIAGNOSIS — M62838 Other muscle spasm: Secondary | ICD-10-CM

## 2021-10-03 DIAGNOSIS — R52 Pain, unspecified: Secondary | ICD-10-CM | POA: Diagnosis not present

## 2021-10-03 DIAGNOSIS — M7918 Myalgia, other site: Secondary | ICD-10-CM | POA: Diagnosis not present

## 2021-10-03 DIAGNOSIS — I25119 Atherosclerotic heart disease of native coronary artery with unspecified angina pectoris: Secondary | ICD-10-CM

## 2021-10-03 NOTE — Progress Notes (Signed)
[facility]  PATIENT: Heather [name] DOB: [birth year]  REFERRING DOCTOR OR PCP:  Blanchie Serve SOURCE: patient  _________________________________   HISTORICAL  CHIEF COMPLAINT:  Chief Complaint  Patient presents with   Procedure    Rm 16, alone. Here for Botox. Botox- 100 units x 2 vials Lot: [id number] Expiration: 02/25 NDC: 1324-4010-27  0.9% Sodium Chloride- 18mL total Lot: [id number] Expiration: 12/23 NDC: 03474-259-56  Dx: M79.18 B/B      HISTORY OF PRESENT ILLNESS:  Heather [name] is a 75 y.o. woman who has had chronic right-sided mid back pain since 2013.      Update 10/03/2021 She gets a benefit with Botox injections for the myofascial pain in the right trunk/back.  She did well x 7-8 weeks then had a severe spasm and more pain the last 4 weeks.   Valium hleped her sleep but did not help the spasm.     She also has taken rare 1/2 percocet if pain is severe (just a fe a month)  She has rare insomnia when pain is worse and will rarely take diazepam (usually just one or two a month).  She never combines with a percocet.   She also reports more fatigue.  Cognition is doing ok.       She was experiencing left neck discomfort and abnormal sensation in the left side for a month this summer.   This is better now.   She still has altered smell ad taste.        No new weakness.  She does have some back and leg pain more on the right.    Lumbar MRI in 2014 showed DJD at L3-L4 with spondylolisthesis and possible effects on L3 nerve roots.    She had been worried about STM in 2021but feels this is less of an issue now.   She scored 28/30 on the [facility] cognitive assessment last year .  She has recently written a book (infection novel).    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    She is on coumadin for atrial fibrillation.       IMAGING:  MRI results from 10/14/2013: The MRI of the brain showed age related atrophy and minimal chronic  microvascular ischemic change. MRI of the cervical spine showed multilevel mild degenerative changes with left paramedian disc herniation at C3-C4 and left disc protrusion at C4-C5 and C5-C6 and midline disc herniation at C6-C7. There was no report of nerve root compression. MRI of the thoracic spine showed disc desiccation but no herniation or protrusions. MRI of the lumbar spine showed disc bulges at T12-L1 and L2-L3 and disc bulge with facet hypertrophy at L3-L4 and disc bulge with right foraminal annular tear at L4-L5.  MRI brain 7/4/2-22 was personally reviewed.  It shows mild generalized atrophy.  No acute findings.     REVIEW OF SYSTEMS: Constitutional: No fevers, chills, sweats, or change in appetite Eyes: No visual changes, double vision, eye pain Ear, nose and throat: No hearing loss, ear pain, nasal congestion, sore throat Cardiovascular: No chest pain, palpitations.   She has atrial fibrillation and is on warfarin Respiratory:  No shortness of breath at rest or with exertion.   No wheezes GastrointestinaI: No nausea, vomiting, diarrhea, abdominal pain, fecal incontinence Genitourinary:  No dysuria, urinary retention or frequency.  No nocturia. Musculoskeletal:  No neck pain.  She has back pain/thoracic pain as above Integumentary: No rash, pruritus, skin lesions Neurological: as above Psychiatric: No depression  at this time.  No anxiety Endocrine: No palpitations, diaphoresis, change in appetite, change in weigh or increased thirst Hematologic/Lymphatic:  No anemia, purpura, petechiae. Allergic/Immunologic: No itchy/runny eyes, nasal congestion, recent allergic reactions, rashes  ALLERGIES: Allergies  Allergen Reactions   Statins Other (See Comments)    Muscle weakness   Other     EKG pads causes blisters    Sulfa Antibiotics Rash    HOME MEDICATIONS:  Current Outpatient Medications:    Biotin 10000 MCG TABS, Take 10,000 mcg by mouth daily at 12 noon., Disp: , Rfl:     buPROPion (WELLBUTRIN SR) 150 MG 12 hr tablet, TAKE ONE TABLET BY MOUTH TWICE A DAY, Disp: 180 tablet, Rfl: 1   Cholecalciferol (VITAMIN D3) 125 MCG (5000 UT) TABS, Take 5,000 Units by mouth daily at 12 noon., Disp: , Rfl:    Coenzyme Q10 100 MG capsule, Take 100 mg by mouth daily at 12 noon., Disp: , Rfl:    diazepam (VALIUM) 5 MG tablet, Take 1 tablet (5 mg total) by mouth daily as needed for anxiety or muscle spasms., Disp: 30 tablet, Rfl: 0   docusate sodium (COLACE) 100 MG capsule, Take 100 mg by mouth 2 (two) times daily. , Disp: , Rfl:    esomeprazole (NEXIUM) 40 MG capsule, Take 1 capsule (40 mg total) by mouth daily before breakfast., Disp: 30 capsule, Rfl: 2   estradiol (ESTRACE) 0.5 MG tablet, Take 1 tablet (0.5 mg total) by mouth daily., Disp: 90 tablet, Rfl: 1   estradiol (ESTRACE) 2 MG tablet, Take 1 tablet (2 mg total) by mouth daily. (Patient taking differently: Take 3 mg by mouth daily.), Disp: 90 tablet, Rfl: 1   nitrofurantoin, macrocrystal-monohydrate, (MACROBID) 100 MG capsule, Take 1 capsule (100 mg total) by mouth 2 (two) times daily. (Patient taking differently: Take 100 mg by mouth daily as needed (cystitis flare).), Disp: 60 capsule, Rfl: 0   nitroGLYCERIN (NITROSTAT) 0.4 MG SL tablet, Place 1 tablet under the tongue every 5 minutes as needed for chest pain., Disp: 25 tablet, Rfl: 1   oxyCODONE-acetaminophen (PERCOCET/ROXICET) 5-325 MG tablet, TAKE 1 TABLET DAILY AS NEEDED FOR SEVERE PAIN., Disp: 30 tablet, Rfl: 0   progesterone (PROMETRIUM) 200 MG capsule, Take 1 capsule (200 mg total) by mouth daily., Disp: 90 capsule, Rfl: 1   warfarin (COUMADIN) 5 MG tablet, Take one to one and one-half tablets by mouth daily or as directed. Resume warfarin today. Following bridging instructions if any. (Patient taking differently: Take 5-7.5 mg by mouth See admin instructions. Take 7.5 mg on Mon, Tues, Wed, Fri, and Sat. Take 5 mg on Thurs and Sun), Disp: 135 tablet, Rfl: 1  PAST MEDICAL  HISTORY: Past Medical History:  Diagnosis Date   Adrenal adenoma    Allergy    Anemia    Anxiety    CAD (coronary artery disease)    a. 50-60% mid LAD at cardiac catheterization 2014 [facility]) b. non-obstructive CAD by cath in 09/2018   Cataract    Depression    DJD (degenerative joint disease) of cervical spine 09/12/2016   H. pylori infection 07/19/2017   History of cardiomyopathy    History of COVID-19 12/10/2019   History of tobacco use    Hyperlipidemia    Mid back pain 02/08/2017   Neuromuscular disorder (HCC)    PAF (paroxysmal atrial fibrillation) ([place])    Diagnosis July 2017 - spontaneously resolved   Smell disturbance 06/13/2021   Statin intolerance     PAST SURGICAL  HISTORY: Past Surgical History:  Procedure Laterality Date   ABDOMINAL HYSTERECTOMY  1980   endometriosis   BIOPSY  07/18/2021   Procedure: BIOPSY;  Surgeon: Harvel Quale, MD;  [facility];  Service: Gastroenterology;;   CATARACT EXTRACTION Left 11/2018   ESOPHAGOGASTRODUODENOSCOPY (EGD) WITH PROPOFOL N/A 07/18/2021   Procedure: ESOPHAGOGASTRODUODENOSCOPY (EGD) WITH PROPOFOL;  Surgeon: Harvel Quale, MD;  [facility]: [facility];  Service: Gastroenterology;  Laterality: N/A;  12:00   INTRAVASCULAR PRESSURE WIRE/FFR STUDY N/A 06/01/2021   Procedure: INTRAVASCULAR PRESSURE WIRE/FFR STUDY;  Surgeon: Belva Crome, MD;  Location: [place] [facility];  Service: Cardiovascular;  Laterality: N/A;   LEFT HEART CATH AND CORONARY ANGIOGRAPHY N/A 09/25/2018   Procedure: LEFT HEART CATH AND CORONARY ANGIOGRAPHY;  Surgeon: Belva Crome, MD;  Location: [place] [facility];  Service: Cardiovascular;  Laterality: N/A;   LEFT HEART CATH AND CORONARY ANGIOGRAPHY N/A 06/01/2021   Procedure: LEFT HEART CATH AND CORONARY ANGIOGRAPHY;  Surgeon: Belva Crome, MD;  Location: [place] [facility];  Service: Cardiovascular;  Laterality: N/A;   SAVORY DILATION  07/18/2021   Procedure: SAVORY  DILATION;  Surgeon: Montez Morita, Quillian Quince, MD;  Location: [facility];  Service: Gastroenterology;;   TONSILLECTOMY  1954    FAMILY HISTORY: Family History  Problem Relation Age of Onset   COPD Mother    Hearing loss Mother    Stroke Mother    Heart disease Mother        chf, died at 82   Arthritis Father    Hypertension Father    Heart disease Father        cad, pvd, died at 69   Heart disease Sister        heart valve   Arthritis Sister    Hyperlipidemia Brother    Hypertension Brother    Cancer - Prostate Brother     SOCIAL HISTORY:  Social History   Socioeconomic History   Marital status: Married    Spouse name: Al   Number of children: 0   Years of education: 18   Highest education level: Not on file  Occupational History   Occupation: Probation officer    Comment: from home  Tobacco Use   Smoking status: Former    Packs/day: 0.00    Types: Cigarettes    Start date: 11/12/1965    Quit date: 09/06/2008    Years since quitting: 13.0   Smokeless tobacco: Never  Vaping Use   Vaping Use: Never used  Substance and Sexual Activity   Alcohol use: Yes    Alcohol/week: 1.0 standard drink    Types: 1 Glasses of wine per week    Comment: occ   Drug use: Yes    Types: Marijuana    Comment: rarely   Sexual activity: Yes    Birth control/protection: Post-menopausal  Other Topics Concern   Not on file  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   Lives with husband AL   No children   Healthy diet and lifestyle   Social Determinants of Health   Financial Resource Strain: Low Risk    Difficulty of Paying Living Expenses: Not hard at all  Food Insecurity: No Food Insecurity   Worried About Charity fundraiser in the Last Year: Never true   Ran Out of Food in the Last Year: Never true  Transportation Needs: No Transportation Needs   Lack of Transportation (Medical): No   Lack of Transportation (  Non-Medical): No  Physical Activity: Insufficiently Active   Days of  Exercise per Week: 3 days   Minutes of Exercise per Session: 30 min  Stress: No Stress Concern Present   Feeling of Stress : Only a little  Social Connections: Moderately Isolated   Frequency of Communication with Friends and Family: More than three times a week   Frequency of Social Gatherings with Friends and Family: Twice a week   Attends Religious Services: Never   Marine scientist or Organizations: No   Attends Music therapist: Never   Marital Status: Married  Human resources officer Violence: Not At Risk   Fear of Current or Ex-Partner: No   Emotionally Abused: No   Physically Abused: No   Sexually Abused: No     PHYSICAL EXAM  There were no vitals filed for this visit.   There is no height or weight on file to calculate BMI.   General: The patient is well-developed and well-nourished and in no acute distress.  Head is normocephalic and atraumatic.   Musculoskeletal : She notes tenderness in the mid to lower thoracic intracoastal region from T7-T9 on the right with lesser tenderness in the paraspinal region.  The neck was nontender today.  Neurologic Exam  Mental status: The patient is alert and oriented x 3 at the time of the examination.  She scored 28/30 on the [facility] (details above).  Focus and attention seem normal.   Speech is normal.  Cranial nerves: Extraocular muscles are intact.  Facial strength is normal.   . No obvious hearing deficits are noted.  Motor: She has normal muscle tone, muscle bulk and muscle strength in the arms or legs.  gait and station: Gait and station are normal.  Romberg negative.     ASSESSMENT AND PLAN  1. Myofascial pain syndrome   2. Neck pain   3. Cognitive complaints   4. Muscle spasm       1.  Inject 100 units Botox split into tender points below the T6-T9 ribs into the intercostal muscles near the midclavicular line.  There were no complications and she tolerated the injections well.    2.   Stay active and  exercise as tolerated. 3.   She will return in 3-4 months for next Botox or sooner if  new or worsening neurologic symptoms.   Heather [name] A. Felecia Shelling, MD, PhD 26/20/3559, 7:41 PM Certified in Neurology, Clinical Neurophysiology, Sleep Medicine, Pain Medicine and Neuroimaging  [facility] [address] [place], [place] [address] [phone]

## 2021-10-04 ENCOUNTER — Telehealth: Payer: Self-pay | Admitting: Neurology

## 2021-10-04 NOTE — Telephone Encounter (Signed)
Pt called states she's had a very unusual reaction to her BOTOX injection that she received on yesterday. Pt states she had to call the EMT last night. Pt requesting a call back.

## 2021-10-04 NOTE — Telephone Encounter (Signed)
Called and spoke w/ pt. About 10p last night, she went to bathroom to get reach for bed. Bent over and couldn't get up d/t severe pain. Felt like there was a vice/grip around her back. Pain radiated up to neck. Occurred on same side she gets inj. She ended up crawling to living room/cried out in pain. Relates pain to severity of child birth. Husband called 64. She had to go back to holding bed post w/ both hands. Took valium at this time. EMT came and did eval. BP elevated but otherwise ok. Episode lasted for about 40 min. They did not think she needed go to the hospital. About 2/3 through 40 min episode, she was able to let go of bed post w/ one hand. Sister mentioned she had glass of wine w/ dinner. Adverse effect w/botox? Advised this should not interfere. She states it felt like giant muscle spasm. Today, if she takes deep breathe, she has discomfort in chest on right side. Has tightness in throat. Did not take any other new medication yesterday. She just wanted  Dr. Felecia Shelling to be aware as this has never occurred post botox inj before. Not urgent.   Recommend she call 911 or proceed to ER if sx return/become severe again for immediate eval/treatment. She verbalized understanding and appreciation.

## 2021-10-05 ENCOUNTER — Encounter: Payer: Self-pay | Admitting: Neurology

## 2021-10-09 ENCOUNTER — Other Ambulatory Visit: Payer: Self-pay

## 2021-10-09 ENCOUNTER — Ambulatory Visit (INDEPENDENT_AMBULATORY_CARE_PROVIDER_SITE_OTHER): Payer: Medicare Other | Admitting: Internal Medicine

## 2021-10-09 ENCOUNTER — Encounter: Payer: Self-pay | Admitting: Internal Medicine

## 2021-10-09 DIAGNOSIS — R0609 Other forms of dyspnea: Secondary | ICD-10-CM | POA: Diagnosis not present

## 2021-10-09 DIAGNOSIS — M542 Cervicalgia: Secondary | ICD-10-CM | POA: Diagnosis not present

## 2021-10-09 DIAGNOSIS — I25119 Atherosclerotic heart disease of native coronary artery with unspecified angina pectoris: Secondary | ICD-10-CM | POA: Diagnosis not present

## 2021-10-09 MED ORDER — FAMOTIDINE 20 MG PO TABS: ORAL_TABLET | ORAL | 11 refills | Status: DC

## 2021-10-09 NOTE — Patient Instructions (Addendum)
Discuss risk / benefit of progesterone iwith  your primary care doctor   Same for macrobid   Best way to monitor your lung function is with pulse oximetry at peak exercise (not afterwards)   I strongly recommend nexium 40 mg Take 30-60 min before first meal of the day and pepcid 20 mg after supper or at bedtime until you are seen by ENT   GERD (REFLUX)  is an extremely common cause of respiratory symptoms just like yours , many times with no obvious heartburn at all.    It can be treated with medication, but also with lifestyle changes including elevation of the head of your bed (ideally with 6 -8inch blocks under the headboard of your bed),  Smoking cessation, avoidance of late meals, excessive alcohol, and avoid fatty foods, chocolate, peppermint, colas, red wine, and acidic juices such as orange juice.  NO MINT OR MENTHOL PRODUCTS SO NO COUGH DROPS  USE SUGARLESS CANDY INSTEAD (Jolley ranchers or Stover's or Life Savers) or even ice chips will also do - the key is to swallow to prevent all throat clearing. NO OIL BASED VITAMINS - use powdered substitutes.  Avoid fish oil when coughing.   We will be referring you to Dr Hilbert Odor re your sensation of something stuck in your throat.

## 2021-10-09 NOTE — Progress Notes (Signed)
Heather [name], female    DOB: 08/29/1946   MRN: [id number]   Brief patient profile:  2 yowf quit smoking 2006 referred to pulmonary clinic in [place]  10/09/2021 by  Dr Posey Pronto for spells of sob onset spring 2022 in setting of globus x 2017 with finding on EGD =  1 cm HH s/p stretching 07/18/21/ pt declined to take nexium "I don't have reflux"    Had covid Jan 2021 s obvious squelae    History of Present Illness  10/09/2021  Pulmonary/ 1st office eval/ Amalio Loe / [facility] / Macrobid exposure  Chief Complaint  Patient presents with   Consult    SOB has been going on for a while.   Dyspnea:  weekend prior to [place] 4 [place]/ also did gxt  for myoview 05/22/21 she says did not cause sob or desats though record says sob and no documentation of ex sats Cough: none  Sleep: able to lie flat s spells now  SABA use: none  Sensation of globus 24/7 worse L side of throat.   No obvious day to day or daytime variability or assoc excess/ purulent sputum or mucus plugs or hemoptysis or cp or chest tightness, subjective wheeze or overt sinus or hb symptoms.   Sleeping  without nocturnal  or early am exacerbation  of respiratory  c/o's or need for noct saba. Also denies any obvious fluctuation of symptoms with weather or environmental changes or other aggravating or alleviating factors except as outlined above   No unusual exposure hx or h/o childhood pna/ asthma or knowledge of premature birth.  Current Allergies, Complete Past Medical History, Past Surgical History, Family History, and Social History were reviewed in [employer] record.  ROS  The following are not active complaints unless bolded Hoarseness, sore throat, dysphagia= globus, dental problems, itching, sneezing,  nasal congestion or discharge of excess mucus or purulent secretions, ear ache,   fever, chills, sweats, unintended wt loss or wt gain, classically pleuritic or exertional cp,   orthopnea pnd or arm/hand swelling  or leg swelling, presyncope, palpitations, abdominal pain, anorexia, nausea, vomiting, diarrhea  or change in bowel habits or change in bladder habits, change in stools or change in urine, dysuria, hematuria,  rash, arthralgias, visual complaints, headache, numbness, weakness or ataxia or problems with walking or coordination,  change in mood or  memory.           Past Medical History:  Diagnosis Date   Adrenal adenoma    Allergy    Anemia    Anxiety    CAD (coronary artery disease)    a. 50-60% mid LAD at cardiac catheterization 2014 [facility]) b. non-obstructive CAD by cath in 09/2018   Cataract    Depression    DJD (degenerative joint disease) of cervical spine 09/12/2016   H. pylori infection 07/19/2017   History of cardiomyopathy    History of COVID-19 12/10/2019   History of tobacco use    Hyperlipidemia    Mid back pain 02/08/2017   Neuromuscular disorder (HCC)    PAF (paroxysmal atrial fibrillation) ([place])    Diagnosis July 2017 - spontaneously resolved   Smell disturbance 06/13/2021   Statin intolerance     Outpatient Medications Prior to Visit - - NOTE:   Unable to verify as accurately reflecting what pt takes    Medication Sig Dispense Refill   Biotin 10000 MCG TABS Take 10,000 mcg by mouth daily at 12 noon.  buPROPion (WELLBUTRIN SR) 150 MG 12 hr tablet TAKE ONE TABLET BY MOUTH TWICE A DAY 180 tablet 1   Cholecalciferol (VITAMIN D3) 125 MCG (5000 UT) TABS Take 5,000 Units by mouth daily at 12 noon.     Coenzyme Q10 100 MG capsule Take 100 mg by mouth daily at 12 noon.     diazepam (VALIUM) 5 MG tablet Take 1 tablet (5 mg total) by mouth daily as needed for anxiety or muscle spasms. 30 tablet 0   docusate sodium (COLACE) 100 MG capsule Take 100 mg by mouth 2 (two) times daily.      esomeprazole (NEXIUM) 40 MG capsule Take 1 capsule (40 mg total) by mouth daily before breakfast. 30 capsule 2   estradiol (ESTRACE) 0.5 MG tablet Take 1  tablet (0.5 mg total) by mouth daily. 90 tablet 1   estradiol (ESTRACE) 2 MG tablet Take 1 tablet (2 mg total) by mouth daily. (Patient taking differently: Take 3 mg by mouth daily.) 90 tablet 1   nitrofurantoin, macrocrystal-monohydrate, (MACROBID) 100 MG capsule Take 1 capsule (100 mg total) by mouth 2 (two) times daily. (Patient taking differently: Take 100 mg by mouth daily as needed (cystitis flare).) 60 capsule 0   nitroGLYCERIN (NITROSTAT) 0.4 MG SL tablet Place 1 tablet under the tongue every 5 minutes as needed for chest pain. 25 tablet 1   oxyCODONE-acetaminophen (PERCOCET/ROXICET) 5-325 MG tablet TAKE 1 TABLET DAILY AS NEEDED FOR SEVERE PAIN. 30 tablet 0   progesterone (PROMETRIUM) 200 MG capsule Take 1 capsule (200 mg total) by mouth daily. 90 capsule 1   warfarin (COUMADIN) 5 MG tablet Take one to one and one-half tablets by mouth daily or as directed. Resume warfarin today. Following bridging instructions if any. (Patient taking differently: Take 5-7.5 mg by mouth See admin instructions. Take 7.5 mg on Mon, Tues, Wed, Fri, and Sat. Take 5 mg on Thurs and Sun) 135 tablet 1   No facility-administered medications prior to visit.     Objective:     BP 130/82 (BP Location: Left Arm, Patient Position: Sitting)   Pulse 68   Temp 98.4 F (36.9 C)   Ht 5\' 4"  (1.626 m)   Wt 150 lb (68 kg)   LMP 07/08/1979 (Approximate)   SpO2 98% Comment: ra  BMI 25.75 kg/m   SpO2: 98 % (ra)  Pleasant somewhat tense amb wf nad   HEENT : pt wearing mask not removed for exam due to covid -19 concerns.    NECK :  without JVD/Nodes/TM/ nl carotid upstrokes bilaterally   LUNGS: no acc muscle use,  Nl contour chest which is clear to A and P bilaterally without cough on insp or exp maneuvers   CV:  RRR  no s3 or murmur or increase in P2, and no edema   ABD:  soft and nontender with nl inspiratory excursion in the supine position. No bruits or organomegaly appreciated, bowel sounds nl  MS:  Nl  gait/ ext warm without deformities, calf tenderness, cyanosis or clubbing No obvious joint restrictions   SKIN: warm and dry without lesions    NEURO:  alert, approp, nl sensorium with  no motor or cerebellar deficits apparent.    Lab Results  Component Value Date   TSH <0.01 (L) 01/04/2021         Assessment   DOE (dyspnea on exertion) Onset spring 2022  - assoc with cp but neg cards w/u including  [place] 06/01/21   With LVEDP 15 an  no significant CAD - assoc with HH on EGD  07/18/21 and no better p dilation - assoc globus reported 10/09/2021 so rec max gerd rx and f/u Bettina Gavia at [facility] voice center   Symptoms of resting spells of sob are markedly disproportionate to objective findings and not clear to what extent this is actually a pulmonary  problem but pt does appear to have difficult to sort out respiratory symptoms of unknown origin for which  DDX  = almost all start with A and  include Adherence, Ace Inhibitors, Acid Reflux, Active Sinus Disease, Alpha 1 Antitripsin deficiency, Anxiety masquerading as Airways dz,  ABPA,  Allergy(esp in young), Aspiration (esp in elderly), Adverse effects of meds,  Active smoking or Vaping, A bunch of PE's/clot burden (a few small clots can't cause this syndrome unless there is already severe underlying pulm or vascular dz with poor reserve),  Anemia or thyroid disorder, plus two Bs  = Bronchiectasis and Beta blocker use..and one C= CHF     Adherence is always the initial "prime suspect" and is a multilayered concern that requires a "trust but verify" approach in every patient - starting with knowing how to use medications, especially inhalers, correctly, keeping up with refills and understanding the fundamental difference between maintenance and prns vs those medications only taken for a very short course and then stopped and not refilled.  - not sure at all I have full/accurate med list > if needs to return should do so with all meds in hand using a trust  but verify approach to confirm accurate Medication  Reconciliation The principal here is that until we are certain that the  patients are doing what we've asked, it makes no sense to ask them to do more.   ? Acid (or non-acid) GERD > always difficult to exclude as up to 75% of pts in some series report no assoc GI/ Heartburn symptoms and she has assoc atypical cp > rec max (24h)  acid suppression and diet restrictions/ reviewed and instructions given in writing.   ? Adverse drug effects:  Prometrium/ macrobid potential concerns see avs for instructions unique to this ov  ? Allergy/ asthma > does not fit the pattern, no assoc rhinitis   ? Anemia/ thyroid dz   Lab Results  Component Value Date   HGB 12.3 05/15/2021   HGB 13.0 06/05/2020   HGB 12.2 09/18/2018    Lab Results  Component Value Date   TSH <0.01 (L) 01/04/2021    >>> need to make the thryroid issue is addressed before additional pulmonary eval   ? Anxiety/ VCD  > usually at the bottom of this list of usual suspects but may interfere with adherence and also interpretation of response or lack thereof to symptom management which can be quite subjective.   ? Chf/ IHD > ruled out recently by cards    Will start with gerd rx and eval for VCD by [facility] at [facility] due to new chronic refractory globus sensation and encourage regular walking / 02 sat monitoring and f/u prn     Neck pain Globus x 5 year reported 10/09/2021 > rec gerd rx and f/u with Dr Joya Gaskins at [facility] for suspected VCD     Each maintenance medication was reviewed in detail including emphasizing most importantly the difference between maintenance and prns and under what circumstances the prns are to be triggered using an action plan format where appropriate.  Total time for H and P, extensive chart review,  counseling,  and generating customized AVS unique to this office visit / same day charting  > 60 min        Christinia Gully, MD 10/09/2021

## 2021-10-09 NOTE — Assessment & Plan Note (Signed)
Globus x 5 year reported 10/09/2021 > rec gerd rx and f/u with Dr Joya Gaskins at [facility] for suspected VCD

## 2021-10-09 NOTE — Assessment & Plan Note (Addendum)
Onset spring 2022  - assoc with cp but neg cards w/u including  [place] 06/01/21   With LVEDP 15 an no significant CAD - assoc with [place] on EGD  07/18/21 and no better p dilation - assoc globus reported 10/09/2021 so rec max gerd rx and f/u Bettina Gavia at [facility] voice center   Symptoms of resting spells of sob are markedly disproportionate to objective findings and not clear to what extent this is actually a pulmonary  problem but pt does appear to have difficult to sort out respiratory symptoms of unknown origin for which  DDX  = almost all start with A and  include Adherence, Ace Inhibitors, Acid Reflux, Active Sinus Disease, Alpha 1 Antitripsin deficiency, Anxiety masquerading as Airways dz,  ABPA,  Allergy(esp in young), Aspiration (esp in elderly), Adverse effects of meds,  Active smoking or Vaping, A bunch of PE's/clot burden (a few small clots can't cause this syndrome unless there is already severe underlying pulm or vascular dz with poor reserve),  Anemia or thyroid disorder, plus two Bs  = Bronchiectasis and Beta blocker use..and one C= CHF     Adherence is always the initial "prime suspect" and is a multilayered concern that requires a "trust but verify" approach in every patient - starting with knowing how to use medications, especially inhalers, correctly, keeping up with refills and understanding the fundamental difference between maintenance and prns vs those medications only taken for a very short course and then stopped and not refilled.  - not sure at all I have full/accurate med list > if needs to return should do so with all meds in hand using a trust but verify approach to confirm accurate Medication  Reconciliation The principal here is that until we are certain that the  patients are doing what we've asked, it makes no sense to ask them to do more.   ? Acid (or non-acid) GERD > always difficult to exclude as up to 75% of pts in some series report no assoc GI/ Heartburn symptoms and she has assoc  atypical cp > rec max (24h)  acid suppression and diet restrictions/ reviewed and instructions given in writing.   ? Allergy/ asthma > does not fit the pattern, no assoc rhinitis   ? Anemia/ thyroid dz   Lab Results  Component Value Date   HGB 12.3 05/15/2021   HGB 13.0 06/05/2020   HGB 12.2 09/18/2018    Lab Results  Component Value Date   TSH <0.01 (L) 01/04/2021    >>> need to make the thryroid issue is addressed before additional pulmonary eval   ? Adverse drug effects:  Prometrium/ macrobid potential concerns see avs for instructions unique to this ov    ? Anxiety/ VCD  > usually at the bottom of this list of usual suspects but may interfere with adherence and also interpretation of response or lack thereof to symptom management which can be quite subjective.   ? Chf/ IHD > ruled out recently by cards    Will start with gerd rx and eval for VCD by [facility] due to new chronic refractory globus sensation and encourage regular walking / 02 sat monitoring and f/u prn          Each maintenance medication was reviewed in detail including emphasizing most importantly the difference between maintenance and prns and under what circumstances the prns are to be triggered using an action plan format where appropriate.  Total time for H and P,  extensive chart review, counseling,  and generating customized AVS unique to this office visit / same day charting  > 60 min

## 2021-10-10 ENCOUNTER — Telehealth: Payer: Self-pay

## 2021-10-10 NOTE — Telephone Encounter (Signed)
LVM for patient to call the [facility] office in regards to bringing lab work req. By Dr. Melvyn Novas.

## 2021-10-11 ENCOUNTER — Encounter: Payer: Self-pay | Admitting: Internal Medicine

## 2021-10-11 NOTE — Telephone Encounter (Signed)
MW please advise. thanks 

## 2021-10-17 ENCOUNTER — Ambulatory Visit (INDEPENDENT_AMBULATORY_CARE_PROVIDER_SITE_OTHER): Payer: Medicare Other | Admitting: *Deleted

## 2021-10-17 DIAGNOSIS — Z5181 Encounter for therapeutic drug level monitoring: Secondary | ICD-10-CM | POA: Diagnosis not present

## 2021-10-17 DIAGNOSIS — I480 Paroxysmal atrial fibrillation: Secondary | ICD-10-CM | POA: Diagnosis not present

## 2021-10-17 LAB — POCT INR: INR: 2 (ref 2.0–3.0)

## 2021-10-17 NOTE — Patient Instructions (Signed)
Continue warfarin 1 tablet daily except 1 1/2 tablets on Tuesdays, Thursdays and Saturdays Recheck INR in 4 weeks.

## 2021-11-14 ENCOUNTER — Ambulatory Visit (INDEPENDENT_AMBULATORY_CARE_PROVIDER_SITE_OTHER): Payer: Medicare Other | Admitting: *Deleted

## 2021-11-14 DIAGNOSIS — E039 Hypothyroidism, unspecified: Secondary | ICD-10-CM | POA: Diagnosis not present

## 2021-11-14 DIAGNOSIS — Z5181 Encounter for therapeutic drug level monitoring: Secondary | ICD-10-CM

## 2021-11-14 DIAGNOSIS — I480 Paroxysmal atrial fibrillation: Secondary | ICD-10-CM

## 2021-11-14 DIAGNOSIS — N951 Menopausal and female climacteric states: Secondary | ICD-10-CM | POA: Diagnosis not present

## 2021-11-14 LAB — POCT INR: INR: 2.3 (ref 2.0–3.0)

## 2021-11-14 NOTE — Patient Instructions (Signed)
Continue warfarin 1 tablet daily except 1 1/2 tablets on Tuesdays, Thursdays and Saturdays Recheck INR in 6 weeks.

## 2021-11-17 ENCOUNTER — Other Ambulatory Visit (INDEPENDENT_AMBULATORY_CARE_PROVIDER_SITE_OTHER): Payer: Self-pay | Admitting: Internal Medicine

## 2021-11-20 DIAGNOSIS — Z6824 Body mass index (BMI) 24.0-24.9, adult: Secondary | ICD-10-CM | POA: Diagnosis not present

## 2021-11-20 DIAGNOSIS — N898 Other specified noninflammatory disorders of vagina: Secondary | ICD-10-CM | POA: Diagnosis not present

## 2021-11-20 DIAGNOSIS — R232 Flushing: Secondary | ICD-10-CM | POA: Diagnosis not present

## 2021-11-20 DIAGNOSIS — F3341 Major depressive disorder, recurrent, in partial remission: Secondary | ICD-10-CM | POA: Diagnosis not present

## 2021-11-20 DIAGNOSIS — E7800 Pure hypercholesterolemia, unspecified: Secondary | ICD-10-CM | POA: Diagnosis not present

## 2021-11-20 DIAGNOSIS — E039 Hypothyroidism, unspecified: Secondary | ICD-10-CM | POA: Diagnosis not present

## 2021-11-30 ENCOUNTER — Ambulatory Visit: Payer: Medicare Other | Admitting: Internal Medicine

## 2021-12-11 ENCOUNTER — Other Ambulatory Visit: Payer: Self-pay | Admitting: Neurology

## 2021-12-11 ENCOUNTER — Telehealth: Payer: Self-pay | Admitting: Neurology

## 2021-12-11 MED ORDER — OXYCODONE-ACETAMINOPHEN 5-325 MG PO TABS: ORAL_TABLET | ORAL | 0 refills | Status: DC

## 2021-12-11 NOTE — Telephone Encounter (Signed)
Still pending approval by MD. I changed pharmacy to [facility] so rx will go there once MD approves.

## 2021-12-11 NOTE — Telephone Encounter (Signed)
Pt called in stating she needed a refill on her oxyCODONE-acetaminophen (PERCOCET/ROXICET) 5-325 MG tablet. Would like this sent to the [facility] in [place].

## 2021-12-11 NOTE — Addendum Note (Signed)
Addended by: Wyvonnia Lora on: 12/11/2021 10:26 AM   Modules accepted: Orders

## 2021-12-11 NOTE — Telephone Encounter (Signed)
Pt called back stating that she needs her oxyCODONE-acetaminophen (PERCOCET/ROXICET) 5-325 MG tablet refill sent to [facility] in [place] instead of [place].

## 2021-12-11 NOTE — Addendum Note (Signed)
Addended by: Wyvonnia Lora on: 12/11/2021 10:47 AM   Modules accepted: Orders

## 2021-12-12 DIAGNOSIS — E039 Hypothyroidism, unspecified: Secondary | ICD-10-CM | POA: Diagnosis not present

## 2021-12-12 DIAGNOSIS — N3281 Overactive bladder: Secondary | ICD-10-CM | POA: Diagnosis not present

## 2021-12-12 DIAGNOSIS — U099 Post covid-19 condition, unspecified: Secondary | ICD-10-CM | POA: Diagnosis not present

## 2021-12-13 ENCOUNTER — Ambulatory Visit: Payer: Medicare Other | Admitting: Internal Medicine

## 2021-12-26 ENCOUNTER — Ambulatory Visit (INDEPENDENT_AMBULATORY_CARE_PROVIDER_SITE_OTHER): Payer: Medicare Other | Admitting: *Deleted

## 2021-12-26 DIAGNOSIS — Z5181 Encounter for therapeutic drug level monitoring: Secondary | ICD-10-CM | POA: Diagnosis not present

## 2021-12-26 DIAGNOSIS — I480 Paroxysmal atrial fibrillation: Secondary | ICD-10-CM

## 2021-12-26 LAB — POCT INR: INR: 1.7 — AB (ref 2.0–3.0)

## 2021-12-26 NOTE — Patient Instructions (Signed)
Take warfarin 2 tablets tonight then increase dose to 1 1/2 tablets daily except 1 tablet on Wednesdays and Saturdays Recheck INR in 3 weeks.

## 2022-01-02 ENCOUNTER — Encounter: Payer: Self-pay | Admitting: Neurology

## 2022-01-02 ENCOUNTER — Ambulatory Visit (INDEPENDENT_AMBULATORY_CARE_PROVIDER_SITE_OTHER): Payer: Medicare Other | Admitting: Neurology

## 2022-01-02 DIAGNOSIS — R439 Unspecified disturbances of smell and taste: Secondary | ICD-10-CM | POA: Diagnosis not present

## 2022-01-02 DIAGNOSIS — U099 Post covid-19 condition, unspecified: Secondary | ICD-10-CM

## 2022-01-02 DIAGNOSIS — M7918 Myalgia, other site: Secondary | ICD-10-CM

## 2022-01-02 DIAGNOSIS — R419 Unspecified symptoms and signs involving cognitive functions and awareness: Secondary | ICD-10-CM

## 2022-01-02 DIAGNOSIS — M62838 Other muscle spasm: Secondary | ICD-10-CM

## 2022-01-02 NOTE — Progress Notes (Signed)
[facility]  PATIENT: Heather [name] DOB: 06/06/1946  REFERRING DOCTOR OR PCP:  Blanchie Serve SOURCE: patient  _________________________________   HISTORICAL  CHIEF COMPLAINT:  Chief Complaint  Patient presents with   Procedure    Pt with husband, rm 1. Presents today for her botox. The last time she had botox that night she developed pain in mid section that she couldn't talk. She would curl up and she was given a valium and she straightened and EMS checked VS were ok. Dr Felecia Shelling was aware and there was concern possibly issue related to botox. She did indicate there was another episode that occurred briefly prior to the botox. 2 weeks ago things started to flare up.     HISTORY OF PRESENT ILLNESS:  Heather [name] is a 76 y.o. woman who has had chronic right-sided mid back pain since 2013.      Update 01/02/2022 She gets a benefit with Botox injections for the myofascial pain in the right trunk/back.  Pain has started to come back 2-3 weeks ago.  After the injection, in November, that night she ha intense pain in her chest x 1 hour.   She could not speak due to the pain and could not straighten up.  She had had a similar episode that was only a couple minutes a few weeks earlier.     Valium hleped her sleep but did not help the spasm.     She also has taken rare 1/2 percocet if pain is severe (just a fe a month)  Most nights she sleeps well but she has rare insomnia when pain is worse and will rarely take diazepam (usually just one or two a month).  She never combines with a percocet.   She also reports more fatigue.      \ She has hd sone neck pain bt not much currently.         No new weakness.  She does have some back and leg pain more on the right.    Lumbar MRI in 2014 showed DJD at L3-L4 with spondylolisthesis and possible effects on L3 nerve roots.    She reports occasional issues with short-term memory but feels she is better than she was in 2021.  She scored  28/30 on the [facility] cognitive assessment last year .  She has recently written a book.    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    She is on coumadin for atrial fibrillation.    She had Covid-19.   A few months later she started to have altered smell --- like Bounce sheets or cigars.        IMAGING:  MRI results from 10/14/2013: The MRI of the brain showed age related atrophy and minimal chronic microvascular ischemic change. MRI of the cervical spine showed multilevel mild degenerative changes with left paramedian disc herniation at C3-C4 and left disc protrusion at C4-C5 and C5-C6 and midline disc herniation at C6-C7. There was no report of nerve root compression. MRI of the thoracic spine showed disc desiccation but no herniation or protrusions. MRI of the lumbar spine showed disc bulges at T12-L1 and L2-L3 and disc bulge with facet hypertrophy at L3-L4 and disc bulge with right foraminal annular tear at L4-L5.  MRI brain 7/4/2-22 was personally reviewed.  It shows mild generalized atrophy.  No acute findings.     REVIEW OF SYSTEMS: Constitutional: No fevers, chills, sweats, or change in appetite Eyes: No visual changes,  double vision, eye pain Ear, nose and throat: No hearing loss, ear pain, nasal congestion, sore throat Cardiovascular: No chest pain, palpitations.   She has atrial fibrillation and is on warfarin Respiratory:  No shortness of breath at rest or with exertion.   No wheezes GastrointestinaI: No nausea, vomiting, diarrhea, abdominal pain, fecal incontinence Genitourinary:  No dysuria, urinary retention or frequency.  No nocturia. Musculoskeletal:  No neck pain.  She has back pain/thoracic pain as above Integumentary: No rash, pruritus, skin lesions Neurological: as above Psychiatric: No depression at this time.  No anxiety Endocrine: No palpitations, diaphoresis, change in appetite, change in weigh or increased thirst Hematologic/Lymphatic:  No anemia,  purpura, petechiae. Allergic/Immunologic: No itchy/runny eyes, nasal congestion, recent allergic reactions, rashes  ALLERGIES: Allergies  Allergen Reactions   Statins Other (See Comments)    Muscle weakness   Other     EKG pads causes blisters    Sulfa Antibiotics Rash    HOME MEDICATIONS:  Current Outpatient Medications:    Biotin 10000 MCG TABS, Take 10,000 mcg by mouth daily at 12 noon., Disp: , Rfl:    buPROPion (WELLBUTRIN SR) 150 MG 12 hr tablet, TAKE ONE TABLET BY MOUTH TWICE A DAY, Disp: 180 tablet, Rfl: 1   Cholecalciferol (VITAMIN D3) 125 MCG (5000 UT) TABS, Take 5,000 Units by mouth daily at 12 noon., Disp: , Rfl:    Coenzyme Q10 100 MG capsule, Take 100 mg by mouth daily at 12 noon., Disp: , Rfl:    diazepam (VALIUM) 5 MG tablet, Take 1 tablet (5 mg total) by mouth daily as needed for anxiety or muscle spasms., Disp: 30 tablet, Rfl: 0   docusate sodium (COLACE) 100 MG capsule, Take 100 mg by mouth 2 (two) times daily. , Disp: , Rfl:    esomeprazole (NEXIUM) 40 MG capsule, TAKE 1 CAPSULE(40 MG) BY MOUTH DAILY BEFORE AND BREAKFAST, Disp: 30 capsule, Rfl: 2   estradiol (ESTRACE) 2 MG tablet, Take 1 tablet (2 mg total) by mouth daily. (Patient taking differently: Take 3 mg by mouth daily.), Disp: 90 tablet, Rfl: 1   famotidine (PEPCID) 20 MG tablet, One after supper, Disp: 30 tablet, Rfl: 11   nitrofurantoin, macrocrystal-monohydrate, (MACROBID) 100 MG capsule, Take 1 capsule (100 mg total) by mouth 2 (two) times daily. (Patient taking differently: Take 100 mg by mouth daily as needed (cystitis flare).), Disp: 60 capsule, Rfl: 0   nitroGLYCERIN (NITROSTAT) 0.4 MG SL tablet, Place 1 tablet under the tongue every 5 minutes as needed for chest pain., Disp: 25 tablet, Rfl: 1   oxyCODONE-acetaminophen (PERCOCET/ROXICET) 5-325 MG tablet, TAKE 1 TABLET DAILY AS NEEDED FOR SEVERE PAIN., Disp: 30 tablet, Rfl: 0   progesterone (PROMETRIUM) 200 MG capsule, Take 1 capsule (200 mg total) by  mouth daily., Disp: 90 capsule, Rfl: 1   warfarin (COUMADIN) 5 MG tablet, Take one to one and one-half tablets by mouth daily or as directed. Resume warfarin today. Following bridging instructions if any. (Patient taking differently: Take 5-7.5 mg by mouth See admin instructions. Take 7.5 mg on Mon, Tues, Wed, Fri, and Sat. Take 5 mg on Thurs and Sun), Disp: 135 tablet, Rfl: 1  PAST MEDICAL HISTORY: Past Medical History:  Diagnosis Date   Adrenal adenoma    Allergy    Anemia    Anxiety    CAD (coronary artery disease)    a. 50-60% mid LAD at cardiac catheterization 2014 [facility]) b. non-obstructive CAD by cath in 09/2018   Cataract  Depression    DJD (degenerative joint disease) of cervical spine 09/12/2016   H. pylori infection 07/19/2017   History of cardiomyopathy    History of COVID-19 12/10/2019   History of tobacco use    Hyperlipidemia    Mid back pain 02/08/2017   Neuromuscular disorder (HCC)    PAF (paroxysmal atrial fibrillation) ([place])    Diagnosis July 2017 - spontaneously resolved   Smell disturbance 06/13/2021   Statin intolerance     PAST SURGICAL HISTORY: Past Surgical History:  Procedure Laterality Date   ABDOMINAL HYSTERECTOMY  1980   endometriosis   BIOPSY  07/18/2021   Procedure: BIOPSY;  Surgeon: Harvel Quale, MD;  [facility];  Service: Gastroenterology;;   CATARACT EXTRACTION Left 11/2018   ESOPHAGOGASTRODUODENOSCOPY (EGD) WITH PROPOFOL N/A 07/18/2021   Procedure: ESOPHAGOGASTRODUODENOSCOPY (EGD) WITH PROPOFOL;  Surgeon: Harvel Quale, MD;  [facility];  Service: Gastroenterology;  Laterality: N/A;  12:00   INTRAVASCULAR PRESSURE WIRE/FFR STUDY N/A 06/01/2021   Procedure: INTRAVASCULAR PRESSURE WIRE/FFR STUDY;  Surgeon: Belva Crome, MD;  Location: [place] [facility];  Service: Cardiovascular;  Laterality: N/A;   LEFT HEART CATH AND CORONARY ANGIOGRAPHY N/A 09/25/2018   Procedure: LEFT HEART CATH AND  CORONARY ANGIOGRAPHY;  Surgeon: Belva Crome, MD;  Location: [place] [facility];  Service: Cardiovascular;  Laterality: N/A;   LEFT HEART CATH AND CORONARY ANGIOGRAPHY N/A 06/01/2021   Procedure: LEFT HEART CATH AND CORONARY ANGIOGRAPHY;  Surgeon: Belva Crome, MD;  Location: [place] [facility];  Service: Cardiovascular;  Laterality: N/A;   SAVORY DILATION  07/18/2021   Procedure: SAVORY DILATION;  Surgeon: Montez Morita, Quillian Quince, MD;  [facility];  Service: Gastroenterology;;   TONSILLECTOMY  1954    FAMILY HISTORY: Family History  Problem Relation Age of Onset   COPD Mother    Hearing loss Mother    Stroke Mother    Heart disease Mother        chf, died at 22   Arthritis Father    Hypertension Father    Heart disease Father        cad, pvd, died at 10   Heart disease Sister        heart valve   Arthritis Sister    Hyperlipidemia Brother    Hypertension Brother    Cancer - Prostate Brother     SOCIAL HISTORY:  Social History   Socioeconomic History   Marital status: Married    Spouse name: Al   Number of children: 0   Years of education: 18   Highest education level: Not on file  Occupational History   Occupation: Probation officer    Comment: from home  Tobacco Use   Smoking status: Former    Packs/day: 0.00    Types: Cigarettes    Start date: 11/12/1965    Quit date: 09/06/2008    Years since quitting: 13.3   Smokeless tobacco: Never  Vaping Use   Vaping Use: Never used  Substance and Sexual Activity   Alcohol use: Yes    Alcohol/week: 1.0 standard drink    Types: 1 Glasses of wine per week    Comment: occ   Drug use: Yes    Types: Marijuana    Comment: rarely   Sexual activity: Yes    Birth control/protection: Post-menopausal  Other Topics Concern   Not on file  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   Lives with husband AL  No children   Healthy diet and lifestyle   Social Determinants of Health   Financial Resource Strain: Low Risk     Difficulty of Paying Living Expenses: Not hard at all  Food Insecurity: No Food Insecurity   Worried About Charity fundraiser in the Last Year: Never true   Ran Out of Food in the Last Year: Never true  Transportation Needs: No Transportation Needs   Lack of Transportation (Medical): No   Lack of Transportation (Non-Medical): No  Physical Activity: Insufficiently Active   Days of Exercise per Week: 3 days   Minutes of Exercise per Session: 30 min  Stress: No Stress Concern Present   Feeling of Stress : Only a little  Social Connections: Moderately Isolated   Frequency of Communication with Friends and Family: More than three times a week   Frequency of Social Gatherings with Friends and Family: Twice a week   Attends Religious Services: Never   Marine scientist or Organizations: No   Attends Music therapist: Never   Marital Status: Married  Human resources officer Violence: Not At Risk   Fear of Current or Ex-Partner: No   Emotionally Abused: No   Physically Abused: No   Sexually Abused: No     PHYSICAL EXAM  There were no vitals filed for this visit.   There is no height or weight on file to calculate BMI.   General: The patient is well-developed and well-nourished and in no acute distress.  Head is normocephalic and atraumatic.   Musculoskeletal : There is tenderness in the mid to lower thoracic intercoastal muscles from T7-T9 on the right .  The neck was nontender today.  Neurologic Exam  Mental status: The patient is alert and oriented x 3 at the time of the examination.  She scored 28/30 on the [facility] (details above).  Focus and attention seem normal.   Speech is normal.  Cranial nerves: Extraocular muscles are intact.  Facial strength is normal.   . No obvious hearing deficits are noted.  Motor: She has normal muscle tone, muscle bulk and muscle strength in the arms or legs.  gait and station: Gait and station are normal.  Romberg  negative.     ASSESSMENT AND PLAN  1. Myofascial pain syndrome   2. Muscle spasm   3. Post-COVID syndrome   4. Smell disturbance   5. Cognitive complaints        1.  Inject 100 units Botox split into tender points below the T6-T9 ribs into the intercostal muscles near the midclavicular line.  There were no complications and she tolerated the injections well.    2.   Stay active and exercise as tolerated.  Can take rare Percocet as needed for days when there is more intense pain.  If severe spasms take Valium 3.   She will return in 3-4 months for next Botox or sooner if  new or worsening neurologic symptoms.   Inioluwa Baris A. Felecia Shelling, MD, PhD 02/28/6221, 9:79 PM Certified in Neurology, Clinical Neurophysiology, Sleep Medicine, Pain Medicine and Neuroimaging  [facility] [address] [place], [place] [address] [phone]

## 2022-01-08 ENCOUNTER — Ambulatory Visit
Admission: EM | Admit: 2022-01-08 | Discharge: 2022-01-08 | Disposition: A | Discharge status: Home / Self Care | Payer: Medicare Other | Attending: Urgent Care | Admitting: Urgent Care

## 2022-01-08 ENCOUNTER — Other Ambulatory Visit: Payer: Self-pay

## 2022-01-08 DIAGNOSIS — R21 Rash and other nonspecific skin eruption: Secondary | ICD-10-CM | POA: Diagnosis not present

## 2022-01-08 DIAGNOSIS — J383 Other diseases of vocal cords: Secondary | ICD-10-CM | POA: Diagnosis not present

## 2022-01-08 DIAGNOSIS — J387 Other diseases of larynx: Secondary | ICD-10-CM | POA: Diagnosis not present

## 2022-01-08 DIAGNOSIS — J384 Edema of larynx: Secondary | ICD-10-CM | POA: Diagnosis not present

## 2022-01-08 DIAGNOSIS — R0989 Other specified symptoms and signs involving the circulatory and respiratory systems: Secondary | ICD-10-CM | POA: Diagnosis not present

## 2022-01-08 DIAGNOSIS — L539 Erythematous condition, unspecified: Secondary | ICD-10-CM | POA: Diagnosis not present

## 2022-01-08 DIAGNOSIS — R490 Dysphonia: Secondary | ICD-10-CM | POA: Diagnosis not present

## 2022-01-08 DIAGNOSIS — F458 Other somatoform disorders: Secondary | ICD-10-CM | POA: Diagnosis not present

## 2022-01-08 MED ORDER — VALACYCLOVIR HCL 1 G PO TABS: 1000.0000 mg | ORAL_TABLET | Freq: Three times a day (TID) | ORAL | 0 refills | Status: DC

## 2022-01-08 NOTE — ED Provider Notes (Signed)
[place]   MRN: [id number] DOB: 10/18/1946  Subjective:   Heather [name] is a 76 y.o. female presenting for acute onset of a rash over the right forehead just a few hours before coming into clinic.  Patient has concerns about shingles.  States that she does not want to develop it at looked at pictures online, would like coverage for this.  No pain, tingling, burning sensations, bleeding, wounds, fever, trauma.  No current facility-administered medications for this encounter.  Current Outpatient Medications:    Biotin 10000 MCG TABS, Take 10,000 mcg by mouth daily at 12 noon., Disp: , Rfl:    buPROPion (WELLBUTRIN SR) 150 MG 12 hr tablet, TAKE ONE TABLET BY MOUTH TWICE A DAY, Disp: 180 tablet, Rfl: 1   Cholecalciferol (VITAMIN D3) 125 MCG (5000 UT) TABS, Take 5,000 Units by mouth daily at 12 noon., Disp: , Rfl:    Coenzyme Q10 100 MG capsule, Take 100 mg by mouth daily at 12 noon., Disp: , Rfl:    diazepam (VALIUM) 5 MG tablet, Take 1 tablet (5 mg total) by mouth daily as needed for anxiety or muscle spasms., Disp: 30 tablet, Rfl: 0   docusate sodium (COLACE) 100 MG capsule, Take 100 mg by mouth 2 (two) times daily. , Disp: , Rfl:    esomeprazole (NEXIUM) 40 MG capsule, TAKE 1 CAPSULE(40 MG) BY MOUTH DAILY BEFORE AND BREAKFAST, Disp: 30 capsule, Rfl: 2   estradiol (ESTRACE) 2 MG tablet, Take 1 tablet (2 mg total) by mouth daily. (Patient taking differently: Take 3 mg by mouth daily.), Disp: 90 tablet, Rfl: 1   famotidine (PEPCID) 20 MG tablet, One after supper, Disp: 30 tablet, Rfl: 11   nitrofurantoin, macrocrystal-monohydrate, (MACROBID) 100 MG capsule, Take 1 capsule (100 mg total) by mouth 2 (two) times daily. (Patient taking differently: Take 100 mg by mouth daily as needed (cystitis flare).), Disp: 60 capsule, Rfl: 0   nitroGLYCERIN (NITROSTAT) 0.4 MG SL tablet, Place 1 tablet under the tongue every 5 minutes as needed for chest pain., Disp: 25 tablet, Rfl: 1    oxyCODONE-acetaminophen (PERCOCET/ROXICET) 5-325 MG tablet, TAKE 1 TABLET DAILY AS NEEDED FOR SEVERE PAIN., Disp: 30 tablet, Rfl: 0   progesterone (PROMETRIUM) 200 MG capsule, Take 1 capsule (200 mg total) by mouth daily., Disp: 90 capsule, Rfl: 1   warfarin (COUMADIN) 5 MG tablet, Take one to one and one-half tablets by mouth daily or as directed. Resume warfarin today. Following bridging instructions if any. (Patient taking differently: Take 5-7.5 mg by mouth See admin instructions. Take 7.5 mg on Mon, Tues, Wed, Fri, and Sat. Take 5 mg on Thurs and Sun), Disp: 135 tablet, Rfl: 1   Allergies  Allergen Reactions   Statins Other (See Comments)    Muscle weakness   Other     EKG pads causes blisters    Sulfa Antibiotics Rash    Past Medical History:  Diagnosis Date   Adrenal adenoma    Allergy    Anemia    Anxiety    CAD (coronary artery disease)    a. 50-60% mid LAD at cardiac catheterization 2014 [facility]) b. non-obstructive CAD by cath in 09/2018   Cataract    Depression    DJD (degenerative joint disease) of cervical spine 09/12/2016   H. pylori infection 07/19/2017   History of cardiomyopathy    History of COVID-19 12/10/2019   History of tobacco use    Hyperlipidemia    Mid back pain 02/08/2017   Neuromuscular  disorder ([place])    PAF (paroxysmal atrial fibrillation) [facility])    Diagnosis July 2017 - spontaneously resolved   Smell disturbance 06/13/2021   Statin intolerance      Past Surgical History:  Procedure Laterality Date   ABDOMINAL HYSTERECTOMY  1980   endometriosis   BIOPSY  07/18/2021   Procedure: BIOPSY;  Surgeon: Harvel Quale, MD;  [facility];  Service: Gastroenterology;;   CATARACT EXTRACTION Left 11/2018   ESOPHAGOGASTRODUODENOSCOPY (EGD) WITH PROPOFOL N/A 07/18/2021   Procedure: ESOPHAGOGASTRODUODENOSCOPY (EGD) WITH PROPOFOL;  Surgeon: Harvel Quale, MD;  [facility];  Service: Gastroenterology;  Laterality: N/A;   12:00   INTRAVASCULAR PRESSURE WIRE/FFR STUDY N/A 06/01/2021   Procedure: INTRAVASCULAR PRESSURE WIRE/FFR STUDY;  Surgeon: Belva Crome, MD;  Location: [place] [facility];  Service: Cardiovascular;  Laterality: N/A;   LEFT HEART CATH AND CORONARY ANGIOGRAPHY N/A 09/25/2018   Procedure: LEFT HEART CATH AND CORONARY ANGIOGRAPHY;  Surgeon: Belva Crome, MD;  Location: [place] [facility];  Service: Cardiovascular;  Laterality: N/A;   LEFT HEART CATH AND CORONARY ANGIOGRAPHY N/A 06/01/2021   Procedure: LEFT HEART CATH AND CORONARY ANGIOGRAPHY;  Surgeon: Belva Crome, MD;  Location: [place] [facility];  Service: Cardiovascular;  Laterality: N/A;   SAVORY DILATION  07/18/2021   Procedure: SAVORY DILATION;  Surgeon: Montez Morita, Quillian Quince, MD;  [facility];  Service: Gastroenterology;;   TONSILLECTOMY  1954    Family History  Problem Relation Age of Onset   COPD Mother    Hearing loss Mother    Stroke Mother    Heart disease Mother        chf, died at 40   Arthritis Father    Hypertension Father    Heart disease Father        cad, pvd, died at 56   Heart disease Sister        heart valve   Arthritis Sister    Hyperlipidemia Brother    Hypertension Brother    Cancer - Prostate Brother     Social History   Tobacco Use   Smoking status: Former    Packs/day: 0.00    Types: Cigarettes    Start date: 11/12/1965    Quit date: 09/06/2008    Years since quitting: 13.3   Smokeless tobacco: Never  Vaping Use   Vaping Use: Never used  Substance Use Topics   Alcohol use: Yes    Alcohol/week: 1.0 standard drink    Types: 1 Glasses of wine per week    Comment: occ   Drug use: Yes    Types: Marijuana    Comment: rarely    ROS   Objective:   Vitals: BP (!) 147/81    Pulse 65    Temp 98 F (36.7 C)    Resp 20    LMP 07/08/1979 (Approximate)    SpO2 98%   Physical Exam Constitutional:      General: She is not in acute distress.    Appearance: Normal appearance.  She is well-developed. She is not ill-appearing, toxic-appearing or diaphoretic.  HENT:     Head: Normocephalic and atraumatic.      Nose: Nose normal.     Mouth/Throat:     Mouth: Mucous membranes are moist.  Eyes:     General: No scleral icterus.       Right eye: No discharge.        Left eye: No discharge.  Extraocular Movements: Extraocular movements intact.  Cardiovascular:     Rate and Rhythm: Normal rate.  Pulmonary:     Effort: Pulmonary effort is normal.  Skin:    General: Skin is warm and dry.  Neurological:     General: No focal deficit present.     Mental Status: She is alert and oriented to person, place, and time.  Psychiatric:        Mood and Affect: Mood normal.        Behavior: Behavior normal.       Assessment and Plan :   PDMP not reviewed this encounter.  1. Facial rash    Patient has significant concerns about shingles.  I provided her with a printed prescription of Valtrex in the event that she develops vesicular lesions that are painful.  Otherwise recommended monitoring.  I suspect that this may be ecchymosis, which is possible since she is on Coumadin.  Recommended recheck as the rash develops.  Counseled patient on potential for adverse effects with medications prescribed/recommended today, ER and return-to-clinic precautions discussed, patient verbalized understanding.    Jaynee Eagles, PA-C 01/08/22 1700

## 2022-01-08 NOTE — ED Triage Notes (Signed)
Pt presents with rash on forehead that developed today, does not itch or hurt

## 2022-01-22 ENCOUNTER — Other Ambulatory Visit: Payer: Self-pay | Admitting: Neurology

## 2022-01-22 MED ORDER — OXYCODONE-ACETAMINOPHEN 5-325 MG PO TABS: ORAL_TABLET | ORAL | 0 refills | Status: DC

## 2022-01-22 NOTE — Telephone Encounter (Signed)
Pt called needing a refill request for her oxyCODONE-acetaminophen (PERCOCET/ROXICET) 5-325 MG tablet sent in to the [facility] in [place] ?

## 2022-02-06 ENCOUNTER — Other Ambulatory Visit: Payer: Self-pay | Admitting: Cardiology

## 2022-02-07 ENCOUNTER — Telehealth: Payer: Self-pay | Admitting: *Deleted

## 2022-02-07 NOTE — Telephone Encounter (Signed)
Request for warfarin refill: ?Last INR on 12/26/21 was 1.7 ?Next INR 03/14/22 ?LOV 07/27/21  Zandra Abts MD ? ?Refill approved ?

## 2022-02-07 NOTE — Telephone Encounter (Signed)
Pt called .  She is out of town and forgot her warfarin.  She missed her dose yesterday and today but will be back home tomorrow.  Told pt to take extra 1/2 tablet x 2 days then resume current dose.  INR appt made for 02/15/22.  Pt verbalized understanding. ?

## 2022-02-15 ENCOUNTER — Ambulatory Visit (INDEPENDENT_AMBULATORY_CARE_PROVIDER_SITE_OTHER): Payer: Medicare Other | Admitting: *Deleted

## 2022-02-15 DIAGNOSIS — I480 Paroxysmal atrial fibrillation: Secondary | ICD-10-CM

## 2022-02-15 DIAGNOSIS — Z5181 Encounter for therapeutic drug level monitoring: Secondary | ICD-10-CM

## 2022-02-15 LAB — POCT INR: INR: 1.5 — AB (ref 2.0–3.0)

## 2022-02-15 NOTE — Patient Instructions (Signed)
Take warfarin 2 tablets tonight and tomorrow night resume 1 1/2 tablets daily except 1 tablet on Wednesdays and Saturdays ?Recheck INR in 3 weeks.  ?

## 2022-02-28 ENCOUNTER — Other Ambulatory Visit: Payer: Self-pay | Admitting: Neurology

## 2022-02-28 MED ORDER — OXYCODONE-ACETAMINOPHEN 5-325 MG PO TABS
ORAL_TABLET | ORAL | 0 refills | Status: DC
Start: 2022-02-28 — End: 2022-03-05

## 2022-02-28 NOTE — Telephone Encounter (Signed)
I have routed this request to Dr Sater for review. The pt is due for the medication and [place] registry was verified.  

## 2022-02-28 NOTE — Telephone Encounter (Signed)
Pt request refill foroxyCODONE-acetaminophen (PERCOCET/ROXICET) 5-325 MG tablet at [place] ?

## 2022-03-05 ENCOUNTER — Telehealth: Payer: Self-pay | Admitting: Neurology

## 2022-03-05 MED ORDER — OXYCODONE-ACETAMINOPHEN 5-325 MG PO TABS: ORAL_TABLET | ORAL | 0 refills | Status: DC

## 2022-03-05 NOTE — Telephone Encounter (Signed)
Pt requesting refill for oxyCODONE-acetaminophen (PERCOCET/ROXICET) 5-325 MG tablet be resent to [place] ?Pt want to cancel the prescription sent to [facility] on 02/28/22, because [place] is cheaper.  ?

## 2022-03-05 NOTE — Telephone Encounter (Signed)
Called [facility] at [phone]. Spoke w/ Benjamine Mola. Cx rx oxycodone.  ?

## 2022-03-08 ENCOUNTER — Ambulatory Visit (INDEPENDENT_AMBULATORY_CARE_PROVIDER_SITE_OTHER): Payer: Medicare Other | Admitting: *Deleted

## 2022-03-08 DIAGNOSIS — Z5181 Encounter for therapeutic drug level monitoring: Secondary | ICD-10-CM | POA: Diagnosis not present

## 2022-03-08 DIAGNOSIS — I480 Paroxysmal atrial fibrillation: Secondary | ICD-10-CM | POA: Diagnosis not present

## 2022-03-08 LAB — POCT INR: INR: 1.9 — AB (ref 2.0–3.0)

## 2022-03-08 NOTE — Patient Instructions (Signed)
Increase warfarin to 1 1/2 tablets daily ?Recheck INR in 4 weeks.  ?

## 2022-03-12 ENCOUNTER — Other Ambulatory Visit: Payer: Self-pay | Admitting: *Deleted

## 2022-03-12 ENCOUNTER — Telehealth: Payer: Self-pay | Admitting: *Deleted

## 2022-03-12 ENCOUNTER — Telehealth: Payer: Medicare Other | Admitting: Internal Medicine

## 2022-03-12 DIAGNOSIS — N898 Other specified noninflammatory disorders of vagina: Secondary | ICD-10-CM | POA: Diagnosis not present

## 2022-03-12 DIAGNOSIS — Z6824 Body mass index (BMI) 24.0-24.9, adult: Secondary | ICD-10-CM | POA: Diagnosis not present

## 2022-03-12 DIAGNOSIS — E039 Hypothyroidism, unspecified: Secondary | ICD-10-CM | POA: Diagnosis not present

## 2022-03-12 DIAGNOSIS — N3281 Overactive bladder: Secondary | ICD-10-CM | POA: Diagnosis not present

## 2022-03-12 DIAGNOSIS — R5383 Other fatigue: Secondary | ICD-10-CM | POA: Diagnosis not present

## 2022-03-12 DIAGNOSIS — R6882 Decreased libido: Secondary | ICD-10-CM | POA: Diagnosis not present

## 2022-03-12 DIAGNOSIS — F3341 Major depressive disorder, recurrent, in partial remission: Secondary | ICD-10-CM | POA: Diagnosis not present

## 2022-03-12 DIAGNOSIS — N951 Menopausal and female climacteric states: Secondary | ICD-10-CM | POA: Diagnosis not present

## 2022-03-12 DIAGNOSIS — E7800 Pure hypercholesterolemia, unspecified: Secondary | ICD-10-CM | POA: Diagnosis not present

## 2022-03-12 NOTE — Telephone Encounter (Signed)
Pt would like CT chest lung cancer screening and gastro referral for colonoscopy. ? ?Please advise  ?

## 2022-03-13 ENCOUNTER — Other Ambulatory Visit: Payer: Self-pay | Admitting: *Deleted

## 2022-03-13 ENCOUNTER — Encounter (INDEPENDENT_AMBULATORY_CARE_PROVIDER_SITE_OTHER): Payer: Self-pay | Admitting: *Deleted

## 2022-03-13 DIAGNOSIS — F17211 Nicotine dependence, cigarettes, in remission: Secondary | ICD-10-CM

## 2022-03-13 DIAGNOSIS — Z1211 Encounter for screening for malignant neoplasm of colon: Secondary | ICD-10-CM

## 2022-03-13 DIAGNOSIS — Z122 Encounter for screening for malignant neoplasm of respiratory organs: Secondary | ICD-10-CM

## 2022-03-13 NOTE — Telephone Encounter (Signed)
Referral placed.

## 2022-03-19 DIAGNOSIS — E039 Hypothyroidism, unspecified: Secondary | ICD-10-CM | POA: Diagnosis not present

## 2022-03-19 DIAGNOSIS — Z6826 Body mass index (BMI) 26.0-26.9, adult: Secondary | ICD-10-CM | POA: Diagnosis not present

## 2022-03-19 DIAGNOSIS — N951 Menopausal and female climacteric states: Secondary | ICD-10-CM | POA: Diagnosis not present

## 2022-03-20 DIAGNOSIS — D225 Melanocytic nevi of trunk: Secondary | ICD-10-CM | POA: Diagnosis not present

## 2022-03-20 DIAGNOSIS — Z1283 Encounter for screening for malignant neoplasm of skin: Secondary | ICD-10-CM | POA: Diagnosis not present

## 2022-03-20 DIAGNOSIS — L708 Other acne: Secondary | ICD-10-CM | POA: Diagnosis not present

## 2022-03-27 ENCOUNTER — Ambulatory Visit (INDEPENDENT_AMBULATORY_CARE_PROVIDER_SITE_OTHER): Payer: Medicare Other | Admitting: Neurology

## 2022-03-27 VITALS — BP 99/67 | HR 52 | Ht 64.0 in | Wt 155.0 lb

## 2022-03-27 DIAGNOSIS — R419 Unspecified symptoms and signs involving cognitive functions and awareness: Secondary | ICD-10-CM

## 2022-03-27 DIAGNOSIS — R4789 Other speech disturbances: Secondary | ICD-10-CM

## 2022-03-27 DIAGNOSIS — M7918 Myalgia, other site: Secondary | ICD-10-CM

## 2022-03-27 DIAGNOSIS — M62838 Other muscle spasm: Secondary | ICD-10-CM | POA: Diagnosis not present

## 2022-03-27 NOTE — Progress Notes (Signed)
? ?[facility] ? ?PATIENT: Heather [name] ?DOB: 1945-12-06 ? ?REFERRING DOCTOR OR PCP:  Blanchie Serve ?SOURCE: patient ? ?_________________________________ ? ? ?HISTORICAL ? ?CHIEF COMPLAINT:  ?Chief Complaint  ?Patient presents with  ? Botulinum Toxin Injection  ?  RM 16, alone.  ? ? ?HISTORY OF PRESENT ILLNESS:  ?Heather [name] is a 76 y.o. woman who has had chronic right-sided mid back pain since 2013.     ? ?Update 03/27/2022 ?She felt she got a good benefit from the Botox injections for the myofascial pain in the right trunk 3 months ago.  Pain is you started to come back more.  She did not have severe pain at any time during the last 3 months.   When she does have more pain, she will take half of a Percocet. ? ?She notes being fairly active and does more gardening. ? ?She has had some periods where she has trouble coming up with the right words or states the wrong word.  This seems to be intermittent and not associated with any other symptoms.   She also has had some occasional issues with short-term memory but feels she is better than she was in 2021.  She scored 28/30 on the [facility] cognitive assessment in 2021 .  She has recently written a book.    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 ? ?Most nights she sleeps well but she has rare insomnia when pain is worse and will rarely take diazepam (usually just one or two a month).  She never combines with a percocet.   She also reports more fatigue.      ? ?She has no  new weakness.  She does have some back and leg pain more on the right.    Lumbar MRI in 2014 showed DJD at L3-L4 with spondylolisthesis and possible effects on L3 nerve roots.   ? ?She is on coumadin for atrial fibrillation.   ?    ? ? ?IMAGING: ? ?MRI results from 10/14/2013: The MRI of the brain showed age related atrophy and minimal chronic microvascular ischemic change. MRI of the cervical spine showed multilevel mild degenerative changes with left paramedian  disc herniation at C3-C4 and left disc protrusion at C4-C5 and C5-C6 and midline disc herniation at C6-C7. There was no report of nerve root compression. MRI of the thoracic spine showed disc desiccation but no herniation or protrusions. MRI of the lumbar spine showed disc bulges at T12-L1 and L2-L3 and disc bulge with facet hypertrophy at L3-L4 and disc bulge with right foraminal annular tear at L4-L5. ? ?MRI brain 7/4/2-22 was personally reviewed.  It shows mild generalized atrophy.  No acute findings.    ? ?REVIEW OF SYSTEMS: ?Constitutional: No fevers, chills, sweats, or change in appetite ?Eyes: No visual changes, double vision, eye pain ?Ear, nose and throat: No hearing loss, ear pain, nasal congestion, sore throat ?Cardiovascular: No chest pain, palpitations.   She has atrial fibrillation and is on warfarin ?Respiratory:  No shortness of breath at rest or with exertion.   No wheezes ?GastrointestinaI: No nausea, vomiting, diarrhea, abdominal pain, fecal incontinence ?Genitourinary:  No dysuria, urinary retention or frequency.  No nocturia. ?Musculoskeletal:  No neck pain.  She has back pain/thoracic pain as above ?Integumentary: No rash, pruritus, skin lesions ?Neurological: as above ?Psychiatric: No depression at this time.  No anxiety ?Endocrine: No palpitations, diaphoresis, change in appetite, change in weigh or increased thirst ?Hematologic/Lymphatic:  No anemia, purpura, petechiae. ?Allergic/Immunologic:  No itchy/runny eyes, nasal congestion, recent allergic reactions, rashes ? ?ALLERGIES: ?Allergies  ?Allergen Reactions  ? Statins Other (See Comments)  ?  Muscle weakness  ? Other   ?  EKG pads causes blisters   ? Sulfa Antibiotics Rash  ? ? ?HOME MEDICATIONS: ? ?Current Outpatient Medications:  ?  Biotin 10000 MCG TABS, Take 10,000 mcg by mouth daily at 12 noon., Disp: , Rfl:  ?  buPROPion (WELLBUTRIN SR) 150 MG 12 hr tablet, TAKE ONE TABLET BY MOUTH TWICE A DAY, Disp: 180 tablet, Rfl: 1 ?   Cholecalciferol (VITAMIN D3) 125 MCG (5000 UT) TABS, Take 5,000 Units by mouth daily at 12 noon., Disp: , Rfl:  ?  Coenzyme Q10 100 MG capsule, Take 100 mg by mouth daily at 12 noon., Disp: , Rfl:  ?  diazepam (VALIUM) 5 MG tablet, Take 1 tablet (5 mg total) by mouth daily as needed for anxiety or muscle spasms., Disp: 30 tablet, Rfl: 0 ?  docusate sodium (COLACE) 100 MG capsule, Take 100 mg by mouth 2 (two) times daily. , Disp: , Rfl:  ?  esomeprazole (NEXIUM) 40 MG capsule, TAKE 1 CAPSULE(40 MG) BY MOUTH DAILY BEFORE AND BREAKFAST, Disp: 30 capsule, Rfl: 2 ?  estradiol (ESTRACE) 2 MG tablet, Take 1 tablet (2 mg total) by mouth daily. (Patient taking differently: Take 3 mg by mouth daily.), Disp: 90 tablet, Rfl: 1 ?  famotidine (PEPCID) 20 MG tablet, One after supper, Disp: 30 tablet, Rfl: 11 ?  nitrofurantoin, macrocrystal-monohydrate, (MACROBID) 100 MG capsule, Take 1 capsule (100 mg total) by mouth 2 (two) times daily. (Patient taking differently: Take 100 mg by mouth daily as needed (cystitis flare).), Disp: 60 capsule, Rfl: 0 ?  nitroGLYCERIN (NITROSTAT) 0.4 MG SL tablet, Place 1 tablet under the tongue every 5 minutes as needed for chest pain., Disp: 25 tablet, Rfl: 1 ?  oxyCODONE-acetaminophen (PERCOCET/ROXICET) 5-325 MG tablet, TAKE 1 TABLET DAILY AS NEEDED FOR SEVERE PAIN., Disp: 30 tablet, Rfl: 0 ?  progesterone (PROMETRIUM) 200 MG capsule, Take 1 capsule (200 mg total) by mouth daily., Disp: 90 capsule, Rfl: 1 ?  valACYclovir (VALTREX) 1000 MG tablet, Take 1 tablet (1,000 mg total) by mouth 3 (three) times daily., Disp: 30 tablet, Rfl: 0 ?  warfarin (COUMADIN) 5 MG tablet, Take one to one and one-half tablets by mouth daily or as directed., Disp: 135 tablet, Rfl: 1 ? ?PAST MEDICAL HISTORY: ?Past Medical History:  ?Diagnosis Date  ? Adrenal adenoma   ? Allergy   ? Anemia   ? Anxiety   ? CAD (coronary artery disease)   ? a. 50-60% mid LAD at cardiac catheterization 2014 [facility]) b. non-obstructive  CAD by cath in 09/2018  ? Cataract   ? Depression   ? DJD (degenerative joint disease) of cervical spine 09/12/2016  ? H. pylori infection 07/19/2017  ? History of cardiomyopathy   ? History of COVID-19 12/10/2019  ? History of tobacco use   ? Hyperlipidemia   ? Mid back pain 02/08/2017  ? Neuromuscular disorder ([place])   ? PAF (paroxysmal atrial fibrillation) ([place])   ? Diagnosis July 2017 - spontaneously resolved  ? Smell disturbance 06/13/2021  ? Statin intolerance   ? ? ?PAST SURGICAL HISTORY: ?Past Surgical History:  ?Procedure Laterality Date  ? ABDOMINAL HYSTERECTOMY  1980  ? endometriosis  ? BIOPSY  07/18/2021  ? Procedure: BIOPSY;  Surgeon: Montez Morita, Quillian Quince, MD;  [facility];  Service: Gastroenterology;;  ? CATARACT EXTRACTION Left  11/2018  ? ESOPHAGOGASTRODUODENOSCOPY (EGD) WITH PROPOFOL N/A 07/18/2021  ? Procedure: ESOPHAGOGASTRODUODENOSCOPY (EGD) WITH PROPOFOL;  Surgeon: Harvel Quale, MD;  [facility];  Service: Gastroenterology;  Laterality: N/A;  12:00  ? INTRAVASCULAR PRESSURE WIRE/FFR STUDY N/A 06/01/2021  ? Procedure: INTRAVASCULAR PRESSURE WIRE/FFR STUDY;  Surgeon: Belva Crome, MD;  Location: [place] [facility];  Service: Cardiovascular;  Laterality: N/A;  ? LEFT HEART CATH AND CORONARY ANGIOGRAPHY N/A 09/25/2018  ? Procedure: LEFT HEART CATH AND CORONARY ANGIOGRAPHY;  Surgeon: Belva Crome, MD;  Location: [place] [facility];  Service: Cardiovascular;  Laterality: N/A;  ? LEFT HEART CATH AND CORONARY ANGIOGRAPHY N/A 06/01/2021  ? Procedure: LEFT HEART CATH AND CORONARY ANGIOGRAPHY;  Surgeon: Belva Crome, MD;  Location: [place] [facility];  Service: Cardiovascular;  Laterality: N/A;  ? SAVORY DILATION  07/18/2021  ? Procedure: SAVORY DILATION;  Surgeon: Montez Morita, Quillian Quince, MD;  [facility];  Service: Gastroenterology;;  ? [place]  ? ? ?FAMILY HISTORY: ?Family History  ?Problem Relation Age of Onset  ? COPD Mother   ? Hearing loss  Mother   ? Stroke Mother   ? Heart disease Mother   ?     chf, died at 61  ? Arthritis Father   ? Hypertension Father   ? Heart disease Father   ?     cad, pvd, died at 45  ? Heart disease Sister   ?     heart valve  ?

## 2022-04-04 ENCOUNTER — Encounter: Payer: Medicare Other | Admitting: Internal Medicine

## 2022-04-05 ENCOUNTER — Telehealth: Payer: Medicare Other | Admitting: Internal Medicine

## 2022-04-05 ENCOUNTER — Ambulatory Visit (INDEPENDENT_AMBULATORY_CARE_PROVIDER_SITE_OTHER): Payer: Medicare Other | Admitting: *Deleted

## 2022-04-05 ENCOUNTER — Encounter: Payer: Self-pay | Admitting: Internal Medicine

## 2022-04-05 ENCOUNTER — Other Ambulatory Visit (INDEPENDENT_AMBULATORY_CARE_PROVIDER_SITE_OTHER): Payer: Self-pay

## 2022-04-05 ENCOUNTER — Ambulatory Visit (INDEPENDENT_AMBULATORY_CARE_PROVIDER_SITE_OTHER): Payer: Medicare Other | Admitting: Internal Medicine

## 2022-04-05 VITALS — BP 118/72 | HR 59 | Resp 18 | Ht 64.0 in

## 2022-04-05 DIAGNOSIS — I480 Paroxysmal atrial fibrillation: Secondary | ICD-10-CM

## 2022-04-05 DIAGNOSIS — L0592 Pilonidal sinus without abscess: Secondary | ICD-10-CM

## 2022-04-05 DIAGNOSIS — Z1211 Encounter for screening for malignant neoplasm of colon: Secondary | ICD-10-CM

## 2022-04-05 DIAGNOSIS — Z5181 Encounter for therapeutic drug level monitoring: Secondary | ICD-10-CM | POA: Diagnosis not present

## 2022-04-05 DIAGNOSIS — F419 Anxiety disorder, unspecified: Secondary | ICD-10-CM | POA: Insufficient documentation

## 2022-04-05 LAB — POCT INR: INR: 2.1 (ref 2.0–3.0)

## 2022-04-05 NOTE — Progress Notes (Signed)
Acute Office Visit  Subjective:    Patient ID: Heather [name], female    DOB: Nov 04, 1946, 76 y.o.   MRN: [id number]  Chief Complaint  Patient presents with   Acute Visit    Pt has spot right at the top of buttocks noticed 04-03-22 would like referral to general surgeon     HPI Patient is in today for complaint of slight rectal discomfort for the last 3 days.  She has noticed a bump over her right perianal area, but denies any local discharge or bleeding.  She has remote history of pilonidal cyst and likely IR abscess from it.  She had to get I&D for it.  She currently denies any fever, chills or malaise.  Past Medical History:  Diagnosis Date   Adrenal adenoma    Allergy    Anemia    Anxiety    CAD (coronary artery disease)    a. 50-60% mid LAD at cardiac catheterization 2014 [facility]) b. non-obstructive CAD by cath in 09/2018   Cataract    Depression    DJD (degenerative joint disease) of cervical spine 09/12/2016   H. pylori infection 07/19/2017   History of cardiomyopathy    History of COVID-19 12/10/2019   History of tobacco use    Hyperlipidemia    Mid back pain 02/08/2017   Neuromuscular disorder (HCC)    PAF (paroxysmal atrial fibrillation) ([place])    Diagnosis July 2017 - spontaneously resolved   Smell disturbance 06/13/2021   Statin intolerance     Past Surgical History:  Procedure Laterality Date   ABDOMINAL HYSTERECTOMY  1980   endometriosis   BIOPSY  07/18/2021   Procedure: BIOPSY;  Surgeon: Harvel Quale, MD;  [facility];  Service: Gastroenterology;;   CATARACT EXTRACTION Left 11/2018   ESOPHAGOGASTRODUODENOSCOPY (EGD) WITH PROPOFOL N/A 07/18/2021   Procedure: ESOPHAGOGASTRODUODENOSCOPY (EGD) WITH PROPOFOL;  Surgeon: Harvel Quale, MD;  [facility];  Service: Gastroenterology;  Laterality: N/A;  12:00   INTRAVASCULAR PRESSURE WIRE/FFR STUDY N/A 06/01/2021   Procedure: INTRAVASCULAR PRESSURE WIRE/FFR STUDY;  Surgeon:  Belva Crome, MD;  Location: [place] [facility];  Service: Cardiovascular;  Laterality: N/A;   LEFT HEART CATH AND CORONARY ANGIOGRAPHY N/A 09/25/2018   Procedure: LEFT HEART CATH AND CORONARY ANGIOGRAPHY;  Surgeon: Belva Crome, MD;  Location: [place] [facility];  Service: Cardiovascular;  Laterality: N/A;   LEFT HEART CATH AND CORONARY ANGIOGRAPHY N/A 06/01/2021   Procedure: LEFT HEART CATH AND CORONARY ANGIOGRAPHY;  Surgeon: Belva Crome, MD;  Location: [place] [facility];  Service: Cardiovascular;  Laterality: N/A;   SAVORY DILATION  07/18/2021   Procedure: SAVORY DILATION;  Surgeon: Montez Morita, Quillian Quince, MD;  [facility];  Service: Gastroenterology;;   TONSILLECTOMY  1954    Family History  Problem Relation Age of Onset   COPD Mother    Hearing loss Mother    Stroke Mother    Heart disease Mother        chf, died at 60   Arthritis Father    Hypertension Father    Heart disease Father        cad, pvd, died at 25   Heart disease Sister        heart valve   Arthritis Sister    Hyperlipidemia Brother    Hypertension Brother    Cancer - Prostate Brother     Social History   Socioeconomic History   Marital status: Married  Spouse name: Al   Number of children: 0   Years of education: 18   Highest education level: Not on file  Occupational History   Occupation: Probation officer    Comment: from home  Tobacco Use   Smoking status: Former    Packs/day: 0.00    Types: Cigarettes    Start date: 11/12/1965    Quit date: 09/06/2008    Years since quitting: 13.5   Smokeless tobacco: Never  Vaping Use   Vaping Use: Never used  Substance and Sexual Activity   Alcohol use: Yes    Alcohol/week: 1.0 standard drink    Types: 1 Glasses of wine per week    Comment: occ   Drug use: Yes    Types: Marijuana    Comment: rarely   Sexual activity: Yes    Birth control/protection: Post-menopausal  Other Topics Concern   Not on file  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   Lives with husband AL   No children   Healthy diet and lifestyle   Social Determinants of Health   Financial Resource Strain: Low Risk    Difficulty of Paying Living Expenses: Not hard at all  Food Insecurity: No Food Insecurity   Worried About Charity fundraiser in the Last Year: Never true   Ran Out of Food in the Last Year: Never true  Transportation Needs: No Transportation Needs   Lack of Transportation (Medical): No   Lack of Transportation (Non-Medical): No  Physical Activity: Insufficiently Active   Days of Exercise per Week: 3 days   Minutes of Exercise per Session: 30 min  Stress: No Stress Concern Present   Feeling of Stress : Only a little  Social Connections: Moderately Isolated   Frequency of Communication with Friends and Family: More than three times a week   Frequency of Social Gatherings with Friends and Family: Twice a week   Attends Religious Services: Never   Marine scientist or Organizations: No   Attends Music therapist: Never   Marital Status: Married  Human resources officer Violence: Not At Risk   Fear of Current or Ex-Partner: No   Emotionally Abused: No   Physically Abused: No   Sexually Abused: No    Outpatient Medications Prior to Visit  Medication Sig Dispense Refill   Biotin 10000 MCG TABS Take 10,000 mcg by mouth daily at 12 noon.     buPROPion (WELLBUTRIN SR) 150 MG 12 hr tablet TAKE ONE TABLET BY MOUTH TWICE A DAY 180 tablet 1   Cholecalciferol (VITAMIN D3) 125 MCG (5000 UT) TABS Take 5,000 Units by mouth daily at 12 noon.     Coenzyme Q10 100 MG capsule Take 100 mg by mouth daily at 12 noon.     diazepam (VALIUM) 5 MG tablet Take 1 tablet (5 mg total) by mouth daily as needed for anxiety or muscle spasms. 30 tablet 0   docusate sodium (COLACE) 100 MG capsule Take 100 mg by mouth 2 (two) times daily.      esomeprazole (NEXIUM) 40 MG capsule TAKE 1 CAPSULE(40 MG) BY MOUTH DAILY BEFORE AND BREAKFAST 30 capsule 2    estradiol (ESTRACE) 2 MG tablet Take 1 tablet (2 mg total) by mouth daily. (Patient taking differently: Take 3 mg by mouth daily.) 90 tablet 1   famotidine (PEPCID) 20 MG tablet One after supper 30 tablet 11   nitrofurantoin, macrocrystal-monohydrate, (MACROBID) 100 MG capsule Take 1 capsule (100 mg total) by mouth  2 (two) times daily. (Patient taking differently: Take 100 mg by mouth daily as needed (cystitis flare).) 60 capsule 0   nitroGLYCERIN (NITROSTAT) 0.4 MG SL tablet Place 1 tablet under the tongue every 5 minutes as needed for chest pain. 25 tablet 1   oxyCODONE-acetaminophen (PERCOCET/ROXICET) 5-325 MG tablet TAKE 1 TABLET DAILY AS NEEDED FOR SEVERE PAIN. 30 tablet 0   progesterone (PROMETRIUM) 200 MG capsule Take 1 capsule (200 mg total) by mouth daily. 90 capsule 1   valACYclovir (VALTREX) 1000 MG tablet Take 1 tablet (1,000 mg total) by mouth 3 (three) times daily. 30 tablet 0   warfarin (COUMADIN) 5 MG tablet Take one to one and one-half tablets by mouth daily or as directed. 135 tablet 1   No facility-administered medications prior to visit.    Allergies  Allergen Reactions   Statins Other (See Comments)    Muscle weakness   Other     EKG pads causes blisters    Sulfa Antibiotics Rash    Review of Systems  Constitutional:  Negative for chills and fever.  Gastrointestinal:  Negative for blood in stool, constipation, diarrhea, nausea and vomiting.       Bump over perianal area  Genitourinary:  Negative for dysuria and hematuria.  Skin:  Negative for rash.      Objective:    Physical Exam Constitutional:      General: She is not in acute distress.    Appearance: She is not diaphoretic.  Eyes:     General: No scleral icterus.    Extraocular Movements: Extraocular movements intact.  Skin:    Comments: Small papule over right perianal area, could be pilonidal tract opening  Neurological:     Mental Status: She is alert.    BP 118/72 (BP Location: Right Arm,  Patient Position: Sitting, Cuff Size: Normal)   Pulse (!) 59   Resp 18   Ht '5\' 4"'$  (1.626 m)   LMP 07/08/1979 (Approximate)   SpO2 100%   BMI 26.61 kg/m  Wt Readings from Last 3 Encounters:  03/27/22 155 lb (70.3 kg)  10/09/21 150 lb (68 kg)  07/31/21 155 lb 0.6 oz (70.3 kg)        Assessment & Plan:   Problem List Items Addressed This Visit    Visit Diagnoses     Pilonidal sinus    -  Primary Small papule like structure in the perianal area Has remote history of pilonidal cyst and/or IR abscess Referred to general surgery for further further eval   Relevant Orders   Ambulatory referral to General Surgery        No orders of the defined types were placed in this encounter.    Lindell Spar, MD

## 2022-04-05 NOTE — Patient Instructions (Addendum)
Description   Continue taking warfarin to 1 1/2 tablets daily Recheck INR in 4 weeks.

## 2022-04-05 NOTE — Progress Notes (Signed)
ERRONEOUS ENCOUNTER

## 2022-04-06 ENCOUNTER — Ambulatory Visit (HOSPITAL_COMMUNITY)
Admission: RE | Admit: 2022-04-06 | Discharge: 2022-04-06 | Disposition: A | Discharge status: Home / Self Care | Payer: Medicare Other | Source: Ambulatory Visit | Attending: Internal Medicine | Admitting: Internal Medicine

## 2022-04-06 DIAGNOSIS — Z87891 Personal history of nicotine dependence: Secondary | ICD-10-CM | POA: Diagnosis not present

## 2022-04-06 DIAGNOSIS — F17211 Nicotine dependence, cigarettes, in remission: Secondary | ICD-10-CM | POA: Insufficient documentation

## 2022-04-06 IMAGING — CT CT CHEST LUNG CANCER SCREENING LOW DOSE W/O CM
2 of 5 series · 15 of 40 positions shown, 18 images · non-contrast
Comparison: None Available.

CLINICAL DATA: Former smoker, quit 10 years ago, 40 pack-year
history.



[Series 4: lungs · axial · 0.76mm/px · z∈[+1243,+1529]mm · 12 of 316 slices shown, 15 images]
[im 15/316  mediastinal]
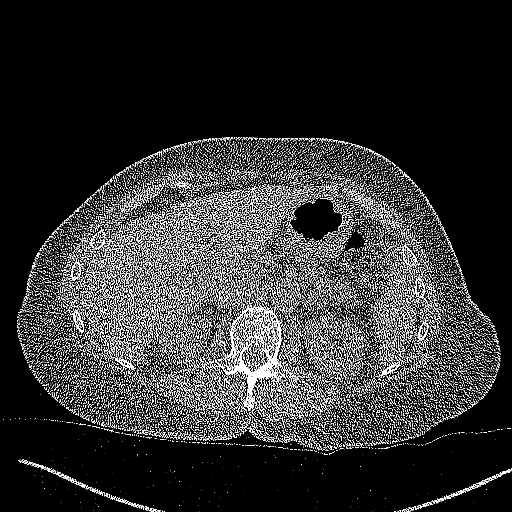
[im 15/316  lung]
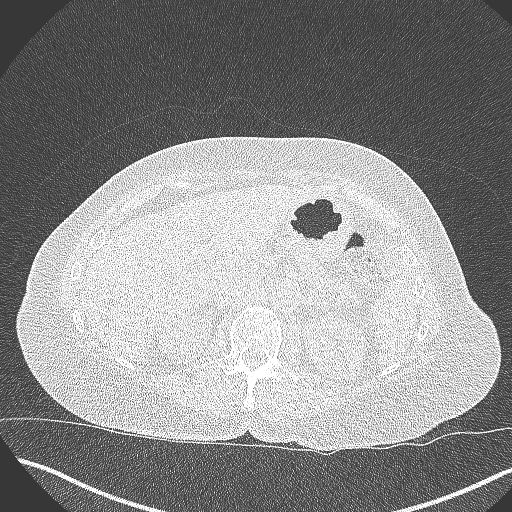
[im 43/316  lung]
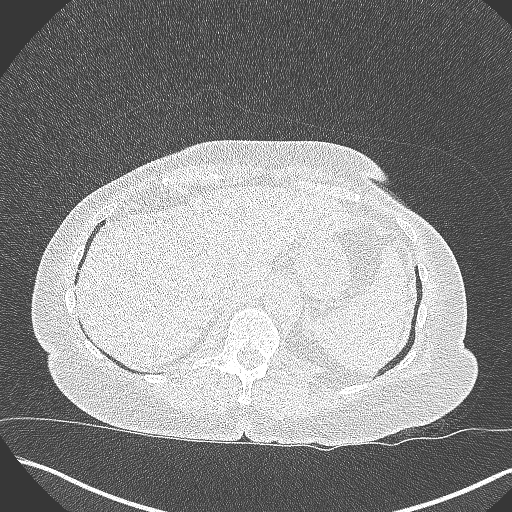
[im 72/316  lung]
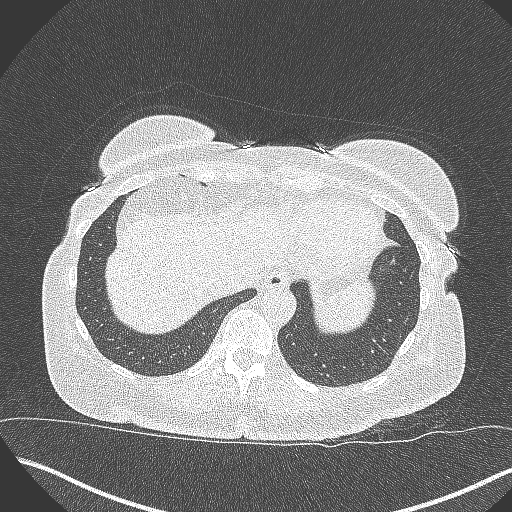
[im 101/316  lung]
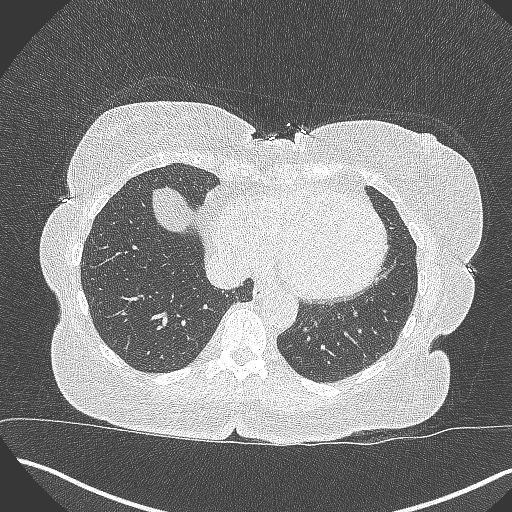
[im 115/316  mediastinal]
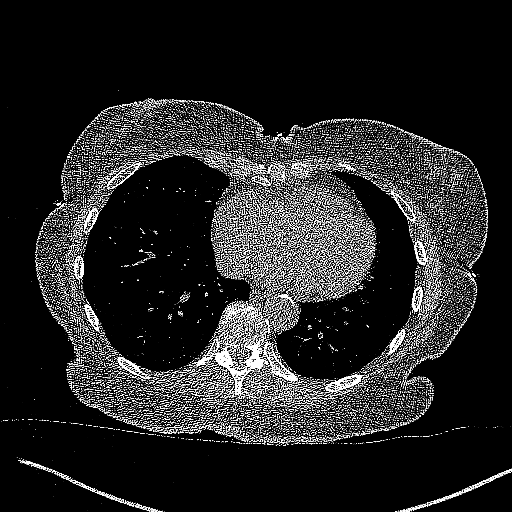
[im 115/316  lung]
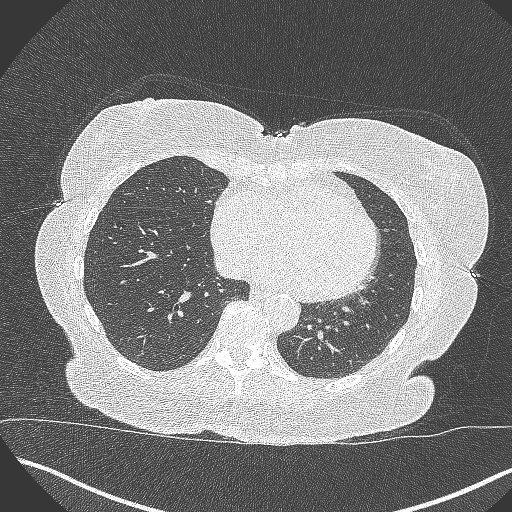
[im 144/316  lung]
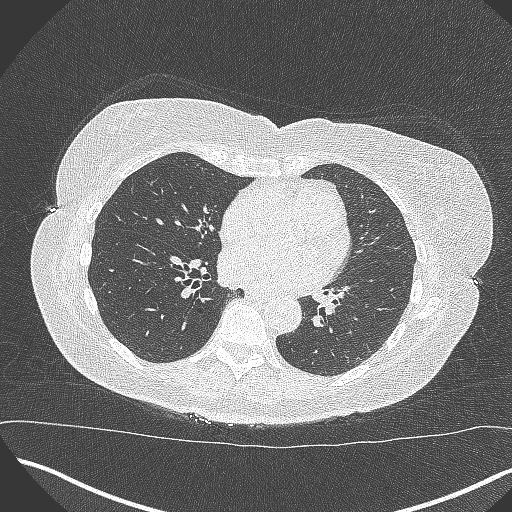
[im 172/316  lung]
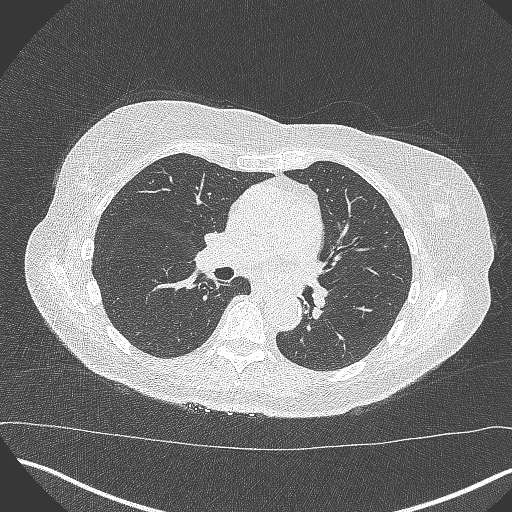
[im 201/316  lung]
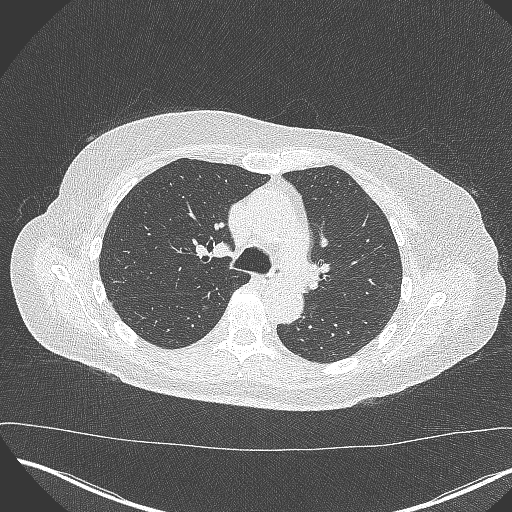
[im 215/316  mediastinal]
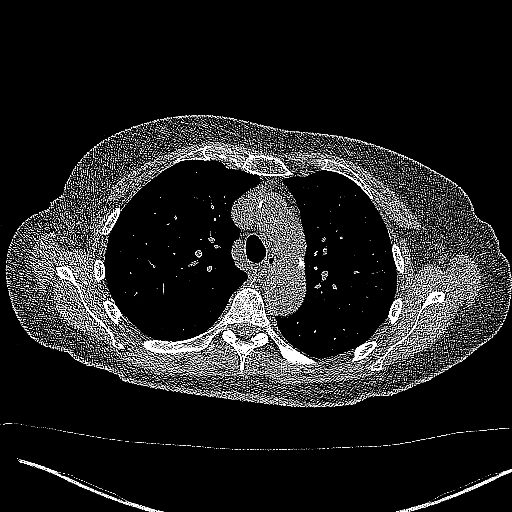
[im 215/316  lung]
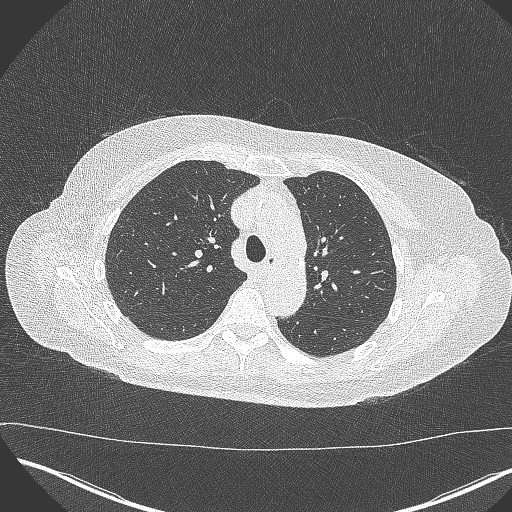
[im 244/316  lung]
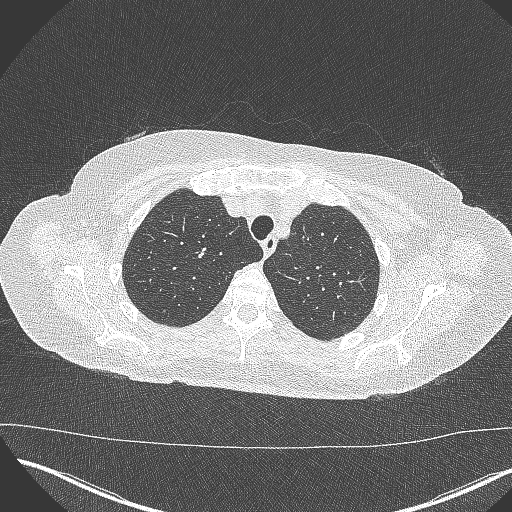
[im 273/316  lung]
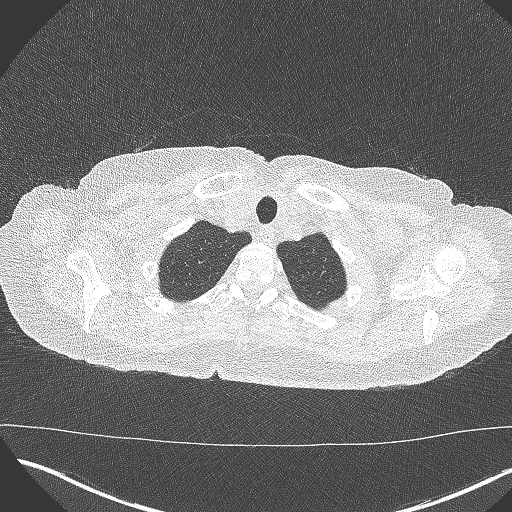
[im 301/316  lung]
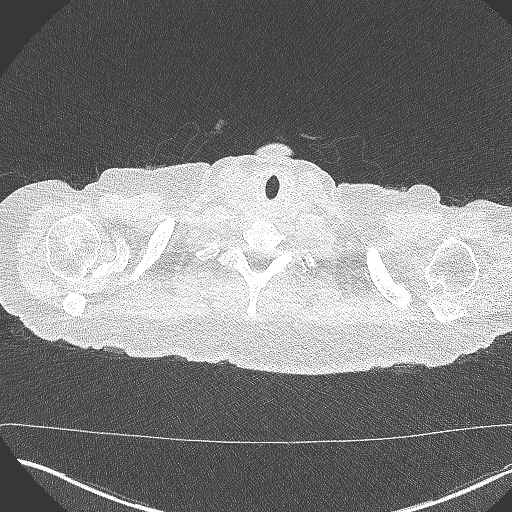

[Series 5: coronal · coronal · 0.64mm/px · 3 of 270 slices shown]
[im 54/270  lung]
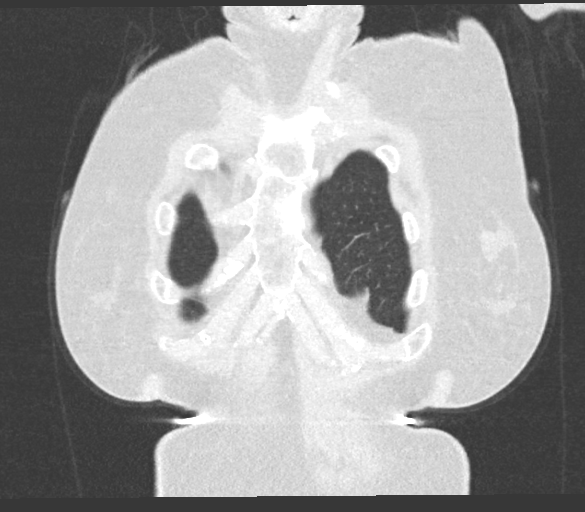
[im 108/270  lung]
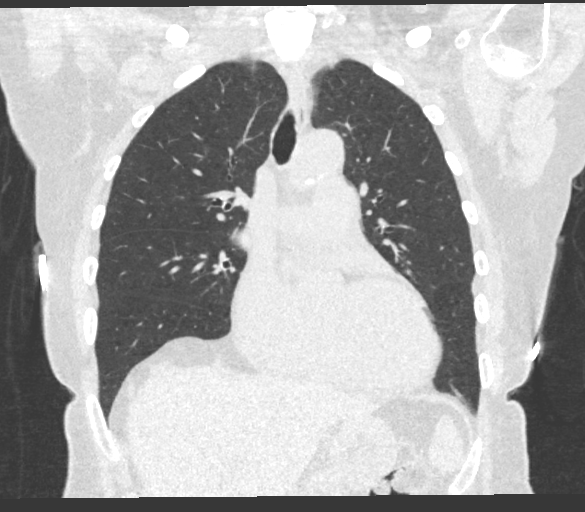
[im 162/270  lung]
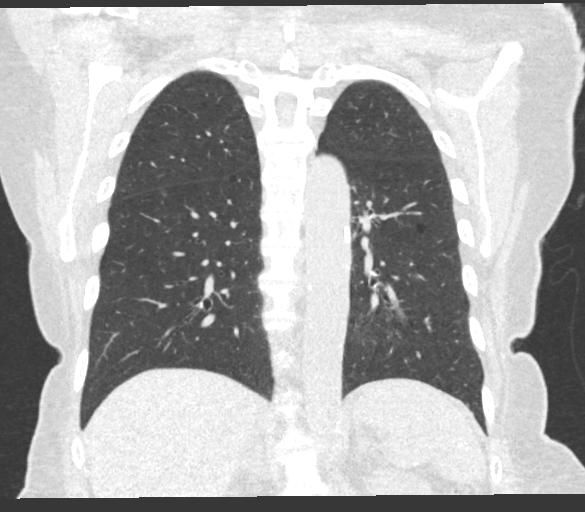

[15 of 40 positions shown; findings below may reference images not displayed]

FINDINGS: Cardiovascular: Atherosclerotic calcification of the aorta, aortic
valve and coronary arteries. Enlarged pulmonic trunk. Heart is at
the upper limits of normal in size. No pericardial effusion.

Mediastinum/Nodes: No pathologically enlarged mediastinal or
axillary lymph nodes. Hilar regions are difficult to definitively
evaluate without IV contrast. Esophagus is grossly unremarkable.

Lungs/Pleura: Centrilobular emphysema. No suspicious pulmonary
nodules. No pleural fluid. Airway is unremarkable.

Upper Abdomen: Visualized portion of the liver is unremarkable. Mild
thickening of the adrenal glands. No specific follow-up necessary.
Visualized portions of the kidneys, spleen, pancreas, stomach and
bowel are grossly unremarkable.

Musculoskeletal: Degenerative changes in the spine. Bone island in
the right third rib. No worrisome lytic or sclerotic lesions.
IMPRESSION: 1. Lung-RADS 1, negative. Continue annual screening with low-dose
chest CT without contrast in 12 months.
2. Aortic atherosclerosis ([J6]-[J6]). Coronary artery
calcification.
3. Enlarged pulmonic trunk, indicative of pulmonary arterial
hypertension.
4.  Emphysema ([J6]-[J6]).

## 2022-04-09 ENCOUNTER — Telehealth: Payer: Self-pay | Admitting: Neurology

## 2022-04-10 ENCOUNTER — Telehealth: Payer: Self-pay | Admitting: Internal Medicine

## 2022-04-10 MED ORDER — OXYCODONE-ACETAMINOPHEN 5-325 MG PO TABS: ORAL_TABLET | ORAL | 0 refills | Status: DC

## 2022-04-10 NOTE — Telephone Encounter (Signed)
Last OV was on 07/03/21.  Next OV is scheduled for 06/20/22.  Last RX was written on 03/05/22 for 30 tabs.   [place] [facility] Database has been reviewed.

## 2022-04-10 NOTE — Telephone Encounter (Signed)
2nd attempt to give CT results LVM

## 2022-04-10 NOTE — Telephone Encounter (Signed)
Returned your call - I had sent Chat msg

## 2022-04-10 NOTE — Telephone Encounter (Signed)
Called husband back, informed him Dr. Felecia Shelling sent in refill for oxycodone today. He verbalized understanding and appreciation.

## 2022-04-10 NOTE — Telephone Encounter (Signed)
Patient's husband left a voicemail with our office this morning asking on behalf of the patient if her prescription was sent to [facility].  He did not indicate which prescription he was referring to.

## 2022-04-11 ENCOUNTER — Telehealth: Payer: Self-pay | Admitting: Internal Medicine

## 2022-04-11 NOTE — Telephone Encounter (Signed)
Pt returned call in regards to test results

## 2022-04-11 NOTE — Telephone Encounter (Signed)
Pt advised with verbal understandiing

## 2022-04-12 DIAGNOSIS — L820 Inflamed seborrheic keratosis: Secondary | ICD-10-CM | POA: Diagnosis not present

## 2022-04-18 ENCOUNTER — Other Ambulatory Visit (HOSPITAL_COMMUNITY): Payer: Self-pay | Admitting: Internal Medicine

## 2022-04-18 DIAGNOSIS — Z1231 Encounter for screening mammogram for malignant neoplasm of breast: Secondary | ICD-10-CM

## 2022-04-24 ENCOUNTER — Telehealth (INDEPENDENT_AMBULATORY_CARE_PROVIDER_SITE_OTHER): Payer: Self-pay

## 2022-04-24 ENCOUNTER — Encounter (INDEPENDENT_AMBULATORY_CARE_PROVIDER_SITE_OTHER): Payer: Self-pay

## 2022-04-24 MED ORDER — PEG 3350-KCL-NA BICARB-NACL 420 G PO SOLR: 4000.0000 mL | ORAL | 0 refills | Status: DC

## 2022-04-24 NOTE — Telephone Encounter (Signed)
Ok to schedule.  Thanks,  Evellyn Tuff Castaneda Mayorga, MD Gastroenterology and Hepatology [facility]  

## 2022-04-24 NOTE — Telephone Encounter (Signed)
Heather [name] Heather [name], CMA  ?

## 2022-04-24 NOTE — Telephone Encounter (Signed)
Referring MD/PCP: Posey Pronto  Procedure: Tcs  Reason/Indication:  Screening  Has patient had this procedure before?  Yes   If so, when, by whom and where?  05/2011  Is there a family history of colon cancer?  No   Who?  What age when diagnosed?    Is patient diabetic? If yes, Type 1 or Type 2   no       Does patient have prosthetic heart valve or mechanical valve?  no  Do you have a pacemaker/defibrillator?   no  Has patient ever had endocarditis/atrial fibrillation? no  Does patient use oxygen? no  Has patient had joint replacement within last 12 months?  no  Is patient constipated or do they take laxatives? yes  Does patient have a history of alcohol/drug use?  no  Have you had a stroke/heart attack last 6 mths? no  Do you take medicine for weight loss?  no  For female patients,: have you had a hysterectomy no                      are you post menopausal yes                      do you still have your menstrual cycle no  Is patient on blood thinner such as Coumadin, Plavix and/or Aspirin? Yes   Medications: Warfarin 5 mg daily (Dr Domenic Polite) Welbutrin 150 mg bid, progesterone 200 mg , estradiol 2 mg, biotin daily, Vit E3 125 mcg daily, Colace 100 mg bid, Co Q 10 100 mg daily  Allergies: Statins, Sulfur, Antibiotics  Medication Adjustment per Dr Jenetta Downer HOLD WARFARIN 5 DAYS PRIOR   Procedure date & time: 05/23/22 AT 11:15

## 2022-04-25 ENCOUNTER — Telehealth: Payer: Self-pay

## 2022-04-25 NOTE — Telephone Encounter (Signed)
   Pre-operative Risk Assessment    Patient Name: Heather [name]  DOB: 08/05/1946 MRN: [id number]      Request for Surgical Clearance    Procedure:   Colonoscopy  Date of Surgery:  Clearance 05/23/22                                 Surgeon:  Dr. Maylon Peppers Surgeon's Group or Practice Name:  [facility] Phone number:  [phone] Fax number:  [phone]   Type of Clearance Requested:   - Medical  - Pharmacy:  Hold Warfarin (Coumadin)     Type of Anesthesia:  MAC   Additional requests/questions:    Signed, Chanese Hartsough   04/25/2022, 8:46 AM

## 2022-04-26 NOTE — Telephone Encounter (Signed)
Patient with diagnosis of afib on warfarin for anticoagulation.    Procedure: colonoscopy Date of procedure: 05/23/22  CHA2DS2-VASc Score =     This indicates a  % annual risk of stroke. The patient's score is based upon: CHF History: 0 HTN History: 0 Stroke History: 0 Vascular Disease History: 1 Age Score: 2 Gender Score: 1      There is TIA noted on patients problem list. Cannot find any evidence of this. Patient previously cleared by Dr. Domenic Polite to hold warfarin without a bridge.   Per office protocol, patient can hold warfarin for 5 days prior to procedure.   Patient will NOT need bridging with Lovenox (enoxaparin) around procedure.

## 2022-04-26 NOTE — Telephone Encounter (Signed)
Primary Cardiologist:Samuel Domenic Polite, MD  Chart reviewed as part of pre-operative protocol coverage. Because of Maegen Wigle past medical history and time since last visit, he/she will require a virtual visit/telephone call in order to better assess preoperative cardiovascular risk.  Pre-op covering staff: - Please contact patient, obtain consent, and schedule appointment   Request regarding holding anticoagulant has been addressed by Salli Real, NP-C    04/26/2022, 9:38 AM [place] [address], [address] Office [phone] Fax [phone]

## 2022-04-27 NOTE — Telephone Encounter (Signed)
Tried to call the pt though the call would not go through. The phone did not ring at all.

## 2022-05-01 NOTE — Telephone Encounter (Signed)
I was able today to leave a message. Left message to call back to set up a tele pre op appt,.

## 2022-05-02 ENCOUNTER — Ambulatory Visit (HOSPITAL_COMMUNITY)
Admission: RE | Admit: 2022-05-02 | Discharge: 2022-05-02 | Disposition: A | Discharge status: Home / Self Care | Payer: Medicare Other | Source: Ambulatory Visit | Attending: Internal Medicine | Admitting: Internal Medicine

## 2022-05-02 DIAGNOSIS — Z1231 Encounter for screening mammogram for malignant neoplasm of breast: Secondary | ICD-10-CM | POA: Insufficient documentation

## 2022-05-02 IMAGING — MG MM DIGITAL SCREENING BILAT W/ TOMO AND CAD
6 of 10 series · 6 of 30 positions shown · non-contrast
Comparison: Previous exam(s).

CLINICAL DATA: Screening.

EXAM:
DIGITAL SCREENING BILATERAL MAMMOGRAM WITH TOMOSYNTHESIS AND CAD
TECHNIQUE: Bilateral screening digital craniocaudal and mediolateral oblique
mammograms were obtained. Bilateral screening digital breast
tomosynthesis was performed. The images were evaluated with
computer-aided detection.

[L CC synth-2D (1 of 2)]
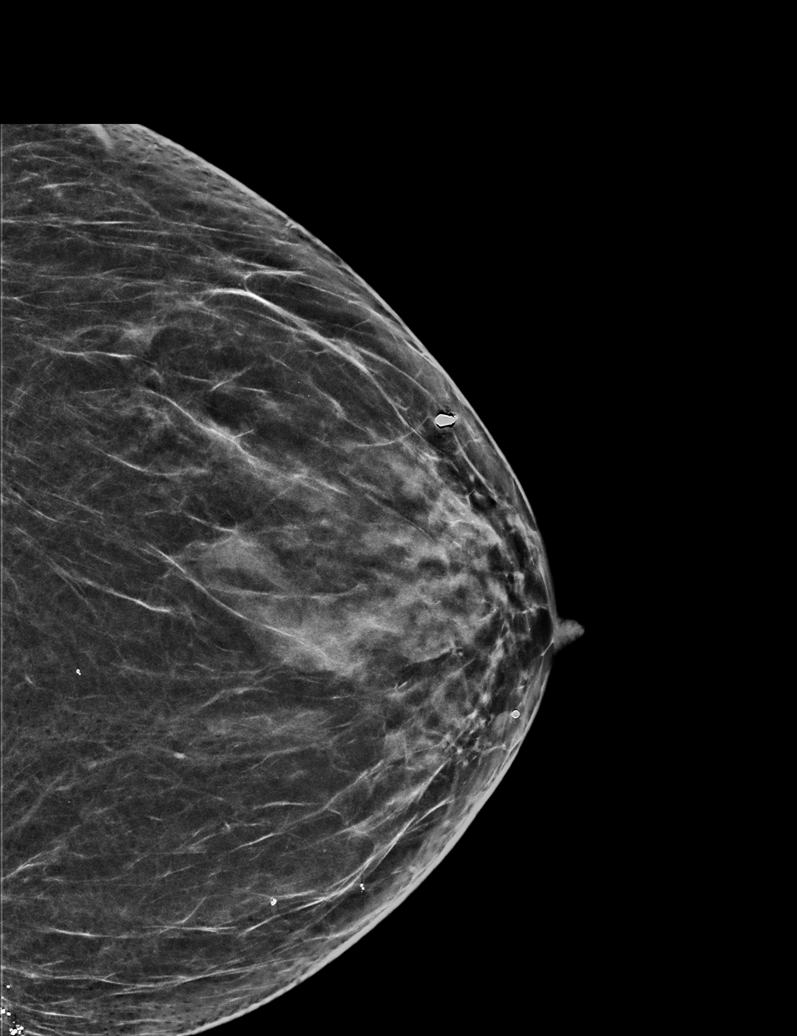

[R CC synth-2D]
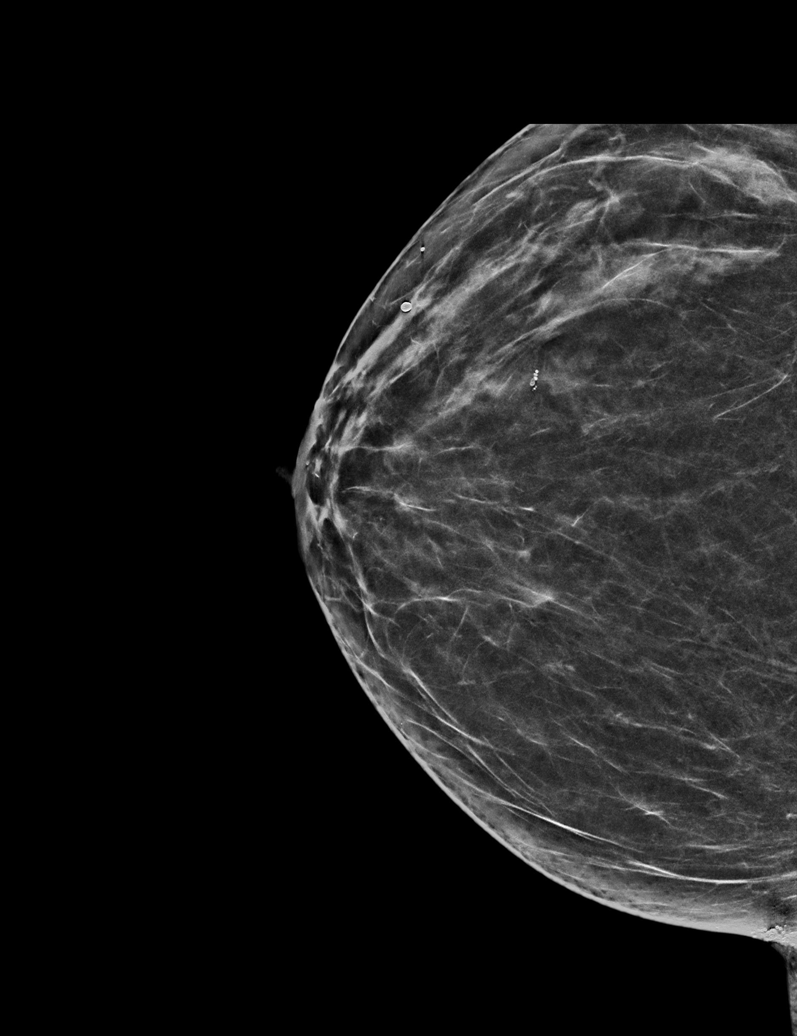

[L CC synth-2D (2 of 2)]
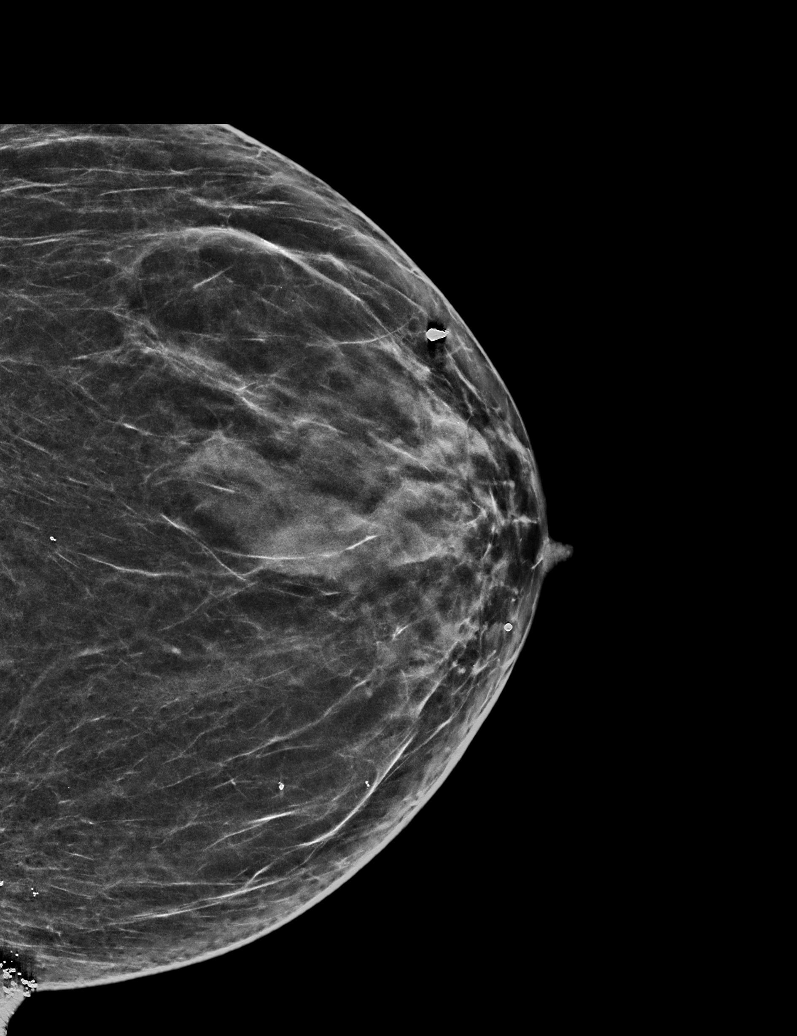

[R MLO synth-2D]
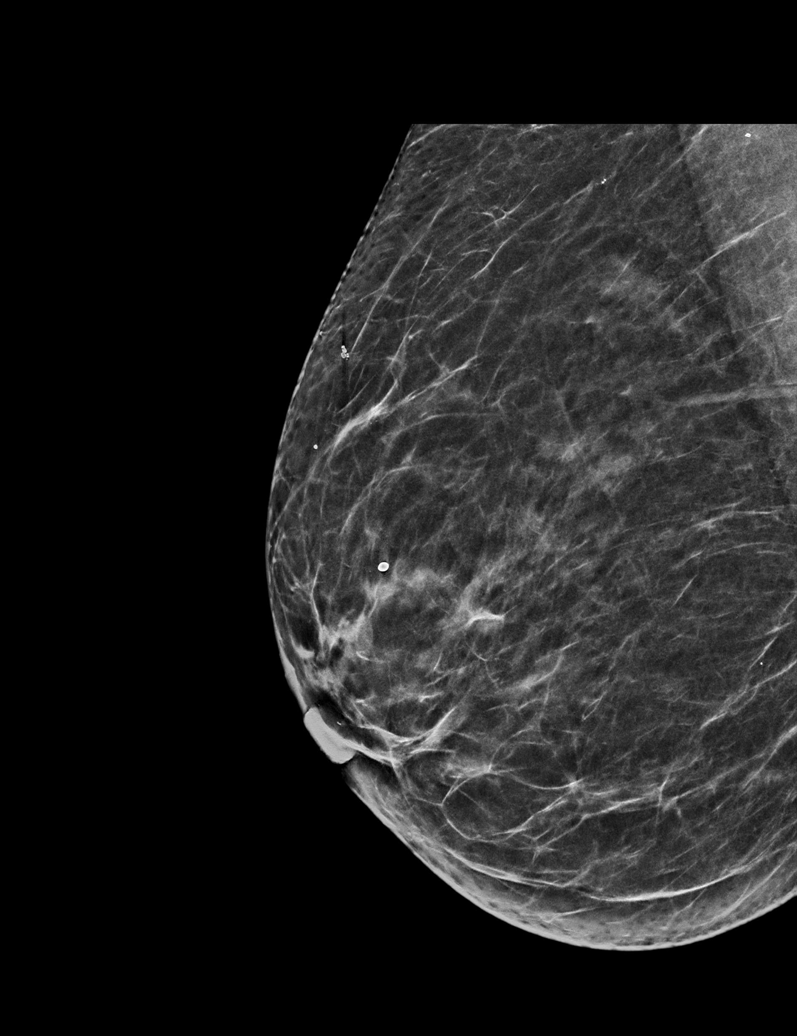

[L MLO synth-2D]
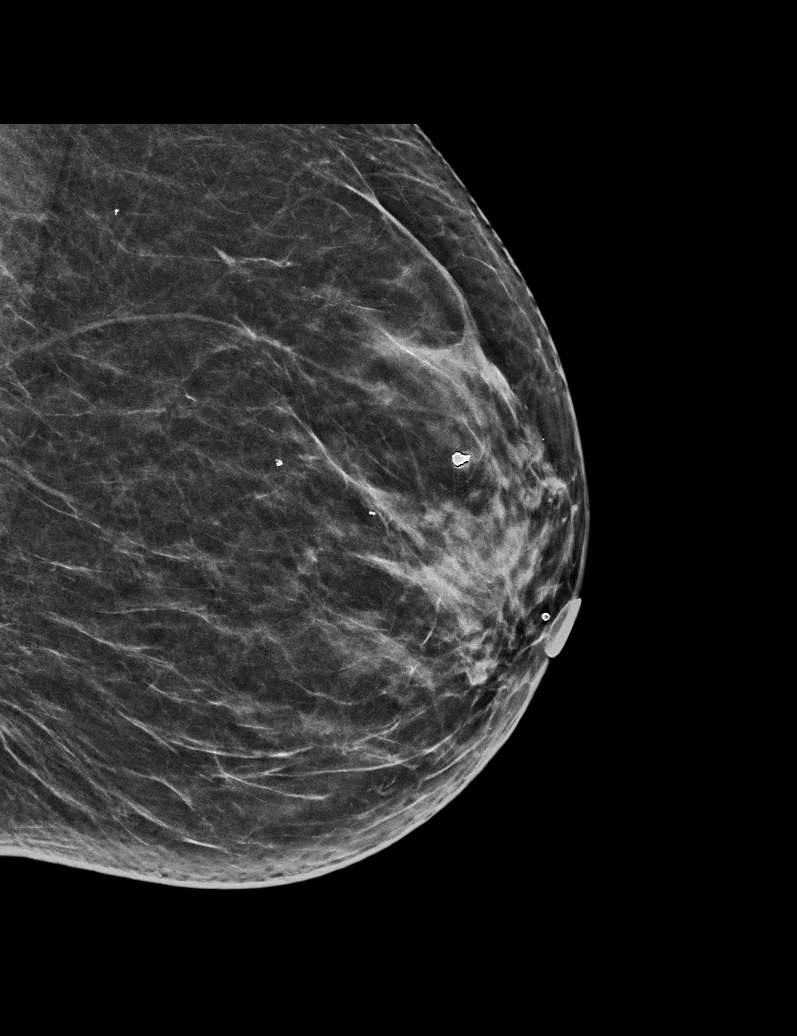

[R MLO tomo · tomo slice 31/60.0]
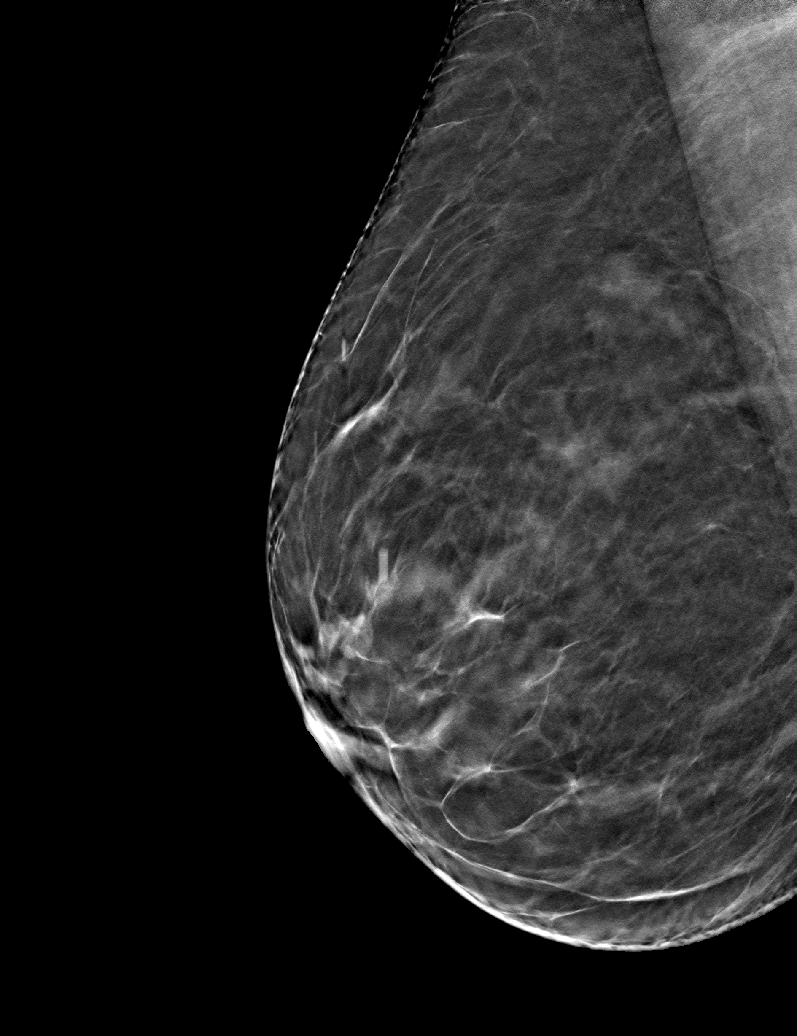

[6 of 30 positions shown; findings below may reference images not displayed]

ACR Breast Density Category b: There are scattered areas of
fibroglandular density.
FINDINGS: There are no findings suspicious for malignancy.
IMPRESSION: No mammographic evidence of malignancy. A result letter of this
screening mammogram will be mailed directly to the patient.

RECOMMENDATION:
Screening mammogram in one year. (Code:[BY])

BI-RADS CATEGORY  1: Negative.

## 2022-05-02 NOTE — Telephone Encounter (Signed)
2nd attempt to reach pt regarding surgical clearance and need for a televisit, left a message for pt to call back and ask for the preop team.

## 2022-05-03 ENCOUNTER — Ambulatory Visit (INDEPENDENT_AMBULATORY_CARE_PROVIDER_SITE_OTHER): Payer: Medicare Other | Admitting: General Surgery

## 2022-05-03 ENCOUNTER — Encounter: Payer: Self-pay | Admitting: General Surgery

## 2022-05-03 VITALS — BP 103/68 | HR 53 | Temp 97.8°F | Resp 12 | Ht 64.0 in | Wt 155.0 lb

## 2022-05-03 DIAGNOSIS — L729 Follicular cyst of the skin and subcutaneous tissue, unspecified: Secondary | ICD-10-CM | POA: Diagnosis not present

## 2022-05-03 NOTE — Progress Notes (Incomplete)
[facility] History and Physical  Reason for Referral:*** Referring Physician: ***  Chief Complaint   New Patient (Initial Visit)     Heather [name] is a 76 y.o. female.  HPI: ***.  The *** started *** and has had a duration of ***.  It is associated with ***.  The *** is improved with ***, and is made worse with ***.    Quality*** Context***  Past Medical History:  Diagnosis Date  . Adrenal adenoma   . Allergy   . Anemia   . Anxiety   . CAD (coronary artery disease)    a. 50-60% mid LAD at cardiac catheterization 2014 [facility]) b. non-obstructive CAD by cath in 09/2018  . Cataract   . Depression   . DJD (degenerative joint disease) of cervical spine 09/12/2016  . H. pylori infection 07/19/2017  . History of cardiomyopathy   . History of COVID-19 12/10/2019  . History of tobacco use   . Hyperlipidemia   . Mid back pain 02/08/2017  . Neuromuscular disorder ([place])   . PAF (paroxysmal atrial fibrillation) [facility])    Diagnosis July 2017 - spontaneously resolved  . Smell disturbance 06/13/2021  . Statin intolerance     Past Surgical History:  Procedure Laterality Date  . ABDOMINAL HYSTERECTOMY  1980   endometriosis  . BIOPSY  07/18/2021   Procedure: BIOPSY;  Surgeon: Harvel Quale, MD;  [facility];  Service: Gastroenterology;;  . CATARACT EXTRACTION Left 11/2018  . ESOPHAGOGASTRODUODENOSCOPY (EGD) WITH PROPOFOL N/A 07/18/2021   Procedure: ESOPHAGOGASTRODUODENOSCOPY (EGD) WITH PROPOFOL;  Surgeon: Harvel Quale, MD;  [facility];  Service: Gastroenterology;  Laterality: N/A;  12:00  . INTRAVASCULAR PRESSURE WIRE/FFR STUDY N/A 06/01/2021   Procedure: INTRAVASCULAR PRESSURE WIRE/FFR STUDY;  Surgeon: Belva Crome, MD;  Location: [place] [facility];  Service: Cardiovascular;  Laterality: N/A;  . LEFT HEART CATH AND CORONARY ANGIOGRAPHY N/A 09/25/2018   Procedure: LEFT HEART CATH AND CORONARY ANGIOGRAPHY;  Surgeon:  Belva Crome, MD;  Location: [place] [facility];  Service: Cardiovascular;  Laterality: N/A;  . LEFT HEART CATH AND CORONARY ANGIOGRAPHY N/A 06/01/2021   Procedure: LEFT HEART CATH AND CORONARY ANGIOGRAPHY;  Surgeon: Belva Crome, MD;  Location: [place] [facility];  Service: Cardiovascular;  Laterality: N/A;  . SAVORY DILATION  07/18/2021   Procedure: SAVORY DILATION;  Surgeon: Montez Morita, Quillian Quince, MD;  [facility];  Service: Gastroenterology;;  . TONSILLECTOMY  1954    Family History  Problem Relation Age of Onset  . COPD Mother   . Hearing loss Mother   . Stroke Mother   . Heart disease Mother        chf, died at 62  . Arthritis Father   . Hypertension Father   . Heart disease Father        cad, pvd, died at 82  . Heart disease Sister        heart valve  . Arthritis Sister   . Hyperlipidemia Brother   . Hypertension Brother   . Cancer - Prostate Brother     Social History   Tobacco Use  . Smoking status: Former    Packs/day: 0.00    Types: Cigarettes    Start date: 11/12/1965    Quit date: 09/06/2008    Years since quitting: 13.6  . Smokeless tobacco: Never  Vaping Use  . Vaping Use: Never used  Substance Use Topics  . Alcohol use: Yes  Alcohol/week: 1.0 standard drink of alcohol    Types: 1 Glasses of wine per week    Comment: occ  . Drug use: Yes    Types: Marijuana    Comment: rarely    Medications: {medication reviewed/display:3041432} Allergies as of 05/03/2022       Reactions   Statins Other (See Comments)   Muscle weakness   Other    EKG pads causes blisters    Sulfa Antibiotics Rash        Medication List        Accurate as of May 03, 2022  2:36 PM. If you have any questions, ask your nurse or doctor.          Biotin 10000 MCG Tabs Take 10,000 mcg by mouth daily at 12 noon.   buPROPion 150 MG 12 hr tablet Commonly known as: WELLBUTRIN SR TAKE ONE TABLET BY MOUTH TWICE A DAY   Coenzyme Q10 100 MG capsule Take  100 mg by mouth daily at 12 noon.   diazepam 5 MG tablet Commonly known as: Valium Take 1 tablet (5 mg total) by mouth daily as needed for anxiety or muscle spasms.   docusate sodium 100 MG capsule Commonly known as: COLACE Take 100 mg by mouth 2 (two) times daily.   esomeprazole 40 MG capsule Commonly known as: NEXIUM TAKE 1 CAPSULE(40 MG) BY MOUTH DAILY BEFORE AND BREAKFAST   estradiol 2 MG tablet Commonly known as: ESTRACE Take 1 tablet (2 mg total) by mouth daily. What changed: how much to take   famotidine 20 MG tablet Commonly known as: Pepcid One after supper   nitrofurantoin (macrocrystal-monohydrate) 100 MG capsule Commonly known as: Macrobid Take 1 capsule (100 mg total) by mouth 2 (two) times daily. What changed:  when to take this reasons to take this   nitroGLYCERIN 0.4 MG SL tablet Commonly known as: NITROSTAT Place 1 tablet under the tongue every 5 minutes as needed for chest pain.   oxyCODONE-acetaminophen 5-325 MG tablet Commonly known as: PERCOCET/ROXICET TAKE 1 TABLET DAILY AS NEEDED FOR SEVERE PAIN.   polyethylene glycol-electrolytes 420 g solution Commonly known as: TriLyte Take 4,000 mLs by mouth as directed.   progesterone 200 MG capsule Commonly known as: PROMETRIUM Take 1 capsule (200 mg total) by mouth daily.   valACYclovir 1000 MG tablet Commonly known as: VALTREX Take 1 tablet (1,000 mg total) by mouth 3 (three) times daily.   Vitamin D3 125 MCG (5000 UT) Tabs Take 5,000 Units by mouth daily at 12 noon.   warfarin 5 MG tablet Commonly known as: COUMADIN Take as directed by the anticoagulation clinic. If you are unsure how to take this medication, talk to your nurse or doctor. Original instructions: Take one to one and one-half tablets by mouth daily or as directed.         ROS:  {Review of Systems:30496}  Blood pressure 103/68, pulse (!) 53, temperature 97.8 F (36.6 C), temperature source Oral, resp. rate 12, height '5\' 4"'$   (1.626 m), weight 155 lb (70.3 kg), last menstrual period 07/08/1979, SpO2 97 %. Physical Exam  Results: No results found for this or any previous visit (from the past 48 hour(s)).  MM 3D SCREEN BREAST BILATERAL  Result Date: 05/03/2022 CLINICAL DATA:  Screening. EXAM: DIGITAL SCREENING BILATERAL MAMMOGRAM WITH TOMOSYNTHESIS AND CAD TECHNIQUE: Bilateral screening digital craniocaudal and mediolateral oblique mammograms were obtained. Bilateral screening digital breast tomosynthesis was performed. The images were evaluated with computer-aided detection. COMPARISON:  Previous exam(s). ACR  Breast Density Category b: There are scattered areas of fibroglandular density. FINDINGS: There are no findings suspicious for malignancy. IMPRESSION: No mammographic evidence of malignancy. A result letter of this screening mammogram will be mailed directly to the patient. RECOMMENDATION: Screening mammogram in one year. (Code:SM-B-01Y) BI-RADS CATEGORY  1: Negative. Electronically Signed   By: Everlean Alstrom M.D.   On: 05/03/2022 13:31     Assessment & Plan:  Heather [name] is a 76 y.o. female with *** -*** -*** -Follow up ***  All questions were answered to the satisfaction of the patient and family***.  The risk and benefits of *** were discussed including but not limited to ***.  After careful consideration, Heather [name] has decided to ***.    Virl Cagey 05/03/2022, 2:36 PM

## 2022-05-04 ENCOUNTER — Encounter: Payer: Self-pay | Admitting: General Surgery

## 2022-05-04 DIAGNOSIS — L729 Follicular cyst of the skin and subcutaneous tissue, unspecified: Secondary | ICD-10-CM | POA: Insufficient documentation

## 2022-05-04 NOTE — Telephone Encounter (Signed)
Left message x 3 to reach the pt to set up a tele pre op appt. I will send a letter to the pt to call the office for appt. Will update the requesting office that we have been unable to reach the pt to schedule a televisit for pre op clearance. I will remove from the pre op call back at this time. We will re-address once the pt calls back for appt.

## 2022-05-07 ENCOUNTER — Telehealth: Payer: Self-pay | Admitting: *Deleted

## 2022-05-07 NOTE — Telephone Encounter (Signed)
Thanks, Soledad Gerlach, [phone]

## 2022-05-08 ENCOUNTER — Ambulatory Visit (INDEPENDENT_AMBULATORY_CARE_PROVIDER_SITE_OTHER): Payer: Medicare Other | Admitting: Physician Assistant

## 2022-05-08 ENCOUNTER — Encounter: Payer: Self-pay | Admitting: Physician Assistant

## 2022-05-08 VITALS — BP 110/68

## 2022-05-08 DIAGNOSIS — Z01810 Encounter for preprocedural cardiovascular examination: Secondary | ICD-10-CM | POA: Diagnosis not present

## 2022-05-10 ENCOUNTER — Ambulatory Visit (INDEPENDENT_AMBULATORY_CARE_PROVIDER_SITE_OTHER): Payer: Medicare Other | Admitting: *Deleted

## 2022-05-10 DIAGNOSIS — Z5181 Encounter for therapeutic drug level monitoring: Secondary | ICD-10-CM

## 2022-05-10 DIAGNOSIS — I480 Paroxysmal atrial fibrillation: Secondary | ICD-10-CM

## 2022-05-10 LAB — POCT INR: INR: 3.5 — AB (ref 2.0–3.0)

## 2022-05-10 NOTE — Patient Instructions (Signed)
Hold warfarin tonight then continue 1 1/2 tablets daily Pending colonoscopy on 7/12.  Will hold warfarin 5 days before procedure. (7/7 - 7/11) and resume above dose night of procedure if OK with MD. Recheck INR 06/04/22.

## 2022-05-16 ENCOUNTER — Encounter (INDEPENDENT_AMBULATORY_CARE_PROVIDER_SITE_OTHER): Payer: Self-pay

## 2022-05-16 ENCOUNTER — Other Ambulatory Visit: Payer: Self-pay | Admitting: Neurology

## 2022-05-16 MED ORDER — OXYCODONE-ACETAMINOPHEN 5-325 MG PO TABS
ORAL_TABLET | ORAL | 0 refills | Status: DC
Start: 2022-05-16 — End: 2022-06-28

## 2022-05-16 NOTE — Telephone Encounter (Signed)
Last OV was on 03/27/22.  Next OV is scheduled for 06/20/22.  Last RX was written on 04/10/22 for 30 tabs.   [place] Drug Database has been reviewed.

## 2022-05-24 ENCOUNTER — Telehealth: Payer: Self-pay | Admitting: *Deleted

## 2022-05-24 NOTE — Telephone Encounter (Signed)
Pt Called.  Colonoscopy was rescheduled until next month.  She has not missed any warfarin.  Told to continue current dose of warfarin (1 1/2 tablets daily) until next INR check.  She verbalized understanding.

## 2022-06-04 ENCOUNTER — Ambulatory Visit (INDEPENDENT_AMBULATORY_CARE_PROVIDER_SITE_OTHER): Payer: Medicare Other | Admitting: *Deleted

## 2022-06-04 DIAGNOSIS — I480 Paroxysmal atrial fibrillation: Secondary | ICD-10-CM | POA: Diagnosis not present

## 2022-06-04 DIAGNOSIS — Z5181 Encounter for therapeutic drug level monitoring: Secondary | ICD-10-CM | POA: Diagnosis not present

## 2022-06-04 LAB — POCT INR: INR: 4.2 — AB (ref 2.0–3.0)

## 2022-06-04 NOTE — Patient Instructions (Signed)
Hold warfarin tonight then decrease dose to 1 1/2 tablets daily except 1 tablet on Tuesdays and Fridays Pending colonoscopy on 8/16.  Will hold warfarin 5 days before procedure.  Recheck INR 06/18/22.

## 2022-06-13 ENCOUNTER — Encounter (INDEPENDENT_AMBULATORY_CARE_PROVIDER_SITE_OTHER): Payer: Self-pay | Admitting: Gastroenterology

## 2022-06-14 ENCOUNTER — Telehealth (INDEPENDENT_AMBULATORY_CARE_PROVIDER_SITE_OTHER): Payer: Self-pay

## 2022-06-14 MED ORDER — PEG 3350-KCL-NA BICARB-NACL 420 G PO SOLR: 4000.0000 mL | ORAL | 0 refills | Status: DC

## 2022-06-14 NOTE — Telephone Encounter (Signed)
Zophia Marrone Ann Acelyn Basham, CMA  ?

## 2022-06-18 ENCOUNTER — Ambulatory Visit (INDEPENDENT_AMBULATORY_CARE_PROVIDER_SITE_OTHER): Payer: Medicare Other | Admitting: *Deleted

## 2022-06-18 DIAGNOSIS — I480 Paroxysmal atrial fibrillation: Secondary | ICD-10-CM | POA: Diagnosis not present

## 2022-06-18 DIAGNOSIS — Z5181 Encounter for therapeutic drug level monitoring: Secondary | ICD-10-CM | POA: Diagnosis not present

## 2022-06-18 LAB — POCT INR: INR: 2.3 (ref 2.0–3.0)

## 2022-06-18 NOTE — Patient Instructions (Signed)
Continue warfarin 1 1/2 tablets daily except 1 tablet on Tuesdays and Fridays Pending colonoscopy on 8/16.  Will hold warfarin 5 days before procedure. Restart night of procedure is OK with MD Recheck INR 07/09/22.

## 2022-06-20 ENCOUNTER — Ambulatory Visit (INDEPENDENT_AMBULATORY_CARE_PROVIDER_SITE_OTHER): Payer: Medicare Other | Admitting: Neurology

## 2022-06-20 DIAGNOSIS — M62838 Other muscle spasm: Secondary | ICD-10-CM

## 2022-06-20 DIAGNOSIS — M542 Cervicalgia: Secondary | ICD-10-CM

## 2022-06-20 DIAGNOSIS — M7918 Myalgia, other site: Secondary | ICD-10-CM | POA: Diagnosis not present

## 2022-06-20 MED ORDER — ONABOTULINUMTOXINA 100 UNITS IJ SOLR
100.0000 [IU] | Freq: Once | INTRAMUSCULAR | Status: AC
  Administered 2022-06-20: 100 [IU] via INTRAMUSCULAR

## 2022-06-20 MED ORDER — DIAZEPAM 5 MG PO TABS: 5.0000 mg | ORAL_TABLET | Freq: Every day | ORAL | 0 refills | Status: DC | PRN

## 2022-06-20 NOTE — Progress Notes (Signed)
Botox- 100 units x 2 vials Lot: [id number] Expiration: 01/2024 NDC: 4010-2725-36  Bacteriostatic 0.9% Sodium Chloride- 62m total Lot: [id number]: 07/14/2023 NDC: [address]-87 Dx: MF64.33B/B

## 2022-06-20 NOTE — Progress Notes (Signed)
[facility]  PATIENT: Heather [name] DOB: [birth year]  REFERRING DOCTOR OR PCP:  Blanchie Serve SOURCE: patient  _________________________________   HISTORICAL  CHIEF COMPLAINT:  Chief Complaint  Patient presents with   Injections    Rm 2, here for botox would like to have a updated script for diazepam called in if he will be ok with that.     HISTORY OF PRESENT ILLNESS:  Heather [name] is a 76 y.o. woman who has had chronic right-sided mid back pain since 2013.      Update 06/19/2022 Her myofascial pain in the right trunk has done very well over the last 3 months.  She continues to get a benefit from the Botox injections.  She did not have severe pain at any time during the last 3 months.   When she does have more pain, she will take half of a Percocet.  If she notes more spasticity she will take half of Valium.  The last prescription has lasted more than 6 months.  She notes being fairly active and does gardening.  She notes no worsening difficulties with cognition.  A few visits ago she had noted some milder memory loss and language difficulty but this is stable.  Most nights she sleeps well but she has rare insomnia when pain is worse and will rarely take diazepam (usually just one or two a month).  She never combines with a percocet.   She also reports more fatigue.       She has no  new weakness.  She does have some back and leg pain more on the right.    Lumbar MRI in 2014 showed DJD at L3-L4 with spondylolisthesis and possible effects on L3 nerve roots.    She is on coumadin for atrial fibrillation.        IMAGING:  MRI results from 10/14/2013: The MRI of the brain showed age related atrophy and minimal chronic microvascular ischemic change. MRI of the cervical spine showed multilevel mild degenerative changes with left paramedian disc herniation at C3-C4 and left disc protrusion at C4-C5 and C5-C6 and midline disc herniation at C6-C7. There was no report  of nerve root compression. MRI of the thoracic spine showed disc desiccation but no herniation or protrusions. MRI of the lumbar spine showed disc bulges at T12-L1 and L2-L3 and disc bulge with facet hypertrophy at L3-L4 and disc bulge with right foraminal annular tear at L4-L5.  MRI brain 7/4/2-22 was personally reviewed.  It shows mild generalized atrophy.  No acute findings.     REVIEW OF SYSTEMS: Constitutional: No fevers, chills, sweats, or change in appetite Eyes: No visual changes, double vision, eye pain Ear, nose and throat: No hearing loss, ear pain, nasal congestion, sore throat Cardiovascular: No chest pain, palpitations.   She has atrial fibrillation and is on warfarin Respiratory:  No shortness of breath at rest or with exertion.   No wheezes GastrointestinaI: No nausea, vomiting, diarrhea, abdominal pain, fecal incontinence Genitourinary:  No dysuria, urinary retention or frequency.  No nocturia. Musculoskeletal:  No neck pain.  She has back pain/thoracic pain as above Integumentary: No rash, pruritus, skin lesions Neurological: as above Psychiatric: No depression at this time.  No anxiety Endocrine: No palpitations, diaphoresis, change in appetite, change in weigh or increased thirst Hematologic/Lymphatic:  No anemia, purpura, petechiae. Allergic/Immunologic: No itchy/runny eyes, nasal congestion, recent allergic reactions, rashes  ALLERGIES: Allergies  Allergen Reactions   Statins Other (See Comments)    Muscle weakness  Other     EKG pads causes blisters    Sulfa Antibiotics Rash    HOME MEDICATIONS:  Current Outpatient Medications:    Biotin 10000 MCG TABS, Take 10,000 mcg by mouth daily at 12 noon., Disp: , Rfl:    buPROPion (WELLBUTRIN SR) 150 MG 12 hr tablet, TAKE ONE TABLET BY MOUTH TWICE A DAY, Disp: 180 tablet, Rfl: 1   Cholecalciferol (VITAMIN D3) 125 MCG (5000 UT) TABS, Take 5,000 Units by mouth daily at 12 noon., Disp: , Rfl:    Coenzyme Q10 100 MG  capsule, Take 100 mg by mouth daily at 12 noon., Disp: , Rfl:    docusate sodium (COLACE) 100 MG capsule, Take 100 mg by mouth 2 (two) times daily. , Disp: , Rfl:    esomeprazole (NEXIUM) 40 MG capsule, TAKE 1 CAPSULE(40 MG) BY MOUTH DAILY BEFORE AND BREAKFAST, Disp: 30 capsule, Rfl: 2   estradiol (ESTRACE) 2 MG tablet, Take 1 tablet (2 mg total) by mouth daily. (Patient taking differently: Take 3 mg by mouth daily.), Disp: 90 tablet, Rfl: 1   famotidine (PEPCID) 20 MG tablet, One after supper, Disp: 30 tablet, Rfl: 11   nitrofurantoin, macrocrystal-monohydrate, (MACROBID) 100 MG capsule, Take 1 capsule (100 mg total) by mouth 2 (two) times daily. (Patient taking differently: Take 100 mg by mouth daily as needed (cystitis flare).), Disp: 60 capsule, Rfl: 0   nitroGLYCERIN (NITROSTAT) 0.4 MG SL tablet, Place 1 tablet under the tongue every 5 minutes as needed for chest pain., Disp: 25 tablet, Rfl: 1   oxyCODONE-acetaminophen (PERCOCET/ROXICET) 5-325 MG tablet, TAKE 1 TABLET DAILY AS NEEDED FOR SEVERE PAIN., Disp: 30 tablet, Rfl: 0   polyethylene glycol-electrolytes (TRILYTE) 420 g solution, Take 4,000 mLs by mouth as directed., Disp: 4000 mL, Rfl: 0   progesterone (PROMETRIUM) 200 MG capsule, Take 1 capsule (200 mg total) by mouth daily., Disp: 90 capsule, Rfl: 1   warfarin (COUMADIN) 5 MG tablet, Take one to one and one-half tablets by mouth daily or as directed., Disp: 135 tablet, Rfl: 1   diazepam (VALIUM) 5 MG tablet, Take 1 tablet (5 mg total) by mouth daily as needed for anxiety or muscle spasms., Disp: 30 tablet, Rfl: 0  PAST MEDICAL HISTORY: Past Medical History:  Diagnosis Date   Adrenal adenoma    Allergy    Anemia    Anxiety    CAD (coronary artery disease)    a. 50-60% mid LAD at cardiac catheterization 2014 [facility]) b. non-obstructive CAD by cath in 09/2018   Cataract    Depression    DJD (degenerative joint disease) of cervical spine 09/12/2016   H. pylori infection  07/19/2017   History of cardiomyopathy    History of COVID-19 12/10/2019   History of tobacco use    Hyperlipidemia    Mid back pain 02/08/2017   Neuromuscular disorder ([facility])    PAF (paroxysmal atrial fibrillation) ([place])    Diagnosis July 2017 - spontaneously resolved   Smell disturbance 06/13/2021   Statin intolerance     PAST SURGICAL HISTORY: Past Surgical History:  Procedure Laterality Date   ABDOMINAL HYSTERECTOMY  1980   endometriosis   BIOPSY  07/18/2021   Procedure: BIOPSY;  Surgeon: Harvel Quale, MD;  [facility];  Service: Gastroenterology;;   CATARACT EXTRACTION Left 11/2018   ESOPHAGOGASTRODUODENOSCOPY (EGD) WITH PROPOFOL N/A 07/18/2021   Procedure: ESOPHAGOGASTRODUODENOSCOPY (EGD) WITH PROPOFOL;  Surgeon: Harvel Quale, MD;  [facility];  Service: Gastroenterology;  Laterality:  N/A;  12:00   INTRAVASCULAR PRESSURE WIRE/FFR STUDY N/A 06/01/2021   Procedure: INTRAVASCULAR PRESSURE WIRE/FFR STUDY;  Surgeon: Belva Crome, MD;  Location: [place] [facility];  Service: Cardiovascular;  Laterality: N/A;   LEFT HEART CATH AND CORONARY ANGIOGRAPHY N/A 09/25/2018   Procedure: LEFT HEART CATH AND CORONARY ANGIOGRAPHY;  Surgeon: Belva Crome, MD;  Location: [place] [facility];  Service: Cardiovascular;  Laterality: N/A;   LEFT HEART CATH AND CORONARY ANGIOGRAPHY N/A 06/01/2021   Procedure: LEFT HEART CATH AND CORONARY ANGIOGRAPHY;  Surgeon: Belva Crome, MD;  Location: [place] [facility];  Service: Cardiovascular;  Laterality: N/A;   SAVORY DILATION  07/18/2021   Procedure: SAVORY DILATION;  Surgeon: Montez Morita, Quillian Quince, MD;  [facility];  Service: Gastroenterology;;   TONSILLECTOMY  1954    FAMILY HISTORY: Family History  Problem Relation Age of Onset   COPD Mother    Hearing loss Mother    Stroke Mother    Heart disease Mother        chf, died at 17   Arthritis Father    Hypertension Father    Heart disease  Father        cad, pvd, died at 64   Heart disease Sister        heart valve   Arthritis Sister    Hyperlipidemia Brother    Hypertension Brother    Cancer - Prostate Brother     SOCIAL HISTORY:  Social History   Socioeconomic History   Marital status: Married    Spouse name: Al   Number of children: 0   Years of education: 18   Highest education level: Not on file  Occupational History   Occupation: Probation officer    Comment: from home  Tobacco Use   Smoking status: Former    Packs/day: 0.00    Types: Cigarettes    Start date: 11/12/1965    Quit date: 09/06/2008    Years since quitting: 13.7   Smokeless tobacco: Never  Vaping Use   Vaping Use: Never used  Substance and Sexual Activity   Alcohol use: Yes    Alcohol/week: 1.0 standard drink of alcohol    Types: 1 Glasses of wine per week    Comment: occ   Drug use: Yes    Types: Marijuana    Comment: rarely   Sexual activity: Yes    Birth control/protection: Post-menopausal  Other Topics Concern   Not on file  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   Lives with husband AL   No children   Healthy diet and lifestyle   Social Determinants of Health   Financial Resource Strain: Low Risk  (09/13/2021)   Overall Financial Resource Strain (CARDIA)    Difficulty of Paying Living Expenses: Not hard at all  Food Insecurity: No Food Insecurity (09/13/2021)   Hunger Vital Sign    Worried About Running Out of Food in the Last Year: Never true    Ran Out of Food in the Last Year: Never true  Transportation Needs: No Transportation Needs (09/13/2021)   PRAPARE - Hydrologist (Medical): No    Lack of Transportation (Non-Medical): No  Physical Activity: Insufficiently Active (09/13/2021)   Exercise Vital Sign    Days of Exercise per Week: 3 days    Minutes of Exercise per Session: 30 min  Stress: No Stress Concern Present (09/13/2021)   [place]  Questionnaire    Feeling of Stress : Only a little  Social Connections: Moderately Isolated (09/13/2021)   Social Connection and Isolation Panel [NHANES]    Frequency of Communication with Friends and Family: More than three times a week    Frequency of Social Gatherings with Friends and Family: Twice a week    Attends Religious Services: Never    Marine scientist or Organizations: No    Attends Archivist Meetings: Never    Marital Status: Married  Human resources officer Violence: Not At Risk (09/13/2021)   Humiliation, Afraid, Rape, and Kick questionnaire    Fear of Current or Ex-Partner: No    Emotionally Abused: No    Physically Abused: No    Sexually Abused: No     PHYSICAL EXAM   General: The patient is well-developed and well-nourished and in no acute distress.  Head is normocephalic and atraumatic.   Musculoskeletal : There is tenderness in the mid to lower thoracic intercoastal muscles from T7-T9 on the right .  The neck was nontender today.  Neurologic Exam  Mental status: The patient is alert and oriented x 3 at the time of the examination.   Focus and attention seem normal.   Speech is normal.  Cranial nerves: Extraocular muscles are intact.  Facial strength is normal.   . No obvious hearing deficits are noted.  Motor: She has normal muscle tone, muscle bulk and muscle strength in the arms or legs.  gait and station: Gait and station are normal.  The Romberg is negative    ASSESSMENT AND PLAN  1. Myofascial pain syndrome   2. Neck pain   3. Muscle spasm        1.  100 units of Botox was split into tender points below the T6-T9 ribs into the intercostal muscles near the midclavicular line.  There were no complications and she tolerated the injections well.   2.   She should continue to stay active and exercise as tolerated.  Can take rare Percocet as needed for days when there is more intense pain.  If severe spasms take Valium 3.   She will return  in 3-4 months for next Botox or sooner if  new or worsening neurologic symptoms.   Manolito Jurewicz A. Felecia Shelling, MD, PhD 04/19/1274, 1:70 PM Certified in Neurology, [place] [facility]phys[facility], Sleep Medicine, Pain Medicine and Neuroimaging  [facility] [address] [place], [place] [address] [phone]

## 2022-06-21 ENCOUNTER — Telehealth (INDEPENDENT_AMBULATORY_CARE_PROVIDER_SITE_OTHER): Payer: Self-pay

## 2022-06-21 MED ORDER — PEG 3350-KCL-NA BICARB-NACL 420 G PO SOLR: 4000.0000 mL | ORAL | 0 refills | Status: DC

## 2022-06-21 NOTE — Telephone Encounter (Signed)
Heather [name], CMA  ?

## 2022-06-27 ENCOUNTER — Ambulatory Visit (HOSPITAL_COMMUNITY)
Admission: RE | Admit: 2022-06-27 | Discharge: 2022-06-27 | Disposition: A | Discharge status: Home / Self Care | Payer: Medicare Other | Attending: Gastroenterology | Admitting: Gastroenterology

## 2022-06-27 ENCOUNTER — Encounter (HOSPITAL_COMMUNITY)
Admission: RE | Disposition: A | Discharge status: Home / Self Care | Payer: Self-pay | Source: Home / Self Care | Attending: Gastroenterology

## 2022-06-27 ENCOUNTER — Ambulatory Visit (HOSPITAL_BASED_OUTPATIENT_CLINIC_OR_DEPARTMENT_OTHER): Payer: Medicare Other | Admitting: Anesthesiology

## 2022-06-27 ENCOUNTER — Ambulatory Visit (HOSPITAL_COMMUNITY): Payer: Medicare Other | Admitting: Anesthesiology

## 2022-06-27 ENCOUNTER — Other Ambulatory Visit: Payer: Self-pay

## 2022-06-27 ENCOUNTER — Encounter (HOSPITAL_COMMUNITY): Payer: Self-pay | Admitting: Gastroenterology

## 2022-06-27 DIAGNOSIS — Z7901 Long term (current) use of anticoagulants: Secondary | ICD-10-CM | POA: Diagnosis not present

## 2022-06-27 DIAGNOSIS — Z800 Family history of malignant neoplasm of digestive organs: Secondary | ICD-10-CM | POA: Diagnosis not present

## 2022-06-27 DIAGNOSIS — F32A Depression, unspecified: Secondary | ICD-10-CM | POA: Diagnosis not present

## 2022-06-27 DIAGNOSIS — Z8673 Personal history of transient ischemic attack (TIA), and cerebral infarction without residual deficits: Secondary | ICD-10-CM | POA: Diagnosis not present

## 2022-06-27 DIAGNOSIS — K5730 Diverticulosis of large intestine without perforation or abscess without bleeding: Secondary | ICD-10-CM | POA: Insufficient documentation

## 2022-06-27 DIAGNOSIS — Z09 Encounter for follow-up examination after completed treatment for conditions other than malignant neoplasm: Secondary | ICD-10-CM | POA: Diagnosis not present

## 2022-06-27 DIAGNOSIS — F419 Anxiety disorder, unspecified: Secondary | ICD-10-CM | POA: Insufficient documentation

## 2022-06-27 DIAGNOSIS — D122 Benign neoplasm of ascending colon: Secondary | ICD-10-CM | POA: Insufficient documentation

## 2022-06-27 DIAGNOSIS — I429 Cardiomyopathy, unspecified: Secondary | ICD-10-CM | POA: Diagnosis not present

## 2022-06-27 DIAGNOSIS — K635 Polyp of colon: Secondary | ICD-10-CM | POA: Diagnosis not present

## 2022-06-27 DIAGNOSIS — Z86010 Personal history of colonic polyps: Secondary | ICD-10-CM

## 2022-06-27 DIAGNOSIS — G709 Myoneural disorder, unspecified: Secondary | ICD-10-CM | POA: Insufficient documentation

## 2022-06-27 DIAGNOSIS — D124 Benign neoplasm of descending colon: Secondary | ICD-10-CM | POA: Insufficient documentation

## 2022-06-27 DIAGNOSIS — E785 Hyperlipidemia, unspecified: Secondary | ICD-10-CM | POA: Insufficient documentation

## 2022-06-27 DIAGNOSIS — D759 Disease of blood and blood-forming organs, unspecified: Secondary | ICD-10-CM | POA: Insufficient documentation

## 2022-06-27 DIAGNOSIS — I2510 Atherosclerotic heart disease of native coronary artery without angina pectoris: Secondary | ICD-10-CM | POA: Insufficient documentation

## 2022-06-27 DIAGNOSIS — R0600 Dyspnea, unspecified: Secondary | ICD-10-CM | POA: Diagnosis not present

## 2022-06-27 DIAGNOSIS — D649 Anemia, unspecified: Secondary | ICD-10-CM | POA: Diagnosis not present

## 2022-06-27 DIAGNOSIS — Z1211 Encounter for screening for malignant neoplasm of colon: Secondary | ICD-10-CM | POA: Diagnosis not present

## 2022-06-27 DIAGNOSIS — Z87891 Personal history of nicotine dependence: Secondary | ICD-10-CM | POA: Diagnosis not present

## 2022-06-27 DIAGNOSIS — M1990 Unspecified osteoarthritis, unspecified site: Secondary | ICD-10-CM | POA: Diagnosis not present

## 2022-06-27 DIAGNOSIS — D123 Benign neoplasm of transverse colon: Secondary | ICD-10-CM | POA: Diagnosis not present

## 2022-06-27 DIAGNOSIS — I480 Paroxysmal atrial fibrillation: Secondary | ICD-10-CM | POA: Insufficient documentation

## 2022-06-27 HISTORY — DX: Cardiac arrhythmia, unspecified: I49.9

## 2022-06-27 HISTORY — PX: SUBMUCOSAL LIFTING INJECTION: SHX6855

## 2022-06-27 HISTORY — PX: POLYPECTOMY: SHX5525

## 2022-06-27 HISTORY — PX: SUBMUCOSAL TATTOO INJECTION: SHX6856

## 2022-06-27 HISTORY — PX: HEMOSTASIS CLIP PLACEMENT: SHX6857

## 2022-06-27 HISTORY — PX: COLONOSCOPY WITH PROPOFOL: SHX5780

## 2022-06-27 LAB — HM COLONOSCOPY

## 2022-06-27 SURGERY — COLONOSCOPY WITH PROPOFOL
Anesthesia: General

## 2022-06-27 MED ORDER — SPOT INK MARKER SYRINGE KIT
PACK | SUBMUCOSAL | Status: DC | PRN
  Administered 2022-06-27: 1 mL via SUBMUCOSAL

## 2022-06-27 MED ORDER — PROPOFOL 500 MG/50ML IV EMUL
INTRAVENOUS | Status: AC
  Filled 2022-06-27: qty 50

## 2022-06-27 MED ORDER — PROPOFOL 500 MG/50ML IV EMUL
INTRAVENOUS | Status: DC | PRN
  Administered 2022-06-27: 150 ug/kg/min via INTRAVENOUS

## 2022-06-27 MED ORDER — PROPOFOL 10 MG/ML IV BOLUS
INTRAVENOUS | Status: DC | PRN
  Administered 2022-06-27: 60 mg via INTRAVENOUS

## 2022-06-27 MED ORDER — LACTATED RINGERS IV SOLN: INTRAVENOUS | Status: DC

## 2022-06-27 MED ORDER — SPOT INK MARKER SYRINGE KIT
PACK | SUBMUCOSAL | Status: AC
  Filled 2022-06-27: qty 5

## 2022-06-27 NOTE — Transfer of Care (Signed)
Immediate Anesthesia Transfer of Care Note  Patient: Heather [name]  Procedure(s) Performed: COLONOSCOPY WITH PROPOFOL POLYPECTOMY SUBMUCOSAL LIFTING INJECTION SUBMUCOSAL TATTOO INJECTION HEMOSTASIS CLIP PLACEMENT  Patient Location: PACU  Anesthesia Type:General  Level of Consciousness: awake, alert  and oriented  Airway & Oxygen Therapy: Patient Spontanous Breathing  Post-op Assessment: Report given to RN, Post -op Vital signs reviewed and stable, Patient moving all extremities X 4 and Patient able to stick tongue midline  Post vital signs: Reviewed  Last Vitals:  Vitals Value Taken Time  BP 129/61   Temp 97.6   Pulse 64   Resp 13   SpO2 100     Last Pain:  Vitals:   06/27/22 1128  TempSrc:   PainSc: 4       Patients Stated Pain Goal: 6 (40/45/91 3685)  Complications: No notable events documented.

## 2022-06-27 NOTE — Discharge Instructions (Signed)
You are being discharged to home.  Resume your previous diet.  We are waiting for your pathology results.  Your physician has recommended a repeat colonoscopy for surveillance based on pathology results.  Restart coumadin in 3 days.

## 2022-06-27 NOTE — Anesthesia Postprocedure Evaluation (Signed)
Anesthesia Post Note  Patient: Merissa Renwick  Procedure(s) Performed: COLONOSCOPY WITH PROPOFOL POLYPECTOMY SUBMUCOSAL LIFTING INJECTION SUBMUCOSAL TATTOO INJECTION HEMOSTASIS CLIP PLACEMENT  Patient location during evaluation: Phase II Anesthesia Type: General Level of consciousness: awake and alert and oriented Pain management: pain level controlled Vital Signs Assessment: post-procedure vital signs reviewed and stable Respiratory status: spontaneous breathing, nonlabored ventilation and respiratory function stable Cardiovascular status: blood pressure returned to baseline and stable Postop Assessment: no apparent nausea or vomiting Anesthetic complications: no   No notable events documented.   Last Vitals:  Vitals:   06/27/22 0937 06/27/22 1227  BP: (!) 125/59 129/61  Pulse: 60 60  Resp: 18 14  Temp: 36.5 C 36.4 C  SpO2: 100% 100%    Last Pain:  Vitals:   06/27/22 1227  TempSrc: Oral  PainSc: 5                  Tanaiya Kolarik C Anaelle Dunton

## 2022-06-27 NOTE — Op Note (Signed)
[facility] Patient Name: Heather [name] Procedure Date: 06/27/2022 11:25 AM MRN: [id number] Date of Birth: [birth year] Attending MD: Maylon Peppers ,  CSN: [id number] Age: 76 Admit Type: Outpatient Procedure:                Colonoscopy Indications:              Surveillance: Personal history of colonic polyps                            (unknown histology) on last colonoscopy 3 years ago Providers:                Maylon Peppers, Rosina Lowenstein, RN, Everardo Pacific Referring MD:              Medicines:                Monitored Anesthesia Care Complications:            No immediate complications. Estimated Blood Loss:     Estimated blood loss: none. Procedure:                Pre-Anesthesia Assessment:                           - Prior to the procedure, a History and Physical                            was performed, and patient medications, allergies                            and sensitivities were reviewed. The patient's                            tolerance of previous anesthesia was reviewed.                           - The risks and benefits of the procedure and the                            sedation options and risks were discussed with the                            patient. All questions were answered and informed                            consent was obtained.                           - ASA Grade Assessment: II - A patient with mild                            systemic disease.                           After obtaining informed consent, the colonoscope                            was passed under  direct vision. Throughout the                            procedure, the patient's blood pressure, pulse, and                            oxygen saturations were monitored continuously. The                            [id number]-HQ190L ([id number]) scope was introduced through                            the anus and advanced to the the cecum, identified                            by  appendiceal orifice and ileocecal valve. The                            colonoscopy was performed without difficulty. The                            patient tolerated the procedure well. The quality                            of the bowel preparation was good. Scope In: 11:31:53 AM Scope Out: 12:21:56 PM Scope Withdrawal Time: 0 hours 37 minutes 28 seconds  Total Procedure Duration: 0 hours 50 minutes 3 seconds  Findings:      The perianal and digital rectal examinations were normal.      Three sessile polyps were found in the transverse colon and ascending       colon. The polyps were 5 to 8 mm in size. These polyps were removed with       a cold snare. Resection and retrieval were complete.      A 10 mm polyp was found in the transverse colon. The polyp was flat and       had a possible central depression. It was unclear if this was indeed a       polyp or scarring from previous polypectomy. Area was successfully       injected with 3.4 mL Eleview for a lift polypectomy. Imaging was       performed using white light and narrow band imaging to visualize the       mucosa and demarcate the polyp site after injection for EMR purposes.       The polyp was removed with a cold snare in two different pieces.       Resection and retrieval were complete. To prevent bleeding after the       polypectomy, four hemostatic clips were successfully placed. There was       no bleeding at the end of the procedure. Area was tattooed with an       injection of 0.4 mL of Spot (carbon black) 2 cm distal and contralateral       to the polypectomy site.      A 5 mm polyp was found in the descending colon. The polyp was sessile.       The polyp was removed with  a cold snare. Resection and retrieval were       complete.      Scattered small and large-mouthed diverticula were found in the sigmoid       colon, descending colon and ascending colon.      The retroflexed view of the distal rectum and anal verge was  normal and       showed no anal or rectal abnormalities. Impression:               - Three 5 to 8 mm polyps in the transverse colon                            and in the ascending colon, removed with a cold                            snare. Resected and retrieved.                           - One 10 mm polyp in the transverse colon, removed                            with a cold snare. Resected and retrieved via EMR.                            Injected. Clips were placed.                           - One 5 mm polyp in the descending colon, removed                            with a cold snare. Resected and retrieved.                           - Diverticulosis in the sigmoid colon, in the                            descending colon and in the ascending colon.                           - The distal rectum and anal verge are normal on                            retroflexion view. Moderate Sedation:      Per Anesthesia Care Recommendation:           - Discharge patient to home (ambulatory).                           - Resume previous diet.                           - Await pathology results.                           - Repeat colonoscopy for surveillance based on  pathology results.                           - Restart coumadin in 3 days. Procedure Code(s):        --- Professional ---                           [phone], 59, Colonoscopy, flexible; with removal of                            tumor(s), polyp(s), or other lesion(s) by snare                            technique                           45381, Colonoscopy, flexible; with directed                            submucosal injection(s), any substance Diagnosis Code(s):        --- Professional ---                           K63.5, Polyp of colon                           Z86.010, Personal history of colonic polyps                           K57.30, Diverticulosis of large intestine without                             perforation or abscess without bleeding CPT copyright 2019 [employer]. All rights reserved. The codes documented in this report are preliminary and upon coder review may  be revised to meet current compliance requirements. Maylon Peppers, MD Maylon Peppers,  06/27/2022 12:30:16 PM This report has been signed electronically. Number of Addenda: 0

## 2022-06-27 NOTE — H&P (Signed)
Heather [name] is an 76 y.o. female.   Chief Complaint: history colon polyps HPI: 76 year old female with past medical history of coronary artery disease, anxiety, hyperlipidemia, cardiomyopathy, paroxysmal A-fib on Coumadin, who comes for history of colon polyps.  States her last colonoscopy was performed 3 years ago and was found to have polyps, no reports are available.  The patient denies having any complaints such as melena, hematochezia, abdominal pain or distention, change in her bowel movement consistency or frequency, no changes in weight recently.  Patient states that one of her nieces had colon cancer   Past Medical History:  Diagnosis Date   Adrenal adenoma    Allergy    Anemia    Anxiety    CAD (coronary artery disease)    a. 50-60% mid LAD at cardiac catheterization 2014 [facility]) b. non-obstructive CAD by cath in 09/2018   Cataract    Depression    DJD (degenerative joint disease) of cervical spine 09/12/2016   Dysrhythmia    H. pylori infection 07/19/2017   History of cardiomyopathy    History of COVID-19 12/10/2019   History of tobacco use    Hyperlipidemia    Mid back pain 02/08/2017   Neuromuscular disorder (HCC)    PAF (paroxysmal atrial fibrillation) ([place])    Diagnosis July 2017 - spontaneously resolved   Smell disturbance 06/13/2021   Statin intolerance     Past Surgical History:  Procedure Laterality Date   ABDOMINAL HYSTERECTOMY  1980   endometriosis   BIOPSY  07/18/2021   Procedure: BIOPSY;  Surgeon: Harvel Quale, MD;  [facility];  Service: Gastroenterology;;   CATARACT EXTRACTION Left 11/2018   ESOPHAGOGASTRODUODENOSCOPY (EGD) WITH PROPOFOL N/A 07/18/2021   Procedure: ESOPHAGOGASTRODUODENOSCOPY (EGD) WITH PROPOFOL;  Surgeon: Harvel Quale, MD;  Location: [facility];  Service: Gastroenterology;  Laterality: N/A;  12:00   INTRAVASCULAR PRESSURE WIRE/FFR STUDY N/A 06/01/2021   Procedure: INTRAVASCULAR PRESSURE  WIRE/FFR STUDY;  Surgeon: Belva Crome, MD;  Location: [place] [facility];  Service: Cardiovascular;  Laterality: N/A;   LEFT HEART CATH AND CORONARY ANGIOGRAPHY N/A 09/25/2018   Procedure: LEFT HEART CATH AND CORONARY ANGIOGRAPHY;  Surgeon: Belva Crome, MD;  Location: [place] [facility];  Service: Cardiovascular;  Laterality: N/A;   LEFT HEART CATH AND CORONARY ANGIOGRAPHY N/A 06/01/2021   Procedure: LEFT HEART CATH AND CORONARY ANGIOGRAPHY;  Surgeon: Belva Crome, MD;  Location: [place] [facility];  Service: Cardiovascular;  Laterality: N/A;   SAVORY DILATION  07/18/2021   Procedure: SAVORY DILATION;  Surgeon: Montez Morita, Quillian Quince, MD;  Location: [facility];  Service: Gastroenterology;;   TONSILLECTOMY  1954    Family History  Problem Relation Age of Onset   COPD Mother    Hearing loss Mother    Stroke Mother    Heart disease Mother        chf, died at 40   Arthritis Father    Hypertension Father    Heart disease Father        cad, pvd, died at 25   Heart disease Sister        heart valve   Arthritis Sister    Hyperlipidemia Brother    Hypertension Brother    Cancer - Prostate Brother    Social History:  reports that she quit smoking about 13 years ago. Her smoking use included cigarettes. She started smoking about 56 years ago. She has never used smokeless tobacco. She reports current alcohol use  of about 1.0 standard drink of alcohol per week. She reports current drug use. Drug: Marijuana.  Allergies:  Allergies  Allergen Reactions   Statins Other (See Comments)    Muscle weakness   Other     EKG pads causes blisters    Sulfa Antibiotics Rash    Medications Prior to Admission  Medication Sig Dispense Refill   Biotin 10000 MCG TABS Take 10,000 mcg by mouth daily at 12 noon.     buPROPion (WELLBUTRIN SR) 150 MG 12 hr tablet TAKE ONE TABLET BY MOUTH TWICE A DAY 180 tablet 1   Cholecalciferol (VITAMIN D3) 125 MCG (5000 UT) TABS Take 5,000 Units by mouth daily  at 12 noon.     Coenzyme Q10 100 MG capsule Take 100 mg by mouth daily at 12 noon.     diazepam (VALIUM) 5 MG tablet Take 1 tablet (5 mg total) by mouth daily as needed for anxiety or muscle spasms. 30 tablet 0   docusate sodium (COLACE) 100 MG capsule Take 100 mg by mouth 2 (two) times daily.      estradiol (ESTRACE) 2 MG tablet Take 1 tablet (2 mg total) by mouth daily. (Patient taking differently: Take 3 mg by mouth daily.) 90 tablet 1   nitrofurantoin, macrocrystal-monohydrate, (MACROBID) 100 MG capsule Take 1 capsule (100 mg total) by mouth 2 (two) times daily. (Patient taking differently: Take 100 mg by mouth daily as needed (cystitis flare).) 60 capsule 0   oxyCODONE-acetaminophen (PERCOCET/ROXICET) 5-325 MG tablet TAKE 1 TABLET DAILY AS NEEDED FOR SEVERE PAIN. 30 tablet 0   progesterone (PROMETRIUM) 200 MG capsule Take 1 capsule (200 mg total) by mouth daily. 90 capsule 1   warfarin (COUMADIN) 5 MG tablet Take one to one and one-half tablets by mouth daily or as directed. 135 tablet 1   esomeprazole (NEXIUM) 40 MG capsule TAKE 1 CAPSULE(40 MG) BY MOUTH DAILY BEFORE AND BREAKFAST 30 capsule 2   famotidine (PEPCID) 20 MG tablet One after supper 30 tablet 11   nitroGLYCERIN (NITROSTAT) 0.4 MG SL tablet Place 1 tablet under the tongue every 5 minutes as needed for chest pain. 25 tablet 1   polyethylene glycol-electrolytes (TRILYTE) 420 g solution Take 4,000 mLs by mouth as directed. 4000 mL 0    No results found for this or any previous visit (from the past 48 hour(s)). No results found.  Review of Systems  All other systems reviewed and are negative.   Blood pressure (!) 125/59, pulse 60, temperature 97.7 F (36.5 C), temperature source Oral, resp. rate 18, height '5\' 4"'$  (1.626 m), weight 71.7 kg, last menstrual period 07/08/1979, SpO2 100 %. Physical Exam  GENERAL: The patient is AO x3, in no acute distress. HEENT: Head is normocephalic and atraumatic. EOMI are intact. Mouth is well  hydrated and without lesions. NECK: Supple. No masses LUNGS: Clear to auscultation. No presence of rhonchi/wheezing/rales. Adequate chest expansion HEART: RRR, normal s1 and s2. ABDOMEN: Soft, nontender, no guarding, no peritoneal signs, and nondistended. BS +. No masses. EXTREMITIES: Without any cyanosis, clubbing, rash, lesions or edema. NEUROLOGIC: AOx3, no focal motor deficit. SKIN: no jaundice, no rashes  Assessment/Plan 76 year old female with past medical history of coronary artery disease, anxiety, hyperlipidemia, cardiomyopathy, paroxysmal A-fib on Coumadin, who comes for history of colon polyps.  We will proceed with colonoscopy.  Harvel Quale, MD 06/27/2022, 11:26 AM

## 2022-06-27 NOTE — Anesthesia Preprocedure Evaluation (Signed)
Anesthesia Evaluation  Patient identified by MRN, date of birth, ID band Patient awake    Reviewed: Allergy & Precautions, NPO status , Patient's Chart, lab work & pertinent test results  Airway Mallampati: II  TM Distance: >3 FB Neck ROM: Full    Dental  (+) Dental Advisory Given,  Crowns :   Pulmonary neg pulmonary ROS, former smoker,    Pulmonary exam normal breath sounds clear to auscultation       Cardiovascular Exercise Tolerance: Good + CAD and + DOE  Normal cardiovascular exam+ dysrhythmias Atrial Fibrillation  Rhythm:Regular Rate:Normal     Neuro/Psych PSYCHIATRIC DISORDERS Anxiety Depression TIA Neuromuscular disease    GI/Hepatic negative GI ROS, (+)     substance abuse  marijuana use,   Endo/Other  negative endocrine ROS  Renal/GU negative Renal ROS  negative genitourinary   Musculoskeletal  (+) Arthritis , Osteoarthritis,    Abdominal   Peds negative pediatric ROS (+)  Hematology  (+) Blood dyscrasia, anemia ,   Anesthesia Other Findings 06/01/21 cath: ? Left main widely patent ? LAD with 20% ostial narrowing and mid vessel 25 to 30% narrowing.  A diagonal arises from the mid vessel and supplies more territory than LAD which stops before the LV apex. ? Circumflex gives several small branches and is widely patent ? Right coronary is dominant and widely patent ? LVEDP 12 mmHg.  EF 55%. ? Invasive hemodynamic coronary assessment revealed: CFR 5.0 (normal greater than 2.0); FFR in LAD territory 0.92 (normal greater than 0.8); and IMR (index of microvascular resistance) 11 (normal less than 25).  RECOMMENDATIONS:  ? Suspect the abnormal perfusion on technetium imaging represents residual scar from episode of Takotsubo 10 years ago. ? Unable to identify any significant coronary flow abnormalities.  Have still not excluded the possibility of coronary spasm which was not tested for today. ? This is now  her third catheterization with essentially widely patent coronaries.  No microvascular dysfunction has been identified.  The stenoses in the LAD are not hemodynamically significant.  Attention should be turned to other explanations for the atypical dyspnea and arm and leg numbness that she complains of.    Reproductive/Obstetrics negative OB ROS                             Anesthesia Physical Anesthesia Plan  ASA: 3  Anesthesia Plan: General   Post-op Pain Management: Minimal or no pain anticipated   Induction: Intravenous  PONV Risk Score and Plan: Propofol infusion  Airway Management Planned: Nasal Cannula and Natural Airway  Additional Equipment:   Intra-op Plan:   Post-operative Plan:   Informed Consent: I have reviewed the patients History and Physical, chart, labs and discussed the procedure including the risks, benefits and alternatives for the proposed anesthesia with the patient or authorized representative who has indicated his/her understanding and acceptance.     Dental advisory given  Plan Discussed with: CRNA and Surgeon  Anesthesia Plan Comments:         Anesthesia Quick Evaluation

## 2022-06-28 ENCOUNTER — Encounter (INDEPENDENT_AMBULATORY_CARE_PROVIDER_SITE_OTHER): Payer: Self-pay | Admitting: *Deleted

## 2022-06-28 ENCOUNTER — Other Ambulatory Visit: Payer: Self-pay | Admitting: Neurology

## 2022-06-28 LAB — SURGICAL PATHOLOGY

## 2022-06-28 MED ORDER — OXYCODONE-ACETAMINOPHEN 5-325 MG PO TABS: ORAL_TABLET | ORAL | 0 refills | Status: DC

## 2022-06-28 NOTE — Telephone Encounter (Signed)
Last OV was on 06/20/22.  Next OV is scheduled for 09/17/22.  Last RX was written on 05/16/22 for 30 tabs.   [place] [facility] Database has been reviewed.

## 2022-06-29 ENCOUNTER — Telehealth (INDEPENDENT_AMBULATORY_CARE_PROVIDER_SITE_OTHER): Payer: Self-pay

## 2022-06-29 NOTE — Telephone Encounter (Signed)
Patient called today with several concerns. She reports to have had a TCS on 06/27/2022 with Dr. Jenetta Downer.  I have let her know of the pathology showed 4 colonic tubular adenoma, the larger polyp that was removed via endoscopic mucosal resection had only mild hyperplastic changes but no other abnormalities concerning for dysplasia, repeat colonoscopy in 3 years.  #1 : Patient has lost her glasses wanted to know if we have heard that anyone found them. I gave her the number to short stay center to call to see if they have found them.   # 2 : Patient wants to know where the blood was coming from prior to the TCS in her stools, as there was no mention in her report of Hemorrhoids, but was given a paper on Hemorrhoids at discharge.   # 3 : Patient wants to know about the clips that were placed are these supposed to fall out on their own if so with in how much time. As she says she was told not to have a MRI in the near future, but was told she will need an xray to make sure they have disappeared, if so who arranges this? ( How long would she have to avoid MRI/ what is the time frame she should avoid)?  # 4 : Also patient wanted to make sure you were aware that the anesthesia which she was says was propofol was extremely painful and she wanted this noted in her chart as it felt as though someone was crushing her hand.She says she has researched this and what she found out is that it is painful for women and people small veins, this could have been prevented with the use of lidocaine.   # 5: Patient wants to know if she has any restrictions? Can she work can she do exercises or sit ups ... etc?

## 2022-06-29 NOTE — Telephone Encounter (Signed)
All questions were addressed  1.She found her glasses at home 2.  Bleeding was likely coming from very small hemorrhoids or perianal tear 3.  X-ray showed be ordered by the radiologist if she is undergoing any MRI in the next 6 months 4.She will notify the anesthesiologist next time to receive lidocaine for procedure. 5.  No restrictions

## 2022-07-02 ENCOUNTER — Encounter (HOSPITAL_COMMUNITY): Payer: Self-pay | Admitting: Gastroenterology

## 2022-07-09 ENCOUNTER — Ambulatory Visit: Payer: Medicare Other | Attending: Cardiology | Admitting: *Deleted

## 2022-07-09 DIAGNOSIS — I480 Paroxysmal atrial fibrillation: Secondary | ICD-10-CM | POA: Insufficient documentation

## 2022-07-09 DIAGNOSIS — Z5181 Encounter for therapeutic drug level monitoring: Secondary | ICD-10-CM | POA: Insufficient documentation

## 2022-07-09 LAB — POCT INR: INR: 1.5 — AB (ref 2.0–3.0)

## 2022-07-09 NOTE — Patient Instructions (Signed)
Take warfarin 2 tablets today and tomorrow then resume 1 1/2 tablets daily except 1 tablet on Tuesdays and Fridays Recheck INR in 3 wks

## 2022-07-14 ENCOUNTER — Encounter: Payer: Self-pay | Admitting: Internal Medicine

## 2022-07-19 DIAGNOSIS — Z23 Encounter for immunization: Secondary | ICD-10-CM | POA: Diagnosis not present

## 2022-07-26 ENCOUNTER — Ambulatory Visit: Payer: Medicare Other | Attending: Cardiology | Admitting: *Deleted

## 2022-07-26 DIAGNOSIS — I480 Paroxysmal atrial fibrillation: Secondary | ICD-10-CM | POA: Diagnosis not present

## 2022-07-26 DIAGNOSIS — Z5181 Encounter for therapeutic drug level monitoring: Secondary | ICD-10-CM | POA: Insufficient documentation

## 2022-07-26 LAB — POCT INR: INR: 2.7 (ref 2.0–3.0)

## 2022-07-26 NOTE — Patient Instructions (Signed)
Continue warfarin 1 1/2 tablets daily except 1 tablet on Tuesdays and Fridays Recheck INR in 4 wks

## 2022-08-01 ENCOUNTER — Other Ambulatory Visit: Payer: Self-pay | Admitting: Cardiology

## 2022-08-01 DIAGNOSIS — I480 Paroxysmal atrial fibrillation: Secondary | ICD-10-CM

## 2022-08-01 NOTE — Telephone Encounter (Signed)
Prescription refill request received for warfarin Lov: 05/08/22 Heather [name])  Next INR check: 08/28/22  Warfarin tablet strength: '5mg'$   Appropriate dose and refill sent to requested pharmacy.

## 2022-08-09 ENCOUNTER — Encounter: Payer: Self-pay | Admitting: Internal Medicine

## 2022-08-09 ENCOUNTER — Ambulatory Visit (INDEPENDENT_AMBULATORY_CARE_PROVIDER_SITE_OTHER): Payer: Medicare Other | Admitting: Internal Medicine

## 2022-08-09 DIAGNOSIS — E782 Mixed hyperlipidemia: Secondary | ICD-10-CM

## 2022-08-09 DIAGNOSIS — U071 COVID-19: Secondary | ICD-10-CM | POA: Diagnosis not present

## 2022-08-09 DIAGNOSIS — Z7901 Long term (current) use of anticoagulants: Secondary | ICD-10-CM

## 2022-08-09 DIAGNOSIS — I480 Paroxysmal atrial fibrillation: Secondary | ICD-10-CM | POA: Diagnosis not present

## 2022-08-09 MED ORDER — NIRMATRELVIR/RITONAVIR (PAXLOVID)TABLET: 3.0000 | ORAL_TABLET | Freq: Two times a day (BID) | ORAL | 0 refills | Status: AC

## 2022-08-09 NOTE — Patient Instructions (Signed)
Please start taking Paxlovid as prescribed.  Please get fasting blood tests done before the next visit.

## 2022-08-09 NOTE — Progress Notes (Signed)
Virtual Visit via Telephone Note   This visit type was conducted due to national recommendations for restrictions regarding the COVID-19 Pandemic (e.g. social distancing) in an effort to limit this patient's exposure and mitigate transmission in our community.  Due to her co-morbid illnesses, this patient is at least at moderate risk for complications without adequate follow up.  This format is felt to be most appropriate for this patient at this time.  The patient did not have access to video technology/had technical difficulties with video requiring transitioning to audio format only (telephone).  All issues noted in this document were discussed and addressed.  No physical exam could be performed with this format.  Evaluation Performed:  Follow-up visit  Date:  08/09/2022   ID:  Heather [name], DOB 03/10/1946, MRN [id number]  Patient Location: Home Provider Location: Office/Clinic  Participants: Patient Location of Patient: Home Location of Provider: Telehealth Consent was obtain for visit to be over via telehealth. I verified that I am speaking with the correct person using two identifiers.  PCP:  Lindell Spar, MD   Chief Complaint: Cough, fatigue and back pain  History of Present Illness:    Heather [name] is a 76 y.o. female who has a televisit for c/o cough, fatigue and back pain since yesterday.  She tested positive for COVID at home.  She denies any dyspnea or wheezing currently.  She also reports history of COVID and required monoclonal antibody in the past (Paxlovid was not available at that time).  The patient does have symptoms concerning for COVID-19 infection (fever, chills, cough, or new shortness of breath).   Past Medical, Surgical, Social History, Allergies, and Medications have been Reviewed.  Past Medical History:  Diagnosis Date   Adrenal adenoma    Allergy    Anemia    Anxiety    CAD (coronary artery disease)    a. 50-60% mid LAD at cardiac  catheterization 2014 [facility]) b. non-obstructive CAD by cath in 09/2018   Cataract    Depression    DJD (degenerative joint disease) of cervical spine 09/12/2016   Dysrhythmia    H. pylori infection 07/19/2017   History of cardiomyopathy    History of COVID-19 12/10/2019   History of tobacco use    Hyperlipidemia    Mid back pain 02/08/2017   Neuromuscular disorder (HCC)    PAF (paroxysmal atrial fibrillation) ([place])    Diagnosis July 2017 - spontaneously resolved   Smell disturbance 06/13/2021   Statin intolerance    Past Surgical History:  Procedure Laterality Date   ABDOMINAL HYSTERECTOMY  1980   endometriosis   BIOPSY  07/18/2021   Procedure: BIOPSY;  Surgeon: Harvel Quale, MD;  [facility];  Service: Gastroenterology;;   CATARACT EXTRACTION Left 11/2018   COLONOSCOPY WITH PROPOFOL N/A 06/27/2022   Procedure: COLONOSCOPY WITH PROPOFOL;  Surgeon: Harvel Quale, MD;  [facility];  Service: Gastroenterology;  Laterality: N/A;  11:15   ESOPHAGOGASTRODUODENOSCOPY (EGD) WITH PROPOFOL N/A 07/18/2021   Procedure: ESOPHAGOGASTRODUODENOSCOPY (EGD) WITH PROPOFOL;  Surgeon: Harvel Quale, MD;  [facility];  Service: Gastroenterology;  Laterality: N/A;  12:00   HEMOSTASIS CLIP PLACEMENT  06/27/2022   Procedure: HEMOSTASIS CLIP PLACEMENT;  Surgeon: Harvel Quale, MD;  [facility];  Service: Gastroenterology;;   INTRAVASCULAR PRESSURE WIRE/FFR STUDY N/A 06/01/2021   Procedure: INTRAVASCULAR PRESSURE WIRE/FFR STUDY;  Surgeon: Belva Crome, MD;  Location: [place] [facility];  Service:  Cardiovascular;  Laterality: N/A;   LEFT HEART CATH AND CORONARY ANGIOGRAPHY N/A 09/25/2018   Procedure: LEFT HEART CATH AND CORONARY ANGIOGRAPHY;  Surgeon: Belva Crome, MD;  Location: [place] [facility];  Service: Cardiovascular;  Laterality: N/A;   LEFT HEART CATH AND CORONARY ANGIOGRAPHY N/A 06/01/2021    Procedure: LEFT HEART CATH AND CORONARY ANGIOGRAPHY;  Surgeon: Belva Crome, MD;  Location: [place] [facility];  Service: Cardiovascular;  Laterality: N/A;   POLYPECTOMY  06/27/2022   Procedure: POLYPECTOMY;  Surgeon: Harvel Quale, MD;  [facility];  Service: Gastroenterology;;   Azzie Almas DILATION  07/18/2021   Procedure: Azzie Almas DILATION;  Surgeon: Montez Morita, Quillian Quince, MD;  [facility];  Service: Gastroenterology;;   Lia Foyer LIFTING INJECTION  06/27/2022   Procedure: SUBMUCOSAL LIFTING INJECTION;  Surgeon: Harvel Quale, MD;  [facility];  Service: Gastroenterology;;   SUBMUCOSAL TATTOO INJECTION  06/27/2022   Procedure: SUBMUCOSAL TATTOO INJECTION;  Surgeon: Montez Morita, Quillian Quince, MD;  [facility];  Service: Gastroenterology;;   TONSILLECTOMY  1954     Current Meds  Medication Sig   Biotin 10000 MCG TABS Take 10,000 mcg by mouth daily at 12 noon.   buPROPion (WELLBUTRIN SR) 150 MG 12 hr tablet TAKE ONE TABLET BY MOUTH TWICE A DAY   Cholecalciferol (VITAMIN D3) 125 MCG (5000 UT) TABS Take 5,000 Units by mouth daily at 12 noon.   Coenzyme Q10 100 MG capsule Take 100 mg by mouth daily at 12 noon.   diazepam (VALIUM) 5 MG tablet Take 1 tablet (5 mg total) by mouth daily as needed for anxiety or muscle spasms.   docusate sodium (COLACE) 100 MG capsule Take 100 mg by mouth 2 (two) times daily.    esomeprazole (NEXIUM) 40 MG capsule TAKE 1 CAPSULE(40 MG) BY MOUTH DAILY BEFORE AND BREAKFAST   estradiol (ESTRACE) 2 MG tablet Take 1 tablet (2 mg total) by mouth daily. (Patient taking differently: Take 3 mg by mouth daily.)   famotidine (PEPCID) 20 MG tablet One after supper   nitrofurantoin, macrocrystal-monohydrate, (MACROBID) 100 MG capsule Take 1 capsule (100 mg total) by mouth 2 (two) times daily. (Patient taking differently: Take 100 mg by mouth daily as needed (cystitis flare).)   nitroGLYCERIN (NITROSTAT) 0.4 MG SL  tablet Place 1 tablet under the tongue every 5 minutes as needed for chest pain.   oxyCODONE-acetaminophen (PERCOCET/ROXICET) 5-325 MG tablet TAKE 1 TABLET DAILY AS NEEDED FOR SEVERE PAIN.   progesterone (PROMETRIUM) 200 MG capsule Take 1 capsule (200 mg total) by mouth daily.   warfarin (COUMADIN) 5 MG tablet Take one to one and one-half tablets by mouth daily or as directed.     Allergies:   Statins, Other, and Sulfa antibiotics   ROS:   Please see the history of present illness.     All other systems reviewed and are negative.   Labs/Other Tests and Data Reviewed:    Recent Labs: No results found for requested labs within last 365 days.   Recent Lipid Panel Lab Results  Component Value Date/Time   CHOL 255 (H) 04/05/2021 02:14 PM   TRIG 152 (H) 04/05/2021 02:14 PM   HDL 56 04/05/2021 02:14 PM   CHOLHDL 4.6 04/05/2021 02:14 PM   LDLCALC 169 (H) 04/05/2021 02:14 PM    Wt Readings from Last 3 Encounters:  06/27/22 158 lb (71.7 kg)  05/03/22 155 lb (70.3 kg)  03/27/22 155 lb (70.3 kg)  ASSESSMENT & PLAN:    COVID-19 infection Started Paxlovid Tylenol as needed for fever or myalgias Mucinex or Robitussin as needed for cough  PAF, chronic anticoagulation On Coumadin Needs to get CBC and BMP done Advised to get INR checked as scheduled  Time:   Today, I have spent 11 minutes reviewing the chart, including problem list, medications, and with the patient with telehealth technology discussing the above problems.   Medication Adjustments/Labs and Tests Ordered: Current medicines are reviewed at length with the patient today.  Concerns regarding medicines are outlined above.   Tests Ordered: No orders of the defined types were placed in this encounter.   Medication Changes: No orders of the defined types were placed in this encounter.    Note: This dictation was prepared with Dragon dictation along with smaller phrase technology. Similar sounding words can be  transcribed inadequately or may not be corrected upon review. Any transcriptional errors that result from this process are unintentional.      Disposition:  Follow up  Signed, Lindell Spar, MD  08/09/2022 12:08 PM     [place]

## 2022-08-20 ENCOUNTER — Other Ambulatory Visit: Payer: Self-pay | Admitting: Neurology

## 2022-08-20 ENCOUNTER — Other Ambulatory Visit: Payer: Self-pay

## 2022-08-20 MED ORDER — OXYCODONE-ACETAMINOPHEN 5-325 MG PO TABS: ORAL_TABLET | ORAL | 0 refills | Status: DC

## 2022-08-20 NOTE — Telephone Encounter (Signed)
Pt has an appt 09/17/22 and has been checked on the registry.

## 2022-08-20 NOTE — Telephone Encounter (Signed)
Patient of Dr Rexene Alberts' ,  WID refill for one fill, no refills.

## 2022-08-22 ENCOUNTER — Encounter: Payer: Self-pay | Admitting: Internal Medicine

## 2022-08-22 ENCOUNTER — Telehealth (INDEPENDENT_AMBULATORY_CARE_PROVIDER_SITE_OTHER): Payer: Medicare Other | Admitting: Internal Medicine

## 2022-08-22 DIAGNOSIS — Z7901 Long term (current) use of anticoagulants: Secondary | ICD-10-CM | POA: Diagnosis not present

## 2022-08-22 DIAGNOSIS — I480 Paroxysmal atrial fibrillation: Secondary | ICD-10-CM

## 2022-08-22 DIAGNOSIS — U099 Post covid-19 condition, unspecified: Secondary | ICD-10-CM | POA: Diagnosis not present

## 2022-08-22 NOTE — Progress Notes (Signed)
Virtual Visit via Video Note   Because of Heather [name]'s co-morbid illnesses, she is at least at moderate risk for complications without adequate follow up.  This format is felt to be most appropriate for this patient at this time.  All issues noted in this document were discussed and addressed.  A limited physical exam was performed with this format.      Evaluation Performed:  Follow-up visit  Date:  08/22/2022   ID:  Heather [name], DOB [birth year], MRN [id number]  Patient Location: Home Provider Location: Office/Clinic  Participants: Patient  Location of Patient: Home Location of Provider: Telehealth Consent was obtain for visit to be over via telehealth. I verified that I am speaking with the correct person using two identifiers.  PCP:  Lindell Spar, MD   Chief Complaint: Fatigue, palpitations, dizziness  History of Present Illness:    Heather [name] is a 76 y.o. female who has a televisit for complaint of fatigue, palpitations and dizziness for the last 2 weeks.  She was recently treated for COVID with Paxlovid.  Her cough has resolved now, but she still has fatigue and dizziness.  Denies any nausea, vomiting or diarrhea currently.  Of note, she has history of atrial fibrillation, not on any rate controlling agent.  She is on Coumadin for [facility].  She has not had CBC recently.  Denies any melena or hematochezia currently.  The patient does not have symptoms concerning for COVID-19 infection (fever, chills, cough, or new shortness of breath).   Past Medical, Surgical, Social History, Allergies, and Medications have been Reviewed.  Past Medical History:  Diagnosis Date   Adrenal adenoma    Allergy    Anemia    Anxiety    CAD (coronary artery disease)    a. 50-60% mid LAD at cardiac catheterization 2014 [facility]) b. non-obstructive CAD by cath in 09/2018   Cataract    Depression    DJD (degenerative joint disease) of cervical spine 09/12/2016   Dysrhythmia    H.  pylori infection 07/19/2017   History of cardiomyopathy    History of COVID-19 12/10/2019   History of tobacco use    Hyperlipidemia    Mid back pain 02/08/2017   Neuromuscular disorder (HCC)    PAF (paroxysmal atrial fibrillation) ([place])    Diagnosis July 2017 - spontaneously resolved   Smell disturbance 06/13/2021   Statin intolerance    Past Surgical History:  Procedure Laterality Date   ABDOMINAL HYSTERECTOMY  1980   endometriosis   BIOPSY  07/18/2021   Procedure: BIOPSY;  Surgeon: Harvel Quale, MD;  [facility];  Service: Gastroenterology;;   CATARACT EXTRACTION Left 11/2018   COLONOSCOPY WITH PROPOFOL N/A 06/27/2022   Procedure: COLONOSCOPY WITH PROPOFOL;  Surgeon: Harvel Quale, MD;  [facility];  Service: Gastroenterology;  Laterality: N/A;  11:15   ESOPHAGOGASTRODUODENOSCOPY (EGD) WITH PROPOFOL N/A 07/18/2021   Procedure: ESOPHAGOGASTRODUODENOSCOPY (EGD) WITH PROPOFOL;  Surgeon: Harvel Quale, MD;  [facility];  Service: Gastroenterology;  Laterality: N/A;  12:00   HEMOSTASIS CLIP PLACEMENT  06/27/2022   Procedure: HEMOSTASIS CLIP PLACEMENT;  Surgeon: Harvel Quale, MD;  [facility];  Service: Gastroenterology;;   INTRAVASCULAR PRESSURE WIRE/FFR STUDY N/A 06/01/2021   Procedure: INTRAVASCULAR PRESSURE WIRE/FFR STUDY;  Surgeon: Belva Crome, MD;  Location: [place] [facility];  Service: Cardiovascular;  Laterality: N/A;   LEFT HEART CATH AND CORONARY ANGIOGRAPHY N/A 09/25/2018   Procedure: LEFT  HEART CATH AND CORONARY ANGIOGRAPHY;  Surgeon: Belva Crome, MD;  Location: [place] [facility];  Service: Cardiovascular;  Laterality: N/A;   LEFT HEART CATH AND CORONARY ANGIOGRAPHY N/A 06/01/2021   Procedure: LEFT HEART CATH AND CORONARY ANGIOGRAPHY;  Surgeon: Belva Crome, MD;  Location: [place] [facility];  Service: Cardiovascular;  Laterality: N/A;   POLYPECTOMY  06/27/2022   Procedure:  POLYPECTOMY;  Surgeon: Harvel Quale, MD;  [facility];  Service: Gastroenterology;;   Azzie Almas DILATION  07/18/2021   Procedure: Azzie Almas DILATION;  Surgeon: Montez Morita, Quillian Quince, MD;  [facility];  Service: Gastroenterology;;   Lia Foyer LIFTING INJECTION  06/27/2022   Procedure: SUBMUCOSAL LIFTING INJECTION;  Surgeon: Harvel Quale, MD;  [facility];  Service: Gastroenterology;;   SUBMUCOSAL TATTOO INJECTION  06/27/2022   Procedure: SUBMUCOSAL TATTOO INJECTION;  Surgeon: Montez Morita, Quillian Quince, MD;  [facility];  Service: Gastroenterology;;   TONSILLECTOMY  1954     Current Meds  Medication Sig   Biotin 10000 MCG TABS Take 10,000 mcg by mouth daily at 12 noon.   buPROPion (WELLBUTRIN SR) 150 MG 12 hr tablet TAKE ONE TABLET BY MOUTH TWICE A DAY   Cholecalciferol (VITAMIN D3) 125 MCG (5000 UT) TABS Take 5,000 Units by mouth daily at 12 noon.   Coenzyme Q10 100 MG capsule Take 100 mg by mouth daily at 12 noon.   diazepam (VALIUM) 5 MG tablet Take 1 tablet (5 mg total) by mouth daily as needed for anxiety or muscle spasms.   docusate sodium (COLACE) 100 MG capsule Take 100 mg by mouth 2 (two) times daily.    esomeprazole (NEXIUM) 40 MG capsule TAKE 1 CAPSULE(40 MG) BY MOUTH DAILY BEFORE AND BREAKFAST   estradiol (ESTRACE) 2 MG tablet Take 1 tablet (2 mg total) by mouth daily. (Patient taking differently: Take 3 mg by mouth daily.)   famotidine (PEPCID) 20 MG tablet One after supper   nitroGLYCERIN (NITROSTAT) 0.4 MG SL tablet Place 1 tablet under the tongue every 5 minutes as needed for chest pain.   oxyCODONE-acetaminophen (PERCOCET/ROXICET) 5-325 MG tablet TAKE 1 TABLET DAILY AS NEEDED FOR SEVERE PAIN.   progesterone (PROMETRIUM) 200 MG capsule Take 1 capsule (200 mg total) by mouth daily.   warfarin (COUMADIN) 5 MG tablet Take one to one and one-half tablets by mouth daily or as directed.     Allergies:   Statins,  Other, and Sulfa antibiotics   ROS:   Please see the history of present illness.     All other systems reviewed and are negative.   Labs/Other Tests and Data Reviewed:    Recent Labs: No results found for requested labs within last 365 days.   Recent Lipid Panel Lab Results  Component Value Date/Time   CHOL 255 (H) 04/05/2021 02:14 PM   TRIG 152 (H) 04/05/2021 02:14 PM   HDL 56 04/05/2021 02:14 PM   CHOLHDL 4.6 04/05/2021 02:14 PM   LDLCALC 169 (H) 04/05/2021 02:14 PM    Wt Readings from Last 3 Encounters:  06/27/22 158 lb (71.7 kg)  05/03/22 155 lb (70.3 kg)  03/27/22 155 lb (70.3 kg)     ASSESSMENT & PLAN:    PAF (paroxysmal atrial fibrillation) (HCC) On Coumadin Not on any rate controlling agents Needs to follow up with cardiology, may need beta-blocker for rate control now Check CBC as she is on Coumadin  Post-COVID syndrome Has persistent fatigue, chest tightness and dizziness since  COVID infection 2 weeks ago Advised to maintain adequate hydration Needs to get CBC, CMP and TSH done for chronic fatigue    Time:   Today, I have spent 12 minutes reviewing the chart, including problem list, medications, and with the patient with telehealth technology discussing the above problems.   Medication Adjustments/Labs and Tests Ordered: Current medicines are reviewed at length with the patient today.  Concerns regarding medicines are outlined above.   Tests Ordered: No orders of the defined types were placed in this encounter.   Medication Changes: No orders of the defined types were placed in this encounter.    Note: This dictation was prepared with Dragon dictation along with smaller phrase technology. Similar sounding words can be transcribed inadequately or may not be corrected upon review. Any transcriptional errors that result from this process are unintentional.      Disposition:  Follow up  Signed, Lindell Spar, MD  08/22/2022 9:39 AM      [facility]

## 2022-08-22 NOTE — Patient Instructions (Signed)
Please get fasting blood tests done in a week.  Please maintain adequate hydration by taking at least 64 ounces of fluid in a day.  Avoid skipping any meals.

## 2022-08-22 NOTE — Assessment & Plan Note (Addendum)
Has persistent fatigue, chest tightness and dizziness since COVID infection 2 weeks ago Recommended steroid taper, but she prefers to wait for now Advised to maintain adequate hydration Needs to get CBC, CMP and TSH done for chronic fatigue

## 2022-08-22 NOTE — Assessment & Plan Note (Signed)
On Coumadin Not on any rate controlling agents Needs to follow up with cardiology, may need beta-blocker for rate control now Check CBC as she is on Coumadin

## 2022-08-23 ENCOUNTER — Telehealth: Payer: Medicare Other | Admitting: Internal Medicine

## 2022-08-23 DIAGNOSIS — I480 Paroxysmal atrial fibrillation: Secondary | ICD-10-CM | POA: Diagnosis not present

## 2022-08-23 DIAGNOSIS — E782 Mixed hyperlipidemia: Secondary | ICD-10-CM | POA: Diagnosis not present

## 2022-08-23 DIAGNOSIS — Z7901 Long term (current) use of anticoagulants: Secondary | ICD-10-CM | POA: Diagnosis not present

## 2022-08-24 ENCOUNTER — Encounter: Payer: Self-pay | Admitting: Internal Medicine

## 2022-08-24 ENCOUNTER — Telehealth: Payer: Self-pay | Admitting: Internal Medicine

## 2022-08-24 ENCOUNTER — Ambulatory Visit (INDEPENDENT_AMBULATORY_CARE_PROVIDER_SITE_OTHER): Payer: Medicare Other | Admitting: Internal Medicine

## 2022-08-24 DIAGNOSIS — I480 Paroxysmal atrial fibrillation: Secondary | ICD-10-CM

## 2022-08-24 DIAGNOSIS — E039 Hypothyroidism, unspecified: Secondary | ICD-10-CM

## 2022-08-24 DIAGNOSIS — Z7901 Long term (current) use of anticoagulants: Secondary | ICD-10-CM | POA: Diagnosis not present

## 2022-08-24 DIAGNOSIS — E7801 Familial hypercholesterolemia: Secondary | ICD-10-CM

## 2022-08-24 DIAGNOSIS — E78019 Familial hypercholesterolemia, unspecified: Secondary | ICD-10-CM

## 2022-08-24 HISTORY — DX: Hypothyroidism, unspecified: E03.9

## 2022-08-24 LAB — LIPID PANEL
Chol/HDL Ratio: 6.9 ratio — ABNORMAL HIGH (ref 0.0–4.4)
Cholesterol, Total: 366 mg/dL — ABNORMAL HIGH (ref 100–199)
HDL: 53 mg/dL (ref >39)
LDL Chol Calc (NIH): 260 mg/dL — ABNORMAL HIGH (ref 0–99)
Triglycerides: 249 mg/dL — ABNORMAL HIGH (ref 0–149)
VLDL Cholesterol Cal: 53 mg/dL — ABNORMAL HIGH (ref 5–40)

## 2022-08-24 LAB — CBC WITH DIFFERENTIAL/PLATELET
Basophils Absolute: 0.1 10*3/uL (ref 0.0–0.2)
Basos: 1 %
EOS (ABSOLUTE): 0.1 10*3/uL (ref 0.0–0.4)
Eos: 1 %
Hematocrit: 35.6 % (ref 34.0–46.6)
Hemoglobin: 11.8 g/dL (ref 11.1–15.9)
Immature Grans (Abs): 0 10*3/uL (ref 0.0–0.1)
Immature Granulocytes: 0 %
Lymphocytes Absolute: 2.2 10*3/uL (ref 0.7–3.1)
Lymphs: 37 %
MCH: 29 pg (ref 26.6–33.0)
MCHC: 33.1 g/dL (ref 31.5–35.7)
MCV: 88 fL (ref 79–97)
Monocytes Absolute: 0.4 10*3/uL (ref 0.1–0.9)
Monocytes: 7 %
Neutrophils Absolute: 3.1 10*3/uL (ref 1.4–7.0)
Neutrophils: 54 %
Platelets: 230 10*3/uL (ref 150–450)
RBC: 4.07 x10E6/uL (ref 3.77–5.28)
RDW: 12.9 % (ref 11.7–15.4)
WBC: 5.8 10*3/uL (ref 3.4–10.8)

## 2022-08-24 LAB — BASIC METABOLIC PANEL
BUN/Creatinine Ratio: 29 — ABNORMAL HIGH (ref 12–28)
BUN: 24 mg/dL (ref 8–27)
CO2: 22 mmol/L (ref 20–29)
Calcium: 9.7 mg/dL (ref 8.7–10.3)
Chloride: 104 mmol/L (ref 96–106)
Creatinine, Ser: 0.82 mg/dL (ref 0.57–1.00)
Glucose: 96 mg/dL (ref 70–99)
Potassium: 4.8 mmol/L (ref 3.5–5.2)
Sodium: 142 mmol/L (ref 134–144)
eGFR: 74 mL/min/{1.73_m2} (ref >59)

## 2022-08-24 LAB — TSH+FREE T4
Free T4: 0.86 ng/dL (ref 0.82–1.77)
TSH: 8.27 u[IU]/mL — ABNORMAL HIGH (ref 0.450–4.500)

## 2022-08-24 MED ORDER — THYROID 30 MG PO TABS: 30.0000 mg | ORAL_TABLET | Freq: Every day | ORAL | 0 refills | Status: DC

## 2022-08-24 NOTE — Patient Instructions (Addendum)
You are being referred to lipid clinic.  Please try to find the [employer] patient assistance program.  [email or web address]  [email or web address]  Please start taking NP thyroid as prescribed.  Please get blood tests done before the next visit.

## 2022-08-24 NOTE — Telephone Encounter (Signed)
Pt returning call

## 2022-08-24 NOTE — Assessment & Plan Note (Signed)
Did not tolerate statin Had been taking NP thyroid Cholesterol profile concerning for familial hypercholesterolemia Referred to lipid clinic She does not have prescription coverage currently, patient assistance program details for [employer] provided through [place]

## 2022-08-24 NOTE — Progress Notes (Signed)
Virtual Visit via Telephone Note   This visit type was conducted via telephone. This format is felt to be most appropriate for this patient at this time.  The patient did not have access to video technology/had technical difficulties with video requiring transitioning to audio format only (telephone).  All issues noted in this document were discussed and addressed.  No physical exam could be performed with this format.  Evaluation Performed:  Follow-up visit  Date:  08/24/2022   ID:  Heather [name], DOB 30-May-1946, MRN [id number]  Patient Location: Home Provider Location: Office/Clinic  Participants: Patient Location of Patient: Home Location of Provider: Telehealth Consent was obtain for visit to be over via telehealth. I verified that I am speaking with the correct person using two identifiers.  PCP:  Lindell Spar, MD   Chief Complaint: Fatigue and follow-up of blood tests  History of Present Illness:    Heather [name] is a 76 y.o. female with PMH of CAD, PAF, HLD, myofascial pain syndrome and depression with anxiety who has a televisit for complaint of chronic fatigue and follow up of recent blood tests.  Her TSH was elevated with normal free T4.  She was taking NP thyroid in the past, which was actually started for HLD.  She had to stop taking it due to oversupplemented thyroid levels.  She complains of chronic fatigue, which is also likely due to post-COVID syndrome.  She has also noticed weight gain recently.  Her cholesterol panel showed LDL of 260.  Chart review suggests that she has had very high LDL and TG levels in the past as well.  She did not tolerate 3 different statins in the past.  The patient does not have symptoms concerning for COVID-19 infection (fever, chills, cough, or new shortness of breath).   Past Medical, Surgical, Social History, Allergies, and Medications have been Reviewed.  Past Medical History:  Diagnosis Date   Adrenal adenoma    Allergy     Anemia    Anxiety    CAD (coronary artery disease)    a. 50-60% mid LAD at cardiac catheterization 2014 [facility]) b. non-obstructive CAD by cath in 09/2018   Cataract    Depression    DJD (degenerative joint disease) of cervical spine 09/12/2016   Dysrhythmia    H. pylori infection 07/19/2017   History of cardiomyopathy    History of COVID-19 12/10/2019   History of tobacco use    Hyperlipidemia    Hypothyroidism 08/24/2022   Mid back pain 02/08/2017   Neuromuscular disorder (HCC)    PAF (paroxysmal atrial fibrillation) ([place])    Diagnosis July 2017 - spontaneously resolved   Smell disturbance 06/13/2021   Statin intolerance    Past Surgical History:  Procedure Laterality Date   ABDOMINAL HYSTERECTOMY  1980   endometriosis   BIOPSY  07/18/2021   Procedure: BIOPSY;  Surgeon: Harvel Quale, MD;  [facility];  Service: Gastroenterology;;   CATARACT EXTRACTION Left 11/2018   COLONOSCOPY WITH PROPOFOL N/A 06/27/2022   Procedure: COLONOSCOPY WITH PROPOFOL;  Surgeon: Harvel Quale, MD;  [facility];  Service: Gastroenterology;  Laterality: N/A;  11:15   ESOPHAGOGASTRODUODENOSCOPY (EGD) WITH PROPOFOL N/A 07/18/2021   Procedure: ESOPHAGOGASTRODUODENOSCOPY (EGD) WITH PROPOFOL;  Surgeon: Harvel Quale, MD;  [facility];  Service: Gastroenterology;  Laterality: N/A;  12:00   HEMOSTASIS CLIP PLACEMENT  06/27/2022   Procedure: HEMOSTASIS CLIP PLACEMENT;  Surgeon: Harvel Quale, MD;  [facility];  Service: Gastroenterology;;   INTRAVASCULAR PRESSURE WIRE/FFR STUDY N/A 06/01/2021   Procedure: INTRAVASCULAR PRESSURE WIRE/FFR STUDY;  Surgeon: Belva Crome, MD;  Location: [place] [facility];  Service: Cardiovascular;  Laterality: N/A;   LEFT HEART CATH AND CORONARY ANGIOGRAPHY N/A 09/25/2018   Procedure: LEFT HEART CATH AND CORONARY ANGIOGRAPHY;  Surgeon: Belva Crome, MD;  Location: [place] [facility];   Service: Cardiovascular;  Laterality: N/A;   LEFT HEART CATH AND CORONARY ANGIOGRAPHY N/A 06/01/2021   Procedure: LEFT HEART CATH AND CORONARY ANGIOGRAPHY;  Surgeon: Belva Crome, MD;  Location: [place] [facility];  Service: Cardiovascular;  Laterality: N/A;   POLYPECTOMY  06/27/2022   Procedure: POLYPECTOMY;  Surgeon: Harvel Quale, MD;  [facility];  Service: Gastroenterology;;   Azzie Almas DILATION  07/18/2021   Procedure: Azzie Almas DILATION;  Surgeon: Montez Morita, Quillian Quince, MD;  [facility];  Service: Gastroenterology;;   Lia Foyer LIFTING INJECTION  06/27/2022   Procedure: SUBMUCOSAL LIFTING INJECTION;  Surgeon: Harvel Quale, MD;  [facility];  Service: Gastroenterology;;   SUBMUCOSAL TATTOO INJECTION  06/27/2022   Procedure: SUBMUCOSAL TATTOO INJECTION;  Surgeon: Montez Morita, Quillian Quince, MD;  [facility];  Service: Gastroenterology;;   TONSILLECTOMY  1954     Current Meds  Medication Sig   Biotin 10000 MCG TABS Take 10,000 mcg by mouth daily at 12 noon.   buPROPion (WELLBUTRIN SR) 150 MG 12 hr tablet TAKE ONE TABLET BY MOUTH TWICE A DAY   Cholecalciferol (VITAMIN D3) 125 MCG (5000 UT) TABS Take 5,000 Units by mouth daily at 12 noon.   Coenzyme Q10 100 MG capsule Take 100 mg by mouth daily at 12 noon.   diazepam (VALIUM) 5 MG tablet Take 1 tablet (5 mg total) by mouth daily as needed for anxiety or muscle spasms.   docusate sodium (COLACE) 100 MG capsule Take 100 mg by mouth 2 (two) times daily.    esomeprazole (NEXIUM) 40 MG capsule TAKE 1 CAPSULE(40 MG) BY MOUTH DAILY BEFORE AND BREAKFAST   estradiol (ESTRACE) 2 MG tablet Take 1 tablet (2 mg total) by mouth daily. (Patient taking differently: Take 3 mg by mouth daily.)   famotidine (PEPCID) 20 MG tablet One after supper   nitroGLYCERIN (NITROSTAT) 0.4 MG SL tablet Place 1 tablet under the tongue every 5 minutes as needed for chest pain.   oxyCODONE-acetaminophen  (PERCOCET/ROXICET) 5-325 MG tablet TAKE 1 TABLET DAILY AS NEEDED FOR SEVERE PAIN.   progesterone (PROMETRIUM) 200 MG capsule Take 1 capsule (200 mg total) by mouth daily.   warfarin (COUMADIN) 5 MG tablet Take one to one and one-half tablets by mouth daily or as directed.     Allergies:   Statins, Other, and Sulfa antibiotics   ROS:   Please see the history of present illness.     All other systems reviewed and are negative.   Labs/Other Tests and Data Reviewed:    Recent Labs: 08/23/2022: BUN 24; Creatinine, Ser 0.82; Hemoglobin 11.8; Platelets 230; Potassium 4.8; Sodium 142; TSH 8.270   Recent Lipid Panel Lab Results  Component Value Date/Time   CHOL 366 (H) 08/23/2022 09:05 AM   TRIG 249 (H) 08/23/2022 09:05 AM   HDL 53 08/23/2022 09:05 AM   CHOLHDL 6.9 (H) 08/23/2022 09:05 AM   CHOLHDL 4.6 04/05/2021 02:14 PM   LDLCALC 260 (H) 08/23/2022 09:05 AM   LDLCALC 169 (H) 04/05/2021 02:14 PM    Wt Readings from  Last 3 Encounters:  06/27/22 158 lb (71.7 kg)  05/03/22 155 lb (70.3 kg)  03/27/22 155 lb (70.3 kg)     ASSESSMENT & PLAN:    PAF (paroxysmal atrial fibrillation) (HCC) On Coumadin Not on any rate controlling agents Needs to follow up with cardiology, may need beta-blocker for rate control now  Hypothyroidism Lab Results  Component Value Date   TSH 8.270 (H) 08/23/2022   Was on NP thyroid in the past Did not tolerate levothyroxine Restart NP thyroid 30 mg daily Recheck TSH and free T4 after 3 months  HLD (hyperlipidemia) Did not tolerate statin Had been taking NP thyroid Cholesterol profile concerning for familial hypercholesterolemia Referred to lipid clinic She does not have prescription coverage currently, patient assistance program details for [place] provided through MyChart   Time:   Today, I have spent 16 minutes reviewing the chart, including problem list, medications, and with the patient with telehealth technology discussing the above  problems.   Medication Adjustments/Labs and Tests Ordered: Current medicines are reviewed at length with the patient today.  Concerns regarding medicines are outlined above.   Tests Ordered: No orders of the defined types were placed in this encounter.   Medication Changes: No orders of the defined types were placed in this encounter.    Note: This dictation was prepared with Dragon dictation along with smaller phrase technology. Similar sounding words can be transcribed inadequately or may not be corrected upon review. Any transcriptional errors that result from this process are unintentional.      Disposition:  Follow up  Signed, Lindell Spar, MD  08/24/2022 11:24 AM     [facility]

## 2022-08-24 NOTE — Telephone Encounter (Signed)
Spoke with patient.

## 2022-08-24 NOTE — Assessment & Plan Note (Signed)
Lab Results  Component Value Date   TSH 8.270 (H) 08/23/2022   Was on NP thyroid in the past Did not tolerate levothyroxine Restart NP thyroid 30 mg daily Recheck TSH and free T4 after 3 months

## 2022-08-24 NOTE — Assessment & Plan Note (Addendum)
On Coumadin Not on any rate controlling agents Needs to follow up with cardiology, may need beta-blocker for rate control now

## 2022-08-28 ENCOUNTER — Ambulatory Visit: Payer: Medicare Other | Attending: Cardiology | Admitting: *Deleted

## 2022-08-28 DIAGNOSIS — I480 Paroxysmal atrial fibrillation: Secondary | ICD-10-CM

## 2022-08-28 DIAGNOSIS — Z5181 Encounter for therapeutic drug level monitoring: Secondary | ICD-10-CM

## 2022-08-28 LAB — POCT INR: INR: 3 (ref 2.0–3.0)

## 2022-08-28 NOTE — Patient Instructions (Signed)
Continue warfarin 1 1/2 tablets daily except 1 tablet on Tuesdays and Fridays Recheck INR in 4 wks

## 2022-08-31 ENCOUNTER — Ambulatory Visit
Admission: EM | Admit: 2022-08-31 | Discharge: 2022-08-31 | Disposition: A | Discharge status: Home / Self Care | Payer: Medicare Other | Attending: Family Medicine | Admitting: Family Medicine

## 2022-08-31 ENCOUNTER — Encounter: Payer: Self-pay | Admitting: Emergency Medicine

## 2022-08-31 DIAGNOSIS — R21 Rash and other nonspecific skin eruption: Secondary | ICD-10-CM

## 2022-08-31 MED ORDER — VALACYCLOVIR HCL 1 G PO TABS: 1000.0000 mg | ORAL_TABLET | Freq: Three times a day (TID) | ORAL | 0 refills | Status: AC

## 2022-08-31 MED ORDER — TRIAMCINOLONE ACETONIDE 0.1 % EX CREA
1.0000 | TOPICAL_CREAM | Freq: Two times a day (BID) | CUTANEOUS | 0 refills | Status: DC
Start: 2022-08-31 — End: 2022-09-17

## 2022-08-31 NOTE — ED Provider Notes (Signed)
[facility]    CSN: [id number] Arrival date & time: 08/31/22  1706      History   Chief Complaint No chief complaint on file.   HPI Heather [name] is a 76 y.o. female.   Patient resenting today with a red spot to her left lateral rib region that started yesterday.  Has gotten larger since yesterday and itches and she is now having a similar feeling place near the underwire of her bra on the left side.  Denies any burning or stinging sensation COVID new soaps or lotions or foods, new medications.  So far has not tried anything over-the-counter for symptoms.  Has not had her shingles vaccine.    Past Medical History:  Diagnosis Date   Adrenal adenoma    Allergy    Anemia    Anxiety    CAD (coronary artery disease)    a. 50-60% mid LAD at cardiac catheterization 2014 [facility]) b. non-obstructive CAD by cath in 09/2018   Cataract    Depression    DJD (degenerative joint disease) of cervical spine 09/12/2016   Dysrhythmia    H. pylori infection 07/19/2017   History of cardiomyopathy    History of COVID-19 12/10/2019   History of tobacco use    Hyperlipidemia    Hypothyroidism 08/24/2022   Mid back pain 02/08/2017   Neuromuscular disorder (HCC)    PAF (paroxysmal atrial fibrillation) ([place])    Diagnosis July 2017 - spontaneously resolved   Smell disturbance 06/13/2021   Statin intolerance     Patient Active Problem List   Diagnosis Date Noted   Hypothyroidism 08/24/2022   Cyst of buttocks 05/04/2022   Anxiety 04/05/2022   DOE (dyspnea on exertion) 10/09/2021   Neck pain 10/03/2021   Cognitive complaints 10/03/2021   Muscle spasm 10/03/2021   Post-COVID syndrome 07/31/2021   Dysphagia 07/10/2021   Smell disturbance 06/13/2021   Chronic right-sided thoracic back pain 09/14/2020   Tubular adenoma of colon 07/19/2017   Hx of colonic polyps 12/03/2016   Family history of colon cancer 12/03/2016   Encounter for therapeutic drug monitoring 11/21/2016    CAD (coronary artery disease) 09/12/2016   PAF (paroxysmal atrial fibrillation) ([place]) 09/12/2016   DJD (degenerative joint disease) of cervical spine 09/12/2016   Statin intolerance 09/12/2016   TIA (transient ischemic attack) 09/12/2016   Diverticulitis of colon 09/12/2016   Myofascial pain syndrome 09/06/2016   Takotsubo syndrome 09/06/2016   HLD (hyperlipidemia) 09/06/2016   Depression with anxiety 09/06/2016   Recurrent UTI (urinary tract infection) 09/06/2016    Past Surgical History:  Procedure Laterality Date   ABDOMINAL HYSTERECTOMY  1980   endometriosis   BIOPSY  07/18/2021   Procedure: BIOPSY;  Surgeon: Harvel Quale, MD;  [facility];  Service: Gastroenterology;;   CATARACT EXTRACTION Left 11/2018   COLONOSCOPY WITH PROPOFOL N/A 06/27/2022   Procedure: COLONOSCOPY WITH PROPOFOL;  Surgeon: Harvel Quale, MD;  [facility];  Service: Gastroenterology;  Laterality: N/A;  11:15   ESOPHAGOGASTRODUODENOSCOPY (EGD) WITH PROPOFOL N/A 07/18/2021   Procedure: ESOPHAGOGASTRODUODENOSCOPY (EGD) WITH PROPOFOL;  Surgeon: Harvel Quale, MD;  [facility];  Service: Gastroenterology;  Laterality: N/A;  12:00   HEMOSTASIS CLIP PLACEMENT  06/27/2022   Procedure: HEMOSTASIS CLIP PLACEMENT;  Surgeon: Harvel Quale, MD;  [facility];  Service: Gastroenterology;;   INTRAVASCULAR PRESSURE WIRE/FFR STUDY N/A 06/01/2021   Procedure: INTRAVASCULAR PRESSURE WIRE/FFR STUDY;  Surgeon: Belva Crome, MD;  Location:  [place] [facility];  Service: Cardiovascular;  Laterality: N/A;   LEFT HEART CATH AND CORONARY ANGIOGRAPHY N/A 09/25/2018   Procedure: LEFT HEART CATH AND CORONARY ANGIOGRAPHY;  Surgeon: Belva Crome, MD;  Location: [place] [facility];  Service: Cardiovascular;  Laterality: N/A;   LEFT HEART CATH AND CORONARY ANGIOGRAPHY N/A 06/01/2021   Procedure: LEFT HEART CATH AND CORONARY ANGIOGRAPHY;  Surgeon:  Belva Crome, MD;  Location: [place] [facility];  Service: Cardiovascular;  Laterality: N/A;   POLYPECTOMY  06/27/2022   Procedure: POLYPECTOMY;  Surgeon: Harvel Quale, MD;  [facility];  Service: Gastroenterology;;   Azzie Almas DILATION  07/18/2021   Procedure: Azzie Almas DILATION;  Surgeon: Montez Morita, Quillian Quince, MD;  [facility];  Service: Gastroenterology;;   Lia Foyer LIFTING INJECTION  06/27/2022   Procedure: SUBMUCOSAL LIFTING INJECTION;  Surgeon: Harvel Quale, MD;  [facility];  Service: Gastroenterology;;   SUBMUCOSAL TATTOO INJECTION  06/27/2022   Procedure: SUBMUCOSAL TATTOO INJECTION;  Surgeon: Harvel Quale, MD;  [facility];  Service: Gastroenterology;;   TONSILLECTOMY  1954    OB History     Gravida  1   Para  0   Term      Preterm      AB  1   Living  0      SAB      IAB  1   Ectopic      Multiple      Live Births               Home Medications    Prior to Admission medications   Medication Sig Start Date End Date Taking? Authorizing Provider  triamcinolone cream (KENALOG) 0.1 % Apply 1 Application topically 2 (two) times daily. 08/31/22  Yes Volney American, PA-C  valACYclovir (VALTREX) 1000 MG tablet Take 1 tablet (1,000 mg total) by mouth 3 (three) times daily for 7 days. 08/31/22 09/07/22 Yes Volney American, PA-C  Biotin 10000 MCG TABS Take 10,000 mcg by mouth daily at 12 noon.    [provider]  buPROPion (WELLBUTRIN SR) 150 MG 12 hr tablet TAKE ONE TABLET BY MOUTH TWICE A DAY 06/02/21   Hurshel Party C, MD  Cholecalciferol (VITAMIN D3) 125 MCG (5000 UT) TABS Take 5,000 Units by mouth daily at 12 noon.    [provider]  Coenzyme Q10 100 MG capsule Take 100 mg by mouth daily at 12 noon.    [provider]  diazepam (VALIUM) 5 MG tablet Take 1 tablet (5 mg total) by mouth daily as needed for anxiety or muscle spasms.  06/20/22   Sater, Nanine Means, MD  docusate sodium (COLACE) 100 MG capsule Take 100 mg by mouth 2 (two) times daily.     [provider]  esomeprazole (NEXIUM) 40 MG capsule TAKE 1 CAPSULE(40 MG) BY MOUTH DAILY BEFORE AND BREAKFAST 11/20/21   Rehman, Mechele Dawley, MD  estradiol (ESTRACE) 2 MG tablet Take 1 tablet (2 mg total) by mouth daily. Patient taking differently: Take 3 mg by mouth daily. 03/27/21   Doree Albee, MD  famotidine (PEPCID) 20 MG tablet One after supper 10/09/21   Tanda Rockers, MD  nitroGLYCERIN (NITROSTAT) 0.4 MG SL tablet Place 1 tablet under the tongue every 5 minutes as needed for chest pain. 09/12/21   Satira Sark, MD  oxyCODONE-acetaminophen (PERCOCET/ROXICET) 5-325 MG tablet TAKE 1 TABLET DAILY AS NEEDED FOR SEVERE PAIN. 08/20/22  Dohmeier, Asencion Partridge, MD  progesterone (PROMETRIUM) 200 MG capsule Take 1 capsule (200 mg total) by mouth daily. 02/21/21   Doree Albee, MD  thyroid (NP THYROID) 30 MG tablet Take 1 tablet (30 mg total) by mouth daily before breakfast. 08/24/22   Lindell Spar, MD  warfarin (COUMADIN) 5 MG tablet Take one to one and one-half tablets by mouth daily or as directed. 08/01/22   Satira Sark, MD    Family History Family History  Problem Relation Age of Onset   COPD Mother    Hearing loss Mother    Stroke Mother    Heart disease Mother        chf, died at 1   Arthritis Father    Hypertension Father    Heart disease Father        cad, pvd, died at 38   Heart disease Sister        heart valve   Arthritis Sister    Hyperlipidemia Brother    Hypertension Brother    Cancer - Prostate Brother     Social History Social History   Tobacco Use   Smoking status: Former    Packs/day: 0.00    Types: Cigarettes    Start date: 11/12/1965    Quit date: 09/06/2008    Years since quitting: 13.9   Smokeless tobacco: Never  Vaping Use   Vaping Use: Never used  Substance Use Topics   Alcohol use: Yes    Alcohol/week: 1.0  standard drink of alcohol    Types: 1 Glasses of wine per week    Comment: occ   Drug use: Yes    Types: Marijuana    Comment: last use few days ago     Allergies   Statins, Other, and Sulfa antibiotics   Review of Systems Review of Systems Per HPI  Physical Exam Triage Vital Signs ED Triage Vitals  Enc Vitals Group     BP 08/31/22 1712 (!) 162/78     Pulse Rate 08/31/22 1712 (!) 59     Resp 08/31/22 1712 18     Temp 08/31/22 1712 97.7 F (36.5 C)     Temp Source 08/31/22 1712 Oral     SpO2 08/31/22 1712 97 %     Weight --      Height --      Head Circumference --      Peak Flow --      Pain Score 08/31/22 1713 0     Pain Loc --      Pain Edu? --      Excl. in [place]? --    No data found.  Updated Vital Signs BP (!) 162/78 (BP Location: Right Arm)   Pulse (!) 59   Temp 97.7 F (36.5 C) (Oral)   Resp 18   LMP 07/08/1979 (Approximate)   SpO2 97%   Visual Acuity Right Eye Distance:   Left Eye Distance:   Bilateral Distance:    Right Eye Near:   Left Eye Near:    Bilateral Near:     Physical Exam Vitals and nursing note reviewed.  Constitutional:      Appearance: Normal appearance. She is not ill-appearing.  HENT:     Head: Atraumatic.  Eyes:     Extraocular Movements: Extraocular movements intact.     Conjunctiva/sclera: Conjunctivae normal.  Cardiovascular:     Rate and Rhythm: Normal rate and regular rhythm.     Heart sounds: Normal heart sounds.  Pulmonary:     Effort: Pulmonary effort is normal.     Breath sounds: Normal breath sounds.  Musculoskeletal:        General: Normal range of motion.     Cervical back: Normal range of motion and neck supple.  Skin:    General: Skin is warm and dry.     Findings: Erythema present.     Comments: Irregularly-shaped 1.5 cm area of erythema with small papules within on the left lateral rib region, several smaller erythematous patches along the same line more anteriorly where the underwire of her bra meets   Neurological:     Mental Status: She is alert and oriented to person, place, and time.  Psychiatric:        Mood and Affect: Mood normal.        Thought Content: Thought content normal.        Judgment: Judgment normal.      UC Treatments / Results  Labs (all labs ordered are listed, but only abnormal results are displayed) Labs Reviewed - No data to display  EKG   Radiology No results found.  Procedures Procedures (including critical care time)  Medications Ordered in UC Medications - No data to display  Initial Impression / Assessment and Plan / UC Course  I have reviewed the triage vital signs and the nursing notes.  Pertinent labs & imaging results that were available during my care of the patient were reviewed by me and considered in my medical decision making (see chart for details).     Suspicious for early shingles, treat with Valtrex, triamcinolone cream in case more irritant or allergic and continue to monitor.  Final Clinical Impressions(s) / UC Diagnoses   Final diagnoses:  Rash   Discharge Instructions   None    ED Prescriptions     Medication Sig Dispense Auth. Provider   valACYclovir (VALTREX) 1000 MG tablet Take 1 tablet (1,000 mg total) by mouth 3 (three) times daily for 7 days. 21 tablet Volney American, [place]   triamcinolone cream (KENALOG) 0.1 % Apply 1 Application topically 2 (two) times daily. 60 g Volney American, [place]      PDMP not reviewed this encounter.   Volney American, [place] 08/31/22 1744

## 2022-08-31 NOTE — ED Triage Notes (Signed)
Red spot on left flank area since yesterday.  States area itches

## 2022-09-04 DIAGNOSIS — Z23 Encounter for immunization: Secondary | ICD-10-CM | POA: Diagnosis not present

## 2022-09-10 ENCOUNTER — Other Ambulatory Visit: Payer: Self-pay | Admitting: *Deleted

## 2022-09-10 DIAGNOSIS — I480 Paroxysmal atrial fibrillation: Secondary | ICD-10-CM

## 2022-09-10 NOTE — Telephone Encounter (Signed)
Called pt to verify which pharmacy to use.  She wants me to hold off on sending Rx until she can check price at [facility].  She will let me know.

## 2022-09-11 ENCOUNTER — Telehealth: Payer: Self-pay | Admitting: Cardiology

## 2022-09-11 DIAGNOSIS — I480 Paroxysmal atrial fibrillation: Secondary | ICD-10-CM

## 2022-09-11 MED ORDER — WARFARIN SODIUM 5 MG PO TABS: ORAL_TABLET | ORAL | 1 refills | Status: DC

## 2022-09-11 NOTE — Telephone Encounter (Signed)
*  STAT* If patient is at the pharmacy, call can be transferred to refill team.   1. Which medications need to be refilled? (please list name of each medication and dose if known) warfarin (COUMADIN) 5 MG tablet  2. Which pharmacy/location (including street and city if local pharmacy) is medication to be sent to? [facility], [address]  3. Do they need a 30 day or 90 day supply? [place]

## 2022-09-11 NOTE — Telephone Encounter (Signed)
Refill request for warfarin:  Last INR was 3.0 on 08/28/22 Next INR due 09/25/22 LOV was 05/08/22  D Dunn PA-C  Refill approved.

## 2022-09-17 ENCOUNTER — Ambulatory Visit (INDEPENDENT_AMBULATORY_CARE_PROVIDER_SITE_OTHER): Payer: Medicare Other | Admitting: Neurology

## 2022-09-17 ENCOUNTER — Encounter: Payer: Self-pay | Admitting: Neurology

## 2022-09-17 ENCOUNTER — Encounter: Payer: Medicare Other | Admitting: Internal Medicine

## 2022-09-17 VITALS — BP 141/62 | HR 63

## 2022-09-17 DIAGNOSIS — U099 Post covid-19 condition, unspecified: Secondary | ICD-10-CM | POA: Diagnosis not present

## 2022-09-17 DIAGNOSIS — R42 Dizziness and giddiness: Secondary | ICD-10-CM | POA: Diagnosis not present

## 2022-09-17 DIAGNOSIS — M62838 Other muscle spasm: Secondary | ICD-10-CM | POA: Diagnosis not present

## 2022-09-17 DIAGNOSIS — M549 Dorsalgia, unspecified: Secondary | ICD-10-CM

## 2022-09-17 DIAGNOSIS — M7918 Myalgia, other site: Secondary | ICD-10-CM

## 2022-09-17 MED ORDER — ONABOTULINUMTOXINA 100 UNITS IJ SOLR
100.0000 [IU] | Freq: Once | INTRAMUSCULAR | Status: AC
  Administered 2022-09-17: 100 [IU] via INTRAMUSCULAR

## 2022-09-17 NOTE — Progress Notes (Addendum)
[facility]  PATIENT: Heather [name] DOB: 01-01-1946  REFERRING DOCTOR OR PCP:  Blanchie Serve SOURCE: patient  _________________________________   HISTORICAL  CHIEF COMPLAINT:  Chief Complaint  Patient presents with   Follow-up    Pt in room #1 and alone. Pt here today for f/u for myofascial pain.    HISTORY OF PRESENT ILLNESS:  Heather [name] is a 76 y.o. woman who has had chronic right-sided mid back pain since 2013.      Update 06/19/2022 Her myofascial pain in the right trunk has done very well over the last 3 months.  She continues to get a benefit from the Botox injections.  She did not have severe pain at any time during the last 3 months.   When she does have more pain, she will take half of a Percocet.  If she notes more spasticity she will take half of Valium.  The last prescription has lasted more than 6 months.  She notes being fairly active and does gardening.  She notes no worsening difficulties with cognition.  A few visits ago she had noted some milder memory loss and language difficulty but this is stable.  Most nights she sleeps well but she has rare insomnia when pain is worse and will rarely take diazepam (usually just one or two a month).  She never combines with a percocet.   She also reports more fatigue.       She has no  new weakness.  She does have some back and leg pain more on the right.    Lumbar MRI in 2014 showed DJD at L3-L4 with spondylolisthesis and possible effects on L3 nerve roots.    She is on coumadin for atrial fibrillation.    She has had several months of intermittent spells of lighheadedness.   This is usually worse in the middle of the night and can occur in any position.      IMAGING:  MRI results from 10/14/2013: The MRI of the brain showed age related atrophy and minimal chronic microvascular ischemic change. MRI of the cervical spine showed multilevel mild degenerative changes with left paramedian disc herniation  at C3-C4 and left disc protrusion at C4-C5 and C5-C6 and midline disc herniation at C6-C7. There was no report of nerve root compression. MRI of the thoracic spine showed disc desiccation but no herniation or protrusions. MRI of the lumbar spine showed disc bulges at T12-L1 and L2-L3 and disc bulge with facet hypertrophy at L3-L4 and disc bulge with right foraminal annular tear at L4-L5.  MRI brain 7/4/2-22 was personally reviewed.  It shows mild generalized atrophy.  No acute findings.     REVIEW OF SYSTEMS: Constitutional: No fevers, chills, sweats, or change in appetite Eyes: No visual changes, double vision, eye pain Ear, nose and throat: No hearing loss, ear pain, nasal congestion, sore throat Cardiovascular: No chest pain, palpitations.   She has atrial fibrillation and is on warfarin Respiratory:  No shortness of breath at rest or with exertion.   No wheezes GastrointestinaI: No nausea, vomiting, diarrhea, abdominal pain, fecal incontinence Genitourinary:  No dysuria, urinary retention or frequency.  No nocturia. Musculoskeletal:  No neck pain.  She has back pain/thoracic pain as above Integumentary: No rash, pruritus, skin lesions Neurological: as above Psychiatric: No depression at this time.  No anxiety Endocrine: No palpitations, diaphoresis, change in appetite, change in weigh or increased thirst Hematologic/Lymphatic:  No anemia, purpura, petechiae. Allergic/Immunologic: No itchy/runny eyes, nasal congestion, recent allergic  reactions, rashes  ALLERGIES: Allergies  Allergen Reactions   Statins Other (See Comments)    Muscle weakness   Other     EKG pads causes blisters    Sulfa Antibiotics Rash    HOME MEDICATIONS:  Current Outpatient Medications:    Biotin 10000 MCG TABS, Take 10,000 mcg by mouth daily at 12 noon., Disp: , Rfl:    buPROPion (WELLBUTRIN SR) 150 MG 12 hr tablet, TAKE ONE TABLET BY MOUTH TWICE A DAY, Disp: 180 tablet, Rfl: 1   Cholecalciferol (VITAMIN  D3) 125 MCG (5000 UT) TABS, Take 5,000 Units by mouth daily at 12 noon., Disp: , Rfl:    Coenzyme Q10 100 MG capsule, Take 100 mg by mouth daily at 12 noon., Disp: , Rfl:    diazepam (VALIUM) 5 MG tablet, Take 1 tablet (5 mg total) by mouth daily as needed for anxiety or muscle spasms., Disp: 30 tablet, Rfl: 0   docusate sodium (COLACE) 100 MG capsule, Take 100 mg by mouth 2 (two) times daily. , Disp: , Rfl:    estradiol (ESTRACE) 2 MG tablet, Take 1 tablet (2 mg total) by mouth daily. (Patient taking differently: Take 3 mg by mouth daily.), Disp: 90 tablet, Rfl: 1   nitroGLYCERIN (NITROSTAT) 0.4 MG SL tablet, Place 1 tablet under the tongue every 5 minutes as needed for chest pain., Disp: 25 tablet, Rfl: 1   oxyCODONE-acetaminophen (PERCOCET/ROXICET) 5-325 MG tablet, TAKE 1 TABLET DAILY AS NEEDED FOR SEVERE PAIN., Disp: 30 tablet, Rfl: 0   progesterone (PROMETRIUM) 200 MG capsule, Take 1 capsule (200 mg total) by mouth daily., Disp: 90 capsule, Rfl: 1   thyroid (NP THYROID) 30 MG tablet, Take 1 tablet (30 mg total) by mouth daily before breakfast., Disp: 90 tablet, Rfl: 0   warfarin (COUMADIN) 5 MG tablet, Take 1 1/2 tablets daily except 1 tablet on Tuesdays and Fridays or as directed., Disp: 135 tablet, Rfl: 1  PAST MEDICAL HISTORY: Past Medical History:  Diagnosis Date   Adrenal adenoma    Allergy    Anemia    Anxiety    CAD (coronary artery disease)    a. 50-60% mid LAD at cardiac catheterization 2014 [facility]) b. non-obstructive CAD by cath in 09/2018   Cataract    Depression    DJD (degenerative joint disease) of cervical spine 09/12/2016   Dysrhythmia    H. pylori infection 07/19/2017   History of cardiomyopathy    History of COVID-19 12/10/2019   History of tobacco use    Hyperlipidemia    Hypothyroidism 08/24/2022   Mid back pain 02/08/2017   Neuromuscular disorder (HCC)    PAF (paroxysmal atrial fibrillation) ([place])    Diagnosis July 2017 - spontaneously resolved    Smell disturbance 06/13/2021   Statin intolerance     PAST SURGICAL HISTORY: Past Surgical History:  Procedure Laterality Date   ABDOMINAL HYSTERECTOMY  1980   endometriosis   BIOPSY  07/18/2021   Procedure: BIOPSY;  Surgeon: Harvel Quale, MD;  [facility];  Service: Gastroenterology;;   CATARACT EXTRACTION Left 11/2018   COLONOSCOPY WITH PROPOFOL N/A 06/27/2022   Procedure: COLONOSCOPY WITH PROPOFOL;  Surgeon: Harvel Quale, MD;  [facility];  Service: Gastroenterology;  Laterality: N/A;  11:15   ESOPHAGOGASTRODUODENOSCOPY (EGD) WITH PROPOFOL N/A 07/18/2021   Procedure: ESOPHAGOGASTRODUODENOSCOPY (EGD) WITH PROPOFOL;  Surgeon: Harvel Quale, MD;  [facility];  Service: Gastroenterology;  Laterality: N/A;  12:00   HEMOSTASIS CLIP PLACEMENT  06/27/2022   Procedure: HEMOSTASIS CLIP PLACEMENT;  Surgeon: Harvel Quale, MD;  [facility];  Service: Gastroenterology;;   INTRAVASCULAR PRESSURE WIRE/FFR STUDY N/A 06/01/2021   Procedure: INTRAVASCULAR PRESSURE WIRE/FFR STUDY;  Surgeon: Belva Crome, MD;  Location: [facility];  Service: Cardiovascular;  Laterality: N/A;   LEFT HEART CATH AND CORONARY ANGIOGRAPHY N/A 09/25/2018   Procedure: LEFT HEART CATH AND CORONARY ANGIOGRAPHY;  Surgeon: Belva Crome, MD;  Location: [facility];  Service: Cardiovascular;  Laterality: N/A;   LEFT HEART CATH AND CORONARY ANGIOGRAPHY N/A 06/01/2021   Procedure: LEFT HEART CATH AND CORONARY ANGIOGRAPHY;  Surgeon: Belva Crome, MD;  Location: [facility];  Service: Cardiovascular;  Laterality: N/A;   POLYPECTOMY  06/27/2022   Procedure: POLYPECTOMY;  Surgeon: Harvel Quale, MD;  [facility];  Service: Gastroenterology;;   Azzie Almas DILATION  07/18/2021   Procedure: Azzie Almas DILATION;  Surgeon: Montez Morita, Quillian Quince, MD;  [facility];  Service: Gastroenterology;;    SUBMUCOSAL LIFTING INJECTION  06/27/2022   Procedure: SUBMUCOSAL LIFTING INJECTION;  Surgeon: Montez Morita, Quillian Quince, MD;  [facility];  Service: Gastroenterology;;   SUBMUCOSAL TATTOO INJECTION  06/27/2022   Procedure: SUBMUCOSAL TATTOO INJECTION;  Surgeon: Harvel Quale, MD;  [facility];  Service: Gastroenterology;;   TONSILLECTOMY  1954    FAMILY HISTORY: Family History  Problem Relation Age of Onset   COPD Mother    Hearing loss Mother    Stroke Mother    Heart disease Mother        chf, died at 85   Arthritis Father    Hypertension Father    Heart disease Father        cad, pvd, died at 103   Heart disease Sister        heart valve   Arthritis Sister    Hyperlipidemia Brother    Hypertension Brother    Cancer - Prostate Brother     SOCIAL HISTORY:  Social History   Socioeconomic History   Marital status: Married    Spouse name: Al   Number of children: 0   Years of education: 18   Highest education level: Not on file  Occupational History   Occupation: Probation officer    Comment: from home  Tobacco Use   Smoking status: Former    Packs/day: 0.00    Types: Cigarettes    Start date: 11/12/1965    Quit date: 09/06/2008    Years since quitting: 14.0   Smokeless tobacco: Never  Vaping Use   Vaping Use: Never used  Substance and Sexual Activity   Alcohol use: Yes    Alcohol/week: 1.0 standard drink of alcohol    Types: 1 Glasses of wine per week    Comment: occ   Drug use: Yes    Types: Marijuana    Comment: last use few days ago   Sexual activity: Yes    Birth control/protection: Post-menopausal  Other Topics Concern   Not on file  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   Lives with husband AL   No children   Healthy diet and lifestyle   Social Determinants of Health   Financial Resource Strain: Low Risk  (09/13/2021)   Overall Financial Resource Strain (CARDIA)    Difficulty of Paying Living Expenses: Not hard at  all  Food Insecurity: No Food Insecurity (09/13/2021)   Hunger Vital Sign    Worried  About Running Out of Food in the Last Year: Never true    Ran Out of Food in the Last Year: Never true  Transportation Needs: No Transportation Needs (09/13/2021)   PRAPARE - Hydrologist (Medical): No    Lack of Transportation (Non-Medical): No  Physical Activity: Insufficiently Active (09/13/2021)   Exercise Vital Sign    Days of Exercise per Week: 3 days    Minutes of Exercise per Session: 30 min  Stress: No Stress Concern Present (09/13/2021)   [place]    Feeling of Stress : Only a little  Social Connections: Moderately Isolated (09/13/2021)   Social Connection and Isolation Panel [NHANES]    Frequency of Communication with Friends and Family: More than three times a week    Frequency of Social Gatherings with Friends and Family: Twice a week    Attends Religious Services: Never    Marine scientist or Organizations: No    Attends Archivist Meetings: Never    Marital Status: Married  Human resources officer Violence: Not At Risk (09/13/2021)   Humiliation, Afraid, Rape, and Kick questionnaire    Fear of Current or Ex-Partner: No    Emotionally Abused: No    Physically Abused: No    Sexually Abused: No     PHYSICAL EXAM   General: The patient is well-developed and well-nourished and in no acute distress.  Head is normocephalic and atraumatic.   Musculoskeletal : There is tenderness in the mid to lower thoracic intercoastal muscles from T6-T9 on the right .  The neck was nontender today.  Neurologic Exam  Mental status: The patient is alert and oriented x 3 at the time of the examination.   Focus and attention seem normal.   Speech is normal.  Cranial nerves: Extraocular muscles are intact.  Facial strength is normal.   . No obvious hearing deficits are noted.  Motor: She has normal  muscle tone, muscle bulk and muscle strength in the arms or legs.  gait and station: Gait and station are normal.  The Romberg is negative    ASSESSMENT AND PLAN  1. Myofascial pain syndrome   2. Muscle spasm   3. Post-COVID syndrome   4. Mid back pain   5. Lightheadedness        1.  Inject 100 units of Botox using 30-gauge needle split into tender points below the T6-T9 ribs into the intercostal muscles near the midclavicular line.  There were no complications and she tolerated the injections well.   2.   She should continue to stay active and exercise as tolerated.  Can take rare Percocet as needed for days when there is more intense pain.  If severe spasms take Valium 3.   If spells of lightheadedness worsen, consider MRI or a of the neck and head.   She will return in 3-4 months for next Botox or sooner if  new or worsening neurologic symptoms.   Margree Gimbel A. Felecia Shelling, MD, PhD 46/03/6811, 7:51 PM Certified in Neurology, Clinical Neurophysiology, Sleep Medicine, Pain Medicine and Neuroimaging  [facility] [address] [place], [place] [address] [phone]

## 2022-09-17 NOTE — Addendum Note (Signed)
Addended by: Arlice Colt A on: 09/17/2022 04:02 PM   Modules accepted: Orders

## 2022-09-18 ENCOUNTER — Encounter: Payer: Self-pay | Admitting: Neurology

## 2022-09-19 ENCOUNTER — Other Ambulatory Visit: Payer: Self-pay | Admitting: Neurology

## 2022-09-19 ENCOUNTER — Ambulatory Visit (INDEPENDENT_AMBULATORY_CARE_PROVIDER_SITE_OTHER): Payer: Medicare Other | Admitting: Internal Medicine

## 2022-09-19 ENCOUNTER — Encounter: Payer: Self-pay | Admitting: Internal Medicine

## 2022-09-19 DIAGNOSIS — Z0000 Encounter for general adult medical examination without abnormal findings: Secondary | ICD-10-CM | POA: Diagnosis not present

## 2022-09-19 MED ORDER — METHYLPREDNISOLONE 4 MG PO TABS: ORAL_TABLET | ORAL | 0 refills | Status: DC

## 2022-09-19 NOTE — Patient Instructions (Signed)
  Ms. Heather [name] , Thank you for taking time to come for your Medicare Wellness Visit. I appreciate your ongoing commitment to your health goals. Please review the following plan we discussed and let me know if I can assist you in the future.   These are the goals we discussed:  Goals      Patient Stated     Would like to lose weight and be more active.        This is a list of the screening recommended for you and due dates:  Health Maintenance  Topic Date Due   Tetanus Vaccine  Never done   Zoster (Shingles) Vaccine (1 of 2) Never done   Medicare Annual Wellness Visit  09/13/2022   COVID-19 Vaccine (4 - Pfizer series) 10/30/2022   Screening for Lung Cancer  04/07/2023   Colon Cancer Screening  06/27/2025   Pneumonia Vaccine  Completed   Flu Shot  Completed   DEXA scan (bone density measurement)  Completed   Hepatitis C Screening: USPSTF Recommendation to screen - Ages 38-79 yo.  Completed   HPV Vaccine  Aged Out

## 2022-09-19 NOTE — Progress Notes (Signed)
Subjective:  This is a telephone encounter between Heather [name] and Lorene Dy on 09/19/2022 for AWV. The visit was conducted with the patient located at home and Lorene Dy at [facility]. The patient's identity was confirmed using their DOB and current address. The patient has consented to being evaluated through a telephone encounter and understands the associated risks (an examination cannot be done and the patient may need to come in for an appointment) / benefits (allows the patient to remain at home, decreasing exposure to coronavirus).     Heather [name] is a 76 y.o. female who presents for Medicare Annual (Subsequent) preventive examination.  Review of Systems    Review of Systems  All other systems reviewed and are negative.    Objective:    There were no vitals filed for this visit. There is no height or weight on file to calculate BMI.     09/19/2022    2:16 PM 06/27/2022    9:34 AM 09/13/2021    9:30 AM 07/18/2021   10:27 AM 07/13/2021    2:12 PM 06/01/2021    6:29 AM 05/15/2021    3:37 AM  Advanced Directives  Does Patient Have a Medical Advance Directive? Yes Yes No No No No No  Does patient want to make changes to medical advance directive? Yes (MAU/Ambulatory/Procedural Areas - Information given)  No - Patient declined      Would patient like information on creating a medical advance directive?   No - Patient declined No - Patient declined No - Patient declined No - Patient declined     Current Medications (verified) Outpatient Encounter Medications as of 09/19/2022  Medication Sig   Biotin 10000 MCG TABS Take 10,000 mcg by mouth daily at 12 noon.   buPROPion (WELLBUTRIN SR) 150 MG 12 hr tablet TAKE ONE TABLET BY MOUTH TWICE A DAY   Cholecalciferol (VITAMIN D3) 125 MCG (5000 UT) TABS Take 5,000 Units by mouth daily at 12 noon.   Coenzyme Q10 100 MG capsule Take 100 mg by mouth daily at 12 noon.   diazepam (VALIUM) 5 MG tablet Take 1 tablet (5 mg total) by mouth daily as  needed for anxiety or muscle spasms.   docusate sodium (COLACE) 100 MG capsule Take 100 mg by mouth 2 (two) times daily.    estradiol (ESTRACE) 2 MG tablet Take 1 tablet (2 mg total) by mouth daily. (Patient taking differently: Take 3 mg by mouth daily.)   nitroGLYCERIN (NITROSTAT) 0.4 MG SL tablet Place 1 tablet under the tongue every 5 minutes as needed for chest pain.   oxyCODONE-acetaminophen (PERCOCET/ROXICET) 5-325 MG tablet TAKE 1 TABLET DAILY AS NEEDED FOR SEVERE PAIN.   progesterone (PROMETRIUM) 200 MG capsule Take 1 capsule (200 mg total) by mouth daily.   thyroid (NP THYROID) 30 MG tablet Take 1 tablet (30 mg total) by mouth daily before breakfast.   warfarin (COUMADIN) 5 MG tablet Take 1 1/2 tablets daily except 1 tablet on Tuesdays and Fridays or as directed.   No facility-administered encounter medications on file as of 09/19/2022.    Allergies (verified) Statins, Other, and Sulfa antibiotics   History: Past Medical History:  Diagnosis Date   Adrenal adenoma    Allergy    Anemia    Anxiety    CAD (coronary artery disease)    a. 50-60% mid LAD at cardiac catheterization 2014 [facility]) b. non-obstructive CAD by cath in 09/2018   Cataract    Depression    DJD (degenerative  joint disease) of cervical spine 09/12/2016   Dysrhythmia    H. pylori infection 07/19/2017   History of cardiomyopathy    History of COVID-19 12/10/2019   History of tobacco use    Hyperlipidemia    Hypothyroidism 08/24/2022   Mid back pain 02/08/2017   Neuromuscular disorder (HCC)    PAF (paroxysmal atrial fibrillation) [facility])    Diagnosis July 2017 - spontaneously resolved   Smell disturbance 06/13/2021   Statin intolerance    Past Surgical History:  Procedure Laterality Date   ABDOMINAL HYSTERECTOMY  1980   endometriosis   BIOPSY  07/18/2021   Procedure: BIOPSY;  Surgeon: Harvel Quale, MD;  [facility];  Service: Gastroenterology;;   CATARACT EXTRACTION Left  11/2018   COLONOSCOPY WITH PROPOFOL N/A 06/27/2022   Procedure: COLONOSCOPY WITH PROPOFOL;  Surgeon: Harvel Quale, MD;  [facility];  Service: Gastroenterology;  Laterality: N/A;  11:15   ESOPHAGOGASTRODUODENOSCOPY (EGD) WITH PROPOFOL N/A 07/18/2021   Procedure: ESOPHAGOGASTRODUODENOSCOPY (EGD) WITH PROPOFOL;  Surgeon: Harvel Quale, MD;  [facility];  Service: Gastroenterology;  Laterality: N/A;  12:00   EYE SURGERY  cataracts  2021/2022   HEMOSTASIS CLIP PLACEMENT  06/27/2022   Procedure: HEMOSTASIS CLIP PLACEMENT;  Surgeon: Harvel Quale, MD;  [facility];  Service: Gastroenterology;;   INTRAVASCULAR PRESSURE WIRE/FFR STUDY N/A 06/01/2021   Procedure: INTRAVASCULAR PRESSURE WIRE/FFR STUDY;  Surgeon: Belva Crome, MD;  Location: [facility];  Service: Cardiovascular;  Laterality: N/A;   LEFT HEART CATH AND CORONARY ANGIOGRAPHY N/A 09/25/2018   Procedure: LEFT HEART CATH AND CORONARY ANGIOGRAPHY;  Surgeon: Belva Crome, MD;  Location: [place] [facility];  Service: Cardiovascular;  Laterality: N/A;   LEFT HEART CATH AND CORONARY ANGIOGRAPHY N/A 06/01/2021   Procedure: LEFT HEART CATH AND CORONARY ANGIOGRAPHY;  Surgeon: Belva Crome, MD;  Location: [place] [facility];  Service: Cardiovascular;  Laterality: N/A;   POLYPECTOMY  06/27/2022   Procedure: POLYPECTOMY;  Surgeon: Harvel Quale, MD;  [facility];  Service: Gastroenterology;;   Azzie Almas DILATION  07/18/2021   Procedure: Azzie Almas DILATION;  Surgeon: Montez Morita, Quillian Quince, MD;  [facility];  Service: Gastroenterology;;   SUBMUCOSAL LIFTING INJECTION  06/27/2022   Procedure: SUBMUCOSAL LIFTING INJECTION;  Surgeon: Montez Morita, Quillian Quince, MD;  [facility];  Service: Gastroenterology;;   SUBMUCOSAL TATTOO INJECTION  06/27/2022   Procedure: SUBMUCOSAL TATTOO INJECTION;  Surgeon: Montez Morita, Quillian Quince, MD;   [facility];  Service: Gastroenterology;;   TONSILLECTOMY  1954   Family History  Problem Relation Age of Onset   COPD Mother    Hearing loss Mother    Stroke Mother    Heart disease Mother        chf, died at 74   Vision loss Mother    Varicose Veins Mother    Arthritis Father    Hypertension Father    Heart disease Father        cad, pvd, died at 62   Vision loss Father    Heart disease Sister        heart valve   Arthritis Sister    Depression Sister    Hyperlipidemia Brother    Hypertension Brother    Cancer - Prostate Brother    Social History   Socioeconomic History   Marital status: Married    Spouse name: Al   Number of children: 0  Years of education: 18   Highest education level: Not on file  Occupational History   Occupation: Probation officer    Comment: from home  Tobacco Use   Smoking status: Former    Packs/day: 1.00    Years: 40.00    Total pack years: 40.00    Types: Cigarettes    Start date: 11/12/1965    Quit date: 09/06/2008    Years since quitting: 14.0   Smokeless tobacco: Never  Vaping Use   Vaping Use: Never used  Substance and Sexual Activity   Alcohol use: Yes    Alcohol/week: 2.0 standard drinks of alcohol    Types: 1 Glasses of wine, 1 Shots of liquor per week    Comment: occasional   Drug use: Yes    Types: Hydrocodone, Marijuana    Comment: prescribed Percocet for myofacial pain syndrome   Sexual activity: Yes    Birth control/protection: Post-menopausal  Other Topics Concern   Not on file  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   Lives with husband AL   No children   Healthy diet and lifestyle   Social Determinants of Health   Financial Resource Strain: Low Risk  (09/13/2022)   Overall Financial Resource Strain (CARDIA)    Difficulty of Paying Living Expenses: Not very hard  Food Insecurity: No Food Insecurity (09/13/2022)   Hunger Vital Sign    Worried About Running Out of Food in the Last Year: Never true     Ran Out of Food in the Last Year: Never true  Transportation Needs: No Transportation Needs (09/13/2022)   PRAPARE - Hydrologist (Medical): No    Lack of Transportation (Non-Medical): No  Physical Activity: Insufficiently Active (09/13/2022)   Exercise Vital Sign    Days of Exercise per Week: 2 days    Minutes of Exercise per Session: 30 min  Stress: No Stress Concern Present (09/13/2022)   [place]    Feeling of Stress : Not at all  Social Connections: Unknown (09/13/2022)   Social Connection and Isolation Panel [NHANES]    Frequency of Communication with Friends and Family: More than three times a week    Frequency of Social Gatherings with Friends and Family: Three times a week    Attends Religious Services: Not on file    Active Member of Clubs or Organizations: Yes    Attends Club or Organization Meetings: 1 to 4 times per year    Marital Status: Married    Tobacco Counseling Counseling given: Not Answered   Clinical Intake:  Pre-visit preparation completed: Yes  Pain : No/denies pain     Diabetes: No  How often do you need to have someone help you when you read instructions, pamphlets, or other written materials from your doctor or pharmacy?: 1 - Never What is the last grade level you completed in school?: Masters degree    Activities of Daily Living    09/19/2022    2:18 PM 09/13/2022   11:20 AM  In your present state of health, do you have any difficulty performing the following activities:  Hearing? 0 0  Vision? 0 0  Difficulty concentrating or making decisions? 0 0  Walking or climbing stairs? 0 0  Dressing or bathing? 0 0  Doing errands, shopping? 0 0  Preparing Food and eating ?  N  Using the Toilet?  N  In the past six months, have you accidently  leaked urine?  Y  Do you have problems with loss of bowel control?  N  Managing your Medications?  N  Managing  your Finances?  N  Housekeeping or managing your Housekeeping?  N    Patient Care Team: Lindell Spar, MD as PCP - General (Internal Medicine) Satira Sark, MD as PCP - Cardiology (Cardiology)  Indicate any recent Medical Services you may have received from other than [facility] providers in the past year (date may be approximate).     Assessment:   This is a routine wellness examination for Sharnay.  Hearing/Vision screen No results found.  Dietary issues and exercise activities discussed:     Goals Addressed   None    Depression Screen    09/19/2022    2:18 PM 08/24/2022   10:08 AM 08/22/2022    9:36 AM 08/09/2022    8:59 AM 04/05/2022    1:10 PM 04/05/2022    1:09 PM 09/13/2021    9:30 AM  PHQ 2/9 Scores  PHQ - 2 Score 0 0 1 0 0 0 0  PHQ- 9 Score  0 1 0 0      Fall Risk    09/19/2022    2:17 PM 09/13/2022   11:20 AM 08/24/2022   10:08 AM 08/22/2022    9:36 AM 08/09/2022    8:59 AM  Fall Risk   Falls in the past year? 0 0 0 0 0  Number falls in past yr: 0  0 0 0  Injury with Fall? 0  0 0 0  Risk for fall due to :   No Fall Risks No Fall Risks No Fall Risks  Follow up   Falls evaluation completed Falls evaluation completed Falls evaluation completed    [place]:  Any stairs in or around the home? Yes  If so, are there any without handrails? Yes  Home free of loose throw rugs in walkways, pet beds, electrical cords, etc? Yes  Adequate lighting in your home to reduce risk of falls? Yes   ASSISTIVE DEVICES UTILIZED TO PREVENT FALLS:  Life alert? No  Use of a cane, walker or w/c? No  Grab bars in the bathroom? Yes  Shower chair or bench in shower? No  Elevated toilet seat or a handicapped toilet? No    Cognitive Function:    09/13/2021    9:31 AM  MMSE - Mini Mental State Exam  Not completed: Unable to complete      09/14/2020   11:30 AM  Montreal Cognitive Assessment   Visuospatial/ Executive (0/5) 3  Naming  (0/3) 3  Attention: Read list of digits (0/2) 2  Attention: Read list of letters (0/1) 1  Attention: Serial 7 subtraction starting at 100 (0/3) 3  Language: Repeat phrase (0/2) 2  Language : Fluency (0/1) 1  Abstraction (0/2) 2  Delayed Recall (0/5) 5  Orientation (0/6) 6  Total 28  Adjusted Score (based on education) 28      09/19/2022    2:18 PM 09/13/2021    9:31 AM  6CIT Screen  What Year? 0 points 0 points  What month? 0 points 0 points  What time? 0 points 0 points  Count back from 20 0 points 0 points  Months in reverse 0 points 0 points  Repeat phrase 0 points 0 points  Total Score 0 points 0 points    Immunizations Immunization History  Administered Date(s) Administered   Fluad Quad(high  Dose 65+) 08/17/2019, 09/13/2020, 07/31/2021   Influenza, High Dose Seasonal PF 08/30/2017   Moderna SARS-COV2 Booster Vaccination 02/24/2021   Moderna Sars-Covid-2 Vaccination 09/22/2020   PFIZER(Purple Top)SARS-COV-2 Vaccination 01/25/2020, 03/18/2020   Pneumococcal Conjugate-13 12/16/2017   Pneumococcal Polysaccharide-23 11/04/2019    TDAP status: Up to date  Flu Vaccine status: Up to date  Pneumococcal vaccine status: Up to date  Covid-19 vaccine status: Completed vaccines  Qualifies for Shingles Vaccine? Yes   Zostavax completed No   Shingrix Completed?: No.    Education has been provided regarding the importance of this vaccine. Patient has been advised to call insurance company to determine out of pocket expense if they have not yet received this vaccine. Advised may also receive vaccine at local pharmacy or Health Dept. Verbalized acceptance and understanding.  Screening Tests Health Maintenance  Topic Date Due   TETANUS/TDAP  Never done   Zoster Vaccines- Shingrix (1 of 2) Never done   COVID-19 Vaccine (4 - Pfizer series) 04/21/2021   INFLUENZA VACCINE  06/12/2022   Medicare Annual Wellness (AWV)  09/13/2022   COLONOSCOPY (Pts 45-42yr Insurance coverage will  need to be confirmed)  06/27/2025   Pneumonia Vaccine 76 Years old  Completed   DEXA SCAN  Completed   Hepatitis C Screening  Completed   HPV VACCINES  Aged Out    Health Maintenance  Health Maintenance Due  Topic Date Due   TETANUS/TDAP  Never done   Zoster Vaccines- Shingrix (1 of 2) Never done   COVID-19 Vaccine (4 - Pfizer series) 04/21/2021   INFLUENZA VACCINE  06/12/2022   Medicare Annual Wellness (AWV)  09/13/2022    Colorectal cancer screening: Type of screening: Colonoscopy. Completed 06/27/2022. Repeat every 3 years  Mammogram status: Completed 05/02/2022. Repeat every year  Bone Density status: Completed 11/20/2018. Results reflect: Bone density results: NORMAL.   Lung Cancer Screening: (Low Dose CT Chest recommended if Age 10061-80years, 30 pack-year currently smoking OR have quit w/in 15years.) does not qualify. Completed 03/2022    Additional Screening:  Hepatitis C Screening: does not qualify; Completed 04/28/2019  Vision Screening: Recommended annual ophthalmology exams for early detection of glaucoma and other disorders of the eye. Is the patient up to date with their annual eye exam?  Yes  Who is the provider or what is the name of the office in which the patient attends annual eye exams? Dr.Groat If pt is not established with a provider, would they like to be referred to a provider to establish care? Yes .   Dental Screening: Recommended annual dental exams for proper oral hygiene  Community Resource Referral / Chronic Care Management: CRR required this visit?  No   CCM required this visit?  No      Plan:     I have personally reviewed and noted the following in the patient's chart:   Medical and social history Use of alcohol, tobacco or illicit drugs  Current medications and supplements including opioid prescriptions. Patient is not currently taking opioid prescriptions. Functional ability and status Nutritional status Physical activity Advanced  directives List of other physicians Hospitalizations, surgeries, and ER visits in previous 12 months Vitals Screenings to include cognitive, depression, and falls Referrals and appointments  In addition, I have reviewed and discussed with patient certain preventive protocols, quality metrics, and best practice recommendations. A written personalized care plan for preventive services as well as general preventive health recommendations were provided to patient.     JLorene Dy MD  09/19/2022        

## 2022-09-25 ENCOUNTER — Encounter: Payer: Self-pay | Admitting: Internal Medicine

## 2022-09-25 ENCOUNTER — Ambulatory Visit (INDEPENDENT_AMBULATORY_CARE_PROVIDER_SITE_OTHER): Payer: Medicare Other | Admitting: Internal Medicine

## 2022-09-25 ENCOUNTER — Ambulatory Visit: Payer: Medicare Other | Attending: Cardiology | Admitting: *Deleted

## 2022-09-25 VITALS — BP 151/79 | HR 62 | Resp 16

## 2022-09-25 DIAGNOSIS — I480 Paroxysmal atrial fibrillation: Secondary | ICD-10-CM

## 2022-09-25 DIAGNOSIS — Z5181 Encounter for therapeutic drug level monitoring: Secondary | ICD-10-CM | POA: Diagnosis not present

## 2022-09-25 DIAGNOSIS — M25579 Pain in unspecified ankle and joints of unspecified foot: Secondary | ICD-10-CM | POA: Diagnosis not present

## 2022-09-25 DIAGNOSIS — M79673 Pain in unspecified foot: Secondary | ICD-10-CM | POA: Diagnosis not present

## 2022-09-25 LAB — POCT INR: INR: 3.1 — AB (ref 2.0–3.0)

## 2022-09-25 NOTE — Patient Instructions (Signed)
Decrease warfarin to 1 1/2 tablets daily except 1 tablet on Tuesdays, Thursdays and Saturdays Recheck INR in 6 wks

## 2022-09-25 NOTE — Progress Notes (Unsigned)
     CC: Foot Pain (Bilateral foot pain for 1-2 months (9/14) Mainly the heels and ankles of both feet ache and it interferes with her walking sometimes. )    HPI:Ms.Heather [name] is a 76 y.o. female who presents for evaluation of foot and ankle pain. For the details of today's visit, please refer to the assessment and plan.   Past Medical History:  Diagnosis Date   Adrenal adenoma    Allergy    Anemia    Anxiety    CAD (coronary artery disease)    a. 50-60% mid LAD at cardiac catheterization 2014 [facility]) b. non-obstructive CAD by cath in 09/2018   Cataract    Depression    DJD (degenerative joint disease) of cervical spine 09/12/2016   Dysrhythmia    H. pylori infection 07/19/2017   History of cardiomyopathy    History of COVID-19 12/10/2019   History of tobacco use    Hyperlipidemia    Hypothyroidism 08/24/2022   Mid back pain 02/08/2017   Neuromuscular disorder (HCC)    PAF (paroxysmal atrial fibrillation) ([place])    Diagnosis July 2017 - spontaneously resolved   Smell disturbance 06/13/2021   Statin intolerance      Physical Exam: Vitals:   09/25/22 1505  BP: (!) 151/79  Pulse: 62  Resp: 16  SpO2: 99%   Physical Exam Constitutional:      General: She is not in acute distress.    Appearance: She is not ill-appearing.  Musculoskeletal:     Right ankle: No swelling, deformity or ecchymosis. Tenderness (tenderness to palpation inferior to lateral mallelous) present. Normal range of motion. Anterior drawer test negative.     Right Achilles Tendon: No tenderness.  Neurological:     Sensory: No sensory deficit.     Motor: No weakness.     Gait: Gait abnormal (antalgic gait).    Assessment & Plan:   Foot and ankle pain Patient has been experiencing bilateral foot and ankle pain since resuming a walking routine approximately a month ago. She was wearing older tennis shoes. The pain is located deep in the ankle. Today left foot is not bothering her. The most  specific area of pain is between right lateral malleolus and heel. No obvious swelling on exam, but patient has had some swelling at home inferior to lateral malleolus. Pain is worse with weight barring. She has tried Dr.Scholl's orthotics before coming to visit , but not improved   Assessment/Plan: Foot and ankle pain. I expect possible tendinopathy, sinus tarsi syndrome, or arthritis. Will send for xray to rule out fracture and bone spurs. Referral to sports medicine for further evaluation and consideration of orthotics. Patient is on Warfarin, which complicates treatment with NSAID. Reports this has cause GI bleeding in the past. Recommended new tennis shoes.       Lorene Dy, MD

## 2022-09-25 NOTE — Patient Instructions (Signed)
Thank you for trusting me with your care. To recap, today we discussed the following:  Foot and ankle pain - Ambulatory referral to Sports Medicine

## 2022-09-26 ENCOUNTER — Encounter: Payer: Self-pay | Admitting: Internal Medicine

## 2022-09-26 DIAGNOSIS — M25579 Pain in unspecified ankle and joints of unspecified foot: Secondary | ICD-10-CM | POA: Insufficient documentation

## 2022-09-26 DIAGNOSIS — M79673 Pain in unspecified foot: Secondary | ICD-10-CM | POA: Insufficient documentation

## 2022-09-26 MED ORDER — DICLOFENAC SODIUM 1 % EX GEL: 4.0000 g | Freq: Four times a day (QID) | CUTANEOUS | 0 refills | Status: DC

## 2022-09-26 NOTE — Assessment & Plan Note (Addendum)
Patient has been experiencing bilateral foot and ankle pain since resuming a walking routine approximately a month ago. She was wearing older tennis shoes. The pain is located deep in the ankle. Today left foot is not bothering her. The most specific area of pain is between right lateral malleolus and heel. No obvious swelling on exam, but patient has had some swelling at home inferior to lateral malleolus. Pain is worse with weight barring. She has tried Dr.Scholl's orthotics before coming to visit , but not improved   Assessment/Plan: Foot and ankle pain. I expect possible tendinopathy, sinus tarsi syndrome, or arthritis. Recommended new tennis shoes as pain started with poor support with walking routine. Will send for xray to rule out fracture and bone spurs. Referral to sports medicine for further evaluation and consideration of orthotics. Patient is on Warfarin, which complicates treatment with NSAID. A course of PO NSAID caused GI bleeding in the past. Will send Voltaren Gel.

## 2022-09-27 ENCOUNTER — Ambulatory Visit (HOSPITAL_COMMUNITY)
Admission: RE | Admit: 2022-09-27 | Discharge: 2022-09-27 | Disposition: A | Discharge status: Home / Self Care | Payer: Medicare Other | Source: Ambulatory Visit | Attending: Internal Medicine | Admitting: Internal Medicine

## 2022-09-27 DIAGNOSIS — M25579 Pain in unspecified ankle and joints of unspecified foot: Secondary | ICD-10-CM | POA: Insufficient documentation

## 2022-09-27 DIAGNOSIS — M79673 Pain in unspecified foot: Secondary | ICD-10-CM | POA: Diagnosis not present

## 2022-09-27 DIAGNOSIS — M25571 Pain in right ankle and joints of right foot: Secondary | ICD-10-CM | POA: Diagnosis not present

## 2022-10-04 ENCOUNTER — Encounter: Payer: Self-pay | Admitting: Neurology

## 2022-10-08 ENCOUNTER — Other Ambulatory Visit: Payer: Self-pay | Admitting: *Deleted

## 2022-10-08 ENCOUNTER — Ambulatory Visit (INDEPENDENT_AMBULATORY_CARE_PROVIDER_SITE_OTHER): Payer: Medicare Other | Admitting: Family Medicine

## 2022-10-08 VITALS — BP 130/80 | Ht 64.0 in | Wt 158.0 lb

## 2022-10-08 DIAGNOSIS — M25579 Pain in unspecified ankle and joints of unspecified foot: Secondary | ICD-10-CM

## 2022-10-08 MED ORDER — OXYCODONE-ACETAMINOPHEN 5-325 MG PO TABS: ORAL_TABLET | ORAL | 0 refills | Status: DC

## 2022-10-08 NOTE — Patient Instructions (Signed)
Your pain is multifactorial - you do have plantar fasciitis but we also need to evaluate for vascular blockage, possibly neuropathic pain contributing to this. Ok to take tylenol as needed for pain. Plantar fascia stretch for 20-30 seconds (do 3 of these) in morning Lowering/raise on a step exercises 3 x 10 once or twice a day - this is very important for long term recovery. Avoid flat shoes/barefoot walking as much as possible. Consider evaluation for shoes at [employer] to get fitted for proper size, stability shoe. Green insoles with small scaphoid pads - if this is too much you can remove the pads underneath.  This arch support is as important as the exercises for the plantar fascia  We will go ahead with the arterial test (ankle-brachial indices) as well and I'll contact you with the results. Follow up with me in 1 month.

## 2022-10-08 NOTE — Progress Notes (Cosign Needed)
SUBJECTIVE:   Chief Complaint: bilateral foot pain  History of Presenting Illness:   Heather [name] is a 76 year old female with a past medical history of mid-back pain, cervical DJD, CAD, and tobacco use who presents for bilateral foot pain.  She states that the pain began about 2-3 months ago after she started walking about one mile each morning in old shoes. She had been walking for about three weeks before the pain started. Her pain fluctuates in severity and location. In the ankle, it feels like a burning or electrical shock sensation. In the heel, the pain feels sharp and it is associated with a stepping-on-something sensation. The patient also reports a generalized sense of numbness involving her forefoot including toes. Sometimes, the pain will radiate up toward her knee but never travels into her back. At rest, she is usually pain-free but sudden movements or increases in physical activity will trigger discomfort. Occasionally, the pain will manifest without any obvious provoking factor. Transitioning from rest to activity reliably causes pain and the pain increases with prolonged activity.  In order to alleviate her pain, the patient has taken ibuprofen, used CBD ointment, implemented strengthening exercises for plantar fasciitis, and tried heat pads. Only the CBD ointment really seems to have an appreciable impact on her pain level. She has not tried ice, compression, or any other medications.  Right foot radiograph performed on 11-16 was negative for fracture, dislocation, and arthropathy.    Pertinent Medical History:   Past Medical History:  Diagnosis Date   Adrenal adenoma    Allergy    Anemia    Anxiety    CAD (coronary artery disease)    a. 50-60% mid LAD at cardiac catheterization 2014 [facility]) b. non-obstructive CAD by cath in 09/2018   Cataract    Depression    DJD (degenerative joint disease) of cervical spine 09/12/2016   Dysrhythmia    H. pylori infection  07/19/2017   History of cardiomyopathy    History of COVID-19 12/10/2019   History of tobacco use    Hyperlipidemia    Hypothyroidism 08/24/2022   Mid back pain 02/08/2017   Neuromuscular disorder (HCC)    PAF (paroxysmal atrial fibrillation) ([place])    Diagnosis July 2017 - spontaneously resolved   Smell disturbance 06/13/2021   Statin intolerance       OBJECTIVE:   BP 130/80   Ht '5\' 4"'$  (1.626 m)   Wt 158 lb (71.7 kg)   LMP 07/08/1979 (Approximate)   BMI 27.12 kg/m   Physical Examination:  Bilateral feet: No erythema or swelling, normal structure with high arches, full ROM at ankle, tenderness to palpation along inferior aspect of posterior heel over calcaneus and posterolateral lateral malleolus, sensation to light touch intact across dermatomes L4-S1, strength 5/5 dorsiflexion-plantarflexion-eversion-inversion, pain not reproducible with movement against resistance, dorsalis pulses diminished, capillary refill 2-3 seconds   ASSESSMENT/PLAN:   Patient's presentation is likely multifactorial in origin. Although plantar fasciitis could be causing her heel pain, it would not fully explain her ankle pain. Further diagnostic workup for vascular and possibly neurologic etiologies is warranted, especially in the setting of diminished dorsalis pedis pulses bilaterally and subjective numbness.   > Acetaminophen as needed for symptomatic relief > Supportive shoes to wear while standing for long periods of time, avoid ambulating barefoot > Continue strengthening exercises as tolerated  > Plantar fascia stretching daily > Green insoles with small scaphoid pads > Ankle-brachial index testing    There are no diagnoses linked to this encounter.  No follow-ups on file.  Heather Nickel, MD  10/08/2022, 1:49 PM

## 2022-10-09 ENCOUNTER — Encounter: Payer: Self-pay | Admitting: Family Medicine

## 2022-10-11 ENCOUNTER — Ambulatory Visit (HOSPITAL_COMMUNITY)
Admission: RE | Admit: 2022-10-11 | Discharge: 2022-10-11 | Disposition: A | Discharge status: Home / Self Care | Payer: Medicare Other | Source: Ambulatory Visit | Attending: Family Medicine | Admitting: Family Medicine

## 2022-10-11 DIAGNOSIS — M25579 Pain in unspecified ankle and joints of unspecified foot: Secondary | ICD-10-CM | POA: Diagnosis not present

## 2022-10-17 DIAGNOSIS — H40013 Open angle with borderline findings, low risk, bilateral: Secondary | ICD-10-CM | POA: Diagnosis not present

## 2022-10-17 DIAGNOSIS — H04123 Dry eye syndrome of bilateral lacrimal glands: Secondary | ICD-10-CM | POA: Diagnosis not present

## 2022-10-17 DIAGNOSIS — H527 Unspecified disorder of refraction: Secondary | ICD-10-CM | POA: Diagnosis not present

## 2022-10-17 DIAGNOSIS — H353132 Nonexudative age-related macular degeneration, bilateral, intermediate dry stage: Secondary | ICD-10-CM | POA: Diagnosis not present

## 2022-10-17 DIAGNOSIS — H31001 Unspecified chorioretinal scars, right eye: Secondary | ICD-10-CM | POA: Diagnosis not present

## 2022-10-17 DIAGNOSIS — Z961 Presence of intraocular lens: Secondary | ICD-10-CM | POA: Diagnosis not present

## 2022-10-17 DIAGNOSIS — H35373 Puckering of macula, bilateral: Secondary | ICD-10-CM | POA: Diagnosis not present

## 2022-10-19 ENCOUNTER — Ambulatory Visit (INDEPENDENT_AMBULATORY_CARE_PROVIDER_SITE_OTHER): Payer: Medicare Other | Admitting: Podiatry

## 2022-10-19 ENCOUNTER — Encounter: Payer: Self-pay | Admitting: Podiatry

## 2022-10-19 DIAGNOSIS — M722 Plantar fascial fibromatosis: Secondary | ICD-10-CM | POA: Diagnosis not present

## 2022-10-19 NOTE — Progress Notes (Signed)
Subjective:  Patient ID: Heather [name], female    DOB: 09/26/1946,  MRN: [id number]  Chief Complaint  Patient presents with   Foot Pain    Bilateral foot pain pt stated that she has had xrays done on her foot from her primary     76 y.o. female presents with the above complaint.  Patient presents with bilateral heel pain.  Patient states pain for touch is gone worse hurts with ambulation worse with pressure.  She started get shooting pain as well.  Hurts with taking for step in the morning.  She has not seen MRIs prior to seeing me right side is worse than left side.   Review of Systems: Negative except as noted in the HPI. Denies N/V/F/Ch.  Past Medical History:  Diagnosis Date   Adrenal adenoma    Allergy    Anemia    Anxiety    CAD (coronary artery disease)    a. 50-60% mid LAD at cardiac catheterization 2014 [facility]) b. non-obstructive CAD by cath in 09/2018   Cataract    Depression    DJD (degenerative joint disease) of cervical spine 09/12/2016   Dysrhythmia    H. pylori infection 07/19/2017   History of cardiomyopathy    History of COVID-19 12/10/2019   History of tobacco use    Hyperlipidemia    Hypothyroidism 08/24/2022   Mid back pain 02/08/2017   Neuromuscular disorder (HCC)    PAF (paroxysmal atrial fibrillation) ([place])    Diagnosis July 2017 - spontaneously resolved   Smell disturbance 06/13/2021   Statin intolerance     Current Outpatient Medications:    Biotin 10000 MCG TABS, Take 10,000 mcg by mouth daily at 12 noon., Disp: , Rfl:    buPROPion (WELLBUTRIN SR) 150 MG 12 hr tablet, TAKE ONE TABLET BY MOUTH TWICE A DAY, Disp: 180 tablet, Rfl: 1   Cholecalciferol (VITAMIN D3) 125 MCG (5000 UT) TABS, Take 5,000 Units by mouth daily at 12 noon., Disp: , Rfl:    Coenzyme Q10 100 MG capsule, Take 100 mg by mouth daily at 12 noon., Disp: , Rfl:    diazepam (VALIUM) 5 MG tablet, Take 1 tablet (5 mg total) by mouth daily as needed for anxiety or muscle spasms.,  Disp: 30 tablet, Rfl: 0   diclofenac Sodium (VOLTAREN) 1 % GEL, Apply 4 g topically 4 (four) times daily., Disp: 150 g, Rfl: 0   docusate sodium (COLACE) 100 MG capsule, Take 100 mg by mouth 2 (two) times daily. , Disp: , Rfl:    estradiol (ESTRACE) 2 MG tablet, Take 1 tablet (2 mg total) by mouth daily. (Patient taking differently: Take 3 mg by mouth daily.), Disp: 90 tablet, Rfl: 1   nitroGLYCERIN (NITROSTAT) 0.4 MG SL tablet, Place 1 tablet under the tongue every 5 minutes as needed for chest pain., Disp: 25 tablet, Rfl: 1   oxyCODONE-acetaminophen (PERCOCET/ROXICET) 5-325 MG tablet, TAKE 1 TABLET DAILY AS NEEDED FOR SEVERE PAIN., Disp: 30 tablet, Rfl: 0   progesterone (PROMETRIUM) 200 MG capsule, Take 1 capsule (200 mg total) by mouth daily., Disp: 90 capsule, Rfl: 1   thyroid (NP THYROID) 30 MG tablet, Take 1 tablet (30 mg total) by mouth daily before breakfast., Disp: 90 tablet, Rfl: 0   warfarin (COUMADIN) 5 MG tablet, Take 1 1/2 tablets daily except 1 tablet on Tuesdays and Fridays or as directed., Disp: 135 tablet, Rfl: 1  Social History   Tobacco Use  Smoking Status Former   Packs/day: 1.00  Years: 40.00   Total pack years: 40.00   Types: Cigarettes   Start date: 11/12/1965   Quit date: 09/06/2008   Years since quitting: 14.1  Smokeless Tobacco Never    Allergies  Allergen Reactions   Statins Other (See Comments)    Muscle weakness   Other     EKG pads causes blisters    Sulfa Antibiotics Rash   Objective:  There were no vitals filed for this visit. There is no height or weight on file to calculate BMI. Constitutional Well developed. Well nourished.  Vascular Dorsalis pedis pulses palpable bilaterally. Posterior tibial pulses palpable bilaterally. Capillary refill normal to all digits.  No cyanosis or clubbing noted. Pedal hair growth normal.  Neurologic Normal speech. Oriented to person, place, and time. Epicritic sensation to light touch grossly present  bilaterally.  Dermatologic Nails well groomed and normal in appearance. No open wounds. No skin lesions.  Orthopedic: Normal joint ROM without pain or crepitus bilaterally. No visible deformities. Tender to palpation at the calcaneal tuber bilaterally. No pain with calcaneal squeeze bilaterally. Ankle ROM diminished range of motion bilaterally. Silfverskiold Test: positive bilaterally.   Radiographs: None  Assessment:   1. Plantar fasciitis of right foot   2. Plantar fasciitis, left    Plan:  Patient was evaluated and treated and all questions answered.  Plantar Fasciitis, bilaterally - XR reviewed as above.  - Educated on icing and stretching. Instructions given.  - Injection delivered to the plantar fascia as below. - DME: Plantar fascial brace dispensed to support the medial longitudinal arch of the foot and offload pressure from the heel and prevent arch collapse during weightbearing - Pharmacologic management: None  Procedure: Injection Tendon/Ligament Location: Bilateral plantar fascia at the glabrous junction; medial approach. Skin Prep: alcohol Injectate: 0.5 cc 0.5% marcaine plain, 0.5 cc of 1% Lidocaine, 0.5 cc kenalog 10. Disposition: Patient tolerated procedure well. Injection site dressed with a band-aid.  No follow-ups on file.

## 2022-10-22 DIAGNOSIS — E039 Hypothyroidism, unspecified: Secondary | ICD-10-CM | POA: Diagnosis not present

## 2022-10-22 DIAGNOSIS — N951 Menopausal and female climacteric states: Secondary | ICD-10-CM | POA: Diagnosis not present

## 2022-10-25 DIAGNOSIS — F329 Major depressive disorder, single episode, unspecified: Secondary | ICD-10-CM | POA: Diagnosis not present

## 2022-10-25 DIAGNOSIS — N951 Menopausal and female climacteric states: Secondary | ICD-10-CM | POA: Diagnosis not present

## 2022-10-25 DIAGNOSIS — M2550 Pain in unspecified joint: Secondary | ICD-10-CM | POA: Diagnosis not present

## 2022-10-25 DIAGNOSIS — Z6826 Body mass index (BMI) 26.0-26.9, adult: Secondary | ICD-10-CM | POA: Diagnosis not present

## 2022-10-30 ENCOUNTER — Encounter: Payer: Self-pay | Admitting: Internal Medicine

## 2022-11-07 ENCOUNTER — Encounter: Payer: Self-pay | Admitting: Nurse Practitioner

## 2022-11-07 ENCOUNTER — Ambulatory Visit (INDEPENDENT_AMBULATORY_CARE_PROVIDER_SITE_OTHER): Payer: Medicare Other | Admitting: Nurse Practitioner

## 2022-11-07 ENCOUNTER — Ambulatory Visit: Payer: Medicare Other | Attending: Cardiology | Admitting: *Deleted

## 2022-11-07 VITALS — BP 130/82 | HR 60 | Ht 64.0 in

## 2022-11-07 DIAGNOSIS — I10 Essential (primary) hypertension: Secondary | ICD-10-CM

## 2022-11-07 DIAGNOSIS — R0609 Other forms of dyspnea: Secondary | ICD-10-CM

## 2022-11-07 DIAGNOSIS — I2510 Atherosclerotic heart disease of native coronary artery without angina pectoris: Secondary | ICD-10-CM | POA: Insufficient documentation

## 2022-11-07 DIAGNOSIS — Z5181 Encounter for therapeutic drug level monitoring: Secondary | ICD-10-CM | POA: Diagnosis not present

## 2022-11-07 DIAGNOSIS — Z7901 Long term (current) use of anticoagulants: Secondary | ICD-10-CM

## 2022-11-07 DIAGNOSIS — I480 Paroxysmal atrial fibrillation: Secondary | ICD-10-CM

## 2022-11-07 DIAGNOSIS — E785 Hyperlipidemia, unspecified: Secondary | ICD-10-CM | POA: Diagnosis not present

## 2022-11-07 LAB — POCT INR: INR: 3.2 — AB (ref 2.0–3.0)

## 2022-11-07 MED ORDER — WARFARIN SODIUM 5 MG PO TABS: ORAL_TABLET | ORAL | 1 refills | Status: DC

## 2022-11-07 NOTE — Patient Instructions (Signed)
Medication Instructions:  Your physician recommends that you continue on your current medications as directed. Please refer to the Current Medication list given to you today.   Labwork: None today  Testing/Procedures: None today  Follow-Up: 6-7 months with Dr.McDowell  Any Other Special Instructions Will Be Listed Below (If Applicable).  If you need a refill on your cardiac medications before your next appointment, please call your pharmacy.

## 2022-11-07 NOTE — Patient Instructions (Signed)
Take warfarin 1 tablet tonight then decrease dose to 1 tablet daily except 1 1/2 tablets on Mondays, Wednesdays and Fridays Recheck INR in 6 wks

## 2022-11-07 NOTE — Progress Notes (Unsigned)
Cardiology Office Note:    Date:  11/07/2022  ID:  Heather [name], DOB 1946/10/15, MRN [id number]  PCP:  Heather Spar, MD   [place] Providers Cardiologist:  Heather Lesches, MD     Referring MD: Heather Spar, MD   CC: Here for follow-up  History of Present Illness:    Heather [name] is a 76 y.o. female with a hx of the following:  CAD HLD (hx of statin intolerance) Takotsubo CM PAF (on Coumadin) PAC's Depression, Anxiety Tobacco use Adrenal adenoma  Heart catheterizations in 2019 and 2022 revealed nonobstructive CAD.  2D echocardiogram in July 2022 revealed normal EF, grade 1 DD.  Patient is a very pleasant 76 year old female with past medical history as mentioned above.    Last seen by Heather Copa, PA-C on May 08, 2022 via telemedicine visit for cardiovascular risk assessment for pending colonoscopy.  She reported doing well.  She stated she would periodically get short-lived breakthrough AF episodes, brief, stable over time.  Was consistent about following up at Coumadin clinic in [place], [place].  Today she presents for follow-up.  She states she is doing well. Denies any chest pain. Only notes shortness of breath when going upstairs, attributes this to weight and lack of exercise. Does note occasional A-fib episodes, not bothersome per her report. She is going to [facility] in [place] for her hormone therapy and will be seeing Lipid Clinic next year. Does admit to [facility]. Denies any syncope, presyncope, dizziness, orthopnea, PND, swelling, significant weight changes, acute bleeding, or claudication.  She is closely followed at Coumadin clinic.  Denies any other questions or concerns today.  Past Medical History:  Diagnosis Date   Adrenal adenoma    Allergy    Anemia    Anxiety    CAD (coronary artery disease)    a. 50-60% mid LAD at cardiac catheterization 2014 [facility]) b. non-obstructive CAD by cath in 09/2018   Cataract    Depression     DJD (degenerative joint disease) of cervical spine 09/12/2016   Dysrhythmia    H. pylori infection 07/19/2017   History of cardiomyopathy    History of COVID-19 12/10/2019   History of tobacco use    Hyperlipidemia    Hypothyroidism 08/24/2022   Mid back pain 02/08/2017   Neuromuscular disorder (HCC)    PAF (paroxysmal atrial fibrillation) ([place])    Diagnosis July 2017 - spontaneously resolved   Smell disturbance 06/13/2021   Statin intolerance     Past Surgical History:  Procedure Laterality Date   ABDOMINAL HYSTERECTOMY  1980   endometriosis   BIOPSY  07/18/2021   Procedure: BIOPSY;  Surgeon: Heather Quale, MD;  [facility];  Service: Gastroenterology;;   CATARACT EXTRACTION Left 11/2018   COLONOSCOPY WITH PROPOFOL N/A 06/27/2022   Procedure: COLONOSCOPY WITH PROPOFOL;  Surgeon: Heather Quale, MD;  [facility];  Service: Gastroenterology;  Laterality: N/A;  11:15   ESOPHAGOGASTRODUODENOSCOPY (EGD) WITH PROPOFOL N/A 07/18/2021   Procedure: ESOPHAGOGASTRODUODENOSCOPY (EGD) WITH PROPOFOL;  Surgeon: Heather Quale, MD;  [facility];  Service: Gastroenterology;  Laterality: N/A;  12:00   EYE SURGERY  cataracts  2021/2022   HEMOSTASIS CLIP PLACEMENT  06/27/2022   Procedure: HEMOSTASIS CLIP PLACEMENT;  Surgeon: Heather Quale, MD;  [facility];  Service: Gastroenterology;;   INTRAVASCULAR PRESSURE WIRE/FFR STUDY N/A 06/01/2021   Procedure: INTRAVASCULAR PRESSURE WIRE/FFR STUDY;  Surgeon: Heather Crome, MD;  Location: [facility]  INVASIVE CV LAB;  Service: Cardiovascular;  Laterality: N/A;   LEFT HEART CATH AND CORONARY ANGIOGRAPHY N/A 09/25/2018   Procedure: LEFT HEART CATH AND CORONARY ANGIOGRAPHY;  Surgeon: Heather Crome, MD;  Location: [place] CV LAB;  Service: Cardiovascular;  Laterality: N/A;   LEFT HEART CATH AND CORONARY ANGIOGRAPHY N/A 06/01/2021   Procedure: LEFT HEART CATH AND CORONARY  ANGIOGRAPHY;  Surgeon: Heather Crome, MD;  Location: [place] CV LAB;  Service: Cardiovascular;  Laterality: N/A;   POLYPECTOMY  06/27/2022   Procedure: POLYPECTOMY;  Surgeon: Heather Quale, MD;  [facility];  Service: Gastroenterology;;   Heather [name] DILATION  07/18/2021   Procedure: Heather [name] DILATION;  Surgeon: Heather [name], Heather Quince, MD;  [facility];  Service: Gastroenterology;;   SUBMUCOSAL LIFTING INJECTION  06/27/2022   Procedure: SUBMUCOSAL LIFTING INJECTION;  Surgeon: Heather Quale, MD;  [facility];  Service: Gastroenterology;;   SUBMUCOSAL TATTOO INJECTION  06/27/2022   Procedure: SUBMUCOSAL TATTOO INJECTION;  Surgeon: Heather [name], Heather Quince, MD;  [facility];  Service: Gastroenterology;;   TONSILLECTOMY  1954    Current Medications: Current Meds  Medication Sig   Biotin 10000 MCG TABS Take 10,000 mcg by mouth daily at 12 noon.   buPROPion (WELLBUTRIN SR) 150 MG 12 hr tablet TAKE ONE TABLET BY MOUTH TWICE A DAY   Cholecalciferol (VITAMIN D3) 125 MCG (5000 UT) TABS Take 5,000 Units by mouth daily at 12 noon.   Coenzyme Q10 100 MG capsule Take 100 mg by mouth daily at 12 noon.   diazepam (VALIUM) 5 MG tablet Take 1 tablet (5 mg total) by mouth daily as needed for anxiety or muscle spasms.   docusate sodium (COLACE) 100 MG capsule Take 100 mg by mouth 2 (two) times daily.    estradiol (ESTRACE) 2 MG tablet Take 1 tablet (2 mg total) by mouth daily. (Patient taking differently: Take 3 mg by mouth daily.)   nitroGLYCERIN (NITROSTAT) 0.4 MG SL tablet Place 1 tablet under the tongue every 5 minutes as needed for chest pain.   oxyCODONE-acetaminophen (PERCOCET/ROXICET) 5-325 MG tablet TAKE 1 TABLET DAILY AS NEEDED FOR SEVERE PAIN.   progesterone (PROMETRIUM) 200 MG capsule Take 1 capsule (200 mg total) by mouth daily.   thyroid (NP THYROID) 30 MG tablet Take 1 tablet (30 mg total) by mouth daily before breakfast.      Allergies:   Statins, Other, and Sulfa antibiotics   Social History   Socioeconomic History   Marital status: Married    Spouse name: Heather [name]   Number of children: 0   Years of education: 18   Highest education level: Not on file  Occupational History   Occupation: Probation officer    Comment: from home  Tobacco Use   Smoking status: Former    Packs/day: 1.00    Years: 40.00    Total pack years: 40.00    Types: Cigarettes    Start date: 11/12/1965    Quit date: 09/06/2008    Years since quitting: 14.1   Smokeless tobacco: Never  Vaping Use   Vaping Use: Never used  Substance and Sexual Activity   Alcohol use: Yes    Alcohol/week: 2.0 standard drinks of alcohol    Types: 1 Glasses of wine, 1 Shots of liquor per week    Comment: occasional   Drug use: Yes    Types: Hydrocodone, Marijuana    Comment: prescribed Percocet for myofacial pain syndrome   Sexual activity: Yes  Birth control/protection: Post-menopausal  Other Topics Concern   Not on file  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   Lives with husband Heather [name]   No children   Healthy diet and lifestyle   Social Determinants of Health   Financial Resource Strain: Low Risk  (09/13/2022)   Overall Financial Resource Strain (CARDIA)    Difficulty of Paying Living Expenses: Not very hard  Food Insecurity: No Food Insecurity (09/13/2022)   Hunger Vital Sign    Worried About Running Out of Food in the Last Year: Never true    Ran Out of Food in the Last Year: Never true  Transportation Needs: No Transportation Needs (09/13/2022)   PRAPARE - Hydrologist (Medical): No    Lack of Transportation (Non-Medical): No  Physical Activity: Insufficiently Active (09/13/2022)   Exercise Vital Sign    Days of Exercise per Week: 2 days    Minutes of Exercise per Session: 30 min  Stress: No Stress Concern Present (09/13/2022)   [place]     Feeling of Stress : Not at all  Social Connections: Unknown (09/13/2022)   Social Connection and Isolation Panel [NHANES]    Frequency of Communication with Friends and Family: More than three times a week    Frequency of Social Gatherings with Friends and Family: Three times a week    Attends Religious Services: Not on file    Active Member of Clubs or Organizations: Yes    Attends Club or Organization Meetings: 1 to 4 times per year    Marital Status: Married     Family History: The patient's family history includes Arthritis in her father and sister; COPD in her mother; Cancer - Prostate in her brother; Depression in her sister; Hearing loss in her mother; Heart disease in her father, mother, and sister; Hyperlipidemia in her brother; Hypertension in her brother and father; Stroke in her mother; Varicose Veins in her mother; Vision loss in her father and mother.  ROS:   Review of Systems  Constitutional: Negative.   HENT: Negative.    Eyes: Negative.   Respiratory:  Positive for shortness of breath. Negative for cough, hemoptysis, sputum production and wheezing.        See HPI.   Cardiovascular:  Positive for palpitations. Negative for chest pain, orthopnea, claudication, leg swelling and PND.       See HPI.   Gastrointestinal: Negative.   Genitourinary: Negative.   Musculoskeletal: Negative.   Skin: Negative.   Neurological: Negative.   Endo/Heme/Allergies: Negative.   Psychiatric/Behavioral: Negative.      Please see the history of present illness.     All other systems reviewed and are negative.  EKGs/Labs/Other Studies Reviewed:    The following studies were reviewed today:   EKG:  EKG is ordered today.  The ekg ordered today demonstrates normal sinus rhythm, 60 bpm, with nonspecific ST segment changes, otherwise nothing acute.  ABI's on 10/13/2022: Right: Resting right ABI indicates mild right lower extremity arterial disease.  Right TBI is abnormal.  Left: Resting  left ABI is within normal range.  Left TBI is abnormal.  Cardiac monitor on 06/25/2021: Predominant rhythm is sinus with prolonged PR interval. Heart rate ranged from 49 bpm up to 99 bpm with average heart rate 62 bpm. There were rare PACs including couplets and triplets representing less than 1% total beats. There were rare PVCs representing less than 1% total beats.  No significant arrhythmias or pauses.  Left heart cath on June 01, 2021: Left main widely patent LAD with 20% ostial narrowing and mid vessel 25 to 30% narrowing.  A diagonal arises from the mid vessel and supplies more territory than LAD which stops before the LV apex. Circumflex gives several small branches and is widely patent Right coronary is dominant and widely patent LVEDP 12 mmHg.  EF 55%. Invasive hemodynamic coronary assessment revealed: CFR 5.0 (normal greater than 2.0); FFR in LAD territory 0.92 (normal greater than 0.8); and IMR (index of microvascular resistance) 11 (normal less than 25).   RECOMMENDATIONS:   Suspect the abnormal perfusion on technetium imaging represents residual scar from episode of Takotsubo 10 years ago. Unable to identify any significant coronary flow abnormalities.  Have still not excluded the possibility of coronary spasm which was not tested for today. This is now her third catheterization with essentially widely patent coronaries.  No microvascular dysfunction has been identified.  The stenoses in the LAD are not hemodynamically significant.  Attention should be turned to other explanations for the atypical dyspnea and arm and leg numbness that she complains of.  Myoview on May 22, 2021: Blood pressure demonstrated a normal response to exercise. ST segment depression was noted during stress .Baseline 19m inferior ST depression increased to 338mhorizontal depression with stress Cannot calculate Duke treadmill score as protocol was held at stage II due to dyspnea Findings consistent with  prior septal myocardial infarction with mild to moderate peri-infarct ischemia. The left ventricular ejection fraction is normal (55-65%). Low to intermediate risk study  2D echocardiogram on May 22, 2021: 1. Left ventricular ejection fraction, by estimation, is 55 to 60%. The  left ventricle has normal function. The left ventricle has no regional  wall motion abnormalities. Left ventricular diastolic parameters are  consistent with Grade I diastolic  dysfunction (impaired relaxation).   2. Right ventricular systolic function is normal. The right ventricular  size is normal. Tricuspid regurgitation signal is inadequate for assessing  PA pressure.   3. The mitral valve is normal in structure. No evidence of mitral valve  regurgitation. No evidence of mitral stenosis.   4. The aortic valve is tricuspid. Aortic valve regurgitation is not  visualized. No aortic stenosis is present.   5. The inferior vena cava is normal in size with greater than 50%  respiratory variability, suggesting right atrial pressure of 3 mmHg.  Recent Labs: 08/23/2022: BUN 24; Creatinine, Ser 0.82; Hemoglobin 11.8; Platelets 230; Potassium 4.8; Sodium 142; TSH 8.270  Recent Lipid Panel    Component Value Date/Time   CHOL 366 (H) 08/23/2022 0905   TRIG 249 (H) 08/23/2022 0905   HDL 53 08/23/2022 0905   CHOLHDL 6.9 (H) 08/23/2022 0905   CHOLHDL 4.6 04/05/2021 1414   LDLCALC 260 (H) 08/23/2022 0905   LDLCALC 169 (H) 04/05/2021 1414     Risk Assessment/Calculations:    CHA2DS2-VASc Score = 4  This indicates a 4.8% annual risk of stroke. The patient's score is based upon: CHF History: 1 HTN History: 0 Diabetes History: 0 Stroke History: 0 Vascular Disease History: 0 Age Score: 2 Gender Score: 1    Physical Exam:    VS:  BP 130/82 (BP Location: Left Arm, Patient Position: Sitting, Cuff Size: Normal)   Pulse 60   Ht '5\' 4"'$  (1.626 m)   LMP 07/08/1979 (Approximate)   BMI 27.12 kg/m     Wt Readings  from Last 3 Encounters:  10/08/22 158 lb (71.7  kg)  06/27/22 158 lb (71.7 kg)  05/03/22 155 lb (70.3 kg)     GEN: Well nourished, well developed in no acute distress HEENT: Normal NECK: No JVD; No carotid bruits CARDIAC: S1/S2, RRR, no murmurs, rubs, gallops; 2+ peripheral pulses, strong and equal bilaterally RESPIRATORY:  Clear to auscultation without rales, wheezing or rhonchi  MUSCULOSKELETAL:  No edema; No deformity  SKIN: Warm and dry NEUROLOGIC:  Alert and oriented x 3 PSYCHIATRIC:  Normal affect   ASSESSMENT:    1. PAF (paroxysmal atrial fibrillation) ([place])   2. Current use of long term anticoagulation   3. Coronary artery disease involving native heart without angina pectoris, unspecified vessel or lesion type   4. Hyperlipidemia, unspecified hyperlipidemia type   5. Hypertension, unspecified type   6. DOE (dyspnea on exertion)    PLAN:    In order of problems listed above:  PAF, on long term anticoagulation Does admit to occasional palpitations, not bothersome per her report, transient in nature. She is in NSR on exam today. Continue current medication regimen. Denies any bleeding issues while on Coumadin. Continue to follow up at Coumadin Clinic. Heart healthy diet and regular cardiovascular exercise encouraged.   CAD Cardiac cath in 2022 revealed mild, nonobstructive CAD. Continue current medication regimen. Heart healthy diet and regular cardiovascular exercise encouraged.   HLD Lipid profile in 08/2022 revealed total cholesterol 366, LDL 260, HDL 53, triglycerides 249. Has history of statin intolerance. Was previously referred to [facility], has upcoming appt next year. Heart healthy diet and regular cardiovascular exercise encouraged.   HTN BP on arrival, 142/64. Repeat BP 130/82. Does admit to [facility]. Discussed to monitor BP at home at least 2 hours after medications and sitting for 5-10 minutes.  Continue current medication regimen.  Given BP log.  Discussed to  notify office if SBP is consistently greater than 140. Heart healthy diet and regular cardiovascular exercise encouraged.   DOE Does admit to symptoms noticed with walking upstairs.  She attributes this to weight and lack of physical exercise. Echo in 2022 overall unremarkable.  Discussed that we will continue to monitor this for now and may consider repeating Echo if symptoms were to worsen.  Continue current medication regimen. Heart healthy diet and regular cardiovascular exercise encouraged.   Disposition: Follow-up with Dr. Domenic Polite in 6-7 months or sooner if anything changes.   Medication Adjustments/Labs and Tests Ordered: Current medicines are reviewed at length with the patient today.  Concerns regarding medicines are outlined above.  No orders of the defined types were placed in this encounter.  No orders of the defined types were placed in this encounter.   Patient Instructions  Medication Instructions:  Your physician recommends that you continue on your current medications as directed. Please refer to the Current Medication list given to you today.   Labwork: None today  Testing/Procedures: None today  Follow-Up: 6-7 months with Dr.McDowell  Any Other Special Instructions Will Be Listed Below (If Applicable).  If you need a refill on your cardiac medications before your next appointment, please call your pharmacy.    Signed, Finis Bud, NP  11/08/2022 8:08 PM    [place]

## 2022-11-08 ENCOUNTER — Encounter: Payer: Self-pay | Admitting: Nurse Practitioner

## 2022-11-09 NOTE — Addendum Note (Signed)
Addended by: Merlene Laughter on: 11/09/2022 11:12 AM   Modules accepted: Orders

## 2022-11-16 ENCOUNTER — Ambulatory Visit (INDEPENDENT_AMBULATORY_CARE_PROVIDER_SITE_OTHER): Payer: Medicare Other | Admitting: Podiatry

## 2022-11-16 VITALS — BP 124/68

## 2022-11-16 DIAGNOSIS — M722 Plantar fascial fibromatosis: Secondary | ICD-10-CM | POA: Diagnosis not present

## 2022-11-16 NOTE — Progress Notes (Signed)
Subjective:  Patient ID: Heather [name], female    DOB: June 30, 1946,  MRN: [id number]  Chief Complaint  Patient presents with   Plantar Fasciitis    Pt stated that she is doing better but at the end of the day she does have some soreness     77 y.o. female presents with the above complaint.  Patient presents for follow-up of bilateral Planter fasciitis.  She states she is doing better.  The injection helped some.  But she still has some residual/soreness.  She would like to discuss next treatment plan   Review of Systems: Negative except as noted in the HPI. Denies N/V/F/Ch.  Past Medical History:  Diagnosis Date   Adrenal adenoma    Allergy    Anemia    Anxiety    CAD (coronary artery disease)    a. 50-60% mid LAD at cardiac catheterization 2014 [facility]) b. non-obstructive CAD by cath in 09/2018   Cataract    Depression    DJD (degenerative joint disease) of cervical spine 09/12/2016   Dysrhythmia    H. pylori infection 07/19/2017   History of cardiomyopathy    History of COVID-19 12/10/2019   History of tobacco use    Hyperlipidemia    Hypothyroidism 08/24/2022   Mid back pain 02/08/2017   Neuromuscular disorder (HCC)    PAF (paroxysmal atrial fibrillation) ([place])    Diagnosis July 2017 - spontaneously resolved   Smell disturbance 06/13/2021   Statin intolerance     Current Outpatient Medications:    Biotin 10000 MCG TABS, Take 10,000 mcg by mouth daily at 12 noon., Disp: , Rfl:    buPROPion (WELLBUTRIN SR) 150 MG 12 hr tablet, TAKE ONE TABLET BY MOUTH TWICE A DAY, Disp: 180 tablet, Rfl: 1   Cholecalciferol (VITAMIN D3) 125 MCG (5000 UT) TABS, Take 5,000 Units by mouth daily at 12 noon., Disp: , Rfl:    Coenzyme Q10 100 MG capsule, Take 100 mg by mouth daily at 12 noon., Disp: , Rfl:    diazepam (VALIUM) 5 MG tablet, Take 1 tablet (5 mg total) by mouth daily as needed for anxiety or muscle spasms., Disp: 30 tablet, Rfl: 0   diclofenac Sodium (VOLTAREN) 1 % GEL,  Apply 4 g topically 4 (four) times daily. (Patient not taking: Reported on 11/07/2022), Disp: 150 g, Rfl: 0   docusate sodium (COLACE) 100 MG capsule, Take 100 mg by mouth 2 (two) times daily. , Disp: , Rfl:    estradiol (ESTRACE) 2 MG tablet, Take 1 tablet (2 mg total) by mouth daily. (Patient taking differently: Take 3 mg by mouth daily.), Disp: 90 tablet, Rfl: 1   nitroGLYCERIN (NITROSTAT) 0.4 MG SL tablet, Place 1 tablet under the tongue every 5 minutes as needed for chest pain., Disp: 25 tablet, Rfl: 1   oxyCODONE-acetaminophen (PERCOCET/ROXICET) 5-325 MG tablet, TAKE 1 TABLET DAILY AS NEEDED FOR SEVERE PAIN., Disp: 30 tablet, Rfl: 0   progesterone (PROMETRIUM) 200 MG capsule, Take 1 capsule (200 mg total) by mouth daily., Disp: 90 capsule, Rfl: 1   thyroid (NP THYROID) 30 MG tablet, Take 1 tablet (30 mg total) by mouth daily before breakfast., Disp: 90 tablet, Rfl: 0   warfarin (COUMADIN) 5 MG tablet, Take 1 tablet to 1 1/2 tablets daily as directed by coumadin clinic, Disp: 135 tablet, Rfl: 1  Social History   Tobacco Use  Smoking Status Former   Packs/day: 1.00   Years: 40.00   Total pack years: 40.00   Types: Cigarettes  Start date: 11/12/1965   Quit date: 09/06/2008   Years since quitting: 14.2  Smokeless Tobacco Never    Allergies  Allergen Reactions   Statins Other (See Comments)    Muscle weakness   Other     EKG pads causes blisters    Sulfa Antibiotics Rash   Objective:   Vitals:   11/16/22 1117  BP: 124/68   There is no height or weight on file to calculate BMI. Constitutional Well developed. Well nourished.  Vascular Dorsalis pedis pulses palpable bilaterally. Posterior tibial pulses palpable bilaterally. Capillary refill normal to all digits.  No cyanosis or clubbing noted. Pedal hair growth normal.  Neurologic Normal speech. Oriented to person, place, and time. Epicritic sensation to light touch grossly present bilaterally.  Dermatologic Nails well  groomed and normal in appearance. No open wounds. No skin lesions.  Orthopedic: Normal joint ROM without pain or crepitus bilaterally. No visible deformities. Tender to palpation at the calcaneal tuber bilaterally. No pain with calcaneal squeeze bilaterally. Ankle ROM diminished range of motion bilaterally. Silfverskiold Test: positive bilaterally.   Radiographs: None  Assessment:   No diagnosis found.  Plan:  Patient was evaluated and treated and all questions answered.  Plantar Fasciitis, bilaterally - XR reviewed as above.  - Educated on icing and stretching. Instructions given.  -Second injection delivered to the plantar fascia as below. - DME: Continue wearing plantar fascial brace.  Bilateral night splint was dispensed. - Pharmacologic management: None  Procedure: Injection Tendon/Ligament Location: Bilateral plantar fascia at the glabrous junction; medial approach. Skin Prep: alcohol Injectate: 0.5 cc 0.5% marcaine plain, 0.5 cc of 1% Lidocaine, 0.5 cc kenalog 10. Disposition: Patient tolerated procedure well. Injection site dressed with a band-aid.  No follow-ups on file.

## 2022-11-17 ENCOUNTER — Encounter: Payer: Self-pay | Admitting: Podiatry

## 2022-11-18 ENCOUNTER — Other Ambulatory Visit: Payer: Self-pay | Admitting: Neurology

## 2022-11-19 NOTE — Telephone Encounter (Signed)
Please advise 

## 2022-11-20 MED ORDER — OXYCODONE-ACETAMINOPHEN 5-325 MG PO TABS: ORAL_TABLET | ORAL | 0 refills | Status: DC

## 2022-11-26 ENCOUNTER — Ambulatory Visit: Payer: Medicare Other | Admitting: Family Medicine

## 2022-11-26 ENCOUNTER — Ambulatory Visit (INDEPENDENT_AMBULATORY_CARE_PROVIDER_SITE_OTHER): Payer: Medicare Other | Admitting: Internal Medicine

## 2022-11-26 ENCOUNTER — Encounter: Payer: Self-pay | Admitting: Internal Medicine

## 2022-11-26 VITALS — BP 138/77 | HR 61 | Ht 64.0 in | Wt 160.0 lb

## 2022-11-26 DIAGNOSIS — Z789 Other specified health status: Secondary | ICD-10-CM

## 2022-11-26 DIAGNOSIS — E039 Hypothyroidism, unspecified: Secondary | ICD-10-CM | POA: Diagnosis not present

## 2022-11-26 DIAGNOSIS — I480 Paroxysmal atrial fibrillation: Secondary | ICD-10-CM

## 2022-11-26 DIAGNOSIS — I2510 Atherosclerotic heart disease of native coronary artery without angina pectoris: Secondary | ICD-10-CM | POA: Diagnosis not present

## 2022-11-26 DIAGNOSIS — F411 Generalized anxiety disorder: Secondary | ICD-10-CM | POA: Diagnosis not present

## 2022-11-26 MED ORDER — THYROID 30 MG PO TABS: ORAL_TABLET | ORAL | 0 refills | Status: DC

## 2022-11-26 NOTE — Patient Instructions (Signed)
Please start taking NP thyroid as prescribed.  Please stop taking Biotin supplement for now.  Please continue taking other medications as prescribed.  Please continue to follow low salt diet and ambulate as tolerated.  Please get blood tests done after 2 months.

## 2022-11-27 ENCOUNTER — Encounter: Payer: Self-pay | Admitting: Internal Medicine

## 2022-11-27 ENCOUNTER — Encounter: Payer: Self-pay | Admitting: Neurology

## 2022-11-30 NOTE — Assessment & Plan Note (Addendum)
Well-controlled with PRN Diazepam

## 2022-11-30 NOTE — Assessment & Plan Note (Signed)
Has history of HLD, did not tolerate 3 different statins -had leg cramps/myositis

## 2022-11-30 NOTE — Assessment & Plan Note (Signed)
On Coumadin Not on any rate controlling agents Needs to follow up with cardiology, may need beta-blocker for rate control

## 2022-11-30 NOTE — Assessment & Plan Note (Signed)
S/p stent placement Has history of Takotsubo cardiomyopathy Followed by cardiology On Coumadin for A. fib, did not tolerate statin in the past

## 2022-11-30 NOTE — Assessment & Plan Note (Signed)
Lab Results  Component Value Date   TSH 8.270 (H) 08/23/2022    Did not tolerate levothyroxine On NP thyroid 30 mg daily, increased dose to 60 mg MWF, 30 mg on TThSaS Recheck TSH and free T4 after 3 months

## 2022-11-30 NOTE — Progress Notes (Signed)
Established Patient Office Visit  Subjective:  Patient ID: Heather [name], female    DOB: 06/07/1946  Age: 77 y.o. MRN: [id number]  CC:  Chief Complaint  Patient presents with   Hypothyroidism    Patient following up for her thyroid levels after being on thyroid medication for three months    HPI Heather [name] is a 77 y.o. female with past medical history of CAD, PAF, HLD, myofascial pain syndrome and depression with anxiety who presents for f/u of her chronic medical conditions.  She has history of CAD s/p stent placement and paroxysmal atrial fibrillation.  She is on Coumadin currently.  She sees cardiologist for it.  She has a history of HLD, but did not tolerate 3 different statins in the past.    She had been taking NP thyroid for HLD despite her A. fib.  Her TSH was <0.01 in 12/2020. She prefers to take NP thyroid as she did not tolerate Levothyroxine. She has noticed weight gain and fatigue since the last visit, although weight was reported by her and not measured in the office per her request.  She also takes estradiol and progesterone currently. She follows up with hormone clinic for it.   She sees neurologist for myofascial pain syndrome, for which she gets Botox injections.   She is on Wellbutrin and as needed diazepam for depression with anxiety.  She denies any anhedonia, SI or HI.    Past Medical History:  Diagnosis Date   Adrenal adenoma    Allergy    Anemia    Anxiety    CAD (coronary artery disease)    a. 50-60% mid LAD at cardiac catheterization 2014 [facility]) b. non-obstructive CAD by cath in 09/2018   Cataract    Depression    DJD (degenerative joint disease) of cervical spine 09/12/2016   Dysrhythmia    H. pylori infection 07/19/2017   History of cardiomyopathy    History of COVID-19 12/10/2019   History of tobacco use    Hyperlipidemia    Hypothyroidism 08/24/2022   Mid back pain 02/08/2017   Neuromuscular disorder (HCC)    PAF (paroxysmal  atrial fibrillation) ([place])    Diagnosis July 2017 - spontaneously resolved   Smell disturbance 06/13/2021   Statin intolerance     Past Surgical History:  Procedure Laterality Date   ABDOMINAL HYSTERECTOMY  1980   endometriosis   BIOPSY  07/18/2021   Procedure: BIOPSY;  Surgeon: Harvel Quale, MD;  [facility];  Service: Gastroenterology;;   CATARACT EXTRACTION Left 11/2018   COLONOSCOPY WITH PROPOFOL N/A 06/27/2022   Procedure: COLONOSCOPY WITH PROPOFOL;  Surgeon: Harvel Quale, MD;  [facility];  Service: Gastroenterology;  Laterality: N/A;  11:15   ESOPHAGOGASTRODUODENOSCOPY (EGD) WITH PROPOFOL N/A 07/18/2021   Procedure: ESOPHAGOGASTRODUODENOSCOPY (EGD) WITH PROPOFOL;  Surgeon: Harvel Quale, MD;  [facility];  Service: Gastroenterology;  Laterality: N/A;  12:00   EYE SURGERY  cataracts  2021/2022   HEMOSTASIS CLIP PLACEMENT  06/27/2022   Procedure: HEMOSTASIS CLIP PLACEMENT;  Surgeon: Harvel Quale, MD;  [facility];  Service: Gastroenterology;;   INTRAVASCULAR PRESSURE WIRE/FFR STUDY N/A 06/01/2021   Procedure: INTRAVASCULAR PRESSURE WIRE/FFR STUDY;  Surgeon: Belva Crome, MD;  Location: [place] [facility];  Service: Cardiovascular;  Laterality: N/A;   LEFT HEART CATH AND CORONARY ANGIOGRAPHY N/A 09/25/2018   Procedure: LEFT HEART CATH AND CORONARY ANGIOGRAPHY;  Surgeon: Belva Crome, MD;  Location: [place]  [facility];  Service: Cardiovascular;  Laterality: N/A;   LEFT HEART CATH AND CORONARY ANGIOGRAPHY N/A 06/01/2021   Procedure: LEFT HEART CATH AND CORONARY ANGIOGRAPHY;  Surgeon: Belva Crome, MD;  Location: [place] [facility];  Service: Cardiovascular;  Laterality: N/A;   POLYPECTOMY  06/27/2022   Procedure: POLYPECTOMY;  Surgeon: Harvel Quale, MD;  [facility];  Service: Gastroenterology;;   Azzie Almas DILATION  07/18/2021   Procedure: Azzie Almas DILATION;   Surgeon: Montez Morita, Quillian Quince, MD;  [facility];  Service: Gastroenterology;;   SUBMUCOSAL LIFTING INJECTION  06/27/2022   Procedure: SUBMUCOSAL LIFTING INJECTION;  Surgeon: Montez Morita, Quillian Quince, MD;  [facility];  Service: Gastroenterology;;   SUBMUCOSAL TATTOO INJECTION  06/27/2022   Procedure: SUBMUCOSAL TATTOO INJECTION;  Surgeon: Montez Morita, Quillian Quince, MD;  [facility];  Service: Gastroenterology;;   TONSILLECTOMY  1954    Family History  Problem Relation Age of Onset   COPD Mother    Hearing loss Mother    Stroke Mother    Heart disease Mother        chf, died at 66   Vision loss Mother    Varicose Veins Mother    Arthritis Father    Hypertension Father    Heart disease Father        cad, pvd, died at 65   Vision loss Father    Heart disease Sister        heart valve   Arthritis Sister    Depression Sister    Hyperlipidemia Brother    Hypertension Brother    Cancer - Prostate Brother     Social History   Socioeconomic History   Marital status: Married    Spouse name: Al   Number of children: 0   Years of education: 18   Highest education level: Not on file  Occupational History   Occupation: Probation officer    Comment: from home  Tobacco Use   Smoking status: Former    Packs/day: 1.00    Years: 40.00    Total pack years: 40.00    Types: Cigarettes    Start date: 11/12/1965    Quit date: 09/06/2008    Years since quitting: 14.2   Smokeless tobacco: Never  Vaping Use   Vaping Use: Never used  Substance and Sexual Activity   Alcohol use: Yes    Alcohol/week: 2.0 standard drinks of alcohol    Types: 1 Glasses of wine, 1 Shots of liquor per week    Comment: occasional   Drug use: Yes    Types: Hydrocodone, Marijuana    Comment: prescribed Percocet for myofacial pain syndrome   Sexual activity: Yes    Birth control/protection: Post-menopausal  Other Topics Concern   Not on file  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   Lives with husband AL   No children   Healthy diet and lifestyle   Social Determinants of Health   Financial Resource Strain: Low Risk  (09/13/2022)   Overall Financial Resource Strain (CARDIA)    Difficulty of Paying Living Expenses: Not very hard  Food Insecurity: No Food Insecurity (09/13/2022)   Hunger Vital Sign    Worried About Running Out of Food in the Last Year: Never true    Ran Out of Food in the Last Year: Never true  Transportation Needs: No Transportation Needs (09/13/2022)   PRAPARE - Transportation    Lack of Transportation (Medical): No    Lack  of Transportation (Non-Medical): No  Physical Activity: Insufficiently Active (09/13/2022)   Exercise Vital Sign    Days of Exercise per Week: 2 days    Minutes of Exercise per Session: 30 min  Stress: No Stress Concern Present (09/13/2022)   [place]    Feeling of Stress : Not at all  Social Connections: Unknown (09/13/2022)   Social Connection and Isolation Panel [NHANES]    Frequency of Communication with Friends and Family: More than three times a week    Frequency of Social Gatherings with Friends and Family: Three times a week    Attends Religious Services: Not on file    Active Member of Clubs or Organizations: Yes    Attends Club or Organization Meetings: 1 to 4 times per year    Marital Status: Married  Intimate Partner Violence: Not At Risk (09/13/2021)   Humiliation, Afraid, Rape, and Kick questionnaire    Fear of Current or Ex-Partner: No    Emotionally Abused: No    Physically Abused: No    Sexually Abused: No    Outpatient Medications Prior to Visit  Medication Sig Dispense Refill   estradiol (ESTRACE) 1 MG tablet Take 1 mg by mouth daily.     buPROPion (WELLBUTRIN SR) 150 MG 12 hr tablet TAKE ONE TABLET BY MOUTH TWICE A DAY 180 tablet 1   Cholecalciferol (VITAMIN D3) 125 MCG (5000 UT) TABS Take 5,000 Units by mouth daily at 12  noon.     Coenzyme Q10 100 MG capsule Take 100 mg by mouth daily at 12 noon.     diazepam (VALIUM) 5 MG tablet Take 1 tablet (5 mg total) by mouth daily as needed for anxiety or muscle spasms. 30 tablet 0   diclofenac Sodium (VOLTAREN) 1 % GEL Apply 4 g topically 4 (four) times daily. (Patient not taking: Reported on 11/07/2022) 150 g 0   docusate sodium (COLACE) 100 MG capsule Take 100 mg by mouth 2 (two) times daily.      nitroGLYCERIN (NITROSTAT) 0.4 MG SL tablet Place 1 tablet under the tongue every 5 minutes as needed for chest pain. 25 tablet 1   oxyCODONE-acetaminophen (PERCOCET/ROXICET) 5-325 MG tablet TAKE 1 TABLET DAILY AS NEEDED FOR SEVERE PAIN. 30 tablet 0   progesterone (PROMETRIUM) 200 MG capsule Take 1 capsule (200 mg total) by mouth daily. 90 capsule 1   warfarin (COUMADIN) 5 MG tablet Take 1 tablet to 1 1/2 tablets daily as directed by coumadin clinic 135 tablet 1   Biotin 10000 MCG TABS Take 10,000 mcg by mouth daily at 12 noon.     estradiol (ESTRACE) 2 MG tablet Take 1 tablet (2 mg total) by mouth daily. (Patient taking differently: Take 3 mg by mouth daily.) 90 tablet 1   thyroid (NP THYROID) 30 MG tablet Take 1 tablet (30 mg total) by mouth daily before breakfast. 90 tablet 0   No facility-administered medications prior to visit.    Allergies  Allergen Reactions   Statins Other (See Comments)    Muscle weakness   Other     EKG pads causes blisters    Sulfa Antibiotics Rash    ROS Review of Systems  Constitutional:  Positive for fatigue. Negative for chills and fever.  HENT:  Negative for congestion, sinus pressure, sinus pain and sore throat.   Eyes:  Negative for pain and discharge.  Respiratory:  Negative for cough and shortness of breath.   Cardiovascular:  Negative  for chest pain and palpitations.  Gastrointestinal:  Negative for abdominal pain, diarrhea, nausea and vomiting.  Endocrine: Negative for polydipsia and polyuria.  Genitourinary:  Negative for  dysuria and hematuria.  Musculoskeletal:  Negative for neck pain and neck stiffness.  Skin:  Negative for rash.  Neurological:  Positive for numbness. Negative for dizziness and weakness.  Psychiatric/Behavioral:  Negative for agitation and behavioral problems. The patient is nervous/anxious.       Objective:    Physical Exam Vitals reviewed.  Constitutional:      General: She is not in acute distress.    Appearance: She is not diaphoretic.  HENT:     Head: Normocephalic and atraumatic.     Nose: Nose normal.     Mouth/Throat:     Mouth: Mucous membranes are moist.  Eyes:     General: No scleral icterus.    Extraocular Movements: Extraocular movements intact.  Cardiovascular:     Rate and Rhythm: Normal rate and regular rhythm.     Heart sounds: Normal heart sounds. No murmur heard. Pulmonary:     Breath sounds: Normal breath sounds. No wheezing or rales.  Musculoskeletal:     Cervical back: Neck supple. No tenderness.     Right lower leg: No edema.     Left lower leg: No edema.  Skin:    General: Skin is warm.     Findings: No rash.  Neurological:     General: No focal deficit present.     Mental Status: She is alert and oriented to person, place, and time.  Psychiatric:        Mood and Affect: Mood normal.        Behavior: Behavior normal.     BP 138/77 (BP Location: Right Arm, Patient Position: Sitting, Cuff Size: Normal)   Pulse 61   Ht '5\' 4"'$  (1.626 m)   Wt 160 lb (72.6 kg)   LMP 07/08/1979 (Approximate)   SpO2 99%   BMI 27.46 kg/m  Wt Readings from Last 3 Encounters:  11/26/22 160 lb (72.6 kg)  10/08/22 158 lb (71.7 kg)  06/27/22 158 lb (71.7 kg)    Lab Results  Component Value Date   TSH 8.270 (H) 08/23/2022   Lab Results  Component Value Date   WBC 5.8 08/23/2022   HGB 11.8 08/23/2022   HCT 35.6 08/23/2022   MCV 88 08/23/2022   PLT 230 08/23/2022   Lab Results  Component Value Date   NA 142 08/23/2022   K 4.8 08/23/2022   CO2 22  08/23/2022   GLUCOSE 96 08/23/2022   BUN 24 08/23/2022   CREATININE 0.82 08/23/2022   BILITOT 0.5 06/05/2020   ALKPHOS 55 06/05/2020   AST 21 06/05/2020   ALT 20 06/05/2020   PROT 7.4 06/05/2020   ALBUMIN 4.3 06/05/2020   CALCIUM 9.7 08/23/2022   ANIONGAP 6 05/15/2021   EGFR 74 08/23/2022   Lab Results  Component Value Date   CHOL 366 (H) 08/23/2022   Lab Results  Component Value Date   HDL 53 08/23/2022   Lab Results  Component Value Date   LDLCALC 260 (H) 08/23/2022   Lab Results  Component Value Date   TRIG 249 (H) 08/23/2022   Lab Results  Component Value Date   CHOLHDL 6.9 (H) 08/23/2022   No results found for: "HGBA1C"    Assessment & Plan:   Problem List Items Addressed This Visit       Cardiovascular and Mediastinum  CAD (coronary artery disease)    S/p stent placement Has history of Takotsubo cardiomyopathy Followed by cardiology On Coumadin for A. fib, did not tolerate statin in the past      PAF (paroxysmal atrial fibrillation) ([place]) - Primary    On Coumadin Not on any rate controlling agents Needs to follow up with cardiology, may need beta-blocker for rate control        Endocrine   Hypothyroidism    Lab Results  Component Value Date   TSH 8.270 (H) 08/23/2022   Did not tolerate levothyroxine On NP thyroid 30 mg daily, increased dose to 60 mg MWF, 30 mg on TThSaS Recheck TSH and free T4 after 3 months      Relevant Medications   thyroid (NP THYROID) 30 MG tablet   Other Relevant Orders   TSH + free T4     Other   GAD (generalized anxiety disorder)    Well-controlled with PRN Diazepam      Statin intolerance    Has history of HLD, did not tolerate 3 different statins -had leg cramps/myositis       Meds ordered this encounter  Medications   thyroid (NP THYROID) 30 MG tablet    Sig: Take 2 tablets once daily on Monday, Wednesday and Friday. Take 1 tablet once daily on other days.    Dispense:  90 tablet    Refill:  0     Follow-up: Return in about 3 months (around 02/25/2023) for Hypothyroidism.    Lindell Spar, MD

## 2022-12-05 ENCOUNTER — Telehealth: Payer: Self-pay | Admitting: Neurology

## 2022-12-05 NOTE — Telephone Encounter (Signed)
This is FYI for POD 1 pt has r/s her Botox to 02-26 due to work schedule conflict

## 2022-12-05 NOTE — Telephone Encounter (Signed)
Noted  

## 2022-12-11 ENCOUNTER — Ambulatory Visit: Payer: Medicare Other | Admitting: Neurology

## 2022-12-19 ENCOUNTER — Ambulatory Visit: Payer: Medicare Other | Attending: Cardiology | Admitting: *Deleted

## 2022-12-19 DIAGNOSIS — I480 Paroxysmal atrial fibrillation: Secondary | ICD-10-CM

## 2022-12-19 DIAGNOSIS — Z5181 Encounter for therapeutic drug level monitoring: Secondary | ICD-10-CM

## 2022-12-19 LAB — POCT INR: INR: 1.8 — AB (ref 2.0–3.0)

## 2022-12-19 NOTE — Patient Instructions (Signed)
Started Vegetarian diet Take warfarin 2 tablets tonight then increase dose to 1 1/2 tablets daily except 1 tablet on Sundays, Tuesdays and Thursdays Recheck INR in 6 wks.  Pt will be out of town in 4 wks

## 2022-12-26 ENCOUNTER — Other Ambulatory Visit (HOSPITAL_COMMUNITY): Payer: Self-pay

## 2022-12-31 ENCOUNTER — Other Ambulatory Visit (HOSPITAL_COMMUNITY): Payer: Self-pay

## 2022-12-31 NOTE — Telephone Encounter (Signed)
Benefit Verification [id number] Submitted!

## 2023-01-01 ENCOUNTER — Other Ambulatory Visit: Payer: Self-pay | Admitting: *Deleted

## 2023-01-01 MED ORDER — OXYCODONE-ACETAMINOPHEN 5-325 MG PO TABS: ORAL_TABLET | ORAL | 0 refills | Status: DC

## 2023-01-01 NOTE — Telephone Encounter (Signed)
PT has traditional Medicare A and B- This will be Buy and Bill and no PA needed.

## 2023-01-01 NOTE — Telephone Encounter (Signed)
Last seen 09/17/22 and next f/u 01/07/23. Per drug registry, last refilled 11/20/22 #30.

## 2023-01-07 ENCOUNTER — Telehealth: Payer: Self-pay | Admitting: Neurology

## 2023-01-07 ENCOUNTER — Ambulatory Visit (INDEPENDENT_AMBULATORY_CARE_PROVIDER_SITE_OTHER): Payer: Medicare Other | Admitting: Neurology

## 2023-01-07 ENCOUNTER — Telehealth: Payer: Self-pay

## 2023-01-07 ENCOUNTER — Encounter: Payer: Self-pay | Admitting: Neurology

## 2023-01-07 VITALS — BP 130/53 | HR 60 | Ht 64.0 in

## 2023-01-07 DIAGNOSIS — M549 Dorsalgia, unspecified: Secondary | ICD-10-CM

## 2023-01-07 DIAGNOSIS — M7918 Myalgia, other site: Secondary | ICD-10-CM | POA: Diagnosis not present

## 2023-01-07 DIAGNOSIS — I2510 Atherosclerotic heart disease of native coronary artery without angina pectoris: Secondary | ICD-10-CM

## 2023-01-07 DIAGNOSIS — R419 Unspecified symptoms and signs involving cognitive functions and awareness: Secondary | ICD-10-CM | POA: Diagnosis not present

## 2023-01-07 DIAGNOSIS — M542 Cervicalgia: Secondary | ICD-10-CM | POA: Diagnosis not present

## 2023-01-07 DIAGNOSIS — R4701 Aphasia: Secondary | ICD-10-CM | POA: Diagnosis not present

## 2023-01-07 DIAGNOSIS — M62838 Other muscle spasm: Secondary | ICD-10-CM

## 2023-01-07 MED ORDER — ONABOTULINUMTOXINA 100 UNITS IJ SOLR
100.0000 [IU] | Freq: Once | INTRAMUSCULAR | Status: AC
  Administered 2023-01-07: 100 [IU] via INTRAMUSCULAR

## 2023-01-07 NOTE — Progress Notes (Signed)
[facility]  PATIENT: Heather [name] DOB: 1946/03/23  REFERRING DOCTOR OR PCP:  Blanchie Serve SOURCE: patient  _________________________________   HISTORICAL  CHIEF COMPLAINT:  Chief Complaint  Patient presents with   Botulinum Toxin Injection    RM 1. Here for botox    HISTORY OF PRESENT ILLNESS:  Heather [name] is a 77 y.o. woman who has had chronic right-sided mid back pain since 2013.      Update 01/07/2023 Her myofascial pain in the right trunk has done very well over the past 3 to 4 months.  She notes being a little bit more physically active taking a part-time job in [employer].  There was not any significant pain with the injections last cycle.  When she has more of the myofascial pain pain, she will take half of a Percocet.  If she notes more spasticity she will take half of Valium.  The last prescription has lasted more than 6 months.  Besides working part-time, she notes being fairly active and does gardening.  She had noted some mild cognitive issues in 2022.  MRI of the brain showed atrophy.  Over the past few months she is noting more issues with word finding making multiple errors a day.  Most nights she sleeps well but she has rare insomnia when pain is worse and will rarely take diazepam (usually just one or two a month).  She never combines with a percocet.   She also reports more fatigue.       She is on coumadin for atrial fibrillation.          IMAGING:  MRI results from 10/14/2013: The MRI of the brain showed age related atrophy and minimal chronic microvascular ischemic change. MRI of the cervical spine showed multilevel mild degenerative changes with left paramedian disc herniation at C3-C4 and left disc protrusion at C4-C5 and C5-C6 and midline disc herniation at C6-C7. There was no report of nerve root compression. MRI of the thoracic spine showed disc desiccation but no herniation or protrusions. MRI of the lumbar spine showed disc  bulges at T12-L1 and L2-L3 and disc bulge with facet hypertrophy at L3-L4 and disc bulge with right foraminal annular tear at L4-L5.  MRI brain 7/4/2-22 was personally reviewed.  It shows mild generalized atrophy.  No acute findings.     REVIEW OF SYSTEMS: Constitutional: No fevers, chills, sweats, or change in appetite Eyes: No visual changes, double vision, eye pain Ear, nose and throat: No hearing loss, ear pain, nasal congestion, sore throat Cardiovascular: No chest pain, palpitations.   She has atrial fibrillation and is on warfarin Respiratory:  No shortness of breath at rest or with exertion.   No wheezes GastrointestinaI: No nausea, vomiting, diarrhea, abdominal pain, fecal incontinence Genitourinary:  No dysuria, urinary retention or frequency.  No nocturia. Musculoskeletal:  No neck pain.  She has back pain/thoracic pain as above Integumentary: No rash, pruritus, skin lesions Neurological: as above Psychiatric: No depression at this time.  No anxiety Endocrine: No palpitations, diaphoresis, change in appetite, change in weigh or increased thirst Hematologic/Lymphatic:  No anemia, purpura, petechiae. Allergic/Immunologic: No itchy/runny eyes, nasal congestion, recent allergic reactions, rashes  ALLERGIES: Allergies  Allergen Reactions   Statins Other (See Comments)    Muscle weakness   Other     EKG pads causes blisters    Sulfa Antibiotics Rash    HOME MEDICATIONS:  Current Outpatient Medications:    buPROPion (WELLBUTRIN SR) 150 MG 12 hr tablet, TAKE  ONE TABLET BY MOUTH TWICE A DAY, Disp: 180 tablet, Rfl: 1   Cholecalciferol (VITAMIN D3) 125 MCG (5000 UT) TABS, Take 5,000 Units by mouth daily at 12 noon., Disp: , Rfl:    Coenzyme Q10 100 MG capsule, Take 100 mg by mouth daily at 12 noon., Disp: , Rfl:    diazepam (VALIUM) 5 MG tablet, Take 1 tablet (5 mg total) by mouth daily as needed for anxiety or muscle spasms., Disp: 30 tablet, Rfl: 0   diclofenac Sodium  (VOLTAREN) 1 % GEL, Apply 4 g topically 4 (four) times daily., Disp: 150 g, Rfl: 0   docusate sodium (COLACE) 100 MG capsule, Take 100 mg by mouth 2 (two) times daily. , Disp: , Rfl:    estradiol (ESTRACE) 1 MG tablet, Take 1 mg by mouth daily., Disp: , Rfl:    nitroGLYCERIN (NITROSTAT) 0.4 MG SL tablet, Place 1 tablet under the tongue every 5 minutes as needed for chest pain., Disp: 25 tablet, Rfl: 1   oxyCODONE-acetaminophen (PERCOCET/ROXICET) 5-325 MG tablet, TAKE 1 TABLET DAILY AS NEEDED FOR SEVERE PAIN., Disp: 30 tablet, Rfl: 0   progesterone (PROMETRIUM) 200 MG capsule, Take 1 capsule (200 mg total) by mouth daily., Disp: 90 capsule, Rfl: 1   thyroid (NP THYROID) 30 MG tablet, Take 2 tablets once daily on Monday, Wednesday and Friday. Take 1 tablet once daily on other days., Disp: 90 tablet, Rfl: 0   warfarin (COUMADIN) 5 MG tablet, Take 1 tablet to 1 1/2 tablets daily as directed by coumadin clinic, Disp: 135 tablet, Rfl: 1  PAST MEDICAL HISTORY: Past Medical History:  Diagnosis Date   Adrenal adenoma    Allergy    Anemia    Anxiety    CAD (coronary artery disease)    a. 50-60% mid LAD at cardiac catheterization 2014 [facility]) b. non-obstructive CAD by cath in 09/2018   Cataract    Depression    DJD (degenerative joint disease) of cervical spine 09/12/2016   Dysrhythmia    H. pylori infection 07/19/2017   History of cardiomyopathy    History of COVID-19 12/10/2019   History of tobacco use    Hyperlipidemia    Hypothyroidism 08/24/2022   Mid back pain 02/08/2017   Neuromuscular disorder (HCC)    PAF (paroxysmal atrial fibrillation) ([place])    Diagnosis July 2017 - spontaneously resolved   Smell disturbance 06/13/2021   Statin intolerance     PAST SURGICAL HISTORY: Past Surgical History:  Procedure Laterality Date   ABDOMINAL HYSTERECTOMY  1980   endometriosis   BIOPSY  07/18/2021   Procedure: BIOPSY;  Surgeon: Harvel Quale, MD;  [facility];   Service: Gastroenterology;;   CATARACT EXTRACTION Left 11/2018   COLONOSCOPY WITH PROPOFOL N/A 06/27/2022   Procedure: COLONOSCOPY WITH PROPOFOL;  Surgeon: Harvel Quale, MD;  [facility];  Service: Gastroenterology;  Laterality: N/A;  11:15   ESOPHAGOGASTRODUODENOSCOPY (EGD) WITH PROPOFOL N/A 07/18/2021   Procedure: ESOPHAGOGASTRODUODENOSCOPY (EGD) WITH PROPOFOL;  Surgeon: Harvel Quale, MD;  [facility];  Service: Gastroenterology;  Laterality: N/A;  12:00   EYE SURGERY  cataracts  2021/2022   HEMOSTASIS CLIP PLACEMENT  06/27/2022   Procedure: HEMOSTASIS CLIP PLACEMENT;  Surgeon: Harvel Quale, MD;  [facility];  Service: Gastroenterology;;   INTRAVASCULAR PRESSURE WIRE/FFR STUDY N/A 06/01/2021   Procedure: INTRAVASCULAR PRESSURE WIRE/FFR STUDY;  Surgeon: Belva Crome, MD;  Location: [facility];  Service: Cardiovascular;  Laterality: N/A;  LEFT HEART CATH AND CORONARY ANGIOGRAPHY N/A 09/25/2018   Procedure: LEFT HEART CATH AND CORONARY ANGIOGRAPHY;  Surgeon: Belva Crome, MD;  Location: [facility];  Service: Cardiovascular;  Laterality: N/A;   LEFT HEART CATH AND CORONARY ANGIOGRAPHY N/A 06/01/2021   Procedure: LEFT HEART CATH AND CORONARY ANGIOGRAPHY;  Surgeon: Belva Crome, MD;  Location: [facility];  Service: Cardiovascular;  Laterality: N/A;   POLYPECTOMY  06/27/2022   Procedure: POLYPECTOMY;  Surgeon: Harvel Quale, MD;  [facility];  Service: Gastroenterology;;   Azzie Almas DILATION  07/18/2021   Procedure: Azzie Almas DILATION;  Surgeon: Montez Morita, Quillian Quince, MD;  [facility];  Service: Gastroenterology;;   SUBMUCOSAL LIFTING INJECTION  06/27/2022   Procedure: SUBMUCOSAL LIFTING INJECTION;  Surgeon: Montez Morita, Quillian Quince, MD;  [facility];  Service: Gastroenterology;;   SUBMUCOSAL TATTOO INJECTION  06/27/2022   Procedure: SUBMUCOSAL  TATTOO INJECTION;  Surgeon: Montez Morita, Quillian Quince, MD;  [facility];  Service: Gastroenterology;;   TONSILLECTOMY  1954    FAMILY HISTORY: Family History  Problem Relation Age of Onset   COPD Mother    Hearing loss Mother    Stroke Mother    Heart disease Mother        chf, died at 21   Vision loss Mother    Varicose Veins Mother    Arthritis Father    Hypertension Father    Heart disease Father        cad, pvd, died at 57   Vision loss Father    Heart disease Sister        heart valve   Arthritis Sister    Depression Sister    Hyperlipidemia Brother    Hypertension Brother    Cancer - Prostate Brother     SOCIAL HISTORY:  Social History   Socioeconomic History   Marital status: Married    Spouse name: Al   Number of children: 0   Years of education: 18   Highest education level: Not on file  Occupational History   Occupation: Probation officer    Comment: from home  Tobacco Use   Smoking status: Former    Packs/day: 1.00    Years: 40.00    Total pack years: 40.00    Types: Cigarettes    Start date: 11/12/1965    Quit date: 09/06/2008    Years since quitting: 14.3   Smokeless tobacco: Never  Vaping Use   Vaping Use: Never used  Substance and Sexual Activity   Alcohol use: Yes    Alcohol/week: 2.0 standard drinks of alcohol    Types: 1 Glasses of wine, 1 Shots of liquor per week    Comment: occasional   Drug use: Yes    Types: Hydrocodone, Marijuana    Comment: prescribed Percocet for myofacial pain syndrome   Sexual activity: Yes    Birth control/protection: Post-menopausal  Other Topics Concern   Not on file  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   Lives with husband AL   No children   Healthy diet and lifestyle   Social Determinants of Health   Financial Resource Strain: Low Risk  (09/13/2022)   Overall Financial Resource Strain (CARDIA)    Difficulty of Paying Living Expenses: Not very hard  Food Insecurity: No Food Insecurity  (09/13/2022)   Hunger Vital Sign    Worried About Running Out of Food in the Last Year: Never true    Ran Out  of Food in the Last Year: Never true  Transportation Needs: No Transportation Needs (09/13/2022)   PRAPARE - Hydrologist (Medical): No    Lack of Transportation (Non-Medical): No  Physical Activity: Insufficiently Active (09/13/2022)   Exercise Vital Sign    Days of Exercise per Week: 2 days    Minutes of Exercise per Session: 30 min  Stress: No Stress Concern Present (09/13/2022)   [place]    Feeling of Stress : Not at all  Social Connections: Unknown (09/13/2022)   Social Connection and Isolation Panel [NHANES]    Frequency of Communication with Friends and Family: More than three times a week    Frequency of Social Gatherings with Friends and Family: Three times a week    Attends Religious Services: Not on file    Active Member of Clubs or Organizations: Yes    Attends Club or Organization Meetings: 1 to 4 times per year    Marital Status: Married  Human resources officer Violence: Not At Risk (09/13/2021)   Humiliation, Afraid, Rape, and Kick questionnaire    Fear of Current or Ex-Partner: No    Emotionally Abused: No    Physically Abused: No    Sexually Abused: No     PHYSICAL EXAM   General: The patient is well-developed and well-nourished and in no acute distress.  Head is normocephalic and atraumatic.   Musculoskeletal : There is tenderness in the mid to lower thoracic intercoastal muscles from T6-T9 on the right .  The neck was nontender today.  Neurologic Exam  Mental status: The patient is alert and oriented x 3 at the time of the examination.   Focus and attention seem normal.   I did not notice significant word finding difficulty today though she did pause twice to come up with the right word.  Cranial nerves: Extraocular muscles are intact.  Facial strength is normal.    . No obvious hearing deficits are noted.  Motor: She has normal muscle tone, muscle bulk and muscle strength in the arms or legs.  gait and station: Gait and station are normal.  The Romberg is negative    ASSESSMENT AND PLAN  1. Cognitive complaints   2. Aphasia   3. Myofascial pain syndrome   4. Mid back pain   5. Muscle spasm   6. Neck pain        1.  Inject 100 units of Botox using 30-gauge needle split into tender points below the T6-T9 ribs into the intercostal muscles near the midclavicular line.  There were no complications and she tolerated the injections well.   2.   She should continue to stay active and exercise as tolerated.  Can take rare Percocet as needed for days when there is more intense pain.  If severe spasms take Valium 3.    She has noted more difficulty with word finding over the last half year.  MRI of the brain in 2022 had shown some atrophy but no acute findings.  To further evaluate this hide will recheck an MRI to determine if there has been a change in the atrophy and the atrophy pattern and also have her do neurocognitive testing.  This will help [place] assess for a primary progressive aphasia.  We discussed that hypervigilance can also lead to similar symptoms. 4.   She will return in 3-4 months for next Botox or sooner if  new or worsening neurologic symptoms.  Juneau Doughman A. Felecia Shelling, MD, PhD [id number], [id number] PM Certified in Neurology, Clinical Neurophysiology, Sleep Medicine, Pain Medicine and Neuroimaging  [facility] [address] [place], [place] [address] [phone]

## 2023-01-07 NOTE — Telephone Encounter (Signed)
sent to GI they obtain Heather [name] [facility] [id number]

## 2023-01-08 ENCOUNTER — Telehealth: Payer: Self-pay | Admitting: Neurology

## 2023-01-08 NOTE — Telephone Encounter (Signed)
Referral for neuropsychology fax to [facility]. Phone: [phone], Fax: [phone].

## 2023-01-08 NOTE — Telephone Encounter (Signed)
No further evaluation is needed 

## 2023-01-09 ENCOUNTER — Encounter: Payer: Self-pay | Admitting: Neurology

## 2023-01-15 ENCOUNTER — Other Ambulatory Visit: Payer: Self-pay | Admitting: *Deleted

## 2023-01-15 MED ORDER — ALPRAZOLAM 0.5 MG PO TABS: ORAL_TABLET | ORAL | 0 refills | Status: DC

## 2023-01-28 ENCOUNTER — Encounter: Payer: Self-pay | Admitting: Internal Medicine

## 2023-01-28 ENCOUNTER — Ambulatory Visit: Payer: Medicare Other | Attending: Cardiology | Admitting: *Deleted

## 2023-01-28 ENCOUNTER — Telehealth: Payer: Self-pay | Admitting: Internal Medicine

## 2023-01-28 ENCOUNTER — Other Ambulatory Visit: Payer: Self-pay

## 2023-01-28 DIAGNOSIS — E039 Hypothyroidism, unspecified: Secondary | ICD-10-CM | POA: Diagnosis not present

## 2023-01-28 DIAGNOSIS — Z5181 Encounter for therapeutic drug level monitoring: Secondary | ICD-10-CM | POA: Diagnosis not present

## 2023-01-28 DIAGNOSIS — I480 Paroxysmal atrial fibrillation: Secondary | ICD-10-CM | POA: Insufficient documentation

## 2023-01-28 LAB — POCT INR: INR: 3.7 — AB (ref 2.0–3.0)

## 2023-01-28 MED ORDER — THYROID 30 MG PO TABS: ORAL_TABLET | ORAL | 0 refills | Status: DC

## 2023-01-28 NOTE — Patient Instructions (Signed)
Started Vegetarian diet Hold warfarin tonight then decrease dose to 1 tablet daily except 1 1/2 tablets on M,W,F Recheck INR in 3 wks.

## 2023-01-28 NOTE — Telephone Encounter (Signed)
Patient is requesting refill on thyroid (NP THYROID) 30 MG tablet  [facility] [address]  Patient has had thyroid testing done 3/18. Patient is out of med.

## 2023-01-28 NOTE — Telephone Encounter (Signed)
Refills sent to pharmacy. 

## 2023-01-29 ENCOUNTER — Other Ambulatory Visit: Payer: Self-pay | Admitting: Internal Medicine

## 2023-01-29 DIAGNOSIS — E039 Hypothyroidism, unspecified: Secondary | ICD-10-CM

## 2023-01-29 LAB — TSH+FREE T4
Free T4: 0.9 ng/dL (ref 0.82–1.77)
TSH: 6.1 u[IU]/mL — ABNORMAL HIGH (ref 0.450–4.500)

## 2023-01-30 ENCOUNTER — Ambulatory Visit
Admission: RE | Admit: 2023-01-30 | Discharge: 2023-01-30 | Disposition: A | Discharge status: Home / Self Care | Payer: Medicare Other | Source: Ambulatory Visit | Attending: Neurology | Admitting: Neurology

## 2023-01-30 DIAGNOSIS — R4701 Aphasia: Secondary | ICD-10-CM

## 2023-01-30 DIAGNOSIS — R419 Unspecified symptoms and signs involving cognitive functions and awareness: Secondary | ICD-10-CM

## 2023-01-31 ENCOUNTER — Encounter: Payer: Self-pay | Admitting: Neurology

## 2023-02-08 ENCOUNTER — Encounter: Payer: Self-pay | Admitting: Internal Medicine

## 2023-02-11 ENCOUNTER — Other Ambulatory Visit: Payer: Self-pay | Admitting: *Deleted

## 2023-02-11 DIAGNOSIS — R4701 Aphasia: Secondary | ICD-10-CM

## 2023-02-11 DIAGNOSIS — R419 Unspecified symptoms and signs involving cognitive functions and awareness: Secondary | ICD-10-CM

## 2023-02-11 NOTE — Telephone Encounter (Signed)
Referral sent to [facility], phone # [phone].

## 2023-02-13 ENCOUNTER — Encounter: Payer: Self-pay | Admitting: Internal Medicine

## 2023-02-13 ENCOUNTER — Encounter: Payer: Self-pay | Admitting: Neurology

## 2023-02-13 ENCOUNTER — Ambulatory Visit (INDEPENDENT_AMBULATORY_CARE_PROVIDER_SITE_OTHER): Payer: Medicare Other | Admitting: Internal Medicine

## 2023-02-13 VITALS — BP 127/66 | HR 57 | Ht 64.0 in

## 2023-02-13 DIAGNOSIS — F411 Generalized anxiety disorder: Secondary | ICD-10-CM | POA: Diagnosis not present

## 2023-02-13 DIAGNOSIS — I2510 Atherosclerotic heart disease of native coronary artery without angina pectoris: Secondary | ICD-10-CM

## 2023-02-13 DIAGNOSIS — E039 Hypothyroidism, unspecified: Secondary | ICD-10-CM | POA: Diagnosis not present

## 2023-02-13 DIAGNOSIS — M7918 Myalgia, other site: Secondary | ICD-10-CM | POA: Diagnosis not present

## 2023-02-13 DIAGNOSIS — G319 Degenerative disease of nervous system, unspecified: Secondary | ICD-10-CM

## 2023-02-13 MED ORDER — OXYCODONE-ACETAMINOPHEN 5-325 MG PO TABS: ORAL_TABLET | ORAL | 0 refills | Status: DC

## 2023-02-13 MED ORDER — THYROID 60 MG PO TABS: 60.0000 mg | ORAL_TABLET | Freq: Every day | ORAL | 0 refills | Status: DC

## 2023-02-13 NOTE — Telephone Encounter (Signed)
Patient last seen in for office visit was 06/2021.  Last seen for botox 01/07/23 New botox scheduled on 04/01/23 Rx last filled on 01/01/23 # 30 tablets  Rx pending to be signed

## 2023-02-13 NOTE — Assessment & Plan Note (Signed)
Followed by neurology Gets Botox injections 

## 2023-02-13 NOTE — Assessment & Plan Note (Signed)
S/p stent placement Has history of Takotsubo cardiomyopathy Followed by cardiology On Coumadin for A. fib, did not tolerate statin in the past 

## 2023-02-13 NOTE — Assessment & Plan Note (Addendum)
Lab Results  Component Value Date   TSH 6.100 (H) 01/28/2023    Did not tolerate levothyroxine On NP thyroid 60 mg MWF, 30 mg on TThSaS Increased dose of NP thyroid to 60 mg QD Recheck TSH and free T4 after 3 months  Her thyroid profile is improving, reassured that it is not a likely reason for word finding difficulty

## 2023-02-13 NOTE — Progress Notes (Signed)
Established Patient Office Visit  Subjective:  Patient ID: Heather [name], female    DOB: 11-05-1946  Age: 77 y.o. MRN: [id number]  CC:  Chief Complaint  Patient presents with   cognitive issues    Patient seen a neurologist and was concerned for cognitive issues. She is also concerned for thyroid problems.    HPI Heather [name] is a 77 y.o. female with past medical history of CAD, PAF, HLD, myofascial pain syndrome and depression with anxiety who presents for f/u of her chronic medical conditions.  She sees neurologist for myofascial pain syndrome, for which she gets Botox injections.  She reports word finding difficulty or a mixup of words at times.  She does admit that it happens when she is thinking about multiple things at the time.  She had an MRI of brain recently, which showed stable cerebral atrophy. 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 She is planned to get neurocognitive evaluation.   She had been taking NP thyroid for HLD despite her A. fib.  Her TSH was <0.01 in 12/2020. She prefers to take NP thyroid as she did not tolerate Levothyroxine. She has noticed weight gain and fatigue since the last visit, although weight was reported by her and not measured in the office per her request.  She also takes estradiol and progesterone currently. She follows up with hormone clinic for it.  She has history of CAD s/p stent placement and paroxysmal atrial fibrillation.  She is on Coumadin currently.  She sees cardiologist for it.  She has a history of HLD, but did not tolerate 3 different statins in the past.     She is on Wellbutrin and as needed diazepam for depression with anxiety.  She denies any anhedonia, SI or HI.    Past Medical History:  Diagnosis Date   Adrenal adenoma    Allergy    Anemia    Anxiety    CAD (coronary artery disease)    a. 50-60% mid LAD at cardiac catheterization 2014 [facility]) b. non-obstructive CAD by  cath in 09/2018   Cataract    Depression    DJD (degenerative joint disease) of cervical spine 09/12/2016   Dysrhythmia    H. pylori infection 07/19/2017   History of cardiomyopathy    History of COVID-19 12/10/2019   History of tobacco use    Hyperlipidemia    Hypothyroidism 08/24/2022   Mid back pain 02/08/2017   Neuromuscular disorder    PAF (paroxysmal atrial fibrillation)    Diagnosis July 2017 - spontaneously resolved   Smell disturbance 06/13/2021   Statin intolerance     Past Surgical History:  Procedure Laterality Date   ABDOMINAL HYSTERECTOMY  1980   endometriosis   BIOPSY  07/18/2021   Procedure: BIOPSY;  Surgeon: Harvel Quale, MD;  [facility];  Service: Gastroenterology;;   CATARACT EXTRACTION Left 11/2018   COLONOSCOPY WITH PROPOFOL N/A 06/27/2022   Procedure: COLONOSCOPY WITH PROPOFOL;  Surgeon: Harvel Quale, MD;  [facility];  Service: Gastroenterology;  Laterality: N/A;  11:15   CORONARY PRESSURE/FFR STUDY N/A 06/01/2021   Procedure: INTRAVASCULAR PRESSURE WIRE/FFR STUDY;  Surgeon: Belva Crome, MD;  Location: [place] [facility];  Service: Cardiovascular;  Laterality: N/A;   ESOPHAGOGASTRODUODENOSCOPY (EGD) WITH PROPOFOL N/A 07/18/2021   Procedure: ESOPHAGOGASTRODUODENOSCOPY (EGD) WITH PROPOFOL;  Surgeon: Harvel Quale, MD;  [facility];  Service: Gastroenterology;  Laterality: N/A;  12:00   EYE SURGERY  cataracts  2021/2022   HEMOSTASIS CLIP PLACEMENT  06/27/2022   Procedure: HEMOSTASIS CLIP PLACEMENT;  Surgeon: Harvel Quale, MD;  [facility];  Service: Gastroenterology;;   LEFT HEART CATH AND CORONARY ANGIOGRAPHY N/A 09/25/2018   Procedure: LEFT HEART CATH AND CORONARY ANGIOGRAPHY;  Surgeon: Belva Crome, MD;  Location: [place] [facility];  Service: Cardiovascular;  Laterality: N/A;   LEFT HEART CATH AND CORONARY ANGIOGRAPHY N/A 06/01/2021   Procedure: LEFT  HEART CATH AND CORONARY ANGIOGRAPHY;  Surgeon: Belva Crome, MD;  Location: [place] [facility];  Service: Cardiovascular;  Laterality: N/A;   POLYPECTOMY  06/27/2022   Procedure: POLYPECTOMY;  Surgeon: Harvel Quale, MD;  [facility];  Service: Gastroenterology;;   Azzie Almas DILATION  07/18/2021   Procedure: Azzie Almas DILATION;  Surgeon: Montez Morita, Quillian Quince, MD;  [facility];  Service: Gastroenterology;;   SUBMUCOSAL LIFTING INJECTION  06/27/2022   Procedure: SUBMUCOSAL LIFTING INJECTION;  Surgeon: Montez Morita, Quillian Quince, MD;  [facility];  Service: Gastroenterology;;   SUBMUCOSAL TATTOO INJECTION  06/27/2022   Procedure: SUBMUCOSAL TATTOO INJECTION;  Surgeon: Montez Morita, Quillian Quince, MD;  [facility];  Service: Gastroenterology;;   TONSILLECTOMY  1954    Family History  Problem Relation Age of Onset   COPD Mother    Hearing loss Mother    Stroke Mother    Heart disease Mother        chf, died at 18   Vision loss Mother    Varicose Veins Mother    Arthritis Father    Hypertension Father    Heart disease Father        cad, pvd, died at 43   Vision loss Father    Heart disease Sister        heart valve   Arthritis Sister    Depression Sister    Hyperlipidemia Brother    Hypertension Brother    Cancer - Prostate Brother     Social History   Socioeconomic History   Marital status: Married    Spouse name: Al   Number of children: 0   Years of education: 18   Highest education level: Master's degree (e.g., MA, MS, MEng, MEd, MSW, MBA)  Occupational History   Occupation: Probation officer    Comment: from home  Tobacco Use   Smoking status: Former    Packs/day: 1.00    Years: 40.00    Additional pack years: 0.00    Total pack years: 40.00    Types: Cigarettes    Start date: 11/12/1965    Quit date: 09/06/2008    Years since quitting: 14.4   Smokeless tobacco: Never  Vaping Use   Vaping Use: Never used  Substance  and Sexual Activity   Alcohol use: Yes    Alcohol/week: 2.0 standard drinks of alcohol    Types: 1 Glasses of wine, 1 Shots of liquor per week    Comment: occasional   Drug use: Yes    Types: Hydrocodone, Marijuana    Comment: prescribed Percocet for myofacial pain syndrome   Sexual activity: Yes    Birth control/protection: Post-menopausal  Other Topics Concern   Not on file  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   Lives with husband AL   No children   Healthy diet and lifestyle   Social Determinants of Health   Financial Resource Strain: Low Risk  (02/09/2023)   Overall Financial  Resource Strain (CARDIA)    Difficulty of Paying Living Expenses: Not very hard  Food Insecurity: No Food Insecurity (02/09/2023)   Hunger Vital Sign    Worried About Running Out of Food in the Last Year: Never true    Ran Out of Food in the Last Year: Never true  Transportation Needs: No Transportation Needs (02/09/2023)   PRAPARE - Hydrologist (Medical): No    Lack of Transportation (Non-Medical): No  Physical Activity: Insufficiently Active (02/09/2023)   Exercise Vital Sign    Days of Exercise per Week: 3 days    Minutes of Exercise per Session: 30 min  Stress: Stress Concern Present (02/09/2023)   [employer]    Feeling of Stress : To some extent  Social Connections: Moderately Integrated (02/09/2023)   Social Connection and Isolation Panel [NHANES]    Frequency of Communication with Friends and Family: More than three times a week    Frequency of Social Gatherings with Friends and Family: Twice a week    Attends Religious Services: Never    Marine scientist or Organizations: Yes    Attends Archivist Meetings: 1 to 4 times per year    Marital Status: Married  Human resources officer Violence: Not At Risk (09/13/2021)   Humiliation, Afraid, Rape, and Kick questionnaire    Fear of  Current or Ex-Partner: No    Emotionally Abused: No    Physically Abused: No    Sexually Abused: No    Outpatient Medications Prior to Visit  Medication Sig Dispense Refill   ALPRAZolam (XANAX) 0.5 MG tablet Take 1-2 tablets 30 min prior to MRI. Can take additional tablet at time of test if needed. Must have driver to and from test. Can cause drowsiness. 3 tablet 0   buPROPion (WELLBUTRIN SR) 150 MG 12 hr tablet TAKE ONE TABLET BY MOUTH TWICE A DAY 180 tablet 1   Cholecalciferol (VITAMIN D3) 125 MCG (5000 UT) TABS Take 5,000 Units by mouth daily at 12 noon.     Coenzyme Q10 100 MG capsule Take 100 mg by mouth daily at 12 noon.     diazepam (VALIUM) 5 MG tablet Take 1 tablet (5 mg total) by mouth daily as needed for anxiety or muscle spasms. 30 tablet 0   diclofenac Sodium (VOLTAREN) 1 % GEL Apply 4 g topically 4 (four) times daily. 150 g 0   docusate sodium (COLACE) 100 MG capsule Take 100 mg by mouth 2 (two) times daily.      estradiol (ESTRACE) 1 MG tablet Take 1 mg by mouth daily.     nitroGLYCERIN (NITROSTAT) 0.4 MG SL tablet Place 1 tablet under the tongue every 5 minutes as needed for chest pain. 25 tablet 1   oxyCODONE-acetaminophen (PERCOCET/ROXICET) 5-325 MG tablet TAKE 1 TABLET DAILY AS NEEDED FOR SEVERE PAIN. 30 tablet 0   progesterone (PROMETRIUM) 200 MG capsule Take 1 capsule (200 mg total) by mouth daily. 90 capsule 1   warfarin (COUMADIN) 5 MG tablet Take 1 tablet to 1 1/2 tablets daily as directed by coumadin clinic 135 tablet 1   thyroid (NP THYROID) 30 MG tablet Take 2 tablets once daily on Monday, Wednesday and Friday. Take 1 tablet once daily on other days. 90 tablet 0   No facility-administered medications prior to visit.    Allergies  Allergen Reactions   Statins Other (See Comments)    Muscle weakness   Other  EKG pads causes blisters    Sulfa Antibiotics Rash    ROS Review of Systems  Constitutional:  Positive for fatigue. Negative for chills and fever.   HENT:  Negative for congestion, sinus pressure, sinus pain and sore throat.   Eyes:  Negative for pain and discharge.  Respiratory:  Negative for cough and shortness of breath.   Cardiovascular:  Negative for chest pain and palpitations.  Gastrointestinal:  Negative for abdominal pain, diarrhea, nausea and vomiting.  Endocrine: Negative for polydipsia and polyuria.  Genitourinary:  Negative for dysuria and hematuria.  Musculoskeletal:  Negative for neck pain and neck stiffness.  Skin:  Negative for rash.  Neurological:  Positive for numbness. Negative for dizziness and weakness.  Psychiatric/Behavioral:  Negative for agitation and behavioral problems. The patient is nervous/anxious.       Objective:    Physical Exam Vitals reviewed.  Constitutional:      General: She is not in acute distress.    Appearance: She is not diaphoretic.  HENT:     Head: Normocephalic and atraumatic.     Nose: Nose normal.     Mouth/Throat:     Mouth: Mucous membranes are moist.  Eyes:     General: No scleral icterus.    Extraocular Movements: Extraocular movements intact.  Cardiovascular:     Rate and Rhythm: Normal rate and regular rhythm.     Heart sounds: Normal heart sounds. No murmur heard. Pulmonary:     Breath sounds: Normal breath sounds. No wheezing or rales.  Musculoskeletal:     Cervical back: Neck supple. No tenderness.     Right lower leg: No edema.     Left lower leg: No edema.  Skin:    General: Skin is warm.     Findings: No rash.  Neurological:     General: No focal deficit present.     Mental Status: She is alert and oriented to person, place, and time.  Psychiatric:        Mood and Affect: Mood normal.        Behavior: Behavior normal.     BP 127/66 (BP Location: Right Arm, Patient Position: Sitting, Cuff Size: Normal)   Pulse (!) 57   Ht 5\' 4"  (1.626 m)   LMP 07/08/1979 (Approximate)   SpO2 95%   BMI 27.46 kg/m  Wt Readings from Last 3 Encounters:  11/26/22  160 lb (72.6 kg)  10/08/22 158 lb (71.7 kg)  06/27/22 158 lb (71.7 kg)    Lab Results  Component Value Date   TSH 6.100 (H) 01/28/2023   Lab Results  Component Value Date   WBC 5.8 08/23/2022   HGB 11.8 08/23/2022   HCT 35.6 08/23/2022   MCV 88 08/23/2022   PLT 230 08/23/2022   Lab Results  Component Value Date   NA 142 08/23/2022   K 4.8 08/23/2022   CO2 22 08/23/2022   GLUCOSE 96 08/23/2022   BUN 24 08/23/2022   CREATININE 0.82 08/23/2022   BILITOT 0.5 06/05/2020   ALKPHOS 55 06/05/2020   AST 21 06/05/2020   ALT 20 06/05/2020   PROT 7.4 06/05/2020   ALBUMIN 4.3 06/05/2020   CALCIUM 9.7 08/23/2022   ANIONGAP 6 05/15/2021   EGFR 74 08/23/2022   Lab Results  Component Value Date   CHOL 366 (H) 08/23/2022   Lab Results  Component Value Date   HDL 53 08/23/2022   Lab Results  Component Value Date   LDLCALC 260 (H) 08/23/2022  Lab Results  Component Value Date   TRIG 249 (H) 08/23/2022   Lab Results  Component Value Date   CHOLHDL 6.9 (H) 08/23/2022   No results found for: "HGBA1C"    Assessment & Plan:   Problem List Items Addressed This Visit       Cardiovascular and Mediastinum   CAD (coronary artery disease)    S/p stent placement Has history of Takotsubo cardiomyopathy Followed by cardiology On Coumadin for A. fib, did not tolerate statin in the past        Endocrine   Hypothyroidism    Lab Results  Component Value Date   TSH 6.100 (H) 01/28/2023   Did not tolerate levothyroxine On NP thyroid 60 mg MWF, 30 mg on TThSaS Increased dose of NP thyroid to 60 mg QD Recheck TSH and free T4 after 3 months  Her thyroid profile is improving, reassured that it is not a likely reason for word finding difficulty      Relevant Medications   thyroid (NP THYROID) 60 MG tablet     Nervous and Auditory   Cerebral atrophy - Primary    Noted on MRI brain She reports mild word finding difficulty, although it is likely due to hypervigilance She  is very active person, 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    Dispense:  90 tablet    Refill:  0    Follow-up: Return if symptoms worsen or fail to improve.    Lindell Spar, MD

## 2023-02-13 NOTE — Assessment & Plan Note (Signed)
Well-controlled with PRN Diazepam 

## 2023-02-13 NOTE — Patient Instructions (Signed)
Please start taking NP thyroid 60 mg once daily.  Please continue to take other medications as prescribed.  Please continue to follow heart healthy diet and ambulate as tolerated.

## 2023-02-13 NOTE — Assessment & Plan Note (Signed)
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

## 2023-02-20 ENCOUNTER — Encounter: Payer: Self-pay | Admitting: Internal Medicine

## 2023-02-22 ENCOUNTER — Other Ambulatory Visit: Payer: Self-pay | Admitting: Internal Medicine

## 2023-02-22 DIAGNOSIS — E039 Hypothyroidism, unspecified: Secondary | ICD-10-CM

## 2023-02-25 ENCOUNTER — Ambulatory Visit: Payer: Medicare Other | Attending: Cardiology | Admitting: *Deleted

## 2023-02-25 DIAGNOSIS — I480 Paroxysmal atrial fibrillation: Secondary | ICD-10-CM | POA: Diagnosis not present

## 2023-02-25 DIAGNOSIS — Z5181 Encounter for therapeutic drug level monitoring: Secondary | ICD-10-CM | POA: Diagnosis not present

## 2023-02-25 LAB — POCT INR: INR: 1.9 — AB (ref 2.0–3.0)

## 2023-02-25 NOTE — Patient Instructions (Signed)
Started Vegetarian diet Increase warfarin to 1 1/2 tablets daily except 1 tablet on Sundays, Tuesdays and Thursdays Recheck INR in 4 wks.

## 2023-02-27 ENCOUNTER — Ambulatory Visit: Payer: Medicare Other | Admitting: Internal Medicine

## 2023-03-05 ENCOUNTER — Ambulatory Visit: Payer: Medicare Other | Attending: Internal Medicine | Admitting: Internal Medicine

## 2023-03-05 ENCOUNTER — Encounter: Payer: Self-pay | Admitting: Internal Medicine

## 2023-03-05 VITALS — BP 128/74 | HR 59 | Ht 60.0 in | Wt 158.0 lb

## 2023-03-05 DIAGNOSIS — T466X5A Adverse effect of antihyperlipidemic and antiarteriosclerotic drugs, initial encounter: Secondary | ICD-10-CM | POA: Insufficient documentation

## 2023-03-05 DIAGNOSIS — I2510 Atherosclerotic heart disease of native coronary artery without angina pectoris: Secondary | ICD-10-CM | POA: Diagnosis not present

## 2023-03-05 DIAGNOSIS — E7849 Other hyperlipidemia: Secondary | ICD-10-CM | POA: Diagnosis not present

## 2023-03-05 DIAGNOSIS — M791 Myalgia, unspecified site: Secondary | ICD-10-CM

## 2023-03-05 NOTE — Patient Instructions (Signed)
Medication Instructions:  We will start insurance authorization/investigation for Leqvio  *If you need a refill on your cardiac medications before your next appointment, please call your pharmacy*   Lab Work: Please return for FASTING labs prior to follow up appointment in 6 months (NMR lipoprofile and LPa)  Our in office lab hours are Monday-Friday 8:00-4:00, closed for lunch 1-2 pm.  No appointment needed.  [facility] locations:   [place] - [address] - [address] ([place]) - [phone] [address]   [place] - [address]    [place]  - [address]    [place] - [address] - [address] [employer]  - [address] - [address] ([facility])  Follow-Up: At [facility], you and your health needs are our priority.  As part of our continuing mission to provide you with exceptional heart care, we have created designated Provider Care Teams.  These Care Teams include your primary Cardiologist (physician) and Advanced Practice Providers (APPs -  Physician Assistants and Nurse Practitioners) who all work together to provide you with the care you need, when you need it.  We recommend signing up for the patient portal called "MyChart".  Sign up information is provided on this After Visit Summary.  MyChart is used to connect with patients for Virtual Visits (Telemedicine).  Patients are able to view lab/test results, encounter notes, upcoming appointments, etc.  Non-urgent messages can be sent to your provider as well.   To learn more about what you can do with MyChart, go to [email or web address].    Your next appointment:   Dr. Rennis Golden recommends that you schedule a follow up visit with him the in the [facility] in 6 month. Please have fasting blood work about 1 week prior to this visit and he will review  the blood work results with you at your appointment.

## 2023-03-05 NOTE — Progress Notes (Signed)
LIPID CLINIC CONSULT NOTE  Chief Complaint:  Familial hyperlipidemia  Primary Care Physician: Anabel Halon, MD  Primary Cardiologist:  Nona Dell, MD  HPI:  Heather [name] is a 77 y.o. female who is being seen today for the evaluation of familial hyperlipidemia at the request of Anabel Halon, MD. this is a pleasant 77 year old female kindly referred for evaluation management of hyperlipidemia.  She reports longstanding history of high cholesterol and had previously tried statins back in her 72s but has been off of statins for years.  She reports having had 3 prior cardiac catheterization showing mild to moderate nonobstructive coronary disease.  Recent lipids showed total cholesterol 366, triglycerides 249, HDL 53 and LDL 260.  She has been intolerant to statins and is on no current lipid-lowering therapy.  She also has a history of atrial fibrillation on warfarin.  In the past she tells me that it was investigated to put her on PCSK9 inhibitor antibody therapy however the cost was prohibitive and she did not start it.  She has been referred today for potential options for lipid-lowering.  PMHx:  Past Medical History:  Diagnosis Date   Adrenal adenoma    Allergy    Anemia    Anxiety    CAD (coronary artery disease)    a. 50-60% mid LAD at cardiac catheterization 2014 [facility]) b. non-obstructive CAD by cath in 09/2018   Cataract    Depression    DJD (degenerative joint disease) of cervical spine 09/12/2016   Dysrhythmia    H. pylori infection 07/19/2017   History of cardiomyopathy    History of COVID-19 12/10/2019   History of tobacco use    Hyperlipidemia    Hypothyroidism 08/24/2022   Mid back pain 02/08/2017   Neuromuscular disorder    PAF (paroxysmal atrial fibrillation)    Diagnosis July 2017 - spontaneously resolved   Smell disturbance 06/13/2021   Statin intolerance     Past Surgical History:  Procedure Laterality Date   ABDOMINAL HYSTERECTOMY  1980    endometriosis   BIOPSY  07/18/2021   Procedure: BIOPSY;  Surgeon: Dolores Frame, MD;  [facility];  Service: Gastroenterology;;   CATARACT EXTRACTION Left 11/2018   COLONOSCOPY WITH PROPOFOL N/A 06/27/2022   Procedure: COLONOSCOPY WITH PROPOFOL;  Surgeon: Dolores Frame, MD;  [facility];  Service: Gastroenterology;  Laterality: N/A;  11:15   CORONARY PRESSURE/FFR STUDY N/A 06/01/2021   Procedure: INTRAVASCULAR PRESSURE WIRE/FFR STUDY;  Surgeon: Lyn Records, MD;  [facility];  Service: Cardiovascular;  Laterality: N/A;   ESOPHAGOGASTRODUODENOSCOPY (EGD) WITH PROPOFOL N/A 07/18/2021   Procedure: ESOPHAGOGASTRODUODENOSCOPY (EGD) WITH PROPOFOL;  Surgeon: Dolores Frame, MD;  [facility];  Service: Gastroenterology;  Laterality: N/A;  12:00   EYE SURGERY  cataracts  2021/2022   HEMOSTASIS CLIP PLACEMENT  06/27/2022   Procedure: HEMOSTASIS CLIP PLACEMENT;  Surgeon: Dolores Frame, MD;  [facility];  Service: Gastroenterology;;   LEFT HEART CATH AND CORONARY ANGIOGRAPHY N/A 09/25/2018   Procedure: LEFT HEART CATH AND CORONARY ANGIOGRAPHY;  Surgeon: Lyn Records, MD;  [facility];  Service: Cardiovascular;  Laterality: N/A;   LEFT HEART CATH AND CORONARY ANGIOGRAPHY N/A 06/01/2021   Procedure: LEFT HEART CATH AND CORONARY ANGIOGRAPHY;  Surgeon: Lyn Records, MD;  [facility];  Service: Cardiovascular;  Laterality: N/A;   POLYPECTOMY  06/27/2022   Procedure: POLYPECTOMY;  Surgeon: Marguerita Merles,  Reuel Boom, MD;  [facility];  Service: Gastroenterology;;   Gaspar Bidding DILATION  07/18/2021   Procedure: Gaspar Bidding DILATION;  Surgeon: Marguerita Merles, Reuel Boom, MD;  [facility];  Service: Gastroenterology;;   Brooke Dare INJECTION  06/27/2022   Procedure: SUBMUCOSAL LIFTING INJECTION;  Surgeon: Marguerita Merles, Reuel Boom, MD;   [facility];  Service: Gastroenterology;;   SUBMUCOSAL TATTOO INJECTION  06/27/2022   Procedure: SUBMUCOSAL TATTOO INJECTION;  Surgeon: Marguerita Merles, Reuel Boom, MD;  [facility];  Service: Gastroenterology;;   TONSILLECTOMY  1954    FAMHx:  Family History  Problem Relation Age of Onset   COPD Mother    Hearing loss Mother    Stroke Mother    Heart disease Mother        chf, died at 48   Vision loss Mother    Varicose Veins Mother    Arthritis Father    Hypertension Father    Heart disease Father        cad, pvd, died at 98   Vision loss Father    Heart disease Sister        heart valve   Arthritis Sister    Depression Sister    Hyperlipidemia Brother    Hypertension Brother    Cancer - Prostate Brother     SOCHx:   reports that she quit smoking about 14 years ago. Her smoking use included cigarettes. She started smoking about 57 years ago. She has a 40.00 pack-year smoking history. She has never used smokeless tobacco. She reports current alcohol use of about 2.0 standard drinks of alcohol per week. She reports current drug use. Drugs: Hydrocodone and Marijuana.  ALLERGIES:  Allergies  Allergen Reactions   Statins Other (See Comments)    Muscle weakness   Other     EKG pads causes blisters    Sulfa Antibiotics Rash    ROS: Pertinent items noted in HPI and remainder of comprehensive ROS otherwise negative.  HOME MEDS: Current Outpatient Medications on File Prior to Visit  Medication Sig Dispense Refill   ALPRAZolam (XANAX) 0.5 MG tablet Take 1-2 tablets 30 min prior to MRI. Can take additional tablet at time of test if needed. Must have driver to and from test. Can cause drowsiness. 3 tablet 0   buPROPion (WELLBUTRIN SR) 150 MG 12 hr tablet TAKE ONE TABLET BY MOUTH TWICE A DAY 180 tablet 1   Cholecalciferol (VITAMIN D3) 125 MCG (5000 UT) TABS Take 5,000 Units by mouth daily at 12 noon.     Coenzyme Q10 100 MG capsule Take 100 mg by mouth  daily at 12 noon.     diazepam (VALIUM) 5 MG tablet Take 1 tablet (5 mg total) by mouth daily as needed for anxiety or muscle spasms. 30 tablet 0   diclofenac Sodium (VOLTAREN) 1 % GEL Apply 4 g topically 4 (four) times daily. 150 g 0   docusate sodium (COLACE) 100 MG capsule Take 100 mg by mouth 2 (two) times daily.      estradiol (ESTRACE) 1 MG tablet Take 1 mg by mouth daily.     nitroGLYCERIN (NITROSTAT) 0.4 MG SL tablet Place 1 tablet under the tongue every 5 minutes as needed for chest pain. 25 tablet 1   oxyCODONE-acetaminophen (PERCOCET/ROXICET) 5-325 MG tablet TAKE 1 TABLET DAILY AS NEEDED FOR SEVERE PAIN. 30 tablet 0   progesterone (PROMETRIUM) 200 MG capsule Take 1 capsule (200 mg total) by mouth daily. 90 capsule 1  thyroid (NP THYROID) 30 MG tablet TAKE 2 TABLETS ONCE DAILY ON MONDAY, WEDNESDAY AND FRIDAY. TAKE 1 TABLET ONCE DAILY ON OTHER DAYS. 90 tablet 0   thyroid (NP THYROID) 60 MG tablet Take 1 tablet (60 mg total) by mouth daily before breakfast. 90 tablet 0   warfarin (COUMADIN) 5 MG tablet Take 1 tablet to 1 1/2 tablets daily as directed by coumadin clinic 135 tablet 1   No current facility-administered medications on file prior to visit.    LABS/IMAGING: No results found for this or any previous visit (from the past 48 hour(s)). No results found.  LIPID PANEL:    Component Value Date/Time   CHOL 366 (H) 08/23/2022 0905   TRIG 249 (H) 08/23/2022 0905   HDL 53 08/23/2022 0905   CHOLHDL 6.9 (H) 08/23/2022 0905   CHOLHDL 4.6 04/05/2021 1414   LDLCALC 260 (H) 08/23/2022 0905   LDLCALC 169 (H) 04/05/2021 1414    WEIGHTS: Wt Readings from Last 3 Encounters:  03/05/23 158 lb (71.7 kg)  11/26/22 160 lb (72.6 kg)  10/08/22 158 lb (71.7 kg)    VITALS: BP 128/74   Pulse (!) 59   Ht 5' (1.524 m)   Wt 158 lb (71.7 kg)   LMP 07/08/1979 (Approximate)   SpO2 100%   BMI 30.86 kg/m   EXAM: Deferred  EKG: Deferred  ASSESSMENT: Probable familial  hyperlipidemia, LDL greater than 190 based on Simon Broome criteria Statin intolerance-myalgias Mild to moderate and on obstructive coronary artery disease Family history of high cholesterol and heart disease  PLAN: 1.   Mrs. Gudgel has a probable familial hyperlipidemia with very high LDL cholesterol over 250.  She cannot tolerate statins in the past.  She has been on no lipid-lowering therapy for years and is known to have had at least mild to moderate nonobstructive coronary disease by prior cardiac catheterization.  She needs aggressive lipid-lowering.  Fortunately they have a Medicare Gap Coverage and may likely have fully covered insurance for Leqvio.  This is a good option providing twice yearly injections with 50% lowering of her lipids and few if any side effects.  We discussed this at length today.  She wants to read more about the medication but is interested in reaching out to start the prior authorization process.  Will plan a follow-up in about 6 months with repeat lipids including an NMR and LP(a) if she is approved and starts the medication.  Follow-up with me at that time.  Thanks again for the kind referral.  Chrystie Nose, MD, [facility]  [place]  [facility]  Medical Director of the Advanced Lipid Disorders &  Cardiovascular Risk Reduction Clinic Diplomate of the American Board of Clinical Lipidology Attending Cardiologist  Direct Dial: [phone]  Fax: [phone]  Website:  [email or web address] Nicely Logyn Kendrick 03/05/2023, 3:06 PM

## 2023-03-07 ENCOUNTER — Ambulatory Visit: Payer: Medicare Other | Admitting: Podiatry

## 2023-03-08 ENCOUNTER — Telehealth: Payer: Self-pay | Admitting: Internal Medicine

## 2023-03-08 NOTE — Telephone Encounter (Signed)
Patient seen in office 03/07/23 by Dr.Hilty.  Heather [name] was discussed as lipid treatment option  Benefits check/start form faxed to [phone]

## 2023-03-10 ENCOUNTER — Encounter: Payer: Self-pay | Admitting: Podiatry

## 2023-03-11 ENCOUNTER — Telehealth: Payer: Self-pay | Admitting: Podiatry

## 2023-03-11 ENCOUNTER — Other Ambulatory Visit: Payer: Self-pay | Admitting: Podiatry

## 2023-03-11 DIAGNOSIS — M722 Plantar fascial fibromatosis: Secondary | ICD-10-CM

## 2023-03-11 NOTE — Telephone Encounter (Signed)
[facility] availability[facility], pt would like to go to [facility] in [place] please

## 2023-03-12 NOTE — Telephone Encounter (Signed)
Faxed referral/notes/ demo to [facility] in [place], patient updated.

## 2023-03-13 ENCOUNTER — Ambulatory Visit: Payer: Medicare Other | Admitting: Podiatry

## 2023-03-13 NOTE — Telephone Encounter (Signed)
Patient has been identified as candidate for Leqvio  Benefits investigation enrollment completed on 03/08/23  Benefits investigation report notes the following:  Type of insurance: Medicare, Cigna Supplement Plan G  OOP Max: None / Met: N/A  Deductible: $240  Co-insurance: 20%  PA required: NO  PA phone number: N/A  Benefits Summary Details:  Medicare covers Leqvio at 80% after the patient meets the $240 calendar year deductible, which has been met  [place] Supplement Plan G covers the 20% coinsurance but NOT the deductibles.

## 2023-03-14 ENCOUNTER — Telehealth: Payer: Self-pay | Admitting: Pharmacy Technician

## 2023-03-14 ENCOUNTER — Encounter: Payer: Self-pay | Admitting: Internal Medicine

## 2023-03-14 NOTE — Telephone Encounter (Signed)
Heather [name], Patient will be scheduled as soon as possible. Medicare will cover 80% and supp plan will pick-up remaining 20%. Leqvio will be covered 100%

## 2023-03-14 NOTE — Telephone Encounter (Signed)
Auth Submission: NO AUTH NEEDED Site of care: Site of care: [facility] Payer: medicare a/b and cigna supp Medication & CPT/J Code(s) submitted: Leqvio (Inclisiran) [phone] Route of submission (phone, fax, portal):  Phone # Fax # Auth type: Buy/Bill Units/visits requested: 2 Reference number:  Approval from: 03/14/23 to 11/12/23

## 2023-03-14 NOTE — Addendum Note (Signed)
Addended by: Lindell Spar on: 03/14/2023 08:53 AM   Modules accepted: Orders

## 2023-03-18 ENCOUNTER — Encounter: Payer: Self-pay | Admitting: Neurology

## 2023-03-18 ENCOUNTER — Other Ambulatory Visit: Payer: Self-pay | Admitting: *Deleted

## 2023-03-18 MED ORDER — OXYCODONE-ACETAMINOPHEN 5-325 MG PO TABS: ORAL_TABLET | ORAL | 0 refills | Status: DC

## 2023-03-18 NOTE — Telephone Encounter (Signed)
Last seen 01/07/23 and next f/u 04/01/23. Last refilled rx 02/13/23 #30

## 2023-03-20 ENCOUNTER — Ambulatory Visit (INDEPENDENT_AMBULATORY_CARE_PROVIDER_SITE_OTHER): Payer: Medicare Other

## 2023-03-20 VITALS — BP 142/82 | HR 53 | Temp 97.4°F | Resp 18 | Ht 64.0 in

## 2023-03-20 DIAGNOSIS — E782 Mixed hyperlipidemia: Secondary | ICD-10-CM | POA: Diagnosis not present

## 2023-03-20 MED ORDER — INCLISIRAN SODIUM 284 MG/1.5ML ~~LOC~~ SOSY
284.0000 mg | PREFILLED_SYRINGE | Freq: Once | SUBCUTANEOUS | Status: AC
  Administered 2023-03-20: 284 mg via SUBCUTANEOUS
  Filled 2023-03-20: qty 1.5

## 2023-03-20 NOTE — Telephone Encounter (Signed)
1st Leqvio injection 03/20/23

## 2023-03-20 NOTE — Progress Notes (Signed)
Diagnosis: Hyperlipidemia  Provider:  Chilton Greathouse MD  Procedure: Injection  Leqvio (inclisiran), Dose: 284 mg, Site: subcutaneous, Number of injections: 1  Post Care: Observation period completed  Discharge: Condition: Good, Destination: Home . AVS Provided  Performed by:  Garnette Czech, RN

## 2023-03-25 ENCOUNTER — Ambulatory Visit: Payer: Medicare Other | Attending: Cardiology | Admitting: *Deleted

## 2023-03-25 DIAGNOSIS — Z5181 Encounter for therapeutic drug level monitoring: Secondary | ICD-10-CM

## 2023-03-25 DIAGNOSIS — I480 Paroxysmal atrial fibrillation: Secondary | ICD-10-CM

## 2023-03-25 LAB — POCT INR: INR: 2.1 (ref 2.0–3.0)

## 2023-03-25 NOTE — Patient Instructions (Signed)
Started Vegetarian diet Continue warfarin 1 1/2 tablets daily except 1 tablet on Sundays, Tuesdays and Thursdays Recheck INR in 4 wks.

## 2023-03-27 NOTE — Therapy (Signed)
OUTPATIENT PHYSICAL THERAPY LOWER EXTREMITY EVALUATION   Patient Name: Heather [name] MRN: [id number] DOB:09/18/1946, 77 y.o., female Today's Date: 03/29/2023  END OF SESSION:  PT End of Session - 03/29/23 1301     Visit Number 1    Number of Visits 8    Date for PT Re-Evaluation 04/26/23    Authorization Type Medicare part A and    PT Start Time 1302    PT Stop Time 1345    PT Time Calculation (min) 43 min    Activity Tolerance Patient tolerated treatment well;Patient limited by pain    Behavior During Therapy WFL for tasks assessed/performed             Past Medical History:  Diagnosis Date   Adrenal adenoma    Allergy    Anemia    Anxiety    CAD (coronary artery disease)    a. 50-60% mid LAD at cardiac catheterization 2014 [facility]) b. non-obstructive CAD by cath in 09/2018   Cataract    Depression    DJD (degenerative joint disease) of cervical spine 09/12/2016   Dysrhythmia    H. pylori infection 07/19/2017   History of cardiomyopathy    History of COVID-19 12/10/2019   History of tobacco use    Hyperlipidemia    Hypothyroidism 08/24/2022   Mid back pain 02/08/2017   Neuromuscular disorder (HCC)    PAF (paroxysmal atrial fibrillation) (HCC)    Diagnosis July 2017 - spontaneously resolved   Smell disturbance 06/13/2021   Statin intolerance    Past Surgical History:  Procedure Laterality Date   ABDOMINAL HYSTERECTOMY  1980   endometriosis   BIOPSY  07/18/2021   Procedure: BIOPSY;  Surgeon: Dolores Frame, MD;  [facility];  Service: Gastroenterology;;   CATARACT EXTRACTION Left 11/2018   COLONOSCOPY WITH PROPOFOL N/A 06/27/2022   Procedure: COLONOSCOPY WITH PROPOFOL;  Surgeon: Dolores Frame, MD;  [facility];  Service: Gastroenterology;  Laterality: N/A;  11:15   CORONARY PRESSURE/FFR STUDY N/A 06/01/2021   Procedure: INTRAVASCULAR PRESSURE WIRE/FFR STUDY;  Surgeon: Lyn Records, MD;  [facility];  Service: Cardiovascular;  Laterality: N/A;   ESOPHAGOGASTRODUODENOSCOPY (EGD) WITH PROPOFOL N/A 07/18/2021   Procedure: ESOPHAGOGASTRODUODENOSCOPY (EGD) WITH PROPOFOL;  Surgeon: Dolores Frame, MD;  [facility];  Service: Gastroenterology;  Laterality: N/A;  12:00   EYE SURGERY  cataracts  2021/2022   HEMOSTASIS CLIP PLACEMENT  06/27/2022   Procedure: HEMOSTASIS CLIP PLACEMENT;  Surgeon: Dolores Frame, MD;  [facility];  Service: Gastroenterology;;   LEFT HEART CATH AND CORONARY ANGIOGRAPHY N/A 09/25/2018   Procedure: LEFT HEART CATH AND CORONARY ANGIOGRAPHY;  Surgeon: Lyn Records, MD;  [facility];  Service: Cardiovascular;  Laterality: N/A;   LEFT HEART CATH AND CORONARY ANGIOGRAPHY N/A 06/01/2021   Procedure: LEFT HEART CATH AND CORONARY ANGIOGRAPHY;  Surgeon: Lyn Records, MD;  [facility];  Service: Cardiovascular;  Laterality: N/A;   POLYPECTOMY  06/27/2022   Procedure: POLYPECTOMY;  Surgeon: Dolores Frame, MD;  [facility];  Service: Gastroenterology;;   Gaspar Bidding DILATION  07/18/2021   Procedure: Gaspar Bidding DILATION;  Surgeon: Marguerita Merles, Reuel Boom, MD;  [facility];  Service: Gastroenterology;;   SUBMUCOSAL LIFTING INJECTION  06/27/2022   Procedure: SUBMUCOSAL LIFTING INJECTION;  Surgeon: Dolores Frame, MD;  [facility];  Service: Gastroenterology;;   SUBMUCOSAL TATTOO INJECTION  06/27/2022  Procedure: SUBMUCOSAL TATTOO INJECTION;  Surgeon: Marguerita Merles, Reuel Boom, MD;  [facility];  Service: Gastroenterology;;   TONSILLECTOMY  1954   Patient Active Problem List   Diagnosis Date Noted   Cerebral atrophy (HCC) 02/13/2023   Foot and ankle pain 09/26/2022   Hypothyroidism 08/24/2022   Cyst of buttocks 05/04/2022   DOE (dyspnea on exertion) 10/09/2021   Post-COVID syndrome 07/31/2021   Dysphagia 07/10/2021    Tubular adenoma of colon 07/19/2017   Hx of colonic polyps 12/03/2016   Family history of colon cancer 12/03/2016   Encounter for therapeutic drug monitoring 11/21/2016   CAD (coronary artery disease) 09/12/2016   PAF (paroxysmal atrial fibrillation) (HCC) 09/12/2016   DJD (degenerative joint disease) of cervical spine 09/12/2016   Statin intolerance 09/12/2016   TIA (transient ischemic attack) 09/12/2016   Diverticulitis of colon 09/12/2016   Myofascial pain syndrome 09/06/2016   Takotsubo syndrome 09/06/2016   HLD (hyperlipidemia) 09/06/2016   GAD (generalized anxiety disorder) 09/06/2016   Recurrent UTI (urinary tract infection) 09/06/2016    PCP: Trena Platt, MD  REFERRING PROVIDER: Nicholes Rough, DPM  REFERRING DIAG: M72.2 (ICD-10-CM) - Plantar fasciitis of right foot M72.2 (ICD-10-CM) - Plantar fasciitis, left  THERAPY DIAG:  Difficulty in walking, not elsewhere classified  Plantar fasciitis, bilateral  Rationale for Evaluation and Treatment: Rehabilitation  ONSET DATE: since August 2023  SUBJECTIVE:   SUBJECTIVE STATEMENT: Pain started in August of 2023; she and her husband had started a walking program and was wearing old footwear; then in Sept did a lot of walking at a conference and that caused it to worsen.   Started a job in November at [employer]; she started having pain from prolonged standing/walking.  Has been using ice and wearing insoles and bought new shoes (Hokas and Brooks) and stretching but pain increases with working. Decreases when she has time off.  Started seeing a podiatrist who has given her 2 injections (got them in Feb of this year) helped temporarily; maybe a couple weeks.  Wearing a brace sometimes with foot support. No pain at rest; increased pain with standing, walking; patient asks about ultrasound as she heard from friends this was beneficial.  PERTINENT HISTORY:  PAIN:  Are you having pain? Yes: NPRS scale: 0-10/10 Pain location: bottom  of the feet, heels Pain description: burning, tender Aggravating factors: standing, walking Relieving factors: rest  PRECAUTIONS: None  WEIGHT BEARING RESTRICTIONS: No  FALLS:  Has patient fallen in last 6 months? No  OCCUPATION: librarian  PLOF: Independent  PATIENT GOALS: decreased pain  NEXT MD VISIT: unknown  OBJECTIVE:   DIAGNOSTIC FINDINGS:  Narrative & Impression  CLINICAL DATA:  Right ankle pain and swelling, no known injury   EXAM: RIGHT ANKLE - COMPLETE 3+ VIEW   COMPARISON:  None available   FINDINGS: Osseous mineralization normal.   Joint spaces preserved.   No acute fracture, dislocation, or bone destruction.   IMPRESSION: No acute abnormalities.      PATIENT SURVEYS:  FOTO 54  COGNITION: Overall cognitive status: Within functional limits for tasks assessed     SENSATION: Burning; sometimes feels numb ball of foot  EDEMA:  No swelling in feet    POSTURE: No Significant postural limitations  PALPATION: Tender bilateral heels; laterally > medially and right > left at origin of plantar fascia; noted swelling right plantar aspect of foot  LOWER EXTREMITY ROM:  Active ROM Right eval Left eval  Hip flexion    Hip extension  Hip abduction    Hip adduction    Hip internal rotation    Hip external rotation    Knee flexion    Knee extension    Ankle dorsiflexion 14 14  Ankle plantarflexion 40* 60  Ankle inversion 25 20  Ankle eversion 4 6   (Blank rows = not tested)  LOWER EXTREMITY MMT:  MMT Right eval Left eval  Hip flexion    Hip extension    Hip abduction    Hip adduction    Hip internal rotation    Hip external rotation    Knee flexion (sitting) 4+ 4+  Knee extension 4+ 5  Ankle dorsiflexion 4+ 4+  Ankle plantarflexion 3-(painful) 3-  Ankle inversion 4+ 4+  Ankle eversion 4+ 4+   (Blank rows = not tested)   FUNCTIONAL TESTS:  5 times sit to stand: 14.54 2 minute walk test: 235 ft unable to finish 2 min  walk due to pain  GAIT: Distance walked: 235 ft Assistive device utilized: None Level of assistance: Modified independence Comments: antalgic gait; increasingly so as time progresses; decreased heel strike and toe off; valgus knees   TODAY'S TREATMENT:                                                                                                                              DATE: 03/29/2023 physical therapy evaluation and HEP instruction   PATIENT EDUCATION:  Education details: Patient educated on exam findings, POC, scope of PT, HEP, and what to expect next visit. Person educated: Patient Education method: Explanation, Demonstration, and Handouts Education comprehension: verbalized understanding, returned demonstration, verbal cues required, and tactile cues required   HOME EXERCISE PROGRAM: Access Code: [email or web address] URL: [email or web address] Date: 03/29/2023 Prepared by: [facility]  Exercises - Seated Plantar Fascia Mobilization with Small Ball  - 2 x daily - 7 x weekly - 1 sets - 10 reps - Long Sitting Plantar Fascia Stretch with Towel  - 2 x daily - 7 x weekly - 1 sets - 5 reps - 30 sec hold - Plantar Fascia Stretch on Step  - 2 x daily - 7 x weekly - 1 sets - 5 reps - 20 sec hold  ASSESSMENT:  CLINICAL IMPRESSION: Patient is a 77 y.o. female who was seen today for physical therapy evaluation and treatment for bilateral plantar fascitis. Patient demonstrates muscle weakness, reduced ROM, and fascial restrictions which are likely contributing to symptoms of pain and are negatively impacting patient ability to perform ADLs and functional mobility tasks. Patient will benefit from skilled physical therapy services to address these deficits to reduce pain and improve level of function with ADLs and functional mobility tasks.   OBJECTIVE IMPAIRMENTS: Abnormal gait, decreased activity tolerance, decreased endurance, decreased mobility, difficulty walking, decreased ROM,  decreased strength, hypomobility, increased edema, increased fascial restrictions, impaired perceived functional ability, impaired flexibility, and pain.   ACTIVITY LIMITATIONS: standing, squatting, sleeping, stairs, and locomotion level  PARTICIPATION  LIMITATIONS: meal prep, cleaning, shopping, community activity, and occupation  REHAB POTENTIAL: Good  CLINICAL DECISION MAKING: Stable/uncomplicated  EVALUATION COMPLEXITY: Low   GOALS: Goals reviewed with patient? No  SHORT TERM GOALS: Target date: 04/12/2023 patient will be independent with initial HEP  Baseline: Goal status: INITIAL  2.  Patient will self report 30% improvement to improve tolerance for functional activity  Baseline:  Goal status: INITIAL  LONG TERM GOALS: Target date: 04/26/2023  Patient will be independent in self management strategies to improve quality of life and functional outcomes.  Baseline:  Goal status: INITIAL  2.  Patient will self report 50% improvement to improve tolerance for functional activity  Baseline:  Goal status: INITIAL  3.  Patient will improve FOTO score by 10 points to demonstrate improved perceived functional mobility  Baseline: 54 Goal status: INITIAL  4.  Patient will increase distance on to 300 ft to demonstrate improved functional mobility walking household and community distances.   Baseline: 235 ft Goal status: INITIAL  5.  Patient will be able to stand and walk for work x 1 hours without pain > 2/10 both feet Baseline:  Goal status: INITIAL  6.  Patient will increase bilateral leg MMTs to 4+ to 5/5 without pain to promote return to ambulation community distances with minimal deviation.  Baseline:  Goal status: INITIAL   PLAN:  PT FREQUENCY: 2x/week  PT DURATION: 4 weeks  PLANNED INTERVENTIONS: Therapeutic exercises, Therapeutic activity, Neuromuscular re-education, Balance training, Gait training, Patient/Family education, Joint manipulation,  Joint mobilization, Stair training, Orthotic/Fit training, DME instructions, Aquatic Therapy, Dry Needling, Electrical stimulation, Spinal manipulation, Spinal mobilization, Cryotherapy, Moist heat, Compression bandaging, scar mobilization, Splintting, Taping, Traction, Ultrasound, Ionotophoresis 4mg /ml Dexamethasone, and Manual therapy   PLAN FOR NEXT SESSION: patient wants to try dry needling; issued information at evaluation; review HEP and goals; check hip abductor strength   2:10 PM, 03/29/23 Melany Wiesman Small Adeli Frost MPT [place] [facility] [place] [phone] [phone]

## 2023-03-28 ENCOUNTER — Ambulatory Visit (HOSPITAL_COMMUNITY): Payer: Medicare Other | Admitting: Physical Therapy

## 2023-03-29 ENCOUNTER — Ambulatory Visit (HOSPITAL_COMMUNITY): Payer: Medicare Other | Attending: Podiatry

## 2023-03-29 ENCOUNTER — Other Ambulatory Visit: Payer: Self-pay

## 2023-03-29 DIAGNOSIS — M722 Plantar fascial fibromatosis: Secondary | ICD-10-CM | POA: Insufficient documentation

## 2023-03-29 DIAGNOSIS — R262 Difficulty in walking, not elsewhere classified: Secondary | ICD-10-CM | POA: Insufficient documentation

## 2023-03-29 NOTE — Patient Instructions (Signed)

## 2023-04-01 ENCOUNTER — Ambulatory Visit (HOSPITAL_COMMUNITY): Payer: Medicare Other | Admitting: Physical Therapy

## 2023-04-01 ENCOUNTER — Encounter (HOSPITAL_COMMUNITY): Payer: Self-pay | Admitting: Physical Therapy

## 2023-04-01 ENCOUNTER — Ambulatory Visit (INDEPENDENT_AMBULATORY_CARE_PROVIDER_SITE_OTHER): Payer: Medicare Other | Admitting: Neurology

## 2023-04-01 DIAGNOSIS — M549 Dorsalgia, unspecified: Secondary | ICD-10-CM | POA: Diagnosis not present

## 2023-04-01 DIAGNOSIS — R439 Unspecified disturbances of smell and taste: Secondary | ICD-10-CM

## 2023-04-01 DIAGNOSIS — R419 Unspecified symptoms and signs involving cognitive functions and awareness: Secondary | ICD-10-CM

## 2023-04-01 DIAGNOSIS — M722 Plantar fascial fibromatosis: Secondary | ICD-10-CM

## 2023-04-01 DIAGNOSIS — R262 Difficulty in walking, not elsewhere classified: Secondary | ICD-10-CM | POA: Diagnosis not present

## 2023-04-01 DIAGNOSIS — M7918 Myalgia, other site: Secondary | ICD-10-CM

## 2023-04-01 DIAGNOSIS — M62838 Other muscle spasm: Secondary | ICD-10-CM | POA: Diagnosis not present

## 2023-04-01 NOTE — Progress Notes (Addendum)
[facility]  PATIENT: Heather [name] DOB: [birth year]  REFERRING DOCTOR OR PCP:  Rica Mast SOURCE: patient  _________________________________   HISTORICAL  CHIEF COMPLAINT:  Chief Complaint  Patient presents with   Room 1    Pt is here with her Husband. Pt states she hasn't taken any blood thinners today.     HISTORY OF PRESENT ILLNESS:  Heather [name] is a 77 y.o. woman who has had chronic right-sided mid back pain since 2013.      Update 04/01/2023 She had noted some mild cognitive issues in 2022.  MRI of the brain showed atrophy.  Over the past few months she is noting more issues with word finding making multiple errors a day.  She was upset after the last visit where we went over some possible causes of her issues.  We had a very long discussion about this.  Of note, although she notes difficulties with speech her husband and others who know her well have not noted.  I discussed with her that the majority of the time when an individual notes cognitive problems more than those around them that this is due to hypervigilance that might be related to anxiety or depression.  She does have some atrophy.  I cannot rule out a neurodegenerative process..  Of note, we repeated the MRI after her last visit and the extent of atrophy is similar compared to the previous MRI.  There is no lobar predominant pattern.    She also has noted an abnormal odor that others do not spell at times.  This is not associated with other symptoms when it occurs  Her myofascial pain in the right trunk has done well since the last Botox injection.   When she has more of the myofascial pain pain, she will take half of a Percocet.  If she notes more spasticity she will take half of Valium.  Prescriptions have lasted multiple months.  Besides working part-time, she notes being fairly active and does gardening.  Most nights she sleeps well but she has rare insomnia when pain is worse and will  rarely take diazepam (usually just one or two a month).  She never combines with a percocet.    She is on coumadin for atrial fibrillation.        IMAGING:  MRI results from 10/14/2013: The MRI of the brain showed age related atrophy and minimal chronic microvascular ischemic change. MRI of the cervical spine showed multilevel mild degenerative changes with left paramedian disc herniation at C3-C4 and left disc protrusion at C4-C5 and C5-C6 and midline disc herniation at C6-C7. There was no report of nerve root compression. MRI of the thoracic spine showed disc desiccation but no herniation or protrusions. MRI of the lumbar spine showed disc bulges at T12-L1 and L2-L3 and disc bulge with facet hypertrophy at L3-L4 and disc bulge with right foraminal annular tear at L4-L5.  MRI brain 7/4/2-22 was personally reviewed.  It shows mild generalized atrophy.  No acute findings.     REVIEW OF SYSTEMS: Constitutional: No fevers, chills, sweats, or change in appetite Eyes: No visual changes, double vision, eye pain Ear, nose and throat: No hearing loss, ear pain, nasal congestion, sore throat Cardiovascular: No chest pain, palpitations.   She has atrial fibrillation and is on warfarin Respiratory:  No shortness of breath at rest or with exertion.   No wheezes GastrointestinaI: No nausea, vomiting, diarrhea, abdominal pain, fecal incontinence Genitourinary:  No dysuria, urinary retention or frequency.  No nocturia. Musculoskeletal:  No neck pain.  She has back pain/thoracic pain as above Integumentary: No rash, pruritus, skin lesions Neurological: as above Psychiatric: No depression at this time.  No anxiety Endocrine: No palpitations, diaphoresis, change in appetite, change in weigh or increased thirst Hematologic/Lymphatic:  No anemia, purpura, petechiae. Allergic/Immunologic: No itchy/runny eyes, nasal congestion, recent allergic reactions, rashes  ALLERGIES: Allergies  Allergen Reactions    Statins Other (See Comments)    Muscle weakness   Other     EKG pads causes blisters    Sulfa Antibiotics Rash    HOME MEDICATIONS:  Current Outpatient Medications:    ALPRAZolam (XANAX) 0.5 MG tablet, Take 1-2 tablets 30 min prior to MRI. Can take additional tablet at time of test if needed. Must have driver to and from test. Can cause drowsiness., Disp: 3 tablet, Rfl: 0   buPROPion (WELLBUTRIN SR) 150 MG 12 hr tablet, TAKE ONE TABLET BY MOUTH TWICE A DAY, Disp: 180 tablet, Rfl: 1   Cholecalciferol (VITAMIN D3) 125 MCG (5000 UT) TABS, Take 5,000 Units by mouth daily at 12 noon., Disp: , Rfl:    Coenzyme Q10 100 MG capsule, Take 100 mg by mouth daily at 12 noon., Disp: , Rfl:    diazepam (VALIUM) 5 MG tablet, Take 1 tablet (5 mg total) by mouth daily as needed for anxiety or muscle spasms., Disp: 30 tablet, Rfl: 0   docusate sodium (COLACE) 100 MG capsule, Take 100 mg by mouth 2 (two) times daily. , Disp: , Rfl:    estradiol (ESTRACE) 1 MG tablet, Take 1 mg by mouth daily., Disp: , Rfl:    inclisiran (LEQVIO) 284 MG/1.5ML SOSY injection, Inject 284 mg into the skin once., Disp: , Rfl:    nitroGLYCERIN (NITROSTAT) 0.4 MG SL tablet, Place 1 tablet under the tongue every 5 minutes as needed for chest pain., Disp: 25 tablet, Rfl: 1   oxyCODONE-acetaminophen (PERCOCET/ROXICET) 5-325 MG tablet, TAKE 1 TABLET DAILY AS NEEDED FOR SEVERE PAIN., Disp: 30 tablet, Rfl: 0   progesterone (PROMETRIUM) 200 MG capsule, Take 1 capsule (200 mg total) by mouth daily., Disp: 90 capsule, Rfl: 1   warfarin (COUMADIN) 5 MG tablet, Take 1 tablet to 1 1/2 tablets daily as directed by coumadin clinic, Disp: 135 tablet, Rfl: 1   thyroid (NP THYROID) 60 MG tablet, Take 1 tablet (60 mg total) by mouth daily before breakfast. (Patient not taking: Reported on 04/01/2023), Disp: 90 tablet, Rfl: 0  PAST MEDICAL HISTORY: Past Medical History:  Diagnosis Date   Adrenal adenoma    Allergy    Anemia    Anxiety    CAD  (coronary artery disease)    a. 50-60% mid LAD at cardiac catheterization 2014 [facility]) b. non-obstructive CAD by cath in 09/2018   Cataract    Depression    DJD (degenerative joint disease) of cervical spine 09/12/2016   Dysrhythmia    H. pylori infection 07/19/2017   History of cardiomyopathy    History of COVID-19 12/10/2019   History of tobacco use    Hyperlipidemia    Hypothyroidism 08/24/2022   Mid back pain 02/08/2017   Neuromuscular disorder (HCC)    PAF (paroxysmal atrial fibrillation) (HCC)    Diagnosis July 2017 - spontaneously resolved   Smell disturbance 06/13/2021   Statin intolerance     PAST SURGICAL HISTORY: Past Surgical History:  Procedure Laterality Date   ABDOMINAL HYSTERECTOMY  1980   endometriosis   BIOPSY  07/18/2021   Procedure: BIOPSY;  Surgeon: Marguerita Merles, Reuel Boom, MD;  [facility]: [facility];  Service: Gastroenterology;;   CATARACT EXTRACTION Left 11/2018   COLONOSCOPY WITH PROPOFOL N/A 06/27/2022   Procedure: COLONOSCOPY WITH PROPOFOL;  Surgeon: Dolores Frame, MD;  Location: [facility];  Service: Gastroenterology;  Laterality: N/A;  11:15   CORONARY PRESSURE/FFR STUDY N/A 06/01/2021   Procedure: INTRAVASCULAR PRESSURE WIRE/FFR STUDY;  Surgeon: Lyn Records, MD;  [facility]: [facility];  Service: Cardiovascular;  Laterality: N/A;   ESOPHAGOGASTRODUODENOSCOPY (EGD) WITH PROPOFOL N/A 07/18/2021   Procedure: ESOPHAGOGASTRODUODENOSCOPY (EGD) WITH PROPOFOL;  Surgeon: Dolores Frame, MD;  [facility]: [facility];  Service: Gastroenterology;  Laterality: N/A;  12:00   EYE SURGERY  cataracts  2021/2022   HEMOSTASIS CLIP PLACEMENT  06/27/2022   Procedure: HEMOSTASIS CLIP PLACEMENT;  Surgeon: Dolores Frame, MD;  [facility]: [facility];  Service: Gastroenterology;;   LEFT HEART CATH AND CORONARY ANGIOGRAPHY N/A 09/25/2018   Procedure: LEFT HEART CATH AND CORONARY ANGIOGRAPHY;  Surgeon: Lyn Records,  MD;  [facility]: [facility];  Service: Cardiovascular;  Laterality: N/A;   LEFT HEART CATH AND CORONARY ANGIOGRAPHY N/A 06/01/2021   Procedure: LEFT HEART CATH AND CORONARY ANGIOGRAPHY;  Surgeon: Lyn Records, MD;  [facility]: [facility];  Service: Cardiovascular;  Laterality: N/A;   POLYPECTOMY  06/27/2022   Procedure: POLYPECTOMY;  Surgeon: Dolores Frame, MD;  [facility];  Service: Gastroenterology;;   Gaspar Bidding DILATION  07/18/2021   Procedure: Gaspar Bidding DILATION;  Surgeon: Marguerita Merles, Reuel Boom, MD;  [facility];  Service: Gastroenterology;;   SUBMUCOSAL LIFTING INJECTION  06/27/2022   Procedure: SUBMUCOSAL LIFTING INJECTION;  Surgeon: Marguerita Merles, Reuel Boom, MD;  [facility];  Service: Gastroenterology;;   SUBMUCOSAL TATTOO INJECTION  06/27/2022   Procedure: SUBMUCOSAL TATTOO INJECTION;  Surgeon: Marguerita Merles, Reuel Boom, MD;  [facility];  Service: Gastroenterology;;   TONSILLECTOMY  1954    FAMILY HISTORY: Family History  Problem Relation Age of Onset   COPD Mother    Hearing loss Mother    Stroke Mother    Heart disease Mother        chf, died at 66   Vision loss Mother    Varicose Veins Mother    Arthritis Father    Hypertension Father    Heart disease Father        cad, pvd, died at 12   Vision loss Father    Heart disease Sister        heart valve   Arthritis Sister    Depression Sister    Hyperlipidemia Brother    Hypertension Brother    Cancer - Prostate Brother     SOCIAL HISTORY:  Social History   Socioeconomic History   Marital status: Married    Spouse name: Al   Number of children: 0   Years of education: 18   Highest education level: Master's degree (e.g., MA, MS, MEng, MEd, MSW, MBA)  Occupational History   Occupation: Clinical research associate    Comment: from home  Tobacco Use   Smoking status: Former    Packs/day: 1.00    Years: 40.00    Additional pack years: 0.00    Total pack  years: 40.00    Types: Cigarettes    Start date: 11/12/1965    Quit date: 09/06/2008    Years since quitting: 14.5   Smokeless tobacco: Never  Vaping Use   Vaping Use: Never used  Substance  and Sexual Activity   Alcohol use: Yes    Alcohol/week: 2.0 standard drinks of alcohol    Types: 1 Glasses of wine, 1 Shots of liquor per week    Comment: occasional   Drug use: Yes    Types: Hydrocodone, Marijuana    Comment: prescribed Percocet for myofacial pain syndrome   Sexual activity: Yes    Birth control/protection: Post-menopausal  Other Topics Concern   Not on file  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   Lives with husband AL   No children   Healthy diet and lifestyle   Social Determinants of Health   Financial Resource Strain: Low Risk  (02/09/2023)   Overall Financial Resource Strain (CARDIA)    Difficulty of Paying Living Expenses: Not very hard  Food Insecurity: No Food Insecurity (02/09/2023)   Hunger Vital Sign    Worried About Running Out of Food in the Last Year: Never true    Ran Out of Food in the Last Year: Never true  Transportation Needs: No Transportation Needs (02/09/2023)   PRAPARE - Administrator, Civil Service (Medical): No    Lack of Transportation (Non-Medical): No  Physical Activity: Insufficiently Active (02/09/2023)   Exercise Vital Sign    Days of Exercise per Week: 3 days    Minutes of Exercise per Session: 30 min  Stress: Stress Concern Present (02/09/2023)   [employer] of Occupational Health - Occupational Stress Questionnaire    Feeling of Stress : To some extent  Social Connections: Moderately Integrated (02/09/2023)   Social Connection and Isolation Panel [NHANES]    Frequency of Communication with Friends and Family: More than three times a week    Frequency of Social Gatherings with Friends and Family: Twice a week    Attends Religious Services: Never    Database administrator or Organizations: Yes    Attends Tax inspector Meetings: 1 to 4 times per year    Marital Status: Married  Catering manager Violence: Not At Risk (09/13/2021)   Humiliation, Afraid, Rape, and Kick questionnaire    Fear of Current or Ex-Partner: No    Emotionally Abused: No    Physically Abused: No    Sexually Abused: No     PHYSICAL EXAM   General: The patient is well-developed and well-nourished and in no acute distress.  Head is normocephalic and atraumatic.   Musculoskeletal : There is tenderness in the mid to lower thoracic intercoastal muscles from T6-T9 on the right .  The neck was nontender today.  Neurologic Exam  Mental status: The patient is alert and oriented x 3 at the time of the examination.   Focus and attention seem normal.   I did not notice significant word finding difficulty today though she did pause twice to come up with the right word.  Cranial nerves: Extraocular muscles are intact.  Facial strength is normal.   . No obvious hearing deficits are noted.  Motor: She has normal muscle tone, muscle bulk and muscle strength in the arms or legs.  gait and station: Gait and station are normal.  The Romberg is negative    ASSESSMENT AND PLAN  1. Cognitive complaints   2. Myofascial pain syndrome   3. Mid back pain   4. Smell disturbance   5. Muscle spasm         1.  Inject 100 units of Botox using 30-gauge needle split into tender points below the T6-T9 ribs into  the intercostal muscles near the midclavicular line.  There were no complications and she tolerated the injections well.   2.   The etiology of her perceived language dysfunction is not apparent.  It is possible that this represents hypervigilance as others have not noted any issues.  MRI of the brain did not show any further atrophy compared to 2022.  She is scheduled to get neurocognitive testing.   3.  She should continue to stay active and exercise as tolerated.  Can take rare Percocet as needed for days when there is more  intense pain.  If severe spasms take Valium  4.   She will return in 3-4 months for next Botox or sooner if  new or worsening neurologic symptoms.  40-minute office visit with the majority of the time spent face-to-face for history and physical, discussion/counseling and decision-making.  Additional time with record review and documentation.   Trek Kimball A. Epimenio Foot, MD, PhD 04/02/2023, 12:46 PM Certified in Neurology, Clinical Neurophysiology, Sleep Medicine, Pain Medicine and Neuroimaging  [facility] [address] [place], [place] [address] [phone]

## 2023-04-01 NOTE — Therapy (Signed)
OUTPATIENT PHYSICAL THERAPY TREATMENT   Patient Name: Heather [name] MRN: [id number] DOB:[birth year], 77 y.o., female Today's Date: 04/01/2023  END OF SESSION:  PT End of Session - 04/01/23 1033     Visit Number 2    Number of Visits 8    Date for PT Re-Evaluation 04/26/23    Authorization Type Medicare part A and    PT Start Time 1032    PT Stop Time 1117    PT Time Calculation (min) 45 min    Activity Tolerance Patient tolerated treatment well;Patient limited by pain    Behavior During Therapy WFL for tasks assessed/performed             Past Medical History:  Diagnosis Date   Adrenal adenoma    Allergy    Anemia    Anxiety    CAD (coronary artery disease)    a. 50-60% mid LAD at cardiac catheterization 2014 [facility]) b. non-obstructive CAD by cath in 09/2018   Cataract    Depression    DJD (degenerative joint disease) of cervical spine 09/12/2016   Dysrhythmia    H. pylori infection 07/19/2017   History of cardiomyopathy    History of COVID-19 12/10/2019   History of tobacco use    Hyperlipidemia    Hypothyroidism 08/24/2022   Mid back pain 02/08/2017   Neuromuscular disorder (HCC)    PAF (paroxysmal atrial fibrillation) (HCC)    Diagnosis July 2017 - spontaneously resolved   Smell disturbance 06/13/2021   Statin intolerance    Past Surgical History:  Procedure Laterality Date   ABDOMINAL HYSTERECTOMY  1980   endometriosis   BIOPSY  07/18/2021   Procedure: BIOPSY;  Surgeon: Dolores Frame, MD;  [facility]: [facility];  Service: Gastroenterology;;   CATARACT EXTRACTION Left 11/2018   COLONOSCOPY WITH PROPOFOL N/A 06/27/2022   Procedure: COLONOSCOPY WITH PROPOFOL;  Surgeon: Dolores Frame, MD;  Location: [facility];  Service: Gastroenterology;  Laterality: N/A;  11:15   CORONARY PRESSURE/FFR STUDY N/A 06/01/2021   Procedure: INTRAVASCULAR PRESSURE WIRE/FFR STUDY;  Surgeon: Lyn Records, MD;  [facility]: [facility];   Service: Cardiovascular;  Laterality: N/A;   ESOPHAGOGASTRODUODENOSCOPY (EGD) WITH PROPOFOL N/A 07/18/2021   Procedure: ESOPHAGOGASTRODUODENOSCOPY (EGD) WITH PROPOFOL;  Surgeon: Dolores Frame, MD;  [facility]: [facility];  Service: Gastroenterology;  Laterality: N/A;  12:00   EYE SURGERY  cataracts  2021/2022   HEMOSTASIS CLIP PLACEMENT  06/27/2022   Procedure: HEMOSTASIS CLIP PLACEMENT;  Surgeon: Dolores Frame, MD;  [facility]: [facility];  Service: Gastroenterology;;   LEFT HEART CATH AND CORONARY ANGIOGRAPHY N/A 09/25/2018   Procedure: LEFT HEART CATH AND CORONARY ANGIOGRAPHY;  Surgeon: Lyn Records, MD;  [facility]: [facility];  Service: Cardiovascular;  Laterality: N/A;   LEFT HEART CATH AND CORONARY ANGIOGRAPHY N/A 06/01/2021   Procedure: LEFT HEART CATH AND CORONARY ANGIOGRAPHY;  Surgeon: Lyn Records, MD;  [facility]: [facility];  Service: Cardiovascular;  Laterality: N/A;   POLYPECTOMY  06/27/2022   Procedure: POLYPECTOMY;  Surgeon: Dolores Frame, MD;  [facility];  Service: Gastroenterology;;   Gaspar Bidding DILATION  07/18/2021   Procedure: Gaspar Bidding DILATION;  Surgeon: Marguerita Merles, Reuel Boom, MD;  [facility]: [facility];  Service: Gastroenterology;;   SUBMUCOSAL LIFTING INJECTION  06/27/2022   Procedure: SUBMUCOSAL LIFTING INJECTION;  Surgeon: Dolores Frame, MD;  [facility];  Service: Gastroenterology;;   SUBMUCOSAL TATTOO INJECTION  06/27/2022   Procedure:  SUBMUCOSAL TATTOO INJECTION;  Surgeon: Marguerita Merles, Reuel Boom, MD;  [facility]: [facility];  Service: Gastroenterology;;   TONSILLECTOMY  1954   Patient Active Problem List   Diagnosis Date Noted   Cerebral atrophy (HCC) 02/13/2023   Foot and ankle pain 09/26/2022   Hypothyroidism 08/24/2022   Cyst of buttocks 05/04/2022   DOE (dyspnea on exertion) 10/09/2021   Post-COVID syndrome 07/31/2021   Dysphagia 07/10/2021   Tubular adenoma of  colon 07/19/2017   Hx of colonic polyps 12/03/2016   Family history of colon cancer 12/03/2016   Encounter for therapeutic drug monitoring 11/21/2016   CAD (coronary artery disease) 09/12/2016   PAF (paroxysmal atrial fibrillation) (HCC) 09/12/2016   DJD (degenerative joint disease) of cervical spine 09/12/2016   Statin intolerance 09/12/2016   TIA (transient ischemic attack) 09/12/2016   Diverticulitis of colon 09/12/2016   Myofascial pain syndrome 09/06/2016   Takotsubo syndrome 09/06/2016   HLD (hyperlipidemia) 09/06/2016   GAD (generalized anxiety disorder) 09/06/2016   Recurrent UTI (urinary tract infection) 09/06/2016    PCP: Trena Platt, MD  REFERRING PROVIDER: Nicholes Rough, DPM  REFERRING DIAG: M72.2 (ICD-10-CM) - Plantar fasciitis of right foot M72.2 (ICD-10-CM) - Plantar fasciitis, left  THERAPY DIAG:  Difficulty in walking, not elsewhere classified  Plantar fasciitis, bilateral  Rationale for Evaluation and Treatment: Rehabilitation  ONSET DATE: since August 2023  SUBJECTIVE:   SUBJECTIVE STATEMENT: Patient states she has been doing HEP. She has trouble with toe stretch.    EVAL: Pain started in August of 2023; she and her husband had started a walking program and was wearing old footwear; then in Sept did a lot of walking at a conference and that caused it to worsen.   Started a job in November at [employer]; she started having pain from prolonged standing/walking.  Has been using ice and wearing insoles and bought new shoes (Hokas and Brooks) and stretching but pain increases with working. Decreases when she has time off.  Started seeing a podiatrist who has given her 2 injections (got them in Feb of this year) helped temporarily; maybe a couple weeks.  Wearing a brace sometimes with foot support. No pain at rest; increased pain with standing, walking; patient asks about ultrasound as she heard from friends this was beneficial.  PERTINENT HISTORY:  PAIN:  Are  you having pain? Yes: NPRS scale: 0-10/10 Pain location: bottom of the feet, heels Pain description: burning, tender Aggravating factors: standing, walking Relieving factors: rest  PRECAUTIONS: None  WEIGHT BEARING RESTRICTIONS: No  FALLS:  Has patient fallen in last 6 months? No  OCCUPATION: librarian  PLOF: Independent  PATIENT GOALS: decreased pain  NEXT MD VISIT: unknown  OBJECTIVE:   DIAGNOSTIC FINDINGS:  Narrative & Impression  CLINICAL DATA:  Right ankle pain and swelling, no known injury   EXAM: RIGHT ANKLE - COMPLETE 3+ VIEW   COMPARISON:  None available   FINDINGS: Osseous mineralization normal.   Joint spaces preserved.   No acute fracture, dislocation, or bone destruction.   IMPRESSION: No acute abnormalities.      PATIENT SURVEYS:  FOTO 54  COGNITION: Overall cognitive status: Within functional limits for tasks assessed     SENSATION: Burning; sometimes feels numb ball of foot  EDEMA:  No swelling in feet    POSTURE: No Significant postural limitations  PALPATION: Tender bilateral heels; laterally > medially and right > left at origin of plantar fascia; noted swelling right plantar aspect of foot  LOWER EXTREMITY ROM:  Active ROM Right eval Left eval  Hip flexion    Hip extension    Hip abduction    Hip adduction    Hip internal rotation    Hip external rotation    Knee flexion    Knee extension    Ankle dorsiflexion 14 14  Ankle plantarflexion 40* 60  Ankle inversion 25 20  Ankle eversion 4 6   (Blank rows = not tested)  LOWER EXTREMITY MMT:  MMT Right eval Left eval  Hip flexion    Hip extension    Hip abduction    Hip adduction    Hip internal rotation    Hip external rotation    Knee flexion (sitting) 4+ 4+  Knee extension 4+ 5  Ankle dorsiflexion 4+ 4+  Ankle plantarflexion 3-(painful) 3-  Ankle inversion 4+ 4+  Ankle eversion 4+ 4+   (Blank rows = not tested)   FUNCTIONAL TESTS:  5 times sit to  stand: 14.54 2 minute walk test: 235 ft unable to finish 2 min walk due to pain  GAIT: Distance walked: 235 ft Assistive device utilized: None Level of assistance: Modified independence Comments: antalgic gait; increasingly so as time progresses; decreased heel strike and toe off; valgus knees   TODAY'S TREATMENT:                                                                                                                              DATE:  04/01/23 TTP grossly throughout foot with greatest proximally at quadratus plantae, abductor hallicus R>L  Manual: STM to bilateral feet plantar region pre and post dry needling for trigger point identification and muscular relaxation.   Trigger Point Dry-Needling  Treatment instructions: Expect mild to moderate muscle soreness. S/S of pneumothorax if dry needled over a lung field, and to seek immediate medical attention should they occur. Patient verbalized understanding of these instructions and education.  Patient Consent Given: Yes Education handout provided: Previously provided Muscles treated: R quadratus plantae, abductor hallicus  Electrical stimulation performed: No Parameters: N/A Treatment response/outcome: decrease in tissue tension and symptoms  Plantar Fascia Stretch on Step 2 x 20 second holds Foot doming 2 x 10 bilateral   Self STM with tennis ball to bilateral feet  - 5 minutes Standing gastroc stretch 3 x 20 second holds Soleus stretch on step 3 x 20 second holds   03/29/2023 physical therapy evaluation and HEP instruction   PATIENT EDUCATION:  Education details: 5/20: HEP, Dry needling;  EVAL: Patient educated on exam findings, POC, scope of PT, HEP, and what to expect next visit. Person educated: Patient Education method: Explanation, Demonstration, and Handouts Education comprehension: verbalized understanding, returned demonstration, verbal cues required, and tactile cues required   HOME EXERCISE PROGRAM: Access  Code: [email or web address] URL: [email or web address] 04/01/23 - Arch Lifting  - 2 x daily - 7 x weekly - 2 sets - 10 reps - Gastroc Stretch on Wall  - 2 x daily -  7 x weekly - 3 reps - 20 second hold - Standing Gastroc Stretch on Step with Counter Support  - 2 x daily - 7 x weekly - 3 reps - 20 second hold  Date: 03/29/2023 Prepared by: [facility] - Seated Plantar Fascia Mobilization with Small Ball  - 2 x daily - 7 x weekly - 1 sets - 10 reps - Long Sitting Plantar Fascia Stretch with Towel  - 2 x daily - 7 x weekly - 1 sets - 5 reps - 30 sec hold - Plantar Fascia Stretch on Step  - 2 x daily - 7 x weekly - 1 sets - 5 reps - 20 sec hold  ASSESSMENT:  CLINICAL IMPRESSION: Patient with foot tenderness mostly in R foot today. Performed STM and dry needling to R plantar musculare with decrease in tension and  symptoms following. Reviewed HEP mechanics and continued with foot/ ankle mobility. Will assess response to DN next session. Patient will continue to benefit from physical therapy in order to improve function and reduce impairment.    OBJECTIVE IMPAIRMENTS: Abnormal gait, decreased activity tolerance, decreased endurance, decreased mobility, difficulty walking, decreased ROM, decreased strength, hypomobility, increased edema, increased fascial restrictions, impaired perceived functional ability, impaired flexibility, and pain.   ACTIVITY LIMITATIONS: standing, squatting, sleeping, stairs, and locomotion level  PARTICIPATION LIMITATIONS: meal prep, cleaning, shopping, community activity, and occupation  REHAB POTENTIAL: Good  CLINICAL DECISION MAKING: Stable/uncomplicated  EVALUATION COMPLEXITY: Low   GOALS: Goals reviewed with patient? No  SHORT TERM GOALS: Target date: 04/12/2023 patient will be independent with initial HEP  Baseline: Goal status: INITIAL  2.  Patient will self report 30% improvement to improve tolerance for functional activity  Baseline:  Goal  status: INITIAL  LONG TERM GOALS: Target date: 04/26/2023  Patient will be independent in self management strategies to improve quality of life and functional outcomes.  Baseline:  Goal status: INITIAL  2.  Patient will self report 50% improvement to improve tolerance for functional activity  Baseline:  Goal status: INITIAL  3.  Patient will improve FOTO score by 10 points to demonstrate improved perceived functional mobility  Baseline: 54 Goal status: INITIAL  4.  Patient will increase distance on to 300 ft to demonstrate improved functional mobility walking household and community distances.   Baseline: 235 ft Goal status: INITIAL  5.  Patient will be able to stand and walk for work x 1 hours without pain > 2/10 both feet Baseline:  Goal status: INITIAL  6.  Patient will increase bilateral leg MMTs to 4+ to 5/5 without pain to promote return to ambulation community distances with minimal deviation.  Baseline:  Goal status: INITIAL   PLAN:  PT FREQUENCY: 2x/week  PT DURATION: 4 weeks  PLANNED INTERVENTIONS: Therapeutic exercises, Therapeutic activity, Neuromuscular re-education, Balance training, Gait training, Patient/Family education, Joint manipulation, Joint mobilization, Stair training, Orthotic/Fit training, DME instructions, Aquatic Therapy, Dry Needling, Electrical stimulation, Spinal manipulation, Spinal mobilization, Cryotherapy, Moist heat, Compression bandaging, scar mobilization, Splintting, Taping, Traction, Ultrasound, Ionotophoresis 4mg /ml Dexamethasone, and Manual therapy   PLAN FOR NEXT SESSION: patient wants to try dry needling; issued information at evaluation; review HEP and goals; check hip abductor strength; retest   11:43 AM, 04/01/23 Wyman Songster PT, DPT Physical Therapist at [facility]

## 2023-04-02 DIAGNOSIS — M549 Dorsalgia, unspecified: Secondary | ICD-10-CM | POA: Diagnosis not present

## 2023-04-02 DIAGNOSIS — R419 Unspecified symptoms and signs involving cognitive functions and awareness: Secondary | ICD-10-CM | POA: Diagnosis not present

## 2023-04-02 DIAGNOSIS — R439 Unspecified disturbances of smell and taste: Secondary | ICD-10-CM | POA: Diagnosis not present

## 2023-04-02 DIAGNOSIS — M62838 Other muscle spasm: Secondary | ICD-10-CM | POA: Diagnosis not present

## 2023-04-02 MED ORDER — ONABOTULINUMTOXINA 100 UNITS IJ SOLR
100.0000 [IU] | Freq: Once | INTRAMUSCULAR | Status: AC
Start: 2023-04-02 — End: 2023-04-02
  Administered 2023-04-02: 100 [IU] via INTRAMUSCULAR

## 2023-04-02 NOTE — Addendum Note (Signed)
Addended by: Despina Arias A on: 04/02/2023 12:46 PM   Modules accepted: Orders

## 2023-04-03 DIAGNOSIS — R4701 Aphasia: Secondary | ICD-10-CM | POA: Diagnosis not present

## 2023-04-10 ENCOUNTER — Encounter (HOSPITAL_COMMUNITY): Payer: Self-pay | Admitting: Physical Therapy

## 2023-04-10 ENCOUNTER — Ambulatory Visit (HOSPITAL_COMMUNITY): Payer: Medicare Other | Admitting: Physical Therapy

## 2023-04-10 DIAGNOSIS — R262 Difficulty in walking, not elsewhere classified: Secondary | ICD-10-CM

## 2023-04-10 DIAGNOSIS — M722 Plantar fascial fibromatosis: Secondary | ICD-10-CM

## 2023-04-10 NOTE — Therapy (Signed)
OUTPATIENT PHYSICAL THERAPY TREATMENT   Patient Name: Heather [name] MRN: [id number] DOB:Oct 25, 1946, 77 y.o., female Today's Date: 04/10/2023  END OF SESSION:  PT End of Session - 04/10/23 0905     Visit Number 3    Number of Visits 8    Date for PT Re-Evaluation 04/26/23    Authorization Type Medicare part A and    PT Start Time 0905    PT Stop Time 0943    PT Time Calculation (min) 38 min    Activity Tolerance Patient tolerated treatment well;Patient limited by pain    Behavior During Therapy [facility] for tasks assessed/performed             Past Medical History:  Diagnosis Date   Adrenal adenoma    Allergy    Anemia    Anxiety    CAD (coronary artery disease)    a. 50-60% mid LAD at cardiac catheterization 2014 [facility]) b. non-obstructive CAD by cath in 09/2018   Cataract    Depression    DJD (degenerative joint disease) of cervical spine 09/12/2016   Dysrhythmia    H. pylori infection 07/19/2017   History of cardiomyopathy    History of COVID-19 12/10/2019   History of tobacco use    Hyperlipidemia    Hypothyroidism 08/24/2022   Mid back pain 02/08/2017   Neuromuscular disorder (HCC)    PAF (paroxysmal atrial fibrillation) (HCC)    Diagnosis July 2017 - spontaneously resolved   Smell disturbance 06/13/2021   Statin intolerance    Past Surgical History:  Procedure Laterality Date   ABDOMINAL HYSTERECTOMY  1980   endometriosis   BIOPSY  07/18/2021   Procedure: BIOPSY;  Surgeon: Dolores Frame, MD;  [facility]: [facility];  Service: Gastroenterology;;   CATARACT EXTRACTION Left 11/2018   COLONOSCOPY WITH PROPOFOL N/A 06/27/2022   Procedure: COLONOSCOPY WITH PROPOFOL;  Surgeon: Dolores Frame, MD;  [facility]: [facility];  Service: Gastroenterology;  Laterality: N/A;  11:15   CORONARY PRESSURE/FFR STUDY N/A 06/01/2021   Procedure: INTRAVASCULAR PRESSURE WIRE/FFR STUDY;  Surgeon: Lyn Records, MD;  [facility]: [facility];   Service: Cardiovascular;  Laterality: N/A;   ESOPHAGOGASTRODUODENOSCOPY (EGD) WITH PROPOFOL N/A 07/18/2021   Procedure: ESOPHAGOGASTRODUODENOSCOPY (EGD) WITH PROPOFOL;  Surgeon: Dolores Frame, MD;  [facility];  Service: Gastroenterology;  Laterality: N/A;  12:00   EYE SURGERY  cataracts  2021/2022   HEMOSTASIS CLIP PLACEMENT  06/27/2022   Procedure: HEMOSTASIS CLIP PLACEMENT;  Surgeon: Dolores Frame, MD;  [facility]: [facility];  Service: Gastroenterology;;   LEFT HEART CATH AND CORONARY ANGIOGRAPHY N/A 09/25/2018   Procedure: LEFT HEART CATH AND CORONARY ANGIOGRAPHY;  Surgeon: Lyn Records, MD;  [facility]: [facility];  Service: Cardiovascular;  Laterality: N/A;   LEFT HEART CATH AND CORONARY ANGIOGRAPHY N/A 06/01/2021   Procedure: LEFT HEART CATH AND CORONARY ANGIOGRAPHY;  Surgeon: Lyn Records, MD;  [facility];  Service: Cardiovascular;  Laterality: N/A;   POLYPECTOMY  06/27/2022   Procedure: POLYPECTOMY;  Surgeon: Dolores Frame, MD;  [facility];  Service: Gastroenterology;;   Gaspar Bidding DILATION  07/18/2021   Procedure: Gaspar Bidding DILATION;  Surgeon: Marguerita Merles, Reuel Boom, MD;  [facility];  Service: Gastroenterology;;   SUBMUCOSAL LIFTING INJECTION  06/27/2022   Procedure: SUBMUCOSAL LIFTING INJECTION;  Surgeon: Dolores Frame, MD;  [facility];  Service: Gastroenterology;;   SUBMUCOSAL TATTOO INJECTION  06/27/2022   Procedure:  SUBMUCOSAL TATTOO INJECTION;  Surgeon: Marguerita Merles, Reuel Boom, MD;  [facility];  Service: Gastroenterology;;   TONSILLECTOMY  1954   Patient Active Problem List   Diagnosis Date Noted   Cerebral atrophy (HCC) 02/13/2023   Foot and ankle pain 09/26/2022   Hypothyroidism 08/24/2022   Cyst of buttocks 05/04/2022   DOE (dyspnea on exertion) 10/09/2021   Post-COVID syndrome 07/31/2021   Dysphagia 07/10/2021   Tubular adenoma of  colon 07/19/2017   Hx of colonic polyps 12/03/2016   Family history of colon cancer 12/03/2016   Encounter for therapeutic drug monitoring 11/21/2016   CAD (coronary artery disease) 09/12/2016   PAF (paroxysmal atrial fibrillation) (HCC) 09/12/2016   DJD (degenerative joint disease) of cervical spine 09/12/2016   Statin intolerance 09/12/2016   TIA (transient ischemic attack) 09/12/2016   Diverticulitis of colon 09/12/2016   Myofascial pain syndrome 09/06/2016   Takotsubo syndrome 09/06/2016   HLD (hyperlipidemia) 09/06/2016   GAD (generalized anxiety disorder) 09/06/2016   Recurrent UTI (urinary tract infection) 09/06/2016    PCP: Trena Platt, MD  REFERRING PROVIDER: Nicholes Rough, DPM  REFERRING DIAG: M72.2 (ICD-10-CM) - Plantar fasciitis of right foot M72.2 (ICD-10-CM) - Plantar fasciitis, left  THERAPY DIAG:  Difficulty in walking, not elsewhere classified  Plantar fasciitis, bilateral  Rationale for Evaluation and Treatment: Rehabilitation  ONSET DATE: since August 2023  SUBJECTIVE:   SUBJECTIVE STATEMENT: Patient states she feels DN was helpful. Today is the first day she has a little pain. Got new insoles. Has been doing HEP and ice and massage with ball.    EVAL: Pain started in August of 2023; she and her husband had started a walking program and was wearing old footwear; then in Sept did a lot of walking at a conference and that caused it to worsen.   Started a job in November at [employer]; she started having pain from prolonged standing/walking.  Has been using ice and wearing insoles and bought new shoes (Hokas and Brooks) and stretching but pain increases with working. Decreases when she has time off.  Started seeing a podiatrist who has given her 2 injections (got them in Feb of this year) helped temporarily; maybe a couple weeks.  Wearing a brace sometimes with foot support. No pain at rest; increased pain with standing, walking; patient asks about ultrasound as  she heard from friends this was beneficial.  PERTINENT HISTORY:  PAIN:  Are you having pain? Yes: NPRS scale: 3/10 Pain location: bottom of the feet, heels Pain description: burning, tender Aggravating factors: standing, walking Relieving factors: rest  PRECAUTIONS: None  WEIGHT BEARING RESTRICTIONS: No  FALLS:  Has patient fallen in last 6 months? No  OCCUPATION: librarian  PLOF: Independent  PATIENT GOALS: decreased pain  NEXT MD VISIT: unknown  OBJECTIVE:   DIAGNOSTIC FINDINGS:  Narrative & Impression  CLINICAL DATA:  Right ankle pain and swelling, no known injury   EXAM: RIGHT ANKLE - COMPLETE 3+ VIEW   COMPARISON:  None available   FINDINGS: Osseous mineralization normal.   Joint spaces preserved.   No acute fracture, dislocation, or bone destruction.   IMPRESSION: No acute abnormalities.      PATIENT SURVEYS:  FOTO 54  COGNITION: Overall cognitive status: Within functional limits for tasks assessed     SENSATION: Burning; sometimes feels numb ball of foot  EDEMA:  No swelling in feet    POSTURE: No Significant postural limitations  PALPATION: Tender bilateral heels; laterally > medially and right > left  at origin of plantar fascia; noted swelling right plantar aspect of foot  LOWER EXTREMITY ROM:  Active ROM Right eval Left eval  Hip flexion    Hip extension    Hip abduction    Hip adduction    Hip internal rotation    Hip external rotation    Knee flexion    Knee extension    Ankle dorsiflexion 14 14  Ankle plantarflexion 40* 60  Ankle inversion 25 20  Ankle eversion 4 6   (Blank rows = not tested)  LOWER EXTREMITY MMT:  MMT Right eval Left eval Right 04/10/23 Left 04/10/23  Hip flexion      Hip extension      Hip abduction      Hip adduction      Hip internal rotation      Hip external rotation      Knee flexion (sitting) 4+ 4+    Knee extension 4+ 5    Ankle dorsiflexion 4+ 4+ 5 4+  Ankle plantarflexion  3-(painful) 3-    Ankle inversion 4+ 4+ 5 4+  Ankle eversion 4+ 4+ 5 4+   (Blank rows = not tested)   FUNCTIONAL TESTS:  5 times sit to stand: 14.54 2 minute walk test: 235 ft unable to finish 2 min walk due to pain  GAIT: Distance walked: 235 ft Assistive device utilized: None Level of assistance: Modified independence Comments: antalgic gait; increasingly so as time progresses; decreased heel strike and toe off; valgus knees   TODAY'S TREATMENT:                                                                                                                              DATE:  04/10/23 : 455 feet, WFL mechanics, no increase in symptoms TTP grossly throughout foot with greatest proximally at quadratus plantae, abductor hallicus R>L  Manual: STM to bilateral feet plantar region pre and post dry needling for trigger point identification and muscular relaxation.   Trigger Point Dry-Needling  Treatment instructions: Expect mild to moderate muscle soreness. S/S of pneumothorax if dry needled over a lung field, and to seek immediate medical attention should they occur. Patient verbalized understanding of these instructions and education.  Patient Consent Given: Yes Education handout provided: Previously provided Muscles treated: R quadratus plantae, abductor hallicus  Electrical stimulation performed: No Parameters: N/A Treatment response/outcome: decrease in tissue tension and symptoms  Gait 226 feet - no pain Ankle inversion GTB 2 x 10 R ankle   04/01/23 TTP grossly throughout foot with greatest proximally at quadratus plantae, abductor hallicus R>L  Manual: STM to bilateral feet plantar region pre and post dry needling for trigger point identification and muscular relaxation.   Trigger Point Dry-Needling  Treatment instructions: Expect mild to moderate muscle soreness. S/S of pneumothorax if dry needled over a lung field, and to seek immediate medical attention should they  occur. Patient verbalized understanding of these instructions and education.  Patient  Consent Given: Yes Education handout provided: Previously provided Muscles treated: R quadratus plantae, abductor hallicus  Electrical stimulation performed: No Parameters: N/A Treatment response/outcome: decrease in tissue tension and symptoms  Plantar Fascia Stretch on Step 2 x 20 second holds Foot doming 2 x 10 bilateral   Self STM with tennis ball to bilateral feet  - 5 minutes Standing gastroc stretch 3 x 20 second holds Soleus stretch on step 3 x 20 second holds   03/29/2023 physical therapy evaluation and HEP instruction   PATIENT EDUCATION:  Education details: 5/20: HEP, Dry needling;  EVAL: Patient educated on exam findings, POC, scope of PT, HEP, and what to expect next visit. Person educated: Patient Education method: Explanation, Demonstration, and Handouts Education comprehension: verbalized understanding, returned demonstration, verbal cues required, and tactile cues required   HOME EXERCISE PROGRAM: Access Code: [email or web address] URL: [email or web address] 04/10/23 - Ankle Inversion with Anchored Resistance at Table  - 2-3 x daily - 7 x weekly - 3 sets - 10 reps  04/01/23 - Arch Lifting  - 2 x daily - 7 x weekly - 2 sets - 10 reps - Gastroc Stretch on Wall  - 2 x daily - 7 x weekly - 3 reps - 20 second hold - Standing Gastroc Stretch on Step with Counter Support  - 2 x daily - 7 x weekly - 3 reps - 20 second hold  Date: 03/29/2023 Prepared by: [facility] - Seated Plantar Fascia Mobilization with Small Ball  - 2 x daily - 7 x weekly - 1 sets - 10 reps - Long Sitting Plantar Fascia Stretch with Towel  - 2 x daily - 7 x weekly - 1 sets - 5 reps - 30 sec hold - Plantar Fascia Stretch on Step  - 2 x daily - 7 x weekly - 1 sets - 5 reps - 20 sec hold  ASSESSMENT:  CLINICAL IMPRESSION: Patient with improvement in symptoms since last session with minimal foot symptoms present  today. Patient with great improvement in since evaluation with no increase in symptoms. Continued with DN with decrease in symptoms to 0/10 following from 3/10. Added ankle strengthening which is tolerated well. Patient will continue to benefit from physical therapy in order to improve function and reduce impairment.    OBJECTIVE IMPAIRMENTS: Abnormal gait, decreased activity tolerance, decreased endurance, decreased mobility, difficulty walking, decreased ROM, decreased strength, hypomobility, increased edema, increased fascial restrictions, impaired perceived functional ability, impaired flexibility, and pain.   ACTIVITY LIMITATIONS: standing, squatting, sleeping, stairs, and locomotion level  PARTICIPATION LIMITATIONS: meal prep, cleaning, shopping, community activity, and occupation  REHAB POTENTIAL: Good  CLINICAL DECISION MAKING: Stable/uncomplicated  EVALUATION COMPLEXITY: Low   GOALS: Goals reviewed with patient? No  SHORT TERM GOALS: Target date: 04/12/2023 patient will be independent with initial HEP  Baseline: Goal status: INITIAL  2.  Patient will self report 30% improvement to improve tolerance for functional activity  Baseline:  Goal status: INITIAL  LONG TERM GOALS: Target date: 04/26/2023  Patient will be independent in self management strategies to improve quality of life and functional outcomes.  Baseline:  Goal status: INITIAL  2.  Patient will self report 50% improvement to improve tolerance for functional activity  Baseline:  Goal status: INITIAL  3.  Patient will improve FOTO score by 10 points to demonstrate improved perceived functional mobility  Baseline: 54 Goal status: INITIAL  4.  Patient will increase distance on to 300 ft to demonstrate improved functional mobility walking household and  community distances.   Baseline: 235 ft Goal status: INITIAL  5.  Patient will be able to stand and walk for work x 1 hours without pain >  2/10 both feet Baseline:  Goal status: INITIAL  6.  Patient will increase bilateral leg MMTs to 4+ to 5/5 without pain to promote return to ambulation community distances with minimal deviation.  Baseline:  Goal status: INITIAL   PLAN:  PT FREQUENCY: 2x/week  PT DURATION: 4 weeks  PLANNED INTERVENTIONS: Therapeutic exercises, Therapeutic activity, Neuromuscular re-education, Balance training, Gait training, Patient/Family education, Joint manipulation, Joint mobilization, Stair training, Orthotic/Fit training, DME instructions, Aquatic Therapy, Dry Needling, Electrical stimulation, Spinal manipulation, Spinal mobilization, Cryotherapy, Moist heat, Compression bandaging, scar mobilization, Splintting, Taping, Traction, Ultrasound, Ionotophoresis 4mg /ml Dexamethasone, and Manual therapy   PLAN FOR NEXT SESSION: patient wants to try dry needling; issued information at evaluation; review HEP and goals; check hip abductor strength   9:05 AM, 04/10/23 Wyman Songster PT, DPT Physical Therapist at [facility]

## 2023-04-11 ENCOUNTER — Ambulatory Visit (HOSPITAL_COMMUNITY): Payer: Medicare Other | Admitting: Physical Therapy

## 2023-04-12 ENCOUNTER — Encounter (HOSPITAL_COMMUNITY): Payer: Medicare Other | Admitting: Physical Therapy

## 2023-04-17 ENCOUNTER — Other Ambulatory Visit (HOSPITAL_COMMUNITY): Payer: Self-pay | Admitting: Internal Medicine

## 2023-04-17 DIAGNOSIS — Z1231 Encounter for screening mammogram for malignant neoplasm of breast: Secondary | ICD-10-CM

## 2023-04-18 ENCOUNTER — Encounter (HOSPITAL_COMMUNITY): Payer: Medicare Other | Admitting: Physical Therapy

## 2023-04-18 DIAGNOSIS — R4701 Aphasia: Secondary | ICD-10-CM | POA: Diagnosis not present

## 2023-04-22 ENCOUNTER — Ambulatory Visit (HOSPITAL_COMMUNITY): Payer: Medicare Other | Attending: Podiatry | Admitting: Physical Therapy

## 2023-04-22 ENCOUNTER — Encounter (HOSPITAL_COMMUNITY): Payer: Self-pay | Admitting: Physical Therapy

## 2023-04-22 DIAGNOSIS — R262 Difficulty in walking, not elsewhere classified: Secondary | ICD-10-CM | POA: Insufficient documentation

## 2023-04-22 DIAGNOSIS — M722 Plantar fascial fibromatosis: Secondary | ICD-10-CM | POA: Insufficient documentation

## 2023-04-22 NOTE — Therapy (Signed)
OUTPATIENT PHYSICAL THERAPY TREATMENT   Patient Name: Heather [name] MRN: [id number] DOB:02/04/1946, 77 y.o., female Today's Date: 04/22/2023  PHYSICAL THERAPY DISCHARGE SUMMARY  Visits from Start of Care: 4  Current functional level related to goals / functional outcomes: See below   Remaining deficits: See below   Education / Equipment: See below   Patient agrees to discharge. Patient goals were met. Patient is being discharged due to meeting the stated rehab goals.   END OF SESSION:  PT End of Session - 04/22/23 1304     Visit Number 4    Number of Visits 8    Date for PT Re-Evaluation 04/26/23    Authorization Type Medicare part A and    PT Start Time 1304    PT Stop Time 1345    PT Time Calculation (min) 41 min    Activity Tolerance Patient tolerated treatment well;Patient limited by pain    Behavior During Therapy WFL for tasks assessed/performed             Past Medical History:  Diagnosis Date   Adrenal adenoma    Allergy    Anemia    Anxiety    CAD (coronary artery disease)    a. 50-60% mid LAD at cardiac catheterization 2014 [facility]) b. non-obstructive CAD by cath in 09/2018   Cataract    Depression    DJD (degenerative joint disease) of cervical spine 09/12/2016   Dysrhythmia    H. pylori infection 07/19/2017   History of cardiomyopathy    History of COVID-19 12/10/2019   History of tobacco use    Hyperlipidemia    Hypothyroidism 08/24/2022   Mid back pain 02/08/2017   Neuromuscular disorder (HCC)    PAF (paroxysmal atrial fibrillation) (HCC)    Diagnosis July 2017 - spontaneously resolved   Smell disturbance 06/13/2021   Statin intolerance    Past Surgical History:  Procedure Laterality Date   ABDOMINAL HYSTERECTOMY  1980   endometriosis   BIOPSY  07/18/2021   Procedure: BIOPSY;  Surgeon: Dolores Frame, MD;  [facility];  Service: Gastroenterology;;   CATARACT EXTRACTION Left 11/2018   COLONOSCOPY WITH  PROPOFOL N/A 06/27/2022   Procedure: COLONOSCOPY WITH PROPOFOL;  Surgeon: Dolores Frame, MD;  [facility];  Service: Gastroenterology;  Laterality: N/A;  11:15   CORONARY PRESSURE/FFR STUDY N/A 06/01/2021   Procedure: INTRAVASCULAR PRESSURE WIRE/FFR STUDY;  Surgeon: Lyn Records, MD;  [facility];  Service: Cardiovascular;  Laterality: N/A;   ESOPHAGOGASTRODUODENOSCOPY (EGD) WITH PROPOFOL N/A 07/18/2021   Procedure: ESOPHAGOGASTRODUODENOSCOPY (EGD) WITH PROPOFOL;  Surgeon: Dolores Frame, MD;  [facility];  Service: Gastroenterology;  Laterality: N/A;  12:00   EYE SURGERY  cataracts  2021/2022   HEMOSTASIS CLIP PLACEMENT  06/27/2022   Procedure: HEMOSTASIS CLIP PLACEMENT;  Surgeon: Dolores Frame, MD;  [facility];  Service: Gastroenterology;;   LEFT HEART CATH AND CORONARY ANGIOGRAPHY N/A 09/25/2018   Procedure: LEFT HEART CATH AND CORONARY ANGIOGRAPHY;  Surgeon: Lyn Records, MD;  [facility];  Service: Cardiovascular;  Laterality: N/A;   LEFT HEART CATH AND CORONARY ANGIOGRAPHY N/A 06/01/2021   Procedure: LEFT HEART CATH AND CORONARY ANGIOGRAPHY;  Surgeon: Lyn Records, MD;  [facility];  Service: Cardiovascular;  Laterality: N/A;   POLYPECTOMY  06/27/2022   Procedure: POLYPECTOMY;  Surgeon: Dolores Frame, MD;  [facility];  Service: Gastroenterology;;   Gaspar Bidding  DILATION  07/18/2021   Procedure: SAVORY DILATION;  Surgeon: Marguerita Merles, Reuel Boom, MD;  [facility];  Service: Gastroenterology;;   SUBMUCOSAL LIFTING INJECTION  06/27/2022   Procedure: SUBMUCOSAL LIFTING INJECTION;  Surgeon: Marguerita Merles, Reuel Boom, MD;  [facility];  Service: Gastroenterology;;   SUBMUCOSAL TATTOO INJECTION  06/27/2022   Procedure: SUBMUCOSAL TATTOO INJECTION;  Surgeon: Dolores Frame, MD;  [facility];  Service:  Gastroenterology;;   TONSILLECTOMY  1954   Patient Active Problem List   Diagnosis Date Noted   Cerebral atrophy (HCC) 02/13/2023   Foot and ankle pain 09/26/2022   Hypothyroidism 08/24/2022   Cyst of buttocks 05/04/2022   DOE (dyspnea on exertion) 10/09/2021   Post-COVID syndrome 07/31/2021   Dysphagia 07/10/2021   Tubular adenoma of colon 07/19/2017   Hx of colonic polyps 12/03/2016   Family history of colon cancer 12/03/2016   Encounter for therapeutic drug monitoring 11/21/2016   CAD (coronary artery disease) 09/12/2016   PAF (paroxysmal atrial fibrillation) (HCC) 09/12/2016   DJD (degenerative joint disease) of cervical spine 09/12/2016   Statin intolerance 09/12/2016   TIA (transient ischemic attack) 09/12/2016   Diverticulitis of colon 09/12/2016   Myofascial pain syndrome 09/06/2016   Takotsubo syndrome 09/06/2016   HLD (hyperlipidemia) 09/06/2016   GAD (generalized anxiety disorder) 09/06/2016   Recurrent UTI (urinary tract infection) 09/06/2016    PCP: Trena Platt, MD  REFERRING PROVIDER: Nicholes Rough, DPM  REFERRING DIAG: M72.2 (ICD-10-CM) - Plantar fasciitis of right foot M72.2 (ICD-10-CM) - Plantar fasciitis, left  THERAPY DIAG:  Difficulty in walking, not elsewhere classified  Plantar fasciitis, bilateral  Rationale for Evaluation and Treatment: Rehabilitation  ONSET DATE: since August 2023  SUBJECTIVE:   SUBJECTIVE STATEMENT: Patient states feet have been doing pretty good. Burning in R foot after walking for about 30  minutes and then getting into car. Been doing HEP. Pain mostly with sitting after standing. Doing 6-7 hour shifts on her feet.    EVAL: Pain started in August of 2023; she and her husband had started a walking program and was wearing old footwear; then in Sept did a lot of walking at a conference and that caused it to worsen.   Started a job in November at [employer]; she started having pain from prolonged standing/walking.  Has been  using ice and wearing insoles and bought new shoes (Hokas and Brooks) and stretching but pain increases with working. Decreases when she has time off.  Started seeing a podiatrist who has given her 2 injections (got them in Feb of this year) helped temporarily; maybe a couple weeks.  Wearing a brace sometimes with foot support. No pain at rest; increased pain with standing, walking; patient asks about ultrasound as she heard from friends this was beneficial.  PERTINENT HISTORY:  PAIN:  Are you having pain? Yes: NPRS scale: 1/10 Pain location: bottom of the feet, heels Pain description: burning, tender Aggravating factors: standing, walking Relieving factors: rest  PRECAUTIONS: None  WEIGHT BEARING RESTRICTIONS: No  FALLS:  Has patient fallen in last 6 months? No  OCCUPATION: librarian  PLOF: Independent  PATIENT GOALS: decreased pain  NEXT MD VISIT: unknown  OBJECTIVE:   DIAGNOSTIC FINDINGS:  Narrative & Impression  CLINICAL DATA:  Right ankle pain and swelling, no known injury   EXAM: RIGHT ANKLE - COMPLETE 3+ VIEW   COMPARISON:  None available   FINDINGS: Osseous mineralization normal.   Joint spaces preserved.   No acute fracture, dislocation,  or bone destruction.   IMPRESSION: No acute abnormalities.      PATIENT SURVEYS:  FOTO 54  04/22/23: 80% function  COGNITION: Overall cognitive status: Within functional limits for tasks assessed     SENSATION: Burning; sometimes feels numb ball of foot  EDEMA:  No swelling in feet    POSTURE: No Significant postural limitations  PALPATION: Tender bilateral heels; laterally > medially and right > left at origin of plantar fascia; noted swelling right plantar aspect of foot  LOWER EXTREMITY ROM:  Active ROM Right eval Left eval Right 04/22/23 Left 04/22/23  Hip flexion      Hip extension      Hip abduction      Hip adduction      Hip internal rotation      Hip external rotation      Knee flexion       Knee extension      Ankle dorsiflexion 14 14 17 16   Ankle plantarflexion 40* 60 50 55  Ankle inversion 25 20 29 28   Ankle eversion 4 6 7 5    (Blank rows = not tested)  LOWER EXTREMITY MMT:  MMT Right eval Left eval Right 04/10/23 Left 04/10/23 Right 04/22/23 Left 04/22/23  Hip flexion        Hip extension        Hip abduction        Hip adduction        Hip internal rotation        Hip external rotation        Knee flexion (sitting) 4+ 4+      Knee extension 4+ 5      Ankle dorsiflexion 4+ 4+ 5 4+ 5 5  Ankle plantarflexion 3-(painful) 3-      Ankle inversion 4+ 4+ 5 4+ 5 5  Ankle eversion 4+ 4+ 5 4+ 5 5   (Blank rows = not tested)   FUNCTIONAL TESTS:  5 times sit to stand: 14.54 2 minute walk test: 235 ft unable to finish 2 min walk due to pain  GAIT: Distance walked: 235 ft Assistive device utilized: None Level of assistance: Modified independence Comments: antalgic gait; increasingly so as time progresses; decreased heel strike and toe off; valgus knees   Reassessment 04/22/23 : 415 feet  TODAY'S TREATMENT:                                                                                                                              DATE:  04/22/23 Reassessment Education  04/10/23 : 455 feet, WFL mechanics, no increase in symptoms TTP grossly throughout foot with greatest proximally at quadratus plantae, abductor hallicus R>L  Manual: STM to bilateral feet plantar region pre and post dry needling for trigger point identification and muscular relaxation.   Trigger Point Dry-Needling  Treatment instructions: Expect mild to moderate muscle soreness. S/S of pneumothorax if dry needled over a lung field, and to seek immediate medical  attention should they occur. Patient verbalized understanding of these instructions and education.  Patient Consent Given: Yes Education handout provided: Previously provided Muscles treated: R quadratus plantae, abductor  hallicus  Electrical stimulation performed: No Parameters: N/A Treatment response/outcome: decrease in tissue tension and symptoms  Gait 226 feet - no pain Ankle inversion GTB 2 x 10 R ankle   04/01/23 TTP grossly throughout foot with greatest proximally at quadratus plantae, abductor hallicus R>L  Manual: STM to bilateral feet plantar region pre and post dry needling for trigger point identification and muscular relaxation.   Trigger Point Dry-Needling  Treatment instructions: Expect mild to moderate muscle soreness. S/S of pneumothorax if dry needled over a lung field, and to seek immediate medical attention should they occur. Patient verbalized understanding of these instructions and education.  Patient Consent Given: Yes Education handout provided: Previously provided Muscles treated: R quadratus plantae, abductor hallicus  Electrical stimulation performed: No Parameters: N/A Treatment response/outcome: decrease in tissue tension and symptoms  Plantar Fascia Stretch on Step 2 x 20 second holds Foot doming 2 x 10 bilateral   Self STM with tennis ball to bilateral feet  - 5 minutes Standing gastroc stretch 3 x 20 second holds Soleus stretch on step 3 x 20 second holds   03/29/2023 physical therapy evaluation and HEP instruction   PATIENT EDUCATION:  Education details: 04/22/23: HEP, reassessment findings, returning to PT if needed, gradual increase in activity; 5/20: HEP, Dry needling;  EVAL: Patient educated on exam findings, POC, scope of PT, HEP, and what to expect next visit. Person educated: Patient Education method: Explanation, Demonstration, and Handouts Education comprehension: verbalized understanding, returned demonstration, verbal cues required, and tactile cues required   HOME EXERCISE PROGRAM: Access Code: [email or web address] URL: [email or web address] 04/10/23 - Ankle Inversion with Anchored Resistance at Table  - 2-3 x daily - 7 x weekly - 3 sets - 10  reps  04/01/23 - Arch Lifting  - 2 x daily - 7 x weekly - 2 sets - 10 reps - Gastroc Stretch on Wall  - 2 x daily - 7 x weekly - 3 reps - 20 second hold - Standing Gastroc Stretch on Step with Counter Support  - 2 x daily - 7 x weekly - 3 reps - 20 second hold  Date: 03/29/2023 Prepared by: [facility] - Rehab - Seated Plantar Fascia Mobilization with Small Ball  - 2 x daily - 7 x weekly - 1 sets - 10 reps - Long Sitting Plantar Fascia Stretch with Towel  - 2 x daily - 7 x weekly - 1 sets - 5 reps - 30 sec hold - Plantar Fascia Stretch on Step  - 2 x daily - 7 x weekly - 1 sets - 5 reps - 20 sec hold  ASSESSMENT:  CLINICAL IMPRESSION: Patient has met all short term goals and long term goals with ability to complete HEP and improvement in symptoms, strength, ROM, activity tolerance, gait, balance, and functional mobility. Patient with continued intermittent symptoms after periods of weightbearing. Overall, has progressed well and has been completing HEP. Reviewed HEP and educated on returning to PT if needed. Patient discharged from PT at this time.   OBJECTIVE IMPAIRMENTS: Abnormal gait, decreased activity tolerance, decreased endurance, decreased mobility, difficulty walking, decreased ROM, decreased strength, hypomobility, increased edema, increased fascial restrictions, impaired perceived functional ability, impaired flexibility, and pain.   ACTIVITY LIMITATIONS: standing, squatting, sleeping, stairs, and locomotion level  PARTICIPATION LIMITATIONS: meal prep, cleaning, shopping, community activity, and occupation  REHAB  POTENTIAL: Good  CLINICAL DECISION MAKING: Stable/uncomplicated  EVALUATION COMPLEXITY: Low   GOALS: Goals reviewed with patient? No  SHORT TERM GOALS: Target date: 04/12/2023 patient will be independent with initial HEP  Baseline: Goal status: MET  2.  Patient will self report 30% improvement to improve tolerance for functional activity  Baseline:  Goal status:  MET  LONG TERM GOALS: Target date: 04/26/2023  Patient will be independent in self management strategies to improve quality of life and functional outcomes.  Baseline:  Goal status: MET  2.  Patient will self report 50% improvement to improve tolerance for functional activity  Baseline:  Goal status: MET  3.  Patient will improve FOTO score by 10 points to demonstrate improved perceived functional mobility  Baseline: 54 Goal status: MET  4.  Patient will increase distance on to 300 ft to demonstrate improved functional mobility walking household and community distances.   Baseline: 235 ft Goal status: MET  5.  Patient will be able to stand and walk for work x 1 hours without pain > 2/10 both feet Baseline:  Goal status: MET  6.  Patient will increase bilateral leg MMTs to 4+ to 5/5 without pain to promote return to ambulation community distances with minimal deviation.  Baseline:  Goal status: MET   PLAN:  PT FREQUENCY: 2x/week  PT DURATION: 4 weeks  PLANNED INTERVENTIONS: Therapeutic exercises, Therapeutic activity, Neuromuscular re-education, Balance training, Gait training, Patient/Family education, Joint manipulation, Joint mobilization, Stair training, Orthotic/Fit training, DME instructions, Aquatic Therapy, Dry Needling, Electrical stimulation, Spinal manipulation, Spinal mobilization, Cryotherapy, Moist heat, Compression bandaging, scar mobilization, Splintting, Taping, Traction, Ultrasound, Ionotophoresis 4mg /ml Dexamethasone, and Manual therapy   PLAN FOR NEXT SESSION: n/a   1:04 PM, 04/22/23 Wyman Songster PT, DPT Physical Therapist at [facility]

## 2023-04-26 ENCOUNTER — Ambulatory Visit
Admission: EM | Admit: 2023-04-26 | Discharge: 2023-04-26 | Disposition: A | Discharge status: Home / Self Care | Payer: Medicare Other | Attending: Urgent Care | Admitting: Urgent Care

## 2023-04-26 DIAGNOSIS — B029 Zoster without complications: Secondary | ICD-10-CM

## 2023-04-26 MED ORDER — VALACYCLOVIR HCL 1 G PO TABS: 1000.0000 mg | ORAL_TABLET | Freq: Three times a day (TID) | ORAL | 0 refills | Status: DC

## 2023-04-26 MED ORDER — GABAPENTIN 100 MG PO CAPS: 100.0000 mg | ORAL_CAPSULE | Freq: Three times a day (TID) | ORAL | 0 refills | Status: DC

## 2023-04-26 NOTE — ED Provider Notes (Signed)
[facility] - URGENT CARE CENTER  Note:  This document was prepared using Conservation officer, historic buildings and may include unintentional dictation errors.  MRN: [id number] DOB: 1946-08-23  Subjective:   Heather [name] is a 77 y.o. female presenting for 2 day history of acute onset painful rash over the right thoracic back just under her bra.  Denies fevers, drainage of pus or bleeding.  No itching.  No exposures that she knows of.  She did get the Shingrix vaccine fall 2023.  No current facility-administered medications for this encounter.  Current Outpatient Medications:    ALPRAZolam (XANAX) 0.5 MG tablet, Take 1-2 tablets 30 min prior to MRI. Can take additional tablet at time of test if needed. Must have driver to and from test. Can cause drowsiness., Disp: 3 tablet, Rfl: 0   buPROPion (WELLBUTRIN SR) 150 MG 12 hr tablet, TAKE ONE TABLET BY MOUTH TWICE A DAY, Disp: 180 tablet, Rfl: 1   Cholecalciferol (VITAMIN D3) 125 MCG (5000 UT) TABS, Take 5,000 Units by mouth daily at 12 noon., Disp: , Rfl:    Coenzyme Q10 100 MG capsule, Take 100 mg by mouth daily at 12 noon., Disp: , Rfl:    diazepam (VALIUM) 5 MG tablet, Take 1 tablet (5 mg total) by mouth daily as needed for anxiety or muscle spasms., Disp: 30 tablet, Rfl: 0   docusate sodium (COLACE) 100 MG capsule, Take 100 mg by mouth 2 (two) times daily. , Disp: , Rfl:    estradiol (ESTRACE) 1 MG tablet, Take 1 mg by mouth daily., Disp: , Rfl:    inclisiran (LEQVIO) 284 MG/1.5ML SOSY injection, Inject 284 mg into the skin once., Disp: , Rfl:    nitroGLYCERIN (NITROSTAT) 0.4 MG SL tablet, Place 1 tablet under the tongue every 5 minutes as needed for chest pain., Disp: 25 tablet, Rfl: 1   oxyCODONE-acetaminophen (PERCOCET/ROXICET) 5-325 MG tablet, TAKE 1 TABLET DAILY AS NEEDED FOR SEVERE PAIN., Disp: 30 tablet, Rfl: 0   progesterone (PROMETRIUM) 200 MG capsule, Take 1 capsule (200 mg total) by mouth daily., Disp: 90 capsule, Rfl: 1   thyroid  (NP THYROID) 60 MG tablet, Take 1 tablet (60 mg total) by mouth daily before breakfast. (Patient not taking: Reported on 04/01/2023), Disp: 90 tablet, Rfl: 0   warfarin (COUMADIN) 5 MG tablet, Take 1 tablet to 1 1/2 tablets daily as directed by coumadin clinic, Disp: 135 tablet, Rfl: 1   Allergies  Allergen Reactions   Statins Other (See Comments)    Muscle weakness   Other     EKG pads causes blisters    Sulfa Antibiotics Rash    Past Medical History:  Diagnosis Date   Adrenal adenoma    Allergy    Anemia    Anxiety    CAD (coronary artery disease)    a. 50-60% mid LAD at cardiac catheterization 2014 [facility]) b. non-obstructive CAD by cath in 09/2018   Cataract    Depression    DJD (degenerative joint disease) of cervical spine 09/12/2016   Dysrhythmia    H. pylori infection 07/19/2017   History of cardiomyopathy    History of COVID-19 12/10/2019   History of tobacco use    Hyperlipidemia    Hypothyroidism 08/24/2022   Mid back pain 02/08/2017   Neuromuscular disorder (HCC)    PAF (paroxysmal atrial fibrillation) [facility])    Diagnosis July 2017 - spontaneously resolved   Smell disturbance 06/13/2021   Statin intolerance      Past Surgical  History:  Procedure Laterality Date   ABDOMINAL HYSTERECTOMY  1980   endometriosis   BIOPSY  07/18/2021   Procedure: BIOPSY;  Surgeon: Dolores Frame, MD;  [facility];  Service: Gastroenterology;;   CATARACT EXTRACTION Left 11/2018   COLONOSCOPY WITH PROPOFOL N/A 06/27/2022   Procedure: COLONOSCOPY WITH PROPOFOL;  Surgeon: Dolores Frame, MD;  [facility];  Service: Gastroenterology;  Laterality: N/A;  11:15   CORONARY PRESSURE/FFR STUDY N/A 06/01/2021   Procedure: INTRAVASCULAR PRESSURE WIRE/FFR STUDY;  Surgeon: Lyn Records, MD;  [facility];  Service: Cardiovascular;  Laterality: N/A;   ESOPHAGOGASTRODUODENOSCOPY (EGD) WITH PROPOFOL N/A 07/18/2021   Procedure:  ESOPHAGOGASTRODUODENOSCOPY (EGD) WITH PROPOFOL;  Surgeon: Dolores Frame, MD;  [facility];  Service: Gastroenterology;  Laterality: N/A;  12:00   EYE SURGERY  cataracts  2021/2022   HEMOSTASIS CLIP PLACEMENT  06/27/2022   Procedure: HEMOSTASIS CLIP PLACEMENT;  Surgeon: Dolores Frame, MD;  [facility];  Service: Gastroenterology;;   LEFT HEART CATH AND CORONARY ANGIOGRAPHY N/A 09/25/2018   Procedure: LEFT HEART CATH AND CORONARY ANGIOGRAPHY;  Surgeon: Lyn Records, MD;  [facility];  Service: Cardiovascular;  Laterality: N/A;   LEFT HEART CATH AND CORONARY ANGIOGRAPHY N/A 06/01/2021   Procedure: LEFT HEART CATH AND CORONARY ANGIOGRAPHY;  Surgeon: Lyn Records, MD;  [facility];  Service: Cardiovascular;  Laterality: N/A;   POLYPECTOMY  06/27/2022   Procedure: POLYPECTOMY;  Surgeon: Dolores Frame, MD;  [facility];  Service: Gastroenterology;;   Gaspar Bidding DILATION  07/18/2021   Procedure: Gaspar Bidding DILATION;  Surgeon: Marguerita Merles, Reuel Boom, MD;  [facility];  Service: Gastroenterology;;   SUBMUCOSAL LIFTING INJECTION  06/27/2022   Procedure: SUBMUCOSAL LIFTING INJECTION;  Surgeon: Marguerita Merles, Reuel Boom, MD;  [facility];  Service: Gastroenterology;;   SUBMUCOSAL TATTOO INJECTION  06/27/2022   Procedure: SUBMUCOSAL TATTOO INJECTION;  Surgeon: Marguerita Merles, Reuel Boom, MD;  [facility];  Service: Gastroenterology;;   TONSILLECTOMY  1954    Family History  Problem Relation Age of Onset   COPD Mother    Hearing loss Mother    Stroke Mother    Heart disease Mother        chf, died at 33   Vision loss Mother    Varicose Veins Mother    Arthritis Father    Hypertension Father    Heart disease Father        cad, pvd, died at 34   Vision loss Father    Heart disease Sister        heart valve   Arthritis Sister    Depression Sister     Hyperlipidemia Brother    Hypertension Brother    Cancer - Prostate Brother     Social History   Tobacco Use   Smoking status: Former    Packs/day: 1.00    Years: 40.00    Additional pack years: 0.00    Total pack years: 40.00    Types: Cigarettes    Start date: 11/12/1965    Quit date: 09/06/2008    Years since quitting: 14.6   Smokeless tobacco: Never  Vaping Use   Vaping Use: Never used  Substance Use Topics   Alcohol use: Yes    Alcohol/week: 2.0 standard drinks of alcohol    Types: 1 Glasses of wine, 1 Shots of liquor per week    Comment:  occasional   Drug use: Yes    Types: Hydrocodone, Marijuana    Comment: prescribed Percocet for myofacial pain syndrome    ROS   Objective:   Vitals: BP 127/74 (BP Location: Right Arm)   Pulse (!) 56   Temp 98 F (36.7 C) (Oral)   Resp 18   LMP 07/08/1979 (Approximate)   SpO2 96%   Physical Exam Constitutional:      General: She is not in acute distress.    Appearance: Normal appearance. She is well-developed. She is not ill-appearing, toxic-appearing or diaphoretic.  HENT:     Head: Normocephalic and atraumatic.     Nose: Nose normal.     Mouth/Throat:     Mouth: Mucous membranes are moist.  Eyes:     General: No scleral icterus.       Right eye: No discharge.        Left eye: No discharge.     Extraocular Movements: Extraocular movements intact.  Cardiovascular:     Rate and Rhythm: Normal rate.  Pulmonary:     Effort: Pulmonary effort is normal.  Skin:    General: Skin is warm and dry.       Neurological:     General: No focal deficit present.     Mental Status: She is alert and oriented to person, place, and time.  Psychiatric:        Mood and Affect: Mood normal.        Behavior: Behavior normal.     Assessment and Plan :   PDMP not reviewed this encounter.  1. Herpes zoster without complication    Rashes consistent with shingles.  Recommended valacyclovir and gabapentin.  There are minimal  interactions with her medications and the ones I am prescribing.  She requested printed prescriptions.  Counseled patient on potential for adverse effects with medications prescribed/recommended today, ER and return-to-clinic precautions discussed, patient verbalized understanding.    Wallis Bamberg, PA-C 04/26/23 1154

## 2023-04-26 NOTE — ED Triage Notes (Signed)
Pt reports she has a rash around her bra line mid back x 2 days. Pt states the site burns.

## 2023-05-01 DIAGNOSIS — R4701 Aphasia: Secondary | ICD-10-CM | POA: Diagnosis not present

## 2023-05-05 ENCOUNTER — Encounter: Payer: Self-pay | Admitting: Neurology

## 2023-05-06 ENCOUNTER — Other Ambulatory Visit: Payer: Self-pay | Admitting: *Deleted

## 2023-05-06 MED ORDER — OXYCODONE-ACETAMINOPHEN 5-325 MG PO TABS: ORAL_TABLET | ORAL | 0 refills | Status: DC

## 2023-05-06 NOTE — Telephone Encounter (Signed)
Pt sent mychart asking for refill on Percocet. Last seen 04/01/23 and next f/u 07/01/23. Last refilled 03/19/23 #30.

## 2023-05-14 ENCOUNTER — Encounter (HOSPITAL_COMMUNITY): Payer: Medicare Other | Admitting: Physical Therapy

## 2023-05-27 ENCOUNTER — Ambulatory Visit
Admission: EM | Admit: 2023-05-27 | Discharge: 2023-05-27 | Disposition: A | Discharge status: Home / Self Care | Payer: Medicare Other | Attending: Nurse Practitioner | Admitting: Nurse Practitioner

## 2023-05-27 DIAGNOSIS — S301XXA Contusion of abdominal wall, initial encounter: Secondary | ICD-10-CM

## 2023-05-27 MED ORDER — MUPIROCIN 2 % EX OINT: 1.0000 | TOPICAL_OINTMENT | Freq: Two times a day (BID) | CUTANEOUS | 0 refills | Status: DC

## 2023-05-27 NOTE — ED Provider Notes (Signed)
[facility]    CSN: [id number] Arrival date & time: 05/27/23  1252      History   Chief Complaint No chief complaint on file.   HPI Heather [name] is a 77 y.o. female.   The history is provided by the patient.   The patient presents for an induration to the left groin/labia area that she noticed this morning while showering.  Patient states the area is tender to touch.  The patient denies fever, chills, chest pain, abdominal pain, nausea, vomiting, diarrhea injury or trauma to the area.  Patient used any medication for her symptoms.  Past Medical History:  Diagnosis Date  . Adrenal adenoma   . Allergy   . Anemia   . Anxiety   . CAD (coronary artery disease)    a. 50-60% mid LAD at cardiac catheterization 2014 [facility]) b. non-obstructive CAD by cath in 09/2018  . Cataract   . Depression   . DJD (degenerative joint disease) of cervical spine 09/12/2016  . Dysrhythmia   . H. pylori infection 07/19/2017  . History of cardiomyopathy   . History of COVID-19 12/10/2019  . History of tobacco use   . Hyperlipidemia   . Hypothyroidism 08/24/2022  . Mid back pain 02/08/2017  . Neuromuscular disorder (HCC)   . PAF (paroxysmal atrial fibrillation) [facility])    Diagnosis July 2017 - spontaneously resolved  . Smell disturbance 06/13/2021  . Statin intolerance     Patient Active Problem List   Diagnosis Date Noted  . Cerebral atrophy (HCC) 02/13/2023  . Foot and ankle pain 09/26/2022  . Hypothyroidism 08/24/2022  . Cyst of buttocks 05/04/2022  . DOE (dyspnea on exertion) 10/09/2021  . Post-COVID syndrome 07/31/2021  . Dysphagia 07/10/2021  . Tubular adenoma of colon 07/19/2017  . Hx of colonic polyps 12/03/2016  . Family history of colon cancer 12/03/2016  . Encounter for therapeutic drug monitoring 11/21/2016  . CAD (coronary artery disease) 09/12/2016  . PAF (paroxysmal atrial fibrillation) (HCC) 09/12/2016  . DJD (degenerative joint disease) of cervical  spine 09/12/2016  . Statin intolerance 09/12/2016  . TIA (transient ischemic attack) 09/12/2016  . Diverticulitis of colon 09/12/2016  . Myofascial pain syndrome 09/06/2016  . Takotsubo syndrome 09/06/2016  . HLD (hyperlipidemia) 09/06/2016  . GAD (generalized anxiety disorder) 09/06/2016  . Recurrent UTI (urinary tract infection) 09/06/2016    Past Surgical History:  Procedure Laterality Date  . ABDOMINAL HYSTERECTOMY  1980   endometriosis  . BIOPSY  07/18/2021   Procedure: BIOPSY;  Surgeon: Dolores Frame, MD;  [facility];  Service: Gastroenterology;;  . CATARACT EXTRACTION Left 11/2018  . COLONOSCOPY WITH PROPOFOL N/A 06/27/2022   Procedure: COLONOSCOPY WITH PROPOFOL;  Surgeon: Dolores Frame, MD;  [facility];  Service: Gastroenterology;  Laterality: N/A;  11:15  . CORONARY PRESSURE/FFR STUDY N/A 06/01/2021   Procedure: INTRAVASCULAR PRESSURE WIRE/FFR STUDY;  Surgeon: Lyn Records, MD;  [facility];  Service: Cardiovascular;  Laterality: N/A;  . ESOPHAGOGASTRODUODENOSCOPY (EGD) WITH PROPOFOL N/A 07/18/2021   Procedure: ESOPHAGOGASTRODUODENOSCOPY (EGD) WITH PROPOFOL;  Surgeon: Dolores Frame, MD;  [facility];  Service: Gastroenterology;  Laterality: N/A;  12:00  . EYE SURGERY  cataracts  2021/2022  . HEMOSTASIS CLIP PLACEMENT  06/27/2022   Procedure: HEMOSTASIS CLIP PLACEMENT;  Surgeon: Dolores Frame, MD;  [facility];  Service: Gastroenterology;;  . LEFT HEART CATH AND CORONARY ANGIOGRAPHY N/A 09/25/2018   Procedure: LEFT HEART CATH  AND CORONARY ANGIOGRAPHY;  Surgeon: Lyn Records, MD;  Location: [facility] INVASIVE CV LAB;  Service: Cardiovascular;  Laterality: N/A;  . LEFT HEART CATH AND CORONARY ANGIOGRAPHY N/A 06/01/2021   Procedure: LEFT HEART CATH AND CORONARY ANGIOGRAPHY;  Surgeon: Lyn Records, MD;  Location: [facility];  Service: Cardiovascular;  Laterality:  N/A;  . POLYPECTOMY  06/27/2022   Procedure: POLYPECTOMY;  Surgeon: Marguerita Merles, Reuel Boom, MD;  [facility];  Service: Gastroenterology;;  . Gaspar Bidding DILATION  07/18/2021   Procedure: Gaspar Bidding DILATION;  Surgeon: Marguerita Merles, Reuel Boom, MD;  [facility];  Service: Gastroenterology;;  . Sunnie Nielsen LIFTING INJECTION  06/27/2022   Procedure: SUBMUCOSAL LIFTING INJECTION;  Surgeon: Dolores Frame, MD;  [facility];  Service: Gastroenterology;;  . Sunnie Nielsen TATTOO INJECTION  06/27/2022   Procedure: SUBMUCOSAL TATTOO INJECTION;  Surgeon: Dolores Frame, MD;  [facility];  Service: Gastroenterology;;  . TONSILLECTOMY  1954    OB History     Gravida  1   Para  0   Term      Preterm      AB  1   Living  0      SAB      IAB  1   Ectopic      Multiple      Live Births               Home Medications    Prior to Admission medications   Medication Sig Start Date End Date Taking? Authorizing Provider  mupirocin ointment (BACTROBAN) 2 % Apply 1 Application topically 2 (two) times daily. 05/27/23  Yes Jaymie Misch-Warren, Sadie Haber, NP  ALPRAZolam Prudy Feeler) 0.5 MG tablet Take 1-2 tablets 30 min prior to MRI. Can take additional tablet at time of test if needed. Must have driver to and from test. Can cause drowsiness. 01/15/23   Sater, Pearletha Furl, MD  buPROPion (WELLBUTRIN SR) 150 MG 12 hr tablet TAKE ONE TABLET BY MOUTH TWICE A DAY 06/02/21   Lilly Cove C, MD  Cholecalciferol (VITAMIN D3) 125 MCG (5000 UT) TABS Take 5,000 Units by mouth daily at 12 noon.    [provider]  Coenzyme Q10 100 MG capsule Take 100 mg by mouth daily at 12 noon.    [provider]  diazepam (VALIUM) 5 MG tablet Take 1 tablet (5 mg total) by mouth daily as needed for anxiety or muscle spasms. 06/20/22   Sater, Pearletha Furl, MD  docusate sodium (COLACE) 100 MG capsule Take 100 mg by mouth 2 (two) times daily.     [provider]  estradiol (ESTRACE) 1 MG tablet Take 1 mg by mouth daily.    [provider]  gabapentin (NEURONTIN) 100 MG capsule Take 1 capsule (100 mg total) by mouth 3 (three) times daily. 04/26/23   Wallis Bamberg, PA-C  inclisiran (LEQVIO) 284 MG/1.5ML SOSY injection Inject 284 mg into the skin once.    [provider]  nitroGLYCERIN (NITROSTAT) 0.4 MG SL tablet Place 1 tablet under the tongue every 5 minutes as needed for chest pain. 09/12/21   Jonelle Sidle, MD  oxyCODONE-acetaminophen (PERCOCET/ROXICET) 5-325 MG tablet TAKE 1 TABLET DAILY AS NEEDED FOR SEVERE PAIN. 05/06/23   Sater, Pearletha Furl, MD  progesterone (PROMETRIUM) 200 MG capsule Take 1 capsule (200 mg total) by mouth daily. 02/21/21   Wilson Singer, MD  thyroid (NP THYROID) 60 MG tablet Take 1 tablet (60 mg total)  by mouth daily before breakfast. Patient not taking: Reported on 04/01/2023 02/13/23   Anabel Halon, MD  valACYclovir (VALTREX) 1000 MG tablet Take 1 tablet (1,000 mg total) by mouth 3 (three) times daily. 04/26/23   Wallis Bamberg, PA-C  warfarin (COUMADIN) 5 MG tablet Take 1 tablet to 1 1/2 tablets daily as directed by coumadin clinic 11/07/22   Jonelle Sidle, MD    Family History Family History  Problem Relation Age of Onset  . COPD Mother   . Hearing loss Mother   . Stroke Mother   . Heart disease Mother        chf, died at 67  . Vision loss Mother   . Varicose Veins Mother   . Arthritis Father   . Hypertension Father   . Heart disease Father        cad, pvd, died at 44  . Vision loss Father   . Heart disease Sister        heart valve  . Arthritis Sister   . Depression Sister   . Hyperlipidemia Brother   . Hypertension Brother   . Cancer - Prostate Brother     Social History Social History   Tobacco Use  . Smoking status: Former    Current packs/day: 0.00    Average packs/day: 1 pack/day for 42.8 years (42.8 ttl pk-yrs)    Types: Cigarettes    Start date: 11/12/1965     Quit date: 09/06/2008    Years since quitting: 14.7  . Smokeless tobacco: Never  Vaping Use  . Vaping status: Never Used  Substance Use Topics  . Alcohol use: Yes    Alcohol/week: 2.0 standard drinks of alcohol    Types: 1 Glasses of wine, 1 Shots of liquor per week    Comment: occasional  . Drug use: Yes    Types: Hydrocodone, Marijuana    Comment: prescribed Percocet for myofacial pain syndrome     Allergies   Statins, Other, and Sulfa antibiotics   Review of Systems Review of Systems Per HPI  Physical Exam Triage Vital Signs ED Triage Vitals  Encounter Vitals Group     BP 05/27/23 1355 (!) 156/72     Systolic BP Percentile --      Diastolic BP Percentile --      Pulse Rate 05/27/23 1355 64     Resp 05/27/23 1355 20     Temp 05/27/23 1355 98.2 F (36.8 C)     Temp Source 05/27/23 1355 Oral     SpO2 05/27/23 1355 94 %     Weight --      Height --      Head Circumference --      Peak Flow --      Pain Score 05/27/23 1357 2     Pain Loc --      Pain Education --      Exclude from Growth Chart --    No data found.  Updated Vital Signs BP (!) 156/72 (BP Location: Right Arm)   Pulse 64   Temp 98.2 F (36.8 C) (Oral)   Resp 20   LMP 07/08/1979 (Approximate)   SpO2 94%   Visual Acuity Right Eye Distance:   Left Eye Distance:   Bilateral Distance:    Right Eye Near:   Left Eye Near:    Bilateral Near:     Physical Exam Vitals and nursing note reviewed.  Constitutional:      General: She is not in  acute distress.    Appearance: Normal appearance.  HENT:     Head: Normocephalic.  Eyes:     Extraocular Movements: Extraocular movements intact.     Conjunctiva/sclera: Conjunctivae normal.     Pupils: Pupils are equal, round, and reactive to light.  Pulmonary:     Effort: Pulmonary effort is normal.  Genitourinary:    Labia:        Left: Tenderness present.     Musculoskeletal:     Cervical back: Normal range of motion.  Skin:    General: Skin  is warm and dry.  Neurological:     General: No focal deficit present.     Mental Status: She is alert and oriented to person, place, and time.  Psychiatric:        Mood and Affect: Mood normal.        Behavior: Behavior normal.    UC Treatments / Results  Labs (all labs ordered are listed, but only abnormal results are displayed) Labs Reviewed - No data to display  EKG   Radiology No results found.  Procedures Procedures (including critical care time)  Medications Ordered in UC Medications - No data to display  Initial Impression / Assessment and Plan / UC Course  I have reviewed the triage vital signs and the nursing notes.  Pertinent labs & imaging results that were available during my care of the patient were reviewed by me and considered in my medical decision making (see chart for details).  The patient is well-appearing, she is in no acute distress, vital signs are stable.   *** Final Clinical Impressions(s) / UC Diagnoses   Final diagnoses:  Hematoma of groin, initial encounter     Discharge Instructions      Apply medication as prescribed. Warm compresses to the affected area 3-4 times daily. May apply cool compresses for pain and swelling.  Clean the area at least twice daily with Dial Gold bar soap or another type of antibacterial soap. Keep the area covered if it begins to drain. Go to the emergency department if you develop fever, chills, generalized fatigue, nausea, vomiting, or if the area of redness spreads into the genital region, or if you have foul-smelling drainage.  Follow-up if symptoms worsen or fail to improve. Follow-up as needed.   ED Prescriptions     Medication Sig Dispense Auth. Provider   mupirocin ointment (BACTROBAN) 2 % Apply 1 Application topically 2 (two) times daily. 30 g Zyshonne Malecha-Warren, Sadie Haber, NP      PDMP not reviewed this encounter.

## 2023-05-27 NOTE — ED Triage Notes (Signed)
Pt reports she has an abscess on her groin area since this morning states the area is raised and dark.     Need paper script if applicable

## 2023-05-27 NOTE — Discharge Instructions (Addendum)
Apply medication as prescribed. Warm compresses to the affected area 3-4 times daily. May apply cool compresses for pain and swelling.  Clean the area at least twice daily with Dial Gold bar soap or another type of antibacterial soap. Keep the area covered if it begins to drain. Go to the emergency department if you develop fever, chills, generalized fatigue, nausea, vomiting, or if the area of redness spreads into the genital region, or if you have foul-smelling drainage.  Follow-up if symptoms worsen or fail to improve. Follow-up as needed.

## 2023-06-03 ENCOUNTER — Ambulatory Visit: Payer: Medicare Other | Admitting: Cardiology

## 2023-06-06 ENCOUNTER — Other Ambulatory Visit: Payer: Self-pay | Admitting: Internal Medicine

## 2023-06-06 ENCOUNTER — Encounter: Payer: Self-pay | Admitting: Internal Medicine

## 2023-06-06 DIAGNOSIS — U071 COVID-19: Secondary | ICD-10-CM | POA: Insufficient documentation

## 2023-06-06 MED ORDER — NIRMATRELVIR/RITONAVIR (PAXLOVID)TABLET
3.0000 | ORAL_TABLET | Freq: Two times a day (BID) | ORAL | 0 refills | Status: AC
Start: 2023-06-06 — End: 2023-06-11

## 2023-06-07 ENCOUNTER — Telehealth: Payer: Medicare Other | Admitting: Internal Medicine

## 2023-06-10 ENCOUNTER — Ambulatory Visit (HOSPITAL_COMMUNITY): Payer: Medicare Other

## 2023-06-12 ENCOUNTER — Other Ambulatory Visit: Payer: Self-pay | Admitting: Internal Medicine

## 2023-06-12 ENCOUNTER — Telehealth: Payer: Self-pay | Admitting: Cardiology

## 2023-06-12 DIAGNOSIS — E039 Hypothyroidism, unspecified: Secondary | ICD-10-CM

## 2023-06-12 DIAGNOSIS — E785 Hyperlipidemia, unspecified: Secondary | ICD-10-CM

## 2023-06-12 MED ORDER — THYROID 60 MG PO TABS
60.0000 mg | ORAL_TABLET | Freq: Every day | ORAL | 0 refills | Status: DC
Start: 2023-06-12 — End: 2023-09-08

## 2023-06-12 NOTE — Telephone Encounter (Signed)
Patient advised to have labs done before her next injection per SM

## 2023-06-12 NOTE — Telephone Encounter (Signed)
Pt c/o medication issue:  1. Name of Medication:   inclisiran (LEQVIO) 284 MG/1.5ML SOSY injection   2. How are you currently taking this medication (dosage and times per day)?   As prescribed  3. Are you having a reaction (difficulty breathing--STAT)?   No  4. What is your medication issue?   Patient stated she will be taking her 2nd dose of this medication on 8/12.  Patient wants to know when her next lipid panel draw should be and should it be before or after her visit scheduled on 8/7.

## 2023-06-17 DIAGNOSIS — B353 Tinea pedis: Secondary | ICD-10-CM | POA: Diagnosis not present

## 2023-06-17 DIAGNOSIS — L821 Other seborrheic keratosis: Secondary | ICD-10-CM | POA: Diagnosis not present

## 2023-06-17 DIAGNOSIS — L304 Erythema intertrigo: Secondary | ICD-10-CM | POA: Diagnosis not present

## 2023-06-17 DIAGNOSIS — E785 Hyperlipidemia, unspecified: Secondary | ICD-10-CM | POA: Diagnosis not present

## 2023-06-17 DIAGNOSIS — L988 Other specified disorders of the skin and subcutaneous tissue: Secondary | ICD-10-CM | POA: Diagnosis not present

## 2023-06-17 DIAGNOSIS — L568 Other specified acute skin changes due to ultraviolet radiation: Secondary | ICD-10-CM | POA: Diagnosis not present

## 2023-06-17 DIAGNOSIS — X32XXXA Exposure to sunlight, initial encounter: Secondary | ICD-10-CM | POA: Diagnosis not present

## 2023-06-17 NOTE — Addendum Note (Signed)
Addended by: Kerney Elbe on: 06/17/2023 09:51 AM   Modules accepted: Orders

## 2023-06-19 ENCOUNTER — Encounter: Payer: Self-pay | Admitting: Cardiology

## 2023-06-19 ENCOUNTER — Ambulatory Visit: Payer: Medicare Other | Attending: Cardiology | Admitting: Cardiology

## 2023-06-19 VITALS — BP 142/82 | HR 57 | Ht 64.0 in

## 2023-06-19 DIAGNOSIS — I480 Paroxysmal atrial fibrillation: Secondary | ICD-10-CM

## 2023-06-19 DIAGNOSIS — E7849 Other hyperlipidemia: Secondary | ICD-10-CM | POA: Diagnosis not present

## 2023-06-19 NOTE — Patient Instructions (Signed)
Medication Instructions:  Your physician recommends that you continue on your current medications as directed. Please refer to the Current Medication list given to you today.   Labwork: None today  Testing/Procedures: None today  Follow-Up: 6 months  Any Other Special Instructions Will Be Listed Below (If Applicable).  If you need a refill on your cardiac medications before your next appointment, please call your pharmacy.  

## 2023-06-19 NOTE — Progress Notes (Signed)
    Cardiology Office Note  Date: 06/19/2023   ID: Kinli Woltz, DOB 1946/04/26, MRN [id number]  History of Present Illness: Heather [name] is a 77 y.o. female last seen by Ms. Peck NP in December 2023 with subsequent evaluation by Dr. Rennis Golden in April, I reviewed both notes (our last visit was in 2021).  She is here for a follow-up visit.  States that she is getting over a recent bout with COVID, took Paxlovid treatment.  Still feels fatigued and some sense of pressure in her chest.  No cough however.  No fevers or chills.  She remains on Coumadin with follow-up in the anticoagulation clinic.  No reported spontaneous bleeding problems.  I reviewed her medications.  She has done well on Leqvio so far, LDL has come down to 157 from 260 after a single injection.  Physical Exam: VS:  BP (!) 142/82   Pulse (!) 57   Ht 5\' 4"  (1.626 m)   LMP 07/08/1979 (Approximate)   SpO2 98%   BMI 27.12 kg/m , BMI Body mass index is 27.12 kg/m.  Wt Readings from Last 3 Encounters:  03/05/23 158 lb (71.7 kg)  11/26/22 160 lb (72.6 kg)  10/08/22 158 lb (71.7 kg)    General: Patient appears comfortable at rest. HEENT: Conjunctiva and lids normal. Neck: Supple, no elevated JVP or carotid bruits. Lungs: Clear to auscultation, nonlabored breathing at rest. Cardiac: Regular rate and rhythm, no S3 or significant systolic murmur, no pericardial rub.  ECG:  An ECG dated 11/07/2022 was personally reviewed today and demonstrated:  Sinus rhythm with nonspecific ST changes.  Labwork: 08/23/2022: BUN 24; Creatinine, Ser 0.82; Hemoglobin 11.8; Platelets 230; Potassium 4.8; Sodium 142 01/28/2023: TSH 6.100     Component Value Date/Time   CHOL 250 (H) 06/17/2023 1001   TRIG 229 (H) 06/17/2023 1001   HDL 51 06/17/2023 1001   CHOLHDL 4.9 (H) 06/17/2023 1001   CHOLHDL 4.6 04/05/2021 1414   LDLCALC 157 (H) 06/17/2023 1001   LDLCALC 169 (H) 04/05/2021 1414   Other Studies Reviewed Today:  No interval cardiac  testing for review today.  Assessment and Plan:  1.  Nonobstructive CAD documented at cardiac catheterization in 2022.  2.  Paroxysmal atrial fibrillation with CHA2DS2-VASc score of 4.  She is on Coumadin for stroke prophylaxis.  She reports occasional palpitations, no progressive symptoms.  3.  Essential hypertension.  Currently not on specific antihypertensive therapy.  She does plan to work on weight loss and getting back to her regular exercise plan.  4.  Familial hyperlipidemia.  She has a history of statin intolerance and has been on Leqvio most recently with LDL down to 157 from 260 initially.  5.  History of stress-induced cardiomyopathy.  LVEF 55 to 60% without regional wall motion abnormalities by follow-up echocardiogram in July 2022.  If post-COVID symptoms do not continue to improve, would recommend getting a follow-up echocardiogram.  Disposition:  Follow up  6 months.  Signed, Jonelle Sidle, M.D., F.A.C.C. [facility]

## 2023-06-24 ENCOUNTER — Ambulatory Visit (INDEPENDENT_AMBULATORY_CARE_PROVIDER_SITE_OTHER): Payer: Medicare Other

## 2023-06-24 ENCOUNTER — Other Ambulatory Visit: Payer: Self-pay | Admitting: Neurology

## 2023-06-24 VITALS — BP 143/78 | HR 63 | Temp 97.5°F | Resp 20 | Ht 64.0 in

## 2023-06-24 DIAGNOSIS — E782 Mixed hyperlipidemia: Secondary | ICD-10-CM

## 2023-06-24 MED ORDER — INCLISIRAN SODIUM 284 MG/1.5ML ~~LOC~~ SOSY
284.0000 mg | PREFILLED_SYRINGE | Freq: Once | SUBCUTANEOUS | Status: AC
  Administered 2023-06-24: 284 mg via SUBCUTANEOUS
  Filled 2023-06-24: qty 1.5

## 2023-06-24 MED ORDER — OXYCODONE-ACETAMINOPHEN 5-325 MG PO TABS: ORAL_TABLET | ORAL | 0 refills | Status: DC

## 2023-06-24 NOTE — Telephone Encounter (Signed)
Last seen on 04/01/23 Follow up scheduled on 07/01/23 Last filled on 05/06/23 #30 tablets. Rx pending to be signed

## 2023-06-24 NOTE — Progress Notes (Signed)
Diagnosis: Hyperlipidemia  Provider:  Mannam, Praveen MD  Procedure: Injection  Leqvio (inclisiran), Dose: 284 mg, Site: subcutaneous, Number of injections: 1  Administered in left arm.  Post Care: Patient declined observation  Discharge: Condition: Good, Destination: Home . AVS Declined  Performed by:  Erin E Evans, RN        

## 2023-06-26 ENCOUNTER — Ambulatory Visit: Payer: Medicare Other | Attending: Cardiology | Admitting: *Deleted

## 2023-06-26 DIAGNOSIS — Z5181 Encounter for therapeutic drug level monitoring: Secondary | ICD-10-CM | POA: Diagnosis not present

## 2023-06-26 DIAGNOSIS — I480 Paroxysmal atrial fibrillation: Secondary | ICD-10-CM | POA: Insufficient documentation

## 2023-06-26 LAB — POCT INR: INR: 2.9 (ref 2.0–3.0)

## 2023-06-26 MED ORDER — WARFARIN SODIUM 5 MG PO TABS
ORAL_TABLET | ORAL | 1 refills | Status: DC
Start: 2023-06-26 — End: 2024-01-13

## 2023-06-26 NOTE — Patient Instructions (Signed)
Continue warfarin 1 1/2 tablets daily except 1 tablet on Sundays, Tuesdays and Thursdays Recheck INR in 4 wks.

## 2023-07-01 ENCOUNTER — Ambulatory Visit (INDEPENDENT_AMBULATORY_CARE_PROVIDER_SITE_OTHER): Payer: Medicare Other | Admitting: Neurology

## 2023-07-01 ENCOUNTER — Encounter: Payer: Self-pay | Admitting: Neurology

## 2023-07-01 VITALS — BP 143/66 | HR 56 | Ht 64.0 in

## 2023-07-01 DIAGNOSIS — R4701 Aphasia: Secondary | ICD-10-CM

## 2023-07-01 DIAGNOSIS — M7918 Myalgia, other site: Secondary | ICD-10-CM

## 2023-07-01 DIAGNOSIS — M549 Dorsalgia, unspecified: Secondary | ICD-10-CM | POA: Diagnosis not present

## 2023-07-01 DIAGNOSIS — R42 Dizziness and giddiness: Secondary | ICD-10-CM | POA: Diagnosis not present

## 2023-07-01 DIAGNOSIS — R4789 Other speech disturbances: Secondary | ICD-10-CM | POA: Diagnosis not present

## 2023-07-01 DIAGNOSIS — R419 Unspecified symptoms and signs involving cognitive functions and awareness: Secondary | ICD-10-CM

## 2023-07-01 DIAGNOSIS — U099 Post covid-19 condition, unspecified: Secondary | ICD-10-CM | POA: Diagnosis not present

## 2023-07-01 DIAGNOSIS — M62838 Other muscle spasm: Secondary | ICD-10-CM

## 2023-07-01 DIAGNOSIS — R439 Unspecified disturbances of smell and taste: Secondary | ICD-10-CM

## 2023-07-01 MED ORDER — ONABOTULINUMTOXINA 100 UNITS IJ SOLR
100.0000 [IU] | Freq: Once | INTRAMUSCULAR | Status: AC
Start: 2023-07-01 — End: 2023-07-01
  Administered 2023-07-01: 100 [IU] via INTRAMUSCULAR

## 2023-07-01 NOTE — Progress Notes (Signed)
[facility]  PATIENT: Heather [name] DOB: 09-16-1946  REFERRING DOCTOR OR PCP:  Rica Mast SOURCE: patient  _________________________________   HISTORICAL  CHIEF COMPLAINT:  Chief Complaint  Patient presents with   Injections    Rm 10. Patient with husband and no questions about injections at this time.     HISTORY OF PRESENT ILLNESS:  Heather [name] is a 77 y.o. woman who has had chronic right-sided mid back pain since 2013.      Update 85/20/2024 She had formal neurocognitive testing and did not have any neurocognitive deficits.    She is not noting the language issues as much but still feels processing is reduced.  She feels exhausted.    She also notes reduced smell and taste.    She feels she has long Covid.   She is often dizzy.     She had noted some mild cognitive issues in 2022.  MRI of the brain showed atrophy.   There is no lobar predominant pattern.    Her myofascial pain in the right trunk has done well since the last Botox injection.  Pain fluctuates but is much better since Botox.    When she has more of the myofascial pain pain, she will take half of a Percocet.  If she notes more spasticity she will take half of Valium.  Prescriptions have lasted multiple months.  Besides working part-time at [employer], she notes being fairly active and does gardening.  She sleeps well   She never combines with a percocet.    She is on coumadin for atrial fibrillation.  If she notes more palpitations, she takes a valium (rare).     She had Covid-19 again (#3).  She felt very dizzy afterwards,    IMAGING:  MRI results from 10/14/2013: The MRI of the brain showed age related atrophy and minimal chronic microvascular ischemic change. MRI of the cervical spine showed multilevel mild degenerative changes with left paramedian disc herniation at C3-C4 and left disc protrusion at C4-C5 and C5-C6 and midline disc herniation at C6-C7. There was no report of  nerve root compression. MRI of the thoracic spine showed disc desiccation but no herniation or protrusions. MRI of the lumbar spine showed disc bulges at T12-L1 and L2-L3 and disc bulge with facet hypertrophy at L3-L4 and disc bulge with right foraminal annular tear at L4-L5.  MRI brain 7/4/2-22 was personally reviewed.  It shows mild generalized atrophy.  No acute findings.     REVIEW OF SYSTEMS: Constitutional: No fevers, chills, sweats, or change in appetite Eyes: No visual changes, double vision, eye pain Ear, nose and throat: No hearing loss, ear pain, nasal congestion, sore throat Cardiovascular: No chest pain, palpitations.   She has atrial fibrillation and is on warfarin Respiratory:  No shortness of breath at rest or with exertion.   No wheezes GastrointestinaI: No nausea, vomiting, diarrhea, abdominal pain, fecal incontinence Genitourinary:  No dysuria, urinary retention or frequency.  No nocturia. Musculoskeletal:  No neck pain.  She has back pain/thoracic pain as above Integumentary: No rash, pruritus, skin lesions Neurological: as above Psychiatric: No depression at this time.  No anxiety Endocrine: No palpitations, diaphoresis, change in appetite, change in weigh or increased thirst Hematologic/Lymphatic:  No anemia, purpura, petechiae. Allergic/Immunologic: No itchy/runny eyes, nasal congestion, recent allergic reactions, rashes  ALLERGIES: Allergies  Allergen Reactions   Statins Other (See Comments)    Muscle weakness   Other     EKG pads causes blisters  Sulfa Antibiotics Rash    HOME MEDICATIONS:  Current Outpatient Medications:    buPROPion (WELLBUTRIN SR) 150 MG 12 hr tablet, TAKE ONE TABLET BY MOUTH TWICE A DAY, Disp: 180 tablet, Rfl: 1   Cholecalciferol (VITAMIN D3) 125 MCG (5000 UT) TABS, Take 5,000 Units by mouth daily at 12 noon., Disp: , Rfl:    Coenzyme Q10 100 MG capsule, Take 100 mg by mouth daily at 12 noon., Disp: , Rfl:    diazepam (VALIUM) 5 MG  tablet, Take 1 tablet (5 mg total) by mouth daily as needed for anxiety or muscle spasms., Disp: 30 tablet, Rfl: 0   docusate sodium (COLACE) 100 MG capsule, Take 100 mg by mouth 2 (two) times daily. , Disp: , Rfl:    estradiol (ESTRACE) 1 MG tablet, Take 1 mg by mouth daily., Disp: , Rfl:    inclisiran (LEQVIO) 284 MG/1.5ML SOSY injection, Inject 284 mg into the skin once., Disp: , Rfl:    mupirocin ointment (BACTROBAN) 2 %, Apply 1 Application topically 2 (two) times daily., Disp: 30 g, Rfl: 0   nitroGLYCERIN (NITROSTAT) 0.4 MG SL tablet, Place 1 tablet under the tongue every 5 minutes as needed for chest pain., Disp: 25 tablet, Rfl: 1   oxyCODONE-acetaminophen (PERCOCET/ROXICET) 5-325 MG tablet, TAKE 1 TABLET DAILY AS NEEDED FOR SEVERE PAIN., Disp: 30 tablet, Rfl: 0   progesterone (PROMETRIUM) 200 MG capsule, Take 1 capsule (200 mg total) by mouth daily., Disp: 90 capsule, Rfl: 1   thyroid (NP THYROID) 60 MG tablet, Take 1 tablet (60 mg total) by mouth daily before breakfast., Disp: 90 tablet, Rfl: 0   warfarin (COUMADIN) 5 MG tablet, Take 1 tablet to 1 1/2 tablets daily as directed by coumadin clinic, Disp: 135 tablet, Rfl: 1  PAST MEDICAL HISTORY: Past Medical History:  Diagnosis Date   Adrenal adenoma    Allergy    Anemia    Anxiety    CAD (coronary artery disease)    a. 50-60% mid LAD at cardiac catheterization 2014 [facility]) b. non-obstructive CAD by cath in 09/2018   Cataract    Depression    DJD (degenerative joint disease) of cervical spine 09/12/2016   Dysrhythmia    H. pylori infection 07/19/2017   History of cardiomyopathy    History of COVID-19 12/10/2019   History of tobacco use    Hyperlipidemia    Hypothyroidism 08/24/2022   Mid back pain 02/08/2017   Neuromuscular disorder (HCC)    PAF (paroxysmal atrial fibrillation) (HCC)    Diagnosis July 2017 - spontaneously resolved   Smell disturbance 06/13/2021   Statin intolerance     PAST SURGICAL HISTORY: Past  Surgical History:  Procedure Laterality Date   ABDOMINAL HYSTERECTOMY  1980   endometriosis   BIOPSY  07/18/2021   Procedure: BIOPSY;  Surgeon: Dolores Frame, MD;  [facility]: [facility];  Service: Gastroenterology;;   CATARACT EXTRACTION Left 11/2018   COLONOSCOPY WITH PROPOFOL N/A 06/27/2022   Procedure: COLONOSCOPY WITH PROPOFOL;  Surgeon: Dolores Frame, MD;  Location: [facility];  Service: Gastroenterology;  Laterality: N/A;  11:15   CORONARY PRESSURE/FFR STUDY N/A 06/01/2021   Procedure: INTRAVASCULAR PRESSURE WIRE/FFR STUDY;  Surgeon: Lyn Records, MD;  Location: [facility];  Service: Cardiovascular;  Laterality: N/A;   ESOPHAGOGASTRODUODENOSCOPY (EGD) WITH PROPOFOL N/A 07/18/2021   Procedure: ESOPHAGOGASTRODUODENOSCOPY (EGD) WITH PROPOFOL;  Surgeon: Dolores Frame, MD;  [facility]: [facility];  Service: Gastroenterology;  Laterality: N/A;  12:00  EYE SURGERY  cataracts  2021/2022   HEMOSTASIS CLIP PLACEMENT  06/27/2022   Procedure: HEMOSTASIS CLIP PLACEMENT;  Surgeon: Dolores Frame, MD;  [facility]: [facility];  Service: Gastroenterology;;   LEFT HEART CATH AND CORONARY ANGIOGRAPHY N/A 09/25/2018   Procedure: LEFT HEART CATH AND CORONARY ANGIOGRAPHY;  Surgeon: Lyn Records, MD;  Location: [facility];  Service: Cardiovascular;  Laterality: N/A;   LEFT HEART CATH AND CORONARY ANGIOGRAPHY N/A 06/01/2021   Procedure: LEFT HEART CATH AND CORONARY ANGIOGRAPHY;  Surgeon: Lyn Records, MD;  Location: [facility];  Service: Cardiovascular;  Laterality: N/A;   POLYPECTOMY  06/27/2022   Procedure: POLYPECTOMY;  Surgeon: Dolores Frame, MD;  [facility];  Service: Gastroenterology;;   Gaspar Bidding DILATION  07/18/2021   Procedure: Gaspar Bidding DILATION;  Surgeon: Marguerita Merles, Reuel Boom, MD;  [facility]: [facility];  Service: Gastroenterology;;   SUBMUCOSAL LIFTING INJECTION  06/27/2022   Procedure:  SUBMUCOSAL LIFTING INJECTION;  Surgeon: Marguerita Merles, Reuel Boom, MD;  [facility];  Service: Gastroenterology;;   SUBMUCOSAL TATTOO INJECTION  06/27/2022   Procedure: SUBMUCOSAL TATTOO INJECTION;  Surgeon: Marguerita Merles, Reuel Boom, MD;  [facility]: [facility];  Service: Gastroenterology;;   TONSILLECTOMY  1954    FAMILY HISTORY: Family History  Problem Relation Age of Onset   COPD Mother    Hearing loss Mother    Stroke Mother    Heart disease Mother        chf, died at 63   Vision loss Mother    Varicose Veins Mother    Arthritis Father    Hypertension Father    Heart disease Father        cad, pvd, died at 54   Vision loss Father    Heart disease Sister        heart valve   Arthritis Sister    Depression Sister    Hyperlipidemia Brother    Hypertension Brother    Cancer - Prostate Brother     SOCIAL HISTORY:  Social History   Socioeconomic History   Marital status: Married    Spouse name: Al   Number of children: 0   Years of education: 18   Highest education level: Master's degree (e.g., MA, MS, MEng, MEd, MSW, MBA)  Occupational History   Occupation: Clinical research associate    Comment: from home  Tobacco Use   Smoking status: Former    Current packs/day: 0.00    Average packs/day: 1 pack/day for 42.8 years (42.8 ttl pk-yrs)    Types: Cigarettes    Start date: 11/12/1965    Quit date: 09/06/2008    Years since quitting: 14.8   Smokeless tobacco: Never  Vaping Use   Vaping status: Never Used  Substance and Sexual Activity   Alcohol use: Yes    Alcohol/week: 2.0 standard drinks of alcohol    Types: 1 Glasses of wine, 1 Shots of liquor per week    Comment: occasional   Drug use: Yes    Types: Hydrocodone, Marijuana    Comment: prescribed Percocet for myofacial pain syndrome   Sexual activity: Yes    Birth control/protection: Post-menopausal  Other Topics Concern   Not on file  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   Lives with husband AL   No  children   Healthy diet and lifestyle   Social Determinants of Health   Financial Resource Strain: Low Risk  (02/09/2023)   Overall Financial Resource Strain (CARDIA)  Difficulty of Paying Living Expenses: Not very hard  Food Insecurity: No Food Insecurity (02/09/2023)   Hunger Vital Sign    Worried About Running Out of Food in the Last Year: Never true    Ran Out of Food in the Last Year: Never true  Transportation Needs: No Transportation Needs (02/09/2023)   PRAPARE - Administrator, Civil Service (Medical): No    Lack of Transportation (Non-Medical): No  Physical Activity: Insufficiently Active (02/09/2023)   Exercise Vital Sign    Days of Exercise per Week: 3 days    Minutes of Exercise per Session: 30 min  Stress: Stress Concern Present (02/09/2023)   [employer] of Occupational Health - Occupational Stress Questionnaire    Feeling of Stress : To some extent  Social Connections: Moderately Integrated (02/09/2023)   Social Connection and Isolation Panel [NHANES]    Frequency of Communication with Friends and Family: More than three times a week    Frequency of Social Gatherings with Friends and Family: Twice a week    Attends Religious Services: Never    Database administrator or Organizations: Yes    Attends Banker Meetings: 1 to 4 times per year    Marital Status: Married  Catering manager Violence: Not At Risk (09/13/2021)   Humiliation, Afraid, Rape, and Kick questionnaire    Fear of Current or Ex-Partner: No    Emotionally Abused: No    Physically Abused: No    Sexually Abused: No     PHYSICAL EXAM   General: The patient is well-developed and well-nourished and in no acute distress.  Head is normocephalic and atraumatic.   Musculoskeletal : There is tenderness in the mid to lower thoracic intercoastal muscles from T6-T9 on the right .  The neck was nontender today.  Neurologic Exam  Mental status: The patient is alert and oriented x  3 at the time of the examination.   Focus and attention seem normal.  Speech appeared normal.  No aphasia was noted during the conversation and she had no problems, with the right words Cranial nerves: Extraocular muscles are intact.  Facial strength is normal.   . No obvious hearing deficits are noted.  Motor: She has normal muscle tone, muscle bulk and muscle strength in the arms or legs.  gait and station: Gait and station are normal.  The Romberg is negative    ASSESSMENT AND PLAN  1. Myofascial pain syndrome   2. Cognitive complaints   3. Mid back pain   4. Smell disturbance   5. Muscle spasm   6. Aphasia   7. Post-COVID syndrome   8. Lightheadedness   9. Word finding difficulty      1.  Inject 100 units of Botox using 30-gauge needle split into tender points below the T6-T9 ribs into the intercostal muscles near the midclavicular line.  There were no complications and she tolerated the injections well.   2.   Neurocognitive testing was normal.  I do not think that she has a neurodegenerative process.  She actually feels her speech is doing better than earlier in the year.   3.  She should continue to stay active and exercise as tolerated.  Can take rare Percocet as needed for days when there is more intense pain.  If severe spasms take Valium  4.   She will return in 3-4 months for next Botox or sooner if  new or worsening neurologic symptoms.  Will Heinkel A. Epimenio Foot, MD,  PhD 07/01/2023, 2:57 PM Certified in Neurology, Clinical Neurophysiology, Sleep Medicine, Pain Medicine and Neuroimaging  [facility] [address] [place], [place] [address] [phone]

## 2023-07-01 NOTE — Progress Notes (Signed)
Botox- 100 units x 1 vial Lot: [id number] Expiration: 11.2026 NDC: 0023-3921-02  Bacteriostatic 0.9% Sodium Chloride- * mL  Lot: [id number] Expiration: 04.01.2025 NDC: 0865784696  Dx: G35.  B/B Witnessed by Mack Hook, CMA

## 2023-07-08 ENCOUNTER — Ambulatory Visit (HOSPITAL_COMMUNITY)
Admission: RE | Admit: 2023-07-08 | Discharge: 2023-07-08 | Disposition: A | Discharge status: Home / Self Care | Payer: Medicare Other | Source: Ambulatory Visit | Attending: Internal Medicine | Admitting: Internal Medicine

## 2023-07-08 DIAGNOSIS — Z1231 Encounter for screening mammogram for malignant neoplasm of breast: Secondary | ICD-10-CM | POA: Insufficient documentation

## 2023-07-10 ENCOUNTER — Other Ambulatory Visit (HOSPITAL_COMMUNITY): Payer: Self-pay | Admitting: Internal Medicine

## 2023-07-10 DIAGNOSIS — R928 Other abnormal and inconclusive findings on diagnostic imaging of breast: Secondary | ICD-10-CM

## 2023-07-16 ENCOUNTER — Encounter (HOSPITAL_COMMUNITY): Payer: Self-pay

## 2023-07-16 ENCOUNTER — Ambulatory Visit (HOSPITAL_COMMUNITY)
Admission: RE | Admit: 2023-07-16 | Discharge: 2023-07-16 | Disposition: A | Discharge status: Home / Self Care | Payer: Medicare Other | Source: Ambulatory Visit | Attending: Internal Medicine | Admitting: Internal Medicine

## 2023-07-16 DIAGNOSIS — R928 Other abnormal and inconclusive findings on diagnostic imaging of breast: Secondary | ICD-10-CM | POA: Diagnosis not present

## 2023-07-16 DIAGNOSIS — R92321 Mammographic fibroglandular density, right breast: Secondary | ICD-10-CM | POA: Diagnosis not present

## 2023-07-16 DIAGNOSIS — R921 Mammographic calcification found on diagnostic imaging of breast: Secondary | ICD-10-CM | POA: Diagnosis not present

## 2023-07-17 ENCOUNTER — Other Ambulatory Visit (HOSPITAL_COMMUNITY): Payer: Self-pay | Admitting: Internal Medicine

## 2023-07-17 DIAGNOSIS — R928 Other abnormal and inconclusive findings on diagnostic imaging of breast: Secondary | ICD-10-CM

## 2023-07-17 DIAGNOSIS — R921 Mammographic calcification found on diagnostic imaging of breast: Secondary | ICD-10-CM

## 2023-07-18 ENCOUNTER — Telehealth: Payer: Self-pay | Admitting: Internal Medicine

## 2023-07-18 ENCOUNTER — Other Ambulatory Visit: Payer: Self-pay

## 2023-07-18 ENCOUNTER — Other Ambulatory Visit: Payer: Self-pay | Admitting: Internal Medicine

## 2023-07-18 ENCOUNTER — Encounter: Payer: Self-pay | Admitting: Internal Medicine

## 2023-07-18 DIAGNOSIS — W57XXXA Bitten or stung by nonvenomous insect and other nonvenomous arthropods, initial encounter: Secondary | ICD-10-CM

## 2023-07-18 MED ORDER — DOXYCYCLINE HYCLATE 100 MG PO TABS
100.0000 mg | ORAL_TABLET | Freq: Two times a day (BID) | ORAL | 0 refills | Status: DC
Start: 2023-07-18 — End: 2023-10-15

## 2023-07-18 MED ORDER — DOXYCYCLINE HYCLATE 100 MG PO TABS
100.0000 mg | ORAL_TABLET | Freq: Two times a day (BID) | ORAL | 0 refills | Status: DC
Start: 2023-07-18 — End: 2023-07-18

## 2023-07-18 NOTE — Telephone Encounter (Signed)
Patient spouse called 2 x's asking a call back about the Doxoycline for her tick bite, needs more details information on the prescription.

## 2023-07-18 NOTE — Telephone Encounter (Signed)
Spoke to spouse

## 2023-07-29 ENCOUNTER — Ambulatory Visit
Admission: RE | Admit: 2023-07-29 | Discharge: 2023-07-29 | Disposition: A | Discharge status: Home / Self Care | Payer: Medicare Other | Source: Ambulatory Visit | Attending: Internal Medicine | Admitting: Internal Medicine

## 2023-07-29 DIAGNOSIS — R921 Mammographic calcification found on diagnostic imaging of breast: Secondary | ICD-10-CM

## 2023-07-29 DIAGNOSIS — N6489 Other specified disorders of breast: Secondary | ICD-10-CM | POA: Diagnosis not present

## 2023-07-29 DIAGNOSIS — R928 Other abnormal and inconclusive findings on diagnostic imaging of breast: Secondary | ICD-10-CM

## 2023-07-29 HISTORY — PX: BREAST BIOPSY: SHX20

## 2023-07-30 ENCOUNTER — Other Ambulatory Visit: Payer: Self-pay | Admitting: Neurology

## 2023-07-31 MED ORDER — OXYCODONE-ACETAMINOPHEN 5-325 MG PO TABS: ORAL_TABLET | ORAL | 0 refills | Status: DC

## 2023-07-31 NOTE — Telephone Encounter (Signed)
Dr.Yan you are work in provider this morning Last seen on 07/01/23 Follow up scheduled on 09/23/23 Last filled on 06/24/23 #30 tablets  Rx pending to be signed

## 2023-08-17 ENCOUNTER — Ambulatory Visit
Admission: EM | Admit: 2023-08-17 | Discharge: 2023-08-17 | Disposition: A | Discharge status: Home / Self Care | Payer: Medicare Other | Attending: Family Medicine | Admitting: Family Medicine

## 2023-08-17 ENCOUNTER — Ambulatory Visit: Payer: Medicare Other

## 2023-08-17 DIAGNOSIS — S92512A Displaced fracture of proximal phalanx of left lesser toe(s), initial encounter for closed fracture: Secondary | ICD-10-CM | POA: Diagnosis not present

## 2023-08-17 DIAGNOSIS — S92515A Nondisplaced fracture of proximal phalanx of left lesser toe(s), initial encounter for closed fracture: Secondary | ICD-10-CM

## 2023-08-17 MED ORDER — TRAMADOL HCL 50 MG PO TABS
50.0000 mg | ORAL_TABLET | Freq: Two times a day (BID) | ORAL | 0 refills | Status: DC | PRN
Start: 2023-08-17 — End: 2023-08-20

## 2023-08-17 NOTE — ED Triage Notes (Signed)
Pt reports she went down dark stairs in her home and missed the last step about 20 minutes ago.   Denies hitting her head. Has left little toe pain and swelling  and top of foot pain. States her left little toe went side ways. Pain increases with walking.

## 2023-08-17 NOTE — ED Notes (Addendum)
Pt originally fitted for medium but reported would like to try small. Pt stated small "feels better". Pt left great big toe noted to be at edge of small. Educated pt on potential risk of this size. Pt elected for small over medium. Updated don-joy, unable to modify original EPIC order to reflect small being placed rather than medium that was recommended.

## 2023-08-17 NOTE — Discharge Instructions (Addendum)
Wear the postop shoe when walking, elevate at rest, ice as needed.  I have sent over a small supply of tramadol to help with pain as needed.  Ibuprofen and Tylenol additionally.  Follow-up with the podiatrist for a recheck and further instructions

## 2023-08-19 ENCOUNTER — Telehealth: Payer: Self-pay

## 2023-08-19 NOTE — Telephone Encounter (Signed)
Patient called. She has fractured her left fifth toe. She was seen at Urgent Care and they put her in a post op shoe. Does she need to follow up with you and when. [phone]

## 2023-08-20 ENCOUNTER — Ambulatory Visit (HOSPITAL_BASED_OUTPATIENT_CLINIC_OR_DEPARTMENT_OTHER): Payer: Medicare Other | Admitting: Student

## 2023-08-20 ENCOUNTER — Encounter (HOSPITAL_BASED_OUTPATIENT_CLINIC_OR_DEPARTMENT_OTHER): Payer: Self-pay | Admitting: Student

## 2023-08-20 ENCOUNTER — Telehealth: Payer: Medicare Other | Admitting: Physician Assistant

## 2023-08-20 DIAGNOSIS — S92512A Displaced fracture of proximal phalanx of left lesser toe(s), initial encounter for closed fracture: Secondary | ICD-10-CM | POA: Diagnosis not present

## 2023-08-20 DIAGNOSIS — Z23 Encounter for immunization: Secondary | ICD-10-CM | POA: Diagnosis not present

## 2023-08-20 DIAGNOSIS — R200 Anesthesia of skin: Secondary | ICD-10-CM

## 2023-08-20 MED ORDER — TRAMADOL HCL 50 MG PO TABS: 50.0000 mg | ORAL_TABLET | Freq: Two times a day (BID) | ORAL | 0 refills | Status: DC | PRN

## 2023-08-20 NOTE — Progress Notes (Signed)
Chief Complaint: Left pinky toe fracture     History of Present Illness:    Heather [name] is a 77 y.o. female presents today for follow-up evaluation of a left fifth toe fracture that occurred on 08/17/2023.  Patient states that she was going down the stairs in her house and missed a few of the last steps.  She was seen in urgent care and was placed in a postop shoe after x-rays revealed a proximal phalanx fracture of the left fifth toe.  She continues to have moderate to severe pain in this toe as well as numbness noted in the bottom of the foot.  She is taking tramadol 50 mg twice daily which has significantly been helping her pain levels.  She works at a [employer].   Surgical History:   None  PMH/PSH/Family History/Social History/Meds/Allergies:    Past Medical History:  Diagnosis Date   Adrenal adenoma    Allergy    Anemia    Anxiety    CAD (coronary artery disease)    a. 50-60% mid LAD at cardiac catheterization 2014 [facility]) b. non-obstructive CAD by cath in 09/2018   Cataract    Depression    DJD (degenerative joint disease) of cervical spine 09/12/2016   Dysrhythmia    H. pylori infection 07/19/2017   History of cardiomyopathy    History of COVID-19 12/10/2019   History of tobacco use    Hyperlipidemia    Hypothyroidism 08/24/2022   Mid back pain 02/08/2017   Neuromuscular disorder (HCC)    PAF (paroxysmal atrial fibrillation) (HCC)    Diagnosis July 2017 - spontaneously resolved   Smell disturbance 06/13/2021   Statin intolerance    Past Surgical History:  Procedure Laterality Date   ABDOMINAL HYSTERECTOMY  1980   endometriosis   BIOPSY  07/18/2021   Procedure: BIOPSY;  Surgeon: Dolores Frame, MD;  [facility];  Service: Gastroenterology;;   BREAST BIOPSY Right 07/29/2023   MM RT BREAST BX W LOC DEV 1ST LESION IMAGE BX SPEC STEREO GUIDE 07/29/2023 [facility]   CATARACT EXTRACTION Left  11/2018   COLONOSCOPY WITH PROPOFOL N/A 06/27/2022   Procedure: COLONOSCOPY WITH PROPOFOL;  Surgeon: Dolores Frame, MD;  [facility];  Service: Gastroenterology;  Laterality: N/A;  11:15   CORONARY PRESSURE/FFR STUDY N/A 06/01/2021   Procedure: INTRAVASCULAR PRESSURE WIRE/FFR STUDY;  Surgeon: Lyn Records, MD;  [facility]: [facility];  Service: Cardiovascular;  Laterality: N/A;   ESOPHAGOGASTRODUODENOSCOPY (EGD) WITH PROPOFOL N/A 07/18/2021   Procedure: ESOPHAGOGASTRODUODENOSCOPY (EGD) WITH PROPOFOL;  Surgeon: Dolores Frame, MD;  [facility];  Service: Gastroenterology;  Laterality: N/A;  12:00   EYE SURGERY  cataracts  2021/2022   HEMOSTASIS CLIP PLACEMENT  06/27/2022   Procedure: HEMOSTASIS CLIP PLACEMENT;  Surgeon: Dolores Frame, MD;  [facility];  Service: Gastroenterology;;   LEFT HEART CATH AND CORONARY ANGIOGRAPHY N/A 09/25/2018   Procedure: LEFT HEART CATH AND CORONARY ANGIOGRAPHY;  Surgeon: Lyn Records, MD;  [facility];  Service: Cardiovascular;  Laterality: N/A;   LEFT HEART CATH AND CORONARY ANGIOGRAPHY N/A 06/01/2021   Procedure: LEFT HEART CATH AND CORONARY ANGIOGRAPHY;  Surgeon: Lyn Records, MD;  [facility];  Service: Cardiovascular;  Laterality: N/A;  POLYPECTOMY  06/27/2022   Procedure: POLYPECTOMY;  Surgeon: Dolores Frame, MD;  [facility];  Service: Gastroenterology;;   Gaspar Bidding DILATION  07/18/2021   Procedure: Gaspar Bidding DILATION;  Surgeon: Marguerita Merles, Reuel Boom, MD;  [facility];  Service: Gastroenterology;;   Brooke Dare INJECTION  06/27/2022   Procedure: SUBMUCOSAL LIFTING INJECTION;  Surgeon: Dolores Frame, MD;  [facility];  Service: Gastroenterology;;   SUBMUCOSAL TATTOO INJECTION  06/27/2022   Procedure: SUBMUCOSAL TATTOO INJECTION;  Surgeon: Dolores Frame, MD;  [facility]  [facility];  Service: Gastroenterology;;   TONSILLECTOMY  1954   Social History   Socioeconomic History   Marital status: Married    Spouse name: Al   Number of children: 0   Years of education: 18   Highest education level: Master's degree (e.g., MA, MS, MEng, MEd, MSW, MBA)  Occupational History   Occupation: Clinical research associate    Comment: from home  Tobacco Use   Smoking status: Former    Current packs/day: 0.00    Average packs/day: 1 pack/day for 42.8 years (42.8 ttl pk-yrs)    Types: Cigarettes    Start date: 11/12/1965    Quit date: 09/06/2008    Years since quitting: 14.9   Smokeless tobacco: Never  Vaping Use   Vaping status: Never Used  Substance and Sexual Activity   Alcohol use: Yes    Alcohol/week: 2.0 standard drinks of alcohol    Types: 1 Glasses of wine, 1 Shots of liquor per week    Comment: occasional   Drug use: Yes    Types: Hydrocodone, Marijuana    Comment: prescribed Percocet for myofacial pain syndrome   Sexual activity: Yes    Birth control/protection: Post-menopausal  Other Topics Concern   Not on file  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   Lives with husband AL   No children   Healthy diet and lifestyle   Social Determinants of Health   Financial Resource Strain: Low Risk  (02/09/2023)   Overall Financial Resource Strain (CARDIA)    Difficulty of Paying Living Expenses: Not very hard  Food Insecurity: No Food Insecurity (02/09/2023)   Hunger Vital Sign    Worried About Running Out of Food in the Last Year: Never true    Ran Out of Food in the Last Year: Never true  Transportation Needs: No Transportation Needs (02/09/2023)   PRAPARE - Administrator, Civil Service (Medical): No    Lack of Transportation (Non-Medical): No  Physical Activity: Insufficiently Active (02/09/2023)   Exercise Vital Sign    Days of Exercise per Week: 3 days    Minutes of Exercise per Session: 30 min  Stress: Stress Concern Present (02/09/2023)    [employer] of Occupational Health - Occupational Stress Questionnaire    Feeling of Stress : To some extent  Social Connections: Moderately Integrated (02/09/2023)   Social Connection and Isolation Panel [NHANES]    Frequency of Communication with Friends and Family: More than three times a week    Frequency of Social Gatherings with Friends and Family: Twice a week    Attends Religious Services: Never    Database administrator or Organizations: Yes    Attends Engineer, structural: 1 to 4 times per year    Marital Status: Married   Family History  Problem Relation Age of Onset   COPD Mother    Hearing loss Mother    Stroke Mother  Heart disease Mother        chf, died at 55   Vision loss Mother    Varicose Veins Mother    Arthritis Father    Hypertension Father    Heart disease Father        cad, pvd, died at 79   Vision loss Father    Heart disease Sister        heart valve   Arthritis Sister    Depression Sister    Hyperlipidemia Brother    Hypertension Brother    Cancer - Prostate Brother    Allergies  Allergen Reactions   Statins Other (See Comments)    Muscle weakness   Other     EKG pads causes blisters    Sulfa Antibiotics Rash   Current Outpatient Medications  Medication Sig Dispense Refill   buPROPion (WELLBUTRIN SR) 150 MG 12 hr tablet TAKE ONE TABLET BY MOUTH TWICE A DAY 180 tablet 1   Cholecalciferol (VITAMIN D3) 125 MCG (5000 UT) TABS Take 5,000 Units by mouth daily at 12 noon.     Coenzyme Q10 100 MG capsule Take 100 mg by mouth daily at 12 noon.     diazepam (VALIUM) 5 MG tablet Take 1 tablet (5 mg total) by mouth daily as needed for anxiety or muscle spasms. 30 tablet 0   docusate sodium (COLACE) 100 MG capsule Take 100 mg by mouth 2 (two) times daily.      doxycycline (VIBRA-TABS) 100 MG tablet Take 1 tablet (100 mg total) by mouth 2 (two) times daily. 14 tablet 0   estradiol (ESTRACE) 1 MG tablet Take 1 mg by mouth daily.      inclisiran (LEQVIO) 284 MG/1.5ML SOSY injection Inject 284 mg into the skin once.     mupirocin ointment (BACTROBAN) 2 % Apply 1 Application topically 2 (two) times daily. 30 g 0   nitroGLYCERIN (NITROSTAT) 0.4 MG SL tablet Place 1 tablet under the tongue every 5 minutes as needed for chest pain. 25 tablet 1   oxyCODONE-acetaminophen (PERCOCET/ROXICET) 5-325 MG tablet TAKE 1 TABLET DAILY AS NEEDED FOR SEVERE PAIN. 30 tablet 0   progesterone (PROMETRIUM) 200 MG capsule Take 1 capsule (200 mg total) by mouth daily. 90 capsule 1   thyroid (NP THYROID) 60 MG tablet Take 1 tablet (60 mg total) by mouth daily before breakfast. 90 tablet 0   traMADol (ULTRAM) 50 MG tablet Take 1 tablet (50 mg total) by mouth every 12 (twelve) hours as needed. 10 tablet 0   warfarin (COUMADIN) 5 MG tablet Take 1 tablet to 1 1/2 tablets daily as directed by coumadin clinic 135 tablet 1   No current facility-administered medications for this visit.   No results found.  Review of Systems:   A ROS was performed including pertinent positives and negatives as documented in the HPI.  Physical Exam :   Constitutional: NAD and appears stated age Neurological: Alert and oriented Psych: Appropriate affect and cooperative Last menstrual period 07/08/1979.   Comprehensive Musculoskeletal Exam:    Ecchymosis noted throughout the left fifth toe extending slightly into the forefoot.  Minimal visible angulation.  Flexor and extensor mechanisms are intact.  Tenderness distally from the fifth MTP joint, but no other tenderness noted throughout the foot.  DP pulse 2+.  Decrease in sensation noted over the plantar surface of the foot, from the MTP joints distally to all 5 toes.  Imaging:   Xray review from 08/17/2023 (left foot 3 views): Comminuted intra-articular  fractures of the base of the fifth proximal phalanx.  There is some displacement with mild valgus angulation.    I personally reviewed and interpreted the  radiographs.   Assessment:   76 y.o. female with a comminuted intra-articular fracture of the fifth proximal phalanx base of the left foot.  Given that angulation is mild and there is no significant rotational deformity, I believe she can continue to progress with conservative management.  She appears to be tolerating the postop shoe well however we did some adjustments in clinic to ensure proper fit.  I have encouraged her to utilize buddy taping to the fourth toe to help keep the fracture aligned.  She was able to take some steps in the clinic while taped in the postop shoe and tolerated well.  I will send her in a few more days of tramadol, but after encouraged slowly weaning off of this to Tylenol and ibuprofen.  I would like to see her back in 4 weeks for repeat x-ray and reassessment.  Plan :    -Continue use of the postop shoe -Return to clinic in 4 weeks for follow-up evaluation     I personally saw and evaluated the patient, and participated in the management and treatment plan.  Hazle Nordmann, PA-C Orthopedics

## 2023-08-20 NOTE — Progress Notes (Signed)
Virtual Visit Consent   Heather [name], you are scheduled for a virtual visit with a [facility] provider today. Just as with appointments in the office, your consent must be obtained to participate. Your consent will be active for this visit and any virtual visit you may have with one of our providers in the next 365 days. If you have a MyChart account, a copy of this consent can be sent to you electronically.  As this is a virtual visit, video technology does not allow for your provider to perform a traditional examination. This may limit your provider's ability to fully assess your condition. If your provider identifies any concerns that need to be evaluated in person or the need to arrange testing (such as labs, EKG, etc.), we will make arrangements to do so. Although advances in technology are sophisticated, we cannot ensure that it will always work on either your end or our end. If the connection with a video visit is poor, the visit may have to be switched to a telephone visit. With either a video or telephone visit, we are not always able to ensure that we have a secure connection.  By engaging in this virtual visit, you consent to the provision of healthcare and authorize for your insurance to be billed (if applicable) for the services provided during this visit. Depending on your insurance coverage, you may receive a charge related to this service.  I need to obtain your verbal consent now. Are you willing to proceed with your visit today? Francoise Chojnowski has provided verbal consent on 08/20/2023 for a virtual visit (video or telephone). Heather [name], [place]  Date: 08/20/2023 10:46 AM  Virtual Visit via Video Note   I, Heather [name], connected with  Nevena Rozenberg  ([id number], Jan 31, 1946) on 08/20/23 at 11:00 AM EDT by a video-enabled telemedicine application and verified that I am speaking with the correct person using two identifiers.  Location: Patient: Virtual Visit Location  Patient: Home Provider: Virtual Visit Location Provider: Home Office   I discussed the limitations of evaluation and management by telemedicine and the availability of in person appointments. The patient expressed understanding and agreed to proceed.    History of Present Illness: Heather [name] is a 77 y.o. who identifies as a female who was assigned female at birth, and is being seen today for numbness of entire plantar surface of L foot over the past day or so after sustaining a fracture of 5th phalanx on 10/5. Notes swelling and bruising across front fore foot has resolved. Still with residual swelling of 5th phalanx. Denies any redness or swelling of plantar surface of foot. Denies bruising. Denies decreased ROM. Denies warmth or coolness of the foot or phalanges. Contacted her Podiatrist but unable to be seen until the 16th.   HPI: HPI  Problems:  Patient Active Problem List   Diagnosis Date Noted   Cognitive complaints 07/01/2023   COVID-19 06/06/2023   Cerebral atrophy (HCC) 02/13/2023   Foot and ankle pain 09/26/2022   Hypothyroidism 08/24/2022   Cyst of buttocks 05/04/2022   DOE (dyspnea on exertion) 10/09/2021   Post-COVID syndrome 07/31/2021   Dysphagia 07/10/2021   Tubular adenoma of colon 07/19/2017   Mid back pain 02/08/2017   Hx of colonic polyps 12/03/2016   Family history of colon cancer 12/03/2016   Encounter for therapeutic drug monitoring 11/21/2016   CAD (coronary artery disease) 09/12/2016   PAF (paroxysmal atrial fibrillation) (HCC) 09/12/2016   DJD (degenerative joint disease) of  cervical spine 09/12/2016   Statin intolerance 09/12/2016   TIA (transient ischemic attack) 09/12/2016   Diverticulitis of colon 09/12/2016   Myofascial pain syndrome 09/06/2016   Takotsubo syndrome 09/06/2016   HLD (hyperlipidemia) 09/06/2016   GAD (generalized anxiety disorder) 09/06/2016   Recurrent UTI (urinary tract infection) 09/06/2016    Allergies:  Allergies  Allergen  Reactions   Statins Other (See Comments)    Muscle weakness   Other     EKG pads causes blisters    Sulfa Antibiotics Rash   Medications:  Current Outpatient Medications:    buPROPion (WELLBUTRIN SR) 150 MG 12 hr tablet, TAKE ONE TABLET BY MOUTH TWICE A DAY, Disp: 180 tablet, Rfl: 1   Cholecalciferol (VITAMIN D3) 125 MCG (5000 UT) TABS, Take 5,000 Units by mouth daily at 12 noon., Disp: , Rfl:    Coenzyme Q10 100 MG capsule, Take 100 mg by mouth daily at 12 noon., Disp: , Rfl:    diazepam (VALIUM) 5 MG tablet, Take 1 tablet (5 mg total) by mouth daily as needed for anxiety or muscle spasms., Disp: 30 tablet, Rfl: 0   docusate sodium (COLACE) 100 MG capsule, Take 100 mg by mouth 2 (two) times daily. , Disp: , Rfl:    doxycycline (VIBRA-TABS) 100 MG tablet, Take 1 tablet (100 mg total) by mouth 2 (two) times daily., Disp: 14 tablet, Rfl: 0   estradiol (ESTRACE) 1 MG tablet, Take 1 mg by mouth daily., Disp: , Rfl:    inclisiran (LEQVIO) 284 MG/1.5ML SOSY injection, Inject 284 mg into the skin once., Disp: , Rfl:    mupirocin ointment (BACTROBAN) 2 %, Apply 1 Application topically 2 (two) times daily., Disp: 30 g, Rfl: 0   nitroGLYCERIN (NITROSTAT) 0.4 MG SL tablet, Place 1 tablet under the tongue every 5 minutes as needed for chest pain., Disp: 25 tablet, Rfl: 1   oxyCODONE-acetaminophen (PERCOCET/ROXICET) 5-325 MG tablet, TAKE 1 TABLET DAILY AS NEEDED FOR SEVERE PAIN., Disp: 30 tablet, Rfl: 0   progesterone (PROMETRIUM) 200 MG capsule, Take 1 capsule (200 mg total) by mouth daily., Disp: 90 capsule, Rfl: 1   thyroid (NP THYROID) 60 MG tablet, Take 1 tablet (60 mg total) by mouth daily before breakfast., Disp: 90 tablet, Rfl: 0   traMADol (ULTRAM) 50 MG tablet, Take 1 tablet (50 mg total) by mouth every 12 (twelve) hours as needed., Disp: 10 tablet, Rfl: 0   warfarin (COUMADIN) 5 MG tablet, Take 1 tablet to 1 1/2 tablets daily as directed by coumadin clinic, Disp: 135 tablet, Rfl:  1  Observations/Objective: Patient is well-developed, well-nourished in no acute distress.  Resting comfortably  at home.  Head is normocephalic, atraumatic.  No labored breathing.  Speech is clear and coherent with logical content.  Patient is alert and oriented at baseline.  5th phalnax with noted bruising and swelling. No residual swelling of anterior forefoot noted. Normal ROM of other phalanges. Normal ROM of ankle without noted bruising or swelling.   Assessment and Plan: 1. Numbness of foot  Numbness of entirety of L foot -- plantar surface without new swelling or further known injury. Giving distrubution of numbness and that she has not bee wearing compression stocking/wrap that could be a culprit, in person evaluation for detailed examination recommended. Resources given so she can be evaluated today.  Follow Up Instructions: I discussed the assessment and treatment plan with the patient. The patient was provided an opportunity to ask questions and all were answered. The patient agreed with  the plan and demonstrated an understanding of the instructions.  A copy of instructions were sent to the patient via MyChart unless otherwise noted below.   The patient was advised to call back or seek an in-person evaluation if the symptoms worsen or if the condition fails to improve as anticipated.  Time:  I spent 10 minutes with the patient via telehealth technology discussing the above problems/concerns.    Heather Climes, PA-C

## 2023-08-20 NOTE — Patient Instructions (Signed)
Heather [name], thank you for joining [name], PA-C for today's virtual visit.  While this provider is not your primary care provider (PCP), if your PCP is located in our provider database this encounter information will be shared with them immediately following your visit.   A [facility] MyChart account gives you access to today's visit and all your visits, tests, and labs performed at [name] " click here if you don't have a [name] account or go to [email or web address]  Consent: (Patient) Heather [name] provided verbal consent for this virtual visit at the beginning of the encounter.  Current Medications:  Current Outpatient Medications:    buPROPion (WELLBUTRIN SR) 150 MG 12 hr tablet, TAKE ONE TABLET BY MOUTH TWICE A DAY, Disp: 180 tablet, Rfl: 1   Cholecalciferol (VITAMIN D3) 125 MCG (5000 UT) TABS, Take 5,000 Units by mouth daily at 12 noon., Disp: , Rfl:    Coenzyme Q10 100 MG capsule, Take 100 mg by mouth daily at 12 noon., Disp: , Rfl:    diazepam (VALIUM) 5 MG tablet, Take 1 tablet (5 mg total) by mouth daily as needed for anxiety or muscle spasms., Disp: 30 tablet, Rfl: 0   docusate sodium (COLACE) 100 MG capsule, Take 100 mg by mouth 2 (two) times daily. , Disp: , Rfl:    doxycycline (VIBRA-TABS) 100 MG tablet, Take 1 tablet (100 mg total) by mouth 2 (two) times daily., Disp: 14 tablet, Rfl: 0   estradiol (ESTRACE) 1 MG tablet, Take 1 mg by mouth daily., Disp: , Rfl:    inclisiran (LEQVIO) 284 MG/1.5ML SOSY injection, Inject 284 mg into the skin once., Disp: , Rfl:    mupirocin ointment (BACTROBAN) 2 %, Apply 1 Application topically 2 (two) times daily., Disp: 30 g, Rfl: 0   nitroGLYCERIN (NITROSTAT) 0.4 MG SL tablet, Place 1 tablet under the tongue every 5 minutes as needed for chest pain., Disp: 25 tablet, Rfl: 1   oxyCODONE-acetaminophen (PERCOCET/ROXICET) 5-325 MG tablet, TAKE 1 TABLET DAILY AS NEEDED FOR SEVERE PAIN., Disp: 30 tablet,  Rfl: 0   progesterone (PROMETRIUM) 200 MG capsule, Take 1 capsule (200 mg total) by mouth daily., Disp: 90 capsule, Rfl: 1   thyroid (NP THYROID) 60 MG tablet, Take 1 tablet (60 mg total) by mouth daily before breakfast., Disp: 90 tablet, Rfl: 0   traMADol (ULTRAM) 50 MG tablet, Take 1 tablet (50 mg total) by mouth every 12 (twelve) hours as needed., Disp: 10 tablet, Rfl: 0   warfarin (COUMADIN) 5 MG tablet, Take 1 tablet to 1 1/2 tablets daily as directed by coumadin clinic, Disp: 135 tablet, Rfl: 1   Medications ordered in this encounter:  No orders of the defined types were placed in this encounter.    *If you need refills on other medications prior to your next appointment, please contact your pharmacy*  Follow-Up: Call back or seek an in-person evaluation if the symptoms worsen or if the condition fails to improve as anticipated.  [name] [phone]  Other Instructions [name] -- Same-Day [facility]   Monday - Friday   11AM-7PM   Schedule via website (see below) or as walk-in   [address], [address]   [place], [place] [address]   [phone]   [facility]  [place], [place]  [place]      [facility]   [address],  [place], [place] [address]   GET DIRECTIONS  CONTACT   Phone: [phone]   Phone: [phone]   URGENT CARE HOURS   Monday - Friday:   8:00am to 8:00pm   Saturday:   10:00am to 3:00pm         [facility]   [address]  [place], [place] [address]   GET DIRECTIONS   CONTACT   Phone: [phone]   URGENT CARE HOURS   Monday:   9:00AM - 9:00PM   Tuesday:   9:00AM - 9:00PM   Wednesday:   9:00AM - 9:00PM   Thursday:   9:00AM - 9:00PM   Friday:   9:00AM - 9:00PM   Saturday:   9:00AM - 9:00PM   Sunday:   9:00AM - 9:00PM   HOLIDAY HOURS   Holidays:   8:30AM - 4:30PM      [place]   After Hours [facility] [phone]      [facility] [address] [place], [place] [address]      EVENINGS & WEEKENDS NO APPOINTMENT NECESSARY Mon-Fri 5:30PM - 9PM    Sat 9 AM - 2 PM Sun 10 AM - 2 PM      [facility]   [address]  [address]  [place], [place] [address]   [phone]      OFFICE HOURS:   MONDAY - FRIDAY : 8:00 A.M. - 4:00 P.M.      [facility]  [address]  [place], [place] [address]   [phone]      OFFICE HOURS:   MONDAY - FRIDAY: 8:00 A.M. - 8:30 P.M.   SATURDAY: 10:00 A.M. - 2:00 P.M.          If you have been instructed to have an in-person evaluation today at a local Urgent Care facility, please use the link below. It will take you to a list of all of our available [facility], including address, phone number and hours of operation. Please do not delay care.  [facility]  If you or a family member do not have a primary care provider, use the link below to schedule a visit and establish care. When you choose a [facility] primary care physician or advanced practice provider, you gain a long-term partner in health. Find a Primary Care Provider  Learn more about [facility] in-office and virtual care options: [facility] - Get Care Now

## 2023-08-20 NOTE — ED Provider Notes (Addendum)
[facility]    CSN: [id number] Arrival date & time: 08/17/23  1216      History   Chief Complaint No chief complaint on file.   HPI Heather [name] is a 77 y.o. female.   Presenting today with severe left foot pain mainly in pinky toe after missing a step and falling in such a way that the toe was bent sideways. Denies numbness, tingling, loss of ROM, bruising. Did not hit head or lose consciousness. So far trying OTC pain relievers with minimal relief.    Past Medical History:  Diagnosis Date   Adrenal adenoma    Allergy    Anemia    Anxiety    CAD (coronary artery disease)    a. 50-60% mid LAD at cardiac catheterization 2014 [facility]) b. non-obstructive CAD by cath in 09/2018   Cataract    Depression    DJD (degenerative joint disease) of cervical spine 09/12/2016   Dysrhythmia    H. pylori infection 07/19/2017   History of cardiomyopathy    History of COVID-19 12/10/2019   History of tobacco use    Hyperlipidemia    Hypothyroidism 08/24/2022   Mid back pain 02/08/2017   Neuromuscular disorder (HCC)    PAF (paroxysmal atrial fibrillation) (HCC)    Diagnosis July 2017 - spontaneously resolved   Smell disturbance 06/13/2021   Statin intolerance     Patient Active Problem List   Diagnosis Date Noted   Cognitive complaints 07/01/2023   COVID-19 06/06/2023   Cerebral atrophy (HCC) 02/13/2023   Foot and ankle pain 09/26/2022   Hypothyroidism 08/24/2022   Cyst of buttocks 05/04/2022   DOE (dyspnea on exertion) 10/09/2021   Post-COVID syndrome 07/31/2021   Dysphagia 07/10/2021   Tubular adenoma of colon 07/19/2017   Mid back pain 02/08/2017   Hx of colonic polyps 12/03/2016   Family history of colon cancer 12/03/2016   Encounter for therapeutic drug monitoring 11/21/2016   CAD (coronary artery disease) 09/12/2016   PAF (paroxysmal atrial fibrillation) (HCC) 09/12/2016   DJD (degenerative joint disease) of cervical spine 09/12/2016   Statin  intolerance 09/12/2016   TIA (transient ischemic attack) 09/12/2016   Diverticulitis of colon 09/12/2016   Myofascial pain syndrome 09/06/2016   Takotsubo syndrome 09/06/2016   HLD (hyperlipidemia) 09/06/2016   GAD (generalized anxiety disorder) 09/06/2016   Recurrent UTI (urinary tract infection) 09/06/2016    Past Surgical History:  Procedure Laterality Date   ABDOMINAL HYSTERECTOMY  1980   endometriosis   BIOPSY  07/18/2021   Procedure: BIOPSY;  Surgeon: Dolores Frame, MD;  Location: [facility];  Service: Gastroenterology;;   BREAST BIOPSY Right 07/29/2023   MM RT BREAST BX W LOC DEV 1ST LESION IMAGE BX SPEC STEREO GUIDE 07/29/2023 [facility]   CATARACT EXTRACTION Left 11/2018   COLONOSCOPY WITH PROPOFOL N/A 06/27/2022   Procedure: COLONOSCOPY WITH PROPOFOL;  Surgeon: Dolores Frame, MD;  Location: [facility];  Service: Gastroenterology;  Laterality: N/A;  11:15   CORONARY PRESSURE/FFR STUDY N/A 06/01/2021   Procedure: INTRAVASCULAR PRESSURE WIRE/FFR STUDY;  Surgeon: Lyn Records, MD;  Location: [facility];  Service: Cardiovascular;  Laterality: N/A;   ESOPHAGOGASTRODUODENOSCOPY (EGD) WITH PROPOFOL N/A 07/18/2021   Procedure: ESOPHAGOGASTRODUODENOSCOPY (EGD) WITH PROPOFOL;  Surgeon: Dolores Frame, MD;  Location: [facility];  Service: Gastroenterology;  Laterality: N/A;  12:00   EYE SURGERY  cataracts  2021/2022   HEMOSTASIS CLIP PLACEMENT  06/27/2022   Procedure: HEMOSTASIS CLIP PLACEMENT;  Surgeon: Marguerita Merles, Reuel Boom, MD;  Location: [facility];  Service: Gastroenterology;;   LEFT HEART CATH AND CORONARY ANGIOGRAPHY N/A 09/25/2018   Procedure: LEFT HEART CATH AND CORONARY ANGIOGRAPHY;  Surgeon: Lyn Records, MD;  Location: [facility];  Service: Cardiovascular;  Laterality: N/A;   LEFT HEART CATH AND CORONARY ANGIOGRAPHY N/A 06/01/2021   Procedure: LEFT HEART CATH AND CORONARY ANGIOGRAPHY;  Surgeon:  Lyn Records, MD;  Location: [facility];  Service: Cardiovascular;  Laterality: N/A;   POLYPECTOMY  06/27/2022   Procedure: POLYPECTOMY;  Surgeon: Dolores Frame, MD;  [facility];  Service: Gastroenterology;;   Gaspar Bidding DILATION  07/18/2021   Procedure: Gaspar Bidding DILATION;  Surgeon: Marguerita Merles, Reuel Boom, MD;  Location: [facility];  Service: Gastroenterology;;   SUBMUCOSAL LIFTING INJECTION  06/27/2022   Procedure: SUBMUCOSAL LIFTING INJECTION;  Surgeon: Dolores Frame, MD;  [facility]: [facility];  Service: Gastroenterology;;   SUBMUCOSAL TATTOO INJECTION  06/27/2022   Procedure: SUBMUCOSAL TATTOO INJECTION;  Surgeon: Dolores Frame, MD;  [facility]: [facility];  Service: Gastroenterology;;   TONSILLECTOMY  1954    OB History     Gravida  1   Para  0   Term      Preterm      AB  1   Living  0      SAB      IAB  1   Ectopic      Multiple      Live Births               Home Medications    Prior to Admission medications   Medication Sig Start Date End Date Taking? Authorizing Provider  buPROPion (WELLBUTRIN SR) 150 MG 12 hr tablet TAKE ONE TABLET BY MOUTH TWICE A DAY 06/02/21   Lilly Cove C, MD  Cholecalciferol (VITAMIN D3) 125 MCG (5000 UT) TABS Take 5,000 Units by mouth daily at 12 noon.    [provider]  Coenzyme Q10 100 MG capsule Take 100 mg by mouth daily at 12 noon.    [provider]  diazepam (VALIUM) 5 MG tablet Take 1 tablet (5 mg total) by mouth daily as needed for anxiety or muscle spasms. 06/20/22   Sater, Pearletha Furl, MD  docusate sodium (COLACE) 100 MG capsule Take 100 mg by mouth 2 (two) times daily.     [provider]  doxycycline (VIBRA-TABS) 100 MG tablet Take 1 tablet (100 mg total) by mouth 2 (two) times daily. 07/18/23   Anabel Halon, MD  estradiol (ESTRACE) 1 MG tablet Take 1 mg by mouth daily.    [provider]  inclisiran (LEQVIO) 284  MG/1.5ML SOSY injection Inject 284 mg into the skin once.    [provider]  mupirocin ointment (BACTROBAN) 2 % Apply 1 Application topically 2 (two) times daily. 05/27/23   Leath-Warren, Sadie Haber, NP  nitroGLYCERIN (NITROSTAT) 0.4 MG SL tablet Place 1 tablet under the tongue every 5 minutes as needed for chest pain. 09/12/21   Jonelle Sidle, MD  oxyCODONE-acetaminophen (PERCOCET/ROXICET) 5-325 MG tablet TAKE 1 TABLET DAILY AS NEEDED FOR SEVERE PAIN. 07/31/23   Levert Feinstein, MD  progesterone (PROMETRIUM) 200 MG capsule Take 1 capsule (200 mg total) by mouth daily. 02/21/21   Wilson Singer, MD  thyroid (NP THYROID) 60 MG tablet Take 1 tablet (60 mg total) by mouth daily before breakfast. 06/12/23   Anabel Halon, MD  traMADol (ULTRAM) 50 MG tablet Take 1 tablet (50 mg total) by mouth every 12 (twelve) hours as needed. 08/20/23   Amador Cunas, PA-C  warfarin (COUMADIN) 5 MG tablet Take 1 tablet to 1 1/2 tablets daily as directed by coumadin clinic 06/26/23   Jonelle Sidle, MD    Family History Family History  Problem Relation Age of Onset   COPD Mother    Hearing loss Mother    Stroke Mother    Heart disease Mother        chf, died at 36   Vision loss Mother    Varicose Veins Mother    Arthritis Father    Hypertension Father    Heart disease Father        cad, pvd, died at 76   Vision loss Father    Heart disease Sister        heart valve   Arthritis Sister    Depression Sister    Hyperlipidemia Brother    Hypertension Brother    Cancer - Prostate Brother     Social History Social History   Tobacco Use   Smoking status: Former    Current packs/day: 0.00    Average packs/day: 1 pack/day for 42.8 years (42.8 ttl pk-yrs)    Types: Cigarettes    Start date: 11/12/1965    Quit date: 09/06/2008    Years since quitting: 14.9   Smokeless tobacco: Never  Vaping Use   Vaping status: Never Used  Substance Use Topics   Alcohol use: Yes    Alcohol/week: 2.0  standard drinks of alcohol    Types: 1 Glasses of wine, 1 Shots of liquor per week    Comment: occasional   Drug use: Yes    Types: Hydrocodone, Marijuana    Comment: prescribed Percocet for myofacial pain syndrome     Allergies   Statins, Other, and Sulfa antibiotics   Review of Systems Review of Systems PER HPI  Physical Exam Triage Vital Signs ED Triage Vitals  Encounter Vitals Group     BP 08/17/23 1225 (!) 160/85     Systolic BP Percentile --      Diastolic BP Percentile --      Pulse Rate 08/17/23 1225 (!) 57     Resp 08/17/23 1225 18     Temp 08/17/23 1225 98.6 F (37 C)     Temp Source 08/17/23 1225 Oral     SpO2 08/17/23 1225 97 %     Weight --      Height --      Head Circumference --      Peak Flow --      Pain Score 08/17/23 1226 6     Pain Loc --      Pain Education --      Exclude from Growth Chart --    No data found.  Updated Vital Signs BP (!) 160/85 (BP Location: Right Arm)   Pulse (!) 57   Temp 98.6 F (37 C) (Oral)   Resp 18   LMP 07/08/1979 (Approximate)   SpO2 97%   Visual Acuity Right Eye Distance:   Left Eye Distance:   Bilateral Distance:    Right Eye Near:   Left Eye Near:    Bilateral Near:     Physical Exam Vitals and nursing note reviewed.  Constitutional:      Appearance: Normal appearance. She is not ill-appearing.  HENT:     Head: Atraumatic.  Eyes:  Extraocular Movements: Extraocular movements intact.     Conjunctiva/sclera: Conjunctivae normal.  Cardiovascular:     Rate and Rhythm: Normal rate and regular rhythm.     Heart sounds: Normal heart sounds.  Pulmonary:     Effort: Pulmonary effort is normal.     Breath sounds: Normal breath sounds.  Musculoskeletal:        General: Tenderness and signs of injury present.     Cervical back: Normal range of motion and neck supple.     Comments: Currently in wheelchair due to foot pain, ambulatory at baseline without assistance Severe ttp to left 5th toe into  5th metatarsal. ROM intact but severely painful  Skin:    General: Skin is warm and dry.     Findings: No bruising or erythema.  Neurological:     Mental Status: She is alert and oriented to person, place, and time.     Comments: Left foot neurovascularly intact  Psychiatric:        Mood and Affect: Mood normal.        Thought Content: Thought content normal.        Judgment: Judgment normal.      UC Treatments / Results  Labs (all labs ordered are listed, but only abnormal results are displayed) Labs Reviewed - No data to display  EKG   Radiology No results found.  Procedures Procedures (including critical care time)  Medications Ordered in UC Medications - No data to display  Initial Impression / Assessment and Plan / UC Course  I have reviewed the triage vital signs and the nursing notes.  Pertinent labs & imaging results that were available during my care of the patient were reviewed by me and considered in my medical decision making (see chart for details).     X-ray showing fracture of left 5th toe. Place in post op shoe, small supply of tramadol for severe pain in addition to OTC pain relievers and Podiatry f/u for recheck  Final Clinical Impressions(s) / UC Diagnoses   Final diagnoses:  Closed displaced fracture of proximal phalanx of lesser toe of left foot, initial encounter     Discharge Instructions      Wear the postop shoe when walking, elevate at rest, ice as needed.  I have sent over a small supply of tramadol to help with pain as needed.  Ibuprofen and Tylenol additionally.  Follow-up with the podiatrist for a recheck and further instructions    ED Prescriptions     Medication Sig Dispense Auth. Provider   traMADol (ULTRAM) 50 MG tablet Take 1 tablet (50 mg total) by mouth every 12 (twelve) hours as needed. 10 tablet Particia Nearing, [place]      I have reviewed the PDMP during this encounter.   Particia Nearing,  [place] 08/20/23 2214    Particia Nearing, [place] 08/20/23 2216

## 2023-08-21 ENCOUNTER — Encounter: Payer: Self-pay | Admitting: Podiatry

## 2023-08-28 ENCOUNTER — Ambulatory Visit: Payer: Medicare Other | Admitting: Podiatry

## 2023-09-08 ENCOUNTER — Other Ambulatory Visit: Payer: Self-pay | Admitting: Internal Medicine

## 2023-09-08 DIAGNOSIS — E039 Hypothyroidism, unspecified: Secondary | ICD-10-CM

## 2023-09-09 MED ORDER — THYROID 60 MG PO TABS: 60.0000 mg | ORAL_TABLET | Freq: Every day | ORAL | 0 refills | Status: DC

## 2023-09-11 ENCOUNTER — Other Ambulatory Visit: Payer: Self-pay | Admitting: Neurology

## 2023-09-11 MED ORDER — OXYCODONE-ACETAMINOPHEN 5-325 MG PO TABS: ORAL_TABLET | ORAL | 0 refills | Status: DC

## 2023-09-11 NOTE — Telephone Encounter (Signed)
Last seen 07/01/23 and next f/u 09/23/23. Last refilled 07/31/23 #30.

## 2023-09-16 DIAGNOSIS — B353 Tinea pedis: Secondary | ICD-10-CM | POA: Diagnosis not present

## 2023-09-16 DIAGNOSIS — L304 Erythema intertrigo: Secondary | ICD-10-CM | POA: Diagnosis not present

## 2023-09-16 DIAGNOSIS — L821 Other seborrheic keratosis: Secondary | ICD-10-CM | POA: Diagnosis not present

## 2023-09-23 ENCOUNTER — Encounter: Payer: Self-pay | Admitting: Neurology

## 2023-09-23 ENCOUNTER — Ambulatory Visit (INDEPENDENT_AMBULATORY_CARE_PROVIDER_SITE_OTHER): Payer: Medicare Other | Admitting: Neurology

## 2023-09-23 DIAGNOSIS — R419 Unspecified symptoms and signs involving cognitive functions and awareness: Secondary | ICD-10-CM

## 2023-09-23 DIAGNOSIS — R4701 Aphasia: Secondary | ICD-10-CM

## 2023-09-23 DIAGNOSIS — M549 Dorsalgia, unspecified: Secondary | ICD-10-CM

## 2023-09-23 DIAGNOSIS — U099 Post covid-19 condition, unspecified: Secondary | ICD-10-CM | POA: Diagnosis not present

## 2023-09-23 DIAGNOSIS — M6248 Contracture of muscle, other site: Secondary | ICD-10-CM

## 2023-09-23 DIAGNOSIS — R42 Dizziness and giddiness: Secondary | ICD-10-CM | POA: Diagnosis not present

## 2023-09-23 DIAGNOSIS — E782 Mixed hyperlipidemia: Secondary | ICD-10-CM | POA: Diagnosis not present

## 2023-09-23 DIAGNOSIS — R4789 Other speech disturbances: Secondary | ICD-10-CM | POA: Diagnosis not present

## 2023-09-23 DIAGNOSIS — M7918 Myalgia, other site: Secondary | ICD-10-CM

## 2023-09-23 DIAGNOSIS — R439 Unspecified disturbances of smell and taste: Secondary | ICD-10-CM | POA: Diagnosis not present

## 2023-09-23 DIAGNOSIS — M62838 Other muscle spasm: Secondary | ICD-10-CM

## 2023-09-23 MED ORDER — ONABOTULINUMTOXINA 100 UNITS IJ SOLR
100.0000 [IU] | Freq: Once | INTRAMUSCULAR | Status: AC
Start: 2023-09-23 — End: 2023-09-23
  Administered 2023-09-23: 100 [IU] via INTRAMUSCULAR

## 2023-09-23 NOTE — Progress Notes (Signed)
Botox- 100 units x 2 vial Lot: [id number] Expiration: 01/2026 NDC: 8416-6063-01  Bacteriostatic 0.9% Sodium Chloride- 4mL total Lot: [id number] Expiration: 02/11/2024 NDC: 23557-322-02  Dx: R42.70 B/B

## 2023-09-23 NOTE — Progress Notes (Signed)
[facility]  PATIENT: Heather [name] DOB: 05-18-1946  REFERRING DOCTOR OR PCP:  Rica Mast SOURCE: patient  _________________________________   HISTORICAL  CHIEF COMPLAINT:  Chief Complaint  Patient presents with   Room 10    Pt is here Alone. Pt states that things have been going okay since his last appointment.     HISTORY OF PRESENT ILLNESS:  Heather [name] is a 77 y.o. woman who has had chronic right-sided mid back pain since 2013.      Update 09/23/2023 Her myofascial pain in the right trunk has done well since the last Botox injection.  The Botox has markedly reduced the thoracic pain.  Pain fluctuates but is much better since Botox.    When she has more of the myofascial pain pain, she will take half of a Percocet.  If she notes more spasticity she will take half of Valium.  Prescriptions have lasted multiple months.  Since the COVID infection, she has noted reduced smell and taste.   She also has had more fatigue    Last year, she had noticed more cognitive issues and had formal neurocognitive testing which did not find any neurocognitive deficit.  She feels these issues are better than last year and stable overall.  MRI of the brain showed atrophy.   There is no lobar predominant pattern.    She sleeps well   She never combines with a percocet.    She is on coumadin for atrial fibrillation.  If she notes more palpitations, she takes a valium (rare).     She had Covid-19 again (#3).  She felt very dizzy and more fatigue afterwards,    IMAGING:  MRI results from 10/14/2013: The MRI of the brain showed age related atrophy and minimal chronic microvascular ischemic change. MRI of the cervical spine showed multilevel mild degenerative changes with left paramedian disc herniation at C3-C4 and left disc protrusion at C4-C5 and C5-C6 and midline disc herniation at C6-C7. There was no report of nerve root compression. MRI of the thoracic spine showed disc  desiccation but no herniation or protrusions. MRI of the lumbar spine showed disc bulges at T12-L1 and L2-L3 and disc bulge with facet hypertrophy at L3-L4 and disc bulge with right foraminal annular tear at L4-L5.  MRI brain 7/4/2-22 was personally reviewed.  It shows mild generalized atrophy.  No acute findings.     REVIEW OF SYSTEMS: Constitutional: No fevers, chills, sweats, or change in appetite Eyes: No visual changes, double vision, eye pain Ear, nose and throat: No hearing loss, ear pain, nasal congestion, sore throat Cardiovascular: No chest pain, palpitations.   She has atrial fibrillation and is on warfarin Respiratory:  No shortness of breath at rest or with exertion.   No wheezes GastrointestinaI: No nausea, vomiting, diarrhea, abdominal pain, fecal incontinence Genitourinary:  No dysuria, urinary retention or frequency.  No nocturia. Musculoskeletal:  No neck pain.  She has back pain/thoracic pain as above Integumentary: No rash, pruritus, skin lesions Neurological: as above Psychiatric: No depression at this time.  No anxiety Endocrine: No palpitations, diaphoresis, change in appetite, change in weigh or increased thirst Hematologic/Lymphatic:  No anemia, purpura, petechiae. Allergic/Immunologic: No itchy/runny eyes, nasal congestion, recent allergic reactions, rashes  ALLERGIES: Allergies  Allergen Reactions   Statins Other (See Comments)    Muscle weakness   Other     EKG pads causes blisters    Sulfa Antibiotics Rash    HOME MEDICATIONS:  Current Outpatient Medications:  buPROPion (WELLBUTRIN SR) 150 MG 12 hr tablet, TAKE ONE TABLET BY MOUTH TWICE A DAY, Disp: 180 tablet, Rfl: 1   Cholecalciferol (VITAMIN D3) 125 MCG (5000 UT) TABS, Take 5,000 Units by mouth daily at 12 noon., Disp: , Rfl:    Coenzyme Q10 100 MG capsule, Take 100 mg by mouth daily at 12 noon., Disp: , Rfl:    diazepam (VALIUM) 5 MG tablet, Take 1 tablet (5 mg total) by mouth daily as needed for  anxiety or muscle spasms., Disp: 30 tablet, Rfl: 0   docusate sodium (COLACE) 100 MG capsule, Take 100 mg by mouth 2 (two) times daily. , Disp: , Rfl:    estradiol (ESTRACE) 1 MG tablet, Take 1 mg by mouth daily., Disp: , Rfl:    inclisiran (LEQVIO) 284 MG/1.5ML SOSY injection, Inject 284 mg into the skin once., Disp: , Rfl:    oxyCODONE-acetaminophen (PERCOCET/ROXICET) 5-325 MG tablet, TAKE 1 TABLET DAILY AS NEEDED FOR SEVERE PAIN., Disp: 30 tablet, Rfl: 0   progesterone (PROMETRIUM) 200 MG capsule, Take 1 capsule (200 mg total) by mouth daily., Disp: 90 capsule, Rfl: 1   thyroid (NP THYROID) 60 MG tablet, Take 1 tablet (60 mg total) by mouth daily before breakfast., Disp: 90 tablet, Rfl: 0   warfarin (COUMADIN) 5 MG tablet, Take 1 tablet to 1 1/2 tablets daily as directed by coumadin clinic, Disp: 135 tablet, Rfl: 1   doxycycline (VIBRA-TABS) 100 MG tablet, Take 1 tablet (100 mg total) by mouth 2 (two) times daily. (Patient not taking: Reported on 09/23/2023), Disp: 14 tablet, Rfl: 0   mupirocin ointment (BACTROBAN) 2 %, Apply 1 Application topically 2 (two) times daily. (Patient not taking: Reported on 09/23/2023), Disp: 30 g, Rfl: 0   nitroGLYCERIN (NITROSTAT) 0.4 MG SL tablet, Place 1 tablet under the tongue every 5 minutes as needed for chest pain. (Patient not taking: Reported on 09/23/2023), Disp: 25 tablet, Rfl: 1   traMADol (ULTRAM) 50 MG tablet, Take 1 tablet (50 mg total) by mouth every 12 (twelve) hours as needed. (Patient not taking: Reported on 09/23/2023), Disp: 10 tablet, Rfl: 0  PAST MEDICAL HISTORY: Past Medical History:  Diagnosis Date   Adrenal adenoma    Allergy    Anemia    Anxiety    CAD (coronary artery disease)    a. 50-60% mid LAD at cardiac catheterization 2014 [facility]) b. non-obstructive CAD by cath in 09/2018   Cataract    Depression    DJD (degenerative joint disease) of cervical spine 09/12/2016   Dysrhythmia    H. pylori infection 07/19/2017   History  of cardiomyopathy    History of COVID-19 12/10/2019   History of tobacco use    Hyperlipidemia    Hypothyroidism 08/24/2022   Mid back pain 02/08/2017   Neuromuscular disorder (HCC)    PAF (paroxysmal atrial fibrillation) (HCC)    Diagnosis July 2017 - spontaneously resolved   Smell disturbance 06/13/2021   Statin intolerance     PAST SURGICAL HISTORY: Past Surgical History:  Procedure Laterality Date   ABDOMINAL HYSTERECTOMY  1980   endometriosis   BIOPSY  07/18/2021   Procedure: BIOPSY;  Surgeon: Dolores Frame, MD;  [facility];  Service: Gastroenterology;;   BREAST BIOPSY Right 07/29/2023   MM RT BREAST BX W LOC DEV 1ST LESION IMAGE BX SPEC STEREO GUIDE 07/29/2023 [facility]   CATARACT EXTRACTION Left 11/2018   COLONOSCOPY WITH PROPOFOL N/A 06/27/2022   Procedure: COLONOSCOPY WITH PROPOFOL;  Surgeon:  Dolores Frame, MD;  [facility]: [facility];  Service: Gastroenterology;  Laterality: N/A;  11:15   CORONARY PRESSURE/FFR STUDY N/A 06/01/2021   Procedure: INTRAVASCULAR PRESSURE WIRE/FFR STUDY;  Surgeon: Lyn Records, MD;  [facility];  Service: Cardiovascular;  Laterality: N/A;   ESOPHAGOGASTRODUODENOSCOPY (EGD) WITH PROPOFOL N/A 07/18/2021   Procedure: ESOPHAGOGASTRODUODENOSCOPY (EGD) WITH PROPOFOL;  Surgeon: Dolores Frame, MD;  [facility];  Service: Gastroenterology;  Laterality: N/A;  12:00   EYE SURGERY  cataracts  2021/2022   HEMOSTASIS CLIP PLACEMENT  06/27/2022   Procedure: HEMOSTASIS CLIP PLACEMENT;  Surgeon: Dolores Frame, MD;  [facility];  Service: Gastroenterology;;   LEFT HEART CATH AND CORONARY ANGIOGRAPHY N/A 09/25/2018   Procedure: LEFT HEART CATH AND CORONARY ANGIOGRAPHY;  Surgeon: Lyn Records, MD;  [facility];  Service: Cardiovascular;  Laterality: N/A;   LEFT HEART CATH AND CORONARY ANGIOGRAPHY N/A 06/01/2021   Procedure: LEFT HEART  CATH AND CORONARY ANGIOGRAPHY;  Surgeon: Lyn Records, MD;  [facility];  Service: Cardiovascular;  Laterality: N/A;   POLYPECTOMY  06/27/2022   Procedure: POLYPECTOMY;  Surgeon: Dolores Frame, MD;  [facility];  Service: Gastroenterology;;   Gaspar Bidding DILATION  07/18/2021   Procedure: Gaspar Bidding DILATION;  Surgeon: Marguerita Merles, Reuel Boom, MD;  [facility];  Service: Gastroenterology;;   SUBMUCOSAL LIFTING INJECTION  06/27/2022   Procedure: SUBMUCOSAL LIFTING INJECTION;  Surgeon: Marguerita Merles, Reuel Boom, MD;  [facility];  Service: Gastroenterology;;   SUBMUCOSAL TATTOO INJECTION  06/27/2022   Procedure: SUBMUCOSAL TATTOO INJECTION;  Surgeon: Marguerita Merles, Reuel Boom, MD;  [facility];  Service: Gastroenterology;;   TONSILLECTOMY  1954    FAMILY HISTORY: Family History  Problem Relation Age of Onset   COPD Mother    Hearing loss Mother    Stroke Mother    Heart disease Mother        chf, died at 45   Vision loss Mother    Varicose Veins Mother    Arthritis Father    Hypertension Father    Heart disease Father        cad, pvd, died at 48   Vision loss Father    Heart disease Sister        heart valve   Arthritis Sister    Depression Sister    Hyperlipidemia Brother    Hypertension Brother    Cancer - Prostate Brother     SOCIAL HISTORY:  Social History   Socioeconomic History   Marital status: Married    Spouse name: Al   Number of children: 0   Years of education: 18   Highest education level: Master's degree (e.g., MA, MS, MEng, MEd, MSW, MBA)  Occupational History   Occupation: Clinical research associate    Comment: from home  Tobacco Use   Smoking status: Former    Current packs/day: 0.00    Average packs/day: 1 pack/day for 42.8 years (42.8 ttl pk-yrs)    Types: Cigarettes    Start date: 11/12/1965    Quit date: 09/06/2008    Years since quitting: 15.0   Smokeless tobacco: Never  Vaping Use   Vaping  status: Never Used  Substance and Sexual Activity   Alcohol use: Yes    Alcohol/week: 2.0 standard drinks of alcohol    Types: 1 Glasses of wine, 1 Shots of liquor per week    Comment: occasional   Drug  use: Yes    Types: Hydrocodone, Marijuana    Comment: prescribed Percocet for myofacial pain syndrome   Sexual activity: Yes    Birth control/protection: Post-menopausal  Other Topics Concern   Not on file  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   Lives with husband AL   No children   Healthy diet and lifestyle   Social Determinants of Health   Financial Resource Strain: Low Risk  (02/09/2023)   Overall Financial Resource Strain (CARDIA)    Difficulty of Paying Living Expenses: Not very hard  Food Insecurity: No Food Insecurity (02/09/2023)   Hunger Vital Sign    Worried About Running Out of Food in the Last Year: Never true    Ran Out of Food in the Last Year: Never true  Transportation Needs: No Transportation Needs (02/09/2023)   PRAPARE - Administrator, Civil Service (Medical): No    Lack of Transportation (Non-Medical): No  Physical Activity: Unknown (02/09/2023)   Exercise Vital Sign    Days of Exercise per Week: 3 days    Minutes of Exercise per Session: Not on file  Recent Concern: Physical Activity - Insufficiently Active (02/09/2023)   Exercise Vital Sign    Days of Exercise per Week: 3 days    Minutes of Exercise per Session: 30 min  Stress: Stress Concern Present (02/09/2023)   [employer] of Occupational Health - Occupational Stress Questionnaire    Feeling of Stress : To some extent  Social Connections: Unknown (02/09/2023)   Social Connection and Isolation Panel [NHANES]    Frequency of Communication with Friends and Family: More than three times a week    Frequency of Social Gatherings with Friends and Family: Twice a week    Attends Religious Services: Never    Database administrator or Organizations: Not on file    Attends Tax inspector Meetings: Not on file    Marital Status: Married  Intimate Partner Violence: Not At Risk (09/13/2021)   Humiliation, Afraid, Rape, and Kick questionnaire    Fear of Current or Ex-Partner: No    Emotionally Abused: No    Physically Abused: No    Sexually Abused: No     PHYSICAL EXAM   General: The patient is well-developed and well-nourished and in no acute distress.  Head is normocephalic and atraumatic.   Musculoskeletal : There is tenderness in the mid to lower thoracic intercoastal muscles from T6-T9 on the right .  The neck was nontender today.  Neurologic Exam  Mental status: The patient is alert and oriented x 3 at the time of the examination.   Focus and attention seem normal.  Speech appeared normal.  No aphasia was noted during the conversation and she had no problems, with the right words Cranial nerves: Extraocular muscles are intact.  Facial strength is normal.   . No obvious hearing deficits are noted.  Motor: She has normal muscle tone, muscle bulk and muscle strength in the arms or legs.  gait and station: Gait and station are normal.  The Romberg is negative    ASSESSMENT AND PLAN  1. Myofascial pain syndrome   2. Smell disturbance   3. Cognitive complaints      1.  Inject 100 units of Botox using 30-gauge needle split into tender points below the T6-T9 ribs into the intercostal muscles near the midclavicular line.  There were no complications and she tolerated the injections well.   2.   Cognitive  concerns have improved.  Neurocognitive testing was normal.  I do not think that she has a neurodegenerative process.      3.  She should continue to stay active and exercise as tolerated.  Can take rare Percocet as needed for days when there is more intense pain.  If severe spasms take Valium  4.   She will return in 3-4 months for next Botox or sooner if  new or worsening neurologic symptoms.  Casen Pryor A. Epimenio Foot, MD, PhD 09/23/2023, 3:38 PM Certified in  Neurology, Clinical Neurophysiology, Sleep Medicine, Pain Medicine and Neuroimaging  [facility] [address], [address] [place], [place] [address] [phone]

## 2023-09-25 ENCOUNTER — Ambulatory Visit: Payer: Medicare Other | Attending: Cardiology | Admitting: *Deleted

## 2023-09-25 DIAGNOSIS — Z5181 Encounter for therapeutic drug level monitoring: Secondary | ICD-10-CM | POA: Diagnosis not present

## 2023-09-25 DIAGNOSIS — I480 Paroxysmal atrial fibrillation: Secondary | ICD-10-CM | POA: Diagnosis not present

## 2023-09-25 LAB — POCT INR: INR: 1.6 — AB (ref 2.0–3.0)

## 2023-09-25 NOTE — Patient Instructions (Signed)
Take warfarin 2 tablets tonight, 1 1/2 tablet tomorrow night then resume 1 1/2 tablets daily except 1 tablet on Sundays, Tuesdays and Thursdays Recheck INR in 3 wks.

## 2023-10-14 ENCOUNTER — Other Ambulatory Visit: Payer: Self-pay | Admitting: Neurology

## 2023-10-15 ENCOUNTER — Encounter: Payer: Self-pay | Admitting: Internal Medicine

## 2023-10-15 ENCOUNTER — Ambulatory Visit (INDEPENDENT_AMBULATORY_CARE_PROVIDER_SITE_OTHER): Payer: Medicare Other | Admitting: Internal Medicine

## 2023-10-15 VITALS — BP 154/88 | HR 60 | Ht 64.0 in

## 2023-10-15 DIAGNOSIS — I480 Paroxysmal atrial fibrillation: Secondary | ICD-10-CM

## 2023-10-15 DIAGNOSIS — Z23 Encounter for immunization: Secondary | ICD-10-CM | POA: Diagnosis not present

## 2023-10-15 DIAGNOSIS — I2510 Atherosclerotic heart disease of native coronary artery without angina pectoris: Secondary | ICD-10-CM

## 2023-10-15 DIAGNOSIS — Z7901 Long term (current) use of anticoagulants: Secondary | ICD-10-CM

## 2023-10-15 DIAGNOSIS — E7801 Familial hypercholesterolemia: Secondary | ICD-10-CM | POA: Diagnosis not present

## 2023-10-15 DIAGNOSIS — E039 Hypothyroidism, unspecified: Secondary | ICD-10-CM

## 2023-10-15 DIAGNOSIS — R42 Dizziness and giddiness: Secondary | ICD-10-CM | POA: Diagnosis not present

## 2023-10-15 DIAGNOSIS — I10 Essential (primary) hypertension: Secondary | ICD-10-CM

## 2023-10-15 DIAGNOSIS — U099 Post covid-19 condition, unspecified: Secondary | ICD-10-CM | POA: Diagnosis not present

## 2023-10-15 HISTORY — DX: Essential (primary) hypertension: I10

## 2023-10-15 MED ORDER — OXYCODONE-ACETAMINOPHEN 5-325 MG PO TABS: ORAL_TABLET | ORAL | 0 refills | Status: DC

## 2023-10-15 NOTE — Assessment & Plan Note (Signed)
BP Readings from Last 1 Encounters:  10/15/23 (!) 154/88   Uncontrolled, but she attributes it to her being nervous today as she had to rush today Advised to check BP at home and notify after 2 weeks Check CMP and TSH Counseled for compliance with the medications Advised DASH diet and moderate exercise/walking, at least 150 mins/week

## 2023-10-15 NOTE — Assessment & Plan Note (Signed)
Dizziness likely due to vertigo, in the setting of recent URTI Offered meclizine, but she prefers to avoid medication Vestibular exercises material provided Avoid sudden positional changes

## 2023-10-15 NOTE — Assessment & Plan Note (Signed)
On Coumadin Not on any rate controlling agents Needs to follow up with cardiology, may need beta-blocker for rate control, can be helpful for BP control as well

## 2023-10-15 NOTE — Assessment & Plan Note (Signed)
Lab Results  Component Value Date   TSH 6.100 (H) 01/28/2023    Did not tolerate levothyroxine On NP thyroid 60 mg MWF, 30 mg on TThSaS Increased dose of NP thyroid to 60 mg once daily after checking last TSH Recheck TSH and free T4

## 2023-10-15 NOTE — Assessment & Plan Note (Signed)
Her chronic fatigue could be due to post COVID syndrome Dizziness unclear if related to COVID infection, but more likely due to vertigo Advised to maintain adequate hydration

## 2023-10-15 NOTE — Patient Instructions (Signed)
Please perform vestibular exercises as shown in the following video:  Inner Ear Balance Home Exercises to Treat Dizziness (Vestibular Home Exercises)  Please take Meclizine 12.5 mg as needed for dizziness.  Please get fasting blood tests done within a week.

## 2023-10-15 NOTE — Assessment & Plan Note (Signed)
Did not tolerate statin Had been taking NP thyroid Cholesterol profile concerning for familial hypercholesterolemia Referred to lipid clinic - on Leqvio now

## 2023-10-15 NOTE — Assessment & Plan Note (Addendum)
S/p stent placement Has history of Takotsubo cardiomyopathy Followed by cardiology On Coumadin for A. fib, did not tolerate statin in the past On Leqvio for HLD

## 2023-10-15 NOTE — Progress Notes (Signed)
Established Patient Office Visit  Subjective:  Patient ID: Heather [name], female    DOB: November 15, 1945  Age: 77 y.o. MRN: [id number]  CC:  Chief Complaint  Patient presents with   Long covid     Long covid symptoms    Dizziness    Room spinning sensation    HPI Heather [name] is a 77 y.o. female with past medical history of CAD, PAF, HLD, myofascial pain syndrome and depression with anxiety who presents for c/o intermittent dizziness.  She reports episodes of dizziness, described as room spinning sensation even in sitting position.  She has felt dizzy upon standing up as well.  Of note, she has had recurrent URTI recently, last episode in the last week.  She denies any ear pain or discharge currently.  She was concerned about POTS, but her BP at baseline is elevated today.  Her orthostatic vitals were negative.  She reports chronic fatigue, and attributes it to post-COVID syndrome.  Her last COVID infection was in 08/24.  She had been taking NP thyroid for HLD despite her A. fib.  Her TSH was <0.01 in 12/2020. She prefers to take NP thyroid as she did not tolerate Levothyroxine.   She has history of CAD s/p stent placement and paroxysmal atrial fibrillation.  She is on Coumadin currently.  She sees cardiologist for it.  She has a history of HLD, but did not tolerate 3 different statins in the past.  She is on Leqvio currently.   Past Medical History:  Diagnosis Date   Adrenal adenoma    Allergy    Anemia    Anxiety    CAD (coronary artery disease)    a. 50-60% mid LAD at cardiac catheterization 2014 [facility]) b. non-obstructive CAD by cath in 09/2018   Cataract    Depression    DJD (degenerative joint disease) of cervical spine 09/12/2016   Dysrhythmia    Essential hypertension 10/15/2023   H. pylori infection 07/19/2017   History of cardiomyopathy    History of COVID-19 12/10/2019   History of tobacco use    Hyperlipidemia    Hypothyroidism 08/24/2022   Mid back pain  02/08/2017   Neuromuscular disorder (HCC)    PAF (paroxysmal atrial fibrillation) (HCC)    Diagnosis July 2017 - spontaneously resolved   Smell disturbance 06/13/2021   Statin intolerance     Past Surgical History:  Procedure Laterality Date   ABDOMINAL HYSTERECTOMY  1980   endometriosis   BIOPSY  07/18/2021   Procedure: BIOPSY;  Surgeon: Dolores Frame, MD;  [facility];  Service: Gastroenterology;;   BREAST BIOPSY Right 07/29/2023   MM RT BREAST BX W LOC DEV 1ST LESION IMAGE BX SPEC STEREO GUIDE 07/29/2023 [facility]   CATARACT EXTRACTION Left 11/2018   COLONOSCOPY WITH PROPOFOL N/A 06/27/2022   Procedure: COLONOSCOPY WITH PROPOFOL;  Surgeon: Dolores Frame, MD;  [facility];  Service: Gastroenterology;  Laterality: N/A;  11:15   CORONARY PRESSURE/FFR STUDY N/A 06/01/2021   Procedure: INTRAVASCULAR PRESSURE WIRE/FFR STUDY;  Surgeon: Lyn Records, MD;  [facility]: [facility];  Service: Cardiovascular;  Laterality: N/A;   ESOPHAGOGASTRODUODENOSCOPY (EGD) WITH PROPOFOL N/A 07/18/2021   Procedure: ESOPHAGOGASTRODUODENOSCOPY (EGD) WITH PROPOFOL;  Surgeon: Dolores Frame, MD;  [facility];  Service: Gastroenterology;  Laterality: N/A;  12:00   EYE SURGERY  cataracts  2021/2022   HEMOSTASIS CLIP PLACEMENT  06/27/2022   Procedure: HEMOSTASIS CLIP PLACEMENT;  Surgeon: Marguerita Merles,  Reuel Boom, MD;  [facility];  Service: Gastroenterology;;   LEFT HEART CATH AND CORONARY ANGIOGRAPHY N/A 09/25/2018   Procedure: LEFT HEART CATH AND CORONARY ANGIOGRAPHY;  Surgeon: Lyn Records, MD;  Location: [facility] INVASIVE CV LAB;  Service: Cardiovascular;  Laterality: N/A;   LEFT HEART CATH AND CORONARY ANGIOGRAPHY N/A 06/01/2021   Procedure: LEFT HEART CATH AND CORONARY ANGIOGRAPHY;  Surgeon: Lyn Records, MD;  [facility];  Service: Cardiovascular;  Laterality: N/A;   POLYPECTOMY  06/27/2022    Procedure: POLYPECTOMY;  Surgeon: Dolores Frame, MD;  [facility];  Service: Gastroenterology;;   Gaspar Bidding DILATION  07/18/2021   Procedure: Gaspar Bidding DILATION;  Surgeon: Marguerita Merles, Reuel Boom, MD;  [facility];  Service: Gastroenterology;;   SUBMUCOSAL LIFTING INJECTION  06/27/2022   Procedure: SUBMUCOSAL LIFTING INJECTION;  Surgeon: Marguerita Merles, Reuel Boom, MD;  [facility];  Service: Gastroenterology;;   SUBMUCOSAL TATTOO INJECTION  06/27/2022   Procedure: SUBMUCOSAL TATTOO INJECTION;  Surgeon: Marguerita Merles, Reuel Boom, MD;  [facility];  Service: Gastroenterology;;   TONSILLECTOMY  1954    Family History  Problem Relation Age of Onset   COPD Mother    Hearing loss Mother    Stroke Mother    Heart disease Mother        chf, died at 82   Vision loss Mother    Varicose Veins Mother    Arthritis Father    Hypertension Father    Heart disease Father        cad, pvd, died at 11   Vision loss Father    Heart disease Sister        heart valve   Arthritis Sister    Depression Sister    Hyperlipidemia Brother    Hypertension Brother    Cancer - Prostate Brother     Social History   Socioeconomic History   Marital status: Married    Spouse name: Al   Number of children: 0   Years of education: 18   Highest education level: Master's degree (e.g., MA, MS, MEng, MEd, MSW, MBA)  Occupational History   Occupation: Clinical research associate    Comment: from home  Tobacco Use   Smoking status: Former    Current packs/day: 0.00    Average packs/day: 1 pack/day for 42.8 years (42.8 ttl pk-yrs)    Types: Cigarettes    Start date: 11/12/1965    Quit date: 09/06/2008    Years since quitting: 15.1   Smokeless tobacco: Never  Vaping Use   Vaping status: Never Used  Substance and Sexual Activity   Alcohol use: Yes    Alcohol/week: 2.0 standard drinks of alcohol    Types: 1 Glasses of wine, 1 Shots of liquor per week    Comment: occasional    Drug use: Yes    Types: Hydrocodone, Marijuana    Comment: prescribed Percocet for myofacial pain syndrome   Sexual activity: Yes    Birth control/protection: Post-menopausal  Other Topics Concern   Not on file  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   Lives with husband AL   No children   Healthy diet and lifestyle   Social Determinants of Health   Financial Resource Strain: Low Risk  (10/11/2023)   Overall Financial Resource Strain (CARDIA)    Difficulty of Paying Living Expenses: Not very hard  Food Insecurity: No Food Insecurity (10/11/2023)   Hunger Vital Sign    Worried About  Running Out of Food in the Last Year: Never true    Ran Out of Food in the Last Year: Never true  Transportation Needs: No Transportation Needs (10/11/2023)   PRAPARE - Administrator, Civil Service (Medical): No    Lack of Transportation (Non-Medical): No  Physical Activity: Sufficiently Active (10/11/2023)   Exercise Vital Sign    Days of Exercise per Week: 3 days    Minutes of Exercise per Session: 120 min  Stress: No Stress Concern Present (10/11/2023)   [employer] of Occupational Health - Occupational Stress Questionnaire    Feeling of Stress : Not at all  Social Connections: Moderately Isolated (10/11/2023)   Social Connection and Isolation Panel [NHANES]    Frequency of Communication with Friends and Family: Twice a week    Frequency of Social Gatherings with Friends and Family: Once a week    Attends Religious Services: Never    Database administrator or Organizations: No    Attends Engineer, structural: Not on file    Marital Status: Married  Catering manager Violence: Not At Risk (09/13/2021)   Humiliation, Afraid, Rape, and Kick questionnaire    Fear of Current or Ex-Partner: No    Emotionally Abused: No    Physically Abused: No    Sexually Abused: No    Outpatient Medications Prior to Visit  Medication Sig Dispense Refill   buPROPion  (WELLBUTRIN SR) 150 MG 12 hr tablet TAKE ONE TABLET BY MOUTH TWICE A DAY 180 tablet 1   Cholecalciferol (VITAMIN D3) 125 MCG (5000 UT) TABS Take 5,000 Units by mouth daily at 12 noon.     Coenzyme Q10 100 MG capsule Take 100 mg by mouth daily at 12 noon.     diazepam (VALIUM) 5 MG tablet Take 1 tablet (5 mg total) by mouth daily as needed for anxiety or muscle spasms. 30 tablet 0   docusate sodium (COLACE) 100 MG capsule Take 100 mg by mouth 2 (two) times daily.      estradiol (ESTRACE) 1 MG tablet Take 1 mg by mouth daily.     inclisiran (LEQVIO) 284 MG/1.5ML SOSY injection Inject 284 mg into the skin once.     mupirocin ointment (BACTROBAN) 2 % Apply 1 Application topically 2 (two) times daily. (Patient not taking: Reported on 09/23/2023) 30 g 0   nitroGLYCERIN (NITROSTAT) 0.4 MG SL tablet Place 1 tablet under the tongue every 5 minutes as needed for chest pain. (Patient not taking: Reported on 09/23/2023) 25 tablet 1   oxyCODONE-acetaminophen (PERCOCET/ROXICET) 5-325 MG tablet TAKE 1 TABLET DAILY AS NEEDED FOR SEVERE PAIN. 30 tablet 0   progesterone (PROMETRIUM) 200 MG capsule Take 1 capsule (200 mg total) by mouth daily. 90 capsule 1   thyroid (NP THYROID) 60 MG tablet Take 1 tablet (60 mg total) by mouth daily before breakfast. 90 tablet 0   traMADol (ULTRAM) 50 MG tablet Take 1 tablet (50 mg total) by mouth every 12 (twelve) hours as needed. (Patient not taking: Reported on 09/23/2023) 10 tablet 0   warfarin (COUMADIN) 5 MG tablet Take 1 tablet to 1 1/2 tablets daily as directed by coumadin clinic 135 tablet 1   doxycycline (VIBRA-TABS) 100 MG tablet Take 1 tablet (100 mg total) by mouth 2 (two) times daily. (Patient not taking: Reported on 09/23/2023) 14 tablet 0   No facility-administered medications prior to visit.    Allergies  Allergen Reactions   Statins Other (See Comments)  Muscle weakness   Other     EKG pads causes blisters    Sulfa Antibiotics Rash    ROS Review of  Systems  Constitutional:  Positive for fatigue. Negative for chills and fever.  HENT:  Negative for congestion, sinus pressure, sinus pain and sore throat.   Eyes:  Negative for pain and discharge.  Respiratory:  Negative for cough and shortness of breath.   Cardiovascular:  Negative for chest pain and palpitations.  Gastrointestinal:  Negative for abdominal pain, diarrhea, nausea and vomiting.  Endocrine: Negative for polydipsia and polyuria.  Genitourinary:  Negative for dysuria and hematuria.  Musculoskeletal:  Negative for neck pain and neck stiffness.  Skin:  Negative for rash.  Neurological:  Positive for dizziness and numbness. Negative for weakness.  Psychiatric/Behavioral:  Negative for agitation and behavioral problems. The patient is nervous/anxious.       Objective:    Physical Exam Vitals reviewed.  Constitutional:      General: She is not in acute distress.    Appearance: She is not diaphoretic.  HENT:     Head: Normocephalic and atraumatic.     Right Ear: External ear normal. There is no impacted cerumen.     Left Ear: External ear normal. There is no impacted cerumen.     Nose: Nose normal.     Mouth/Throat:     Mouth: Mucous membranes are moist.  Eyes:     General: No scleral icterus.    Extraocular Movements: Extraocular movements intact.  Cardiovascular:     Rate and Rhythm: Normal rate and regular rhythm.     Heart sounds: Normal heart sounds. No murmur heard. Pulmonary:     Breath sounds: Normal breath sounds. No wheezing or rales.  Musculoskeletal:     Cervical back: Neck supple. No tenderness.     Right lower leg: No edema.     Left lower leg: No edema.  Skin:    General: Skin is warm.     Findings: No rash.  Neurological:     General: No focal deficit present.     Mental Status: She is alert and oriented to person, place, and time.  Psychiatric:        Mood and Affect: Mood normal.        Behavior: Behavior normal.     BP (!) 154/88 (BP  Location: Right Arm)   Pulse 60   Ht 5\' 4"  (1.626 m)   LMP 07/08/1979 (Approximate)   SpO2 98%   BMI 27.12 kg/m  Wt Readings from Last 3 Encounters:  03/05/23 158 lb (71.7 kg)  11/26/22 160 lb (72.6 kg)  10/08/22 158 lb (71.7 kg)    Lab Results  Component Value Date   TSH 6.100 (H) 01/28/2023   Lab Results  Component Value Date   WBC 5.8 08/23/2022   HGB 11.8 08/23/2022   HCT 35.6 08/23/2022   MCV 88 08/23/2022   PLT 230 08/23/2022   Lab Results  Component Value Date   NA 142 08/23/2022   K 4.8 08/23/2022   CO2 22 08/23/2022   GLUCOSE 96 08/23/2022   BUN 24 08/23/2022   CREATININE 0.82 08/23/2022   BILITOT 0.5 06/05/2020   ALKPHOS 55 06/05/2020   AST 21 06/05/2020   ALT 20 06/05/2020   PROT 7.4 06/05/2020   ALBUMIN 4.3 06/05/2020   CALCIUM 9.7 08/23/2022   ANIONGAP 6 05/15/2021   EGFR 74 08/23/2022   Lab Results  Component Value Date   CHOL  250 (H) 06/17/2023   Lab Results  Component Value Date   HDL 51 06/17/2023   Lab Results  Component Value Date   LDLCALC 157 (H) 06/17/2023   Lab Results  Component Value Date   TRIG 229 (H) 06/17/2023   Lab Results  Component Value Date   CHOLHDL 4.9 (H) 06/17/2023   No results found for: "HGBA1C"    Assessment & Plan:   Problem List Items Addressed This Visit       Cardiovascular and Mediastinum   CAD (coronary artery disease)    S/p stent placement Has history of Takotsubo cardiomyopathy Followed by cardiology On Coumadin for A. fib, did not tolerate statin in the past On Leqvio for HLD      PAF (paroxysmal atrial fibrillation) (HCC)    On Coumadin Not on any rate controlling agents Needs to follow up with cardiology, may need beta-blocker for rate control, can be helpful for BP control as well      Essential hypertension    BP Readings from Last 1 Encounters:  10/15/23 (!) 154/88   Uncontrolled, but she attributes it to her being nervous today as she had to rush today Advised to check  BP at home and notify after 2 weeks Check CMP and TSH Counseled for compliance with the medications Advised DASH diet and moderate exercise/walking, at least 150 mins/week         Endocrine   Hypothyroidism    Lab Results  Component Value Date   TSH 6.100 (H) 01/28/2023    Did not tolerate levothyroxine On NP thyroid 60 mg MWF, 30 mg on TThSaS Increased dose of NP thyroid to 60 mg once daily after checking last TSH Recheck TSH and free T4      Relevant Orders   CMP14+EGFR   TSH + free T4     Other   HLD (hyperlipidemia)    Did not tolerate statin Had been taking NP thyroid Cholesterol profile concerning for familial hypercholesterolemia Referred to lipid clinic - on Leqvio now      Relevant Orders   Lipid Profile   Post-COVID syndrome    Her chronic fatigue could be due to post COVID syndrome Dizziness unclear if related to COVID infection, but more likely due to vertigo Advised to maintain adequate hydration      Vertigo - Primary    Dizziness likely due to vertigo, in the setting of recent URTI Offered meclizine, but she prefers to avoid medication Vestibular exercises material provided Avoid sudden positional changes      Other Visit Diagnoses     Chronic anticoagulation       Relevant Orders   CBC   Encounter for immunization       Relevant Orders   Flu Vaccine Trivalent High Dose (Fluad) (Completed)       No orders of the defined types were placed in this encounter.   Follow-up: Return in about 2 months (around 12/16/2023).    Anabel Halon, MD

## 2023-10-15 NOTE — Telephone Encounter (Signed)
Last seen on 09/23/23 per note " Can take rare Percocet as needed for days when there is more intense pain." Follow up scheduled 12/15/22 Last filled on 09/11/23 #30 tablets (30 day supply) Rx pending to be signed

## 2023-10-16 ENCOUNTER — Ambulatory Visit: Payer: Medicare Other | Attending: Cardiology | Admitting: *Deleted

## 2023-10-16 DIAGNOSIS — Z5181 Encounter for therapeutic drug level monitoring: Secondary | ICD-10-CM | POA: Insufficient documentation

## 2023-10-16 DIAGNOSIS — I480 Paroxysmal atrial fibrillation: Secondary | ICD-10-CM | POA: Insufficient documentation

## 2023-10-16 LAB — POCT INR: INR: 1.8 — AB (ref 2.0–3.0)

## 2023-10-16 NOTE — Patient Instructions (Signed)
Increase warfarin to 1 1/2 tablets daily except 1 tablet on Sundays Recheck INR in 5 wks.

## 2023-10-20 ENCOUNTER — Encounter: Payer: Self-pay | Admitting: Cardiology

## 2023-10-21 ENCOUNTER — Other Ambulatory Visit: Payer: Self-pay

## 2023-10-21 ENCOUNTER — Other Ambulatory Visit: Payer: Self-pay | Admitting: Pharmacist

## 2023-10-21 ENCOUNTER — Telehealth: Payer: Self-pay

## 2023-10-21 NOTE — Telephone Encounter (Signed)
Auth Submission: NO AUTH NEEDED Site of care: Site of care: [facility] Payer: medicare a/b, cigna supp Medication & CPT/J Code(s) submitted: Leqvio (Inclisiran) [phone] Route of submission (phone, fax, portal): phone Phone # Fax # Auth type: Buy/Bill HB Units/visits requested: q6 months, 284mg  Reference number:  Approval from: 10/21/23 to 11/12/23

## 2023-10-23 ENCOUNTER — Encounter: Payer: Self-pay | Admitting: Internal Medicine

## 2023-10-30 DIAGNOSIS — N951 Menopausal and female climacteric states: Secondary | ICD-10-CM | POA: Diagnosis not present

## 2023-10-30 DIAGNOSIS — E039 Hypothyroidism, unspecified: Secondary | ICD-10-CM | POA: Diagnosis not present

## 2023-10-30 DIAGNOSIS — Z7901 Long term (current) use of anticoagulants: Secondary | ICD-10-CM | POA: Diagnosis not present

## 2023-10-30 DIAGNOSIS — E7801 Familial hypercholesterolemia: Secondary | ICD-10-CM | POA: Diagnosis not present

## 2023-10-30 LAB — POCT INR: INR: 2.2 (ref 2.0–3.0)

## 2023-10-31 ENCOUNTER — Ambulatory Visit (INDEPENDENT_AMBULATORY_CARE_PROVIDER_SITE_OTHER): Payer: Medicare Other | Admitting: *Deleted

## 2023-10-31 DIAGNOSIS — I480 Paroxysmal atrial fibrillation: Secondary | ICD-10-CM

## 2023-10-31 DIAGNOSIS — Z5181 Encounter for therapeutic drug level monitoring: Secondary | ICD-10-CM

## 2023-10-31 LAB — TSH+FREE T4
Free T4: 0.86 ng/dL (ref 0.82–1.77)
TSH: 3.24 u[IU]/mL (ref 0.450–4.500)

## 2023-10-31 LAB — CMP14+EGFR
ALT: 14 [IU]/L (ref 0–32)
AST: 18 [IU]/L (ref 0–40)
Albumin: 3.9 g/dL (ref 3.8–4.8)
Alkaline Phosphatase: 55 [IU]/L (ref 44–121)
BUN/Creatinine Ratio: 24 (ref 12–28)
BUN: 18 mg/dL (ref 8–27)
Bilirubin Total: 0.2 mg/dL (ref 0.0–1.2)
CO2: 22 mmol/L (ref 20–29)
Calcium: 9.5 mg/dL (ref 8.7–10.3)
Chloride: 105 mmol/L (ref 96–106)
Creatinine, Ser: 0.75 mg/dL (ref 0.57–1.00)
Globulin, Total: 2.1 g/dL (ref 1.5–4.5)
Glucose: 98 mg/dL (ref 70–99)
Potassium: 4.5 mmol/L (ref 3.5–5.2)
Sodium: 140 mmol/L (ref 134–144)
Total Protein: 6 g/dL (ref 6.0–8.5)
eGFR: 82 mL/min/{1.73_m2} (ref >59)

## 2023-10-31 LAB — CBC
Hematocrit: 37.1 % (ref 34.0–46.6)
Hemoglobin: 12 g/dL (ref 11.1–15.9)
MCH: 28.3 pg (ref 26.6–33.0)
MCHC: 32.3 g/dL (ref 31.5–35.7)
MCV: 88 fL (ref 79–97)
Platelets: 183 10*3/uL (ref 150–450)
RBC: 4.24 x10E6/uL (ref 3.77–5.28)
RDW: 12.7 % (ref 11.7–15.4)
WBC: 5.1 10*3/uL (ref 3.4–10.8)

## 2023-10-31 LAB — LIPID PANEL
Chol/HDL Ratio: 5 {ratio} — ABNORMAL HIGH (ref 0.0–4.4)
Cholesterol, Total: 299 mg/dL — ABNORMAL HIGH (ref 100–199)
HDL: 60 mg/dL (ref >39)
LDL Chol Calc (NIH): 206 mg/dL — ABNORMAL HIGH (ref 0–99)
Triglycerides: 174 mg/dL — ABNORMAL HIGH (ref 0–149)
VLDL Cholesterol Cal: 33 mg/dL (ref 5–40)

## 2023-10-31 NOTE — Patient Instructions (Addendum)
Continue warfarin 1 1/2 tablets daily except 1 tablet on Sundays Recheck INR in 5 wks.   Pt notified via My Chart

## 2023-11-17 ENCOUNTER — Encounter: Payer: Self-pay | Admitting: Neurology

## 2023-11-18 ENCOUNTER — Other Ambulatory Visit: Payer: Self-pay

## 2023-11-18 MED ORDER — OXYCODONE-ACETAMINOPHEN 5-325 MG PO TABS: ORAL_TABLET | ORAL | 0 refills | Status: DC

## 2023-11-18 NOTE — Telephone Encounter (Signed)
 Pt Last Seen 09/23/2023 Upcoming Appointment 12/16/2023  Oxycodone 5-325mg  last filled 10/15/2023 Escript 11/18/2023

## 2023-11-18 NOTE — Telephone Encounter (Unsigned)
 Copied from CRM [id number]. Topic: Appointments - Scheduling Inquiry for Clinic >> Nov 18, 2023 11:13 AM Adelina Mings wrote: Reason for CRM: need call back to get scheduled for coumadin clinic

## 2023-11-20 ENCOUNTER — Ambulatory Visit: Payer: Medicare Other | Attending: Cardiology | Admitting: *Deleted

## 2023-11-20 DIAGNOSIS — R6882 Decreased libido: Secondary | ICD-10-CM | POA: Diagnosis not present

## 2023-11-20 DIAGNOSIS — Z6825 Body mass index (BMI) 25.0-25.9, adult: Secondary | ICD-10-CM | POA: Diagnosis not present

## 2023-11-20 DIAGNOSIS — N951 Menopausal and female climacteric states: Secondary | ICD-10-CM | POA: Diagnosis not present

## 2023-11-20 DIAGNOSIS — I480 Paroxysmal atrial fibrillation: Secondary | ICD-10-CM | POA: Insufficient documentation

## 2023-11-20 DIAGNOSIS — Z5181 Encounter for therapeutic drug level monitoring: Secondary | ICD-10-CM | POA: Insufficient documentation

## 2023-11-20 DIAGNOSIS — R5383 Other fatigue: Secondary | ICD-10-CM | POA: Diagnosis not present

## 2023-11-20 DIAGNOSIS — F3341 Major depressive disorder, recurrent, in partial remission: Secondary | ICD-10-CM | POA: Diagnosis not present

## 2023-11-20 LAB — POCT INR: INR: 2.7 (ref 2.0–3.0)

## 2023-11-20 NOTE — Patient Instructions (Signed)
 Continue warfarin 1 1/2 tablets daily except 1 tablet on Sundays Recheck INR in 6 wks.

## 2023-11-21 ENCOUNTER — Encounter: Payer: Self-pay | Admitting: Internal Medicine

## 2023-11-21 ENCOUNTER — Telehealth (INDEPENDENT_AMBULATORY_CARE_PROVIDER_SITE_OTHER): Payer: Medicare Other | Admitting: Internal Medicine

## 2023-11-21 DIAGNOSIS — H109 Unspecified conjunctivitis: Secondary | ICD-10-CM

## 2023-11-21 MED ORDER — OFLOXACIN 0.3 % OP SOLN
1.0000 [drp] | Freq: Four times a day (QID) | OPHTHALMIC | 0 refills | Status: DC
Start: 2023-11-21 — End: 2024-03-16

## 2023-11-21 NOTE — Progress Notes (Signed)
 Virtual Visit via Video Note   Because of Heather [name]'s co-morbid illnesses, she is at least at moderate risk for complications without adequate follow up.  This format is felt to be most appropriate for this patient at this time.  All issues noted in this document were discussed and addressed.  A limited physical exam was performed with this format.      Evaluation Performed:  Follow-up visit  Date:  11/21/2023   ID:  Heather [name], DOB May 27, 1946, MRN [id number]  Patient Location: Home Provider Location: Office/Clinic  Participants: Patient Location of Patient: Home Location of Provider: Telehealth Consent was obtain for visit to be over via telehealth. I verified that I am speaking with the correct person using two identifiers.  PCP:  Tobie Suzzane POUR, MD   Chief Complaint: Eye discharge  History of Present Illness:    Heather [name] is a 78 y.o. female who has a video visit for c/o right-sided eye mucoid discharge and sticky feeling in the morning for the last 5 days. She has had clear discharge from left eye as well. Denies visual disturbance.  Denies eye pain currently.  Does not report any fever or chills.  She is also concerned about recent bakery product recall due to possible Listeria contamination.  She denies any nausea, vomiting or diarrhea currently. Last intake of recalled food was about 2 weeks ago.  The patient does not have symptoms concerning for COVID-19 infection (fever, chills, cough, or new shortness of breath).   Past Medical, Surgical, Social History, Allergies, and Medications have been Reviewed.  Past Medical History:  Diagnosis Date   Adrenal adenoma    Allergy    Anemia    Anxiety    CAD (coronary artery disease)    a. 50-60% mid LAD at cardiac catheterization 2014 ([place] ) b. non-obstructive CAD by cath in 09/2018   Cataract    Depression    DJD (degenerative joint disease) of cervical spine 09/12/2016   Dysrhythmia    Essential  hypertension 10/15/2023   H. pylori infection 07/19/2017   History of cardiomyopathy    History of COVID-19 12/10/2019   History of tobacco use    Hyperlipidemia    Hypothyroidism 08/24/2022   Mid back pain 02/08/2017   Neuromuscular disorder (HCC)    PAF (paroxysmal atrial fibrillation) (HCC)    Diagnosis July 2017 - spontaneously resolved   Smell disturbance 06/13/2021   Statin intolerance    Past Surgical History:  Procedure Laterality Date   ABDOMINAL HYSTERECTOMY  1980   endometriosis   BIOPSY  07/18/2021   Procedure: BIOPSY;  Surgeon: Eartha Angelia Sieving, MD;  [facility];  Service: Gastroenterology;;   BREAST BIOPSY Right 07/29/2023   MM RT BREAST BX W LOC DEV 1ST LESION IMAGE BX SPEC STEREO GUIDE 07/29/2023 [facility]   CATARACT EXTRACTION Left 11/2018   COLONOSCOPY WITH PROPOFOL  N/A 06/27/2022   Procedure: COLONOSCOPY WITH PROPOFOL ;  Surgeon: Eartha Angelia Sieving, MD;  [facility];  Service: Gastroenterology;  Laterality: N/A;  11:15   CORONARY PRESSURE/FFR STUDY N/A 06/01/2021   Procedure: INTRAVASCULAR PRESSURE WIRE/FFR STUDY;  Surgeon: Claudene Victory ORN, MD;  [facility]: [facility];  Service: Cardiovascular;  Laterality: N/A;   ESOPHAGOGASTRODUODENOSCOPY (EGD) WITH PROPOFOL  N/A 07/18/2021   Procedure: ESOPHAGOGASTRODUODENOSCOPY (EGD) WITH PROPOFOL ;  Surgeon: Eartha Angelia Sieving, MD;  [facility];  Service: Gastroenterology;  Laterality: N/A;  12:00   EYE SURGERY  cataracts  2021/2022  HEMOSTASIS CLIP PLACEMENT  06/27/2022   Procedure: HEMOSTASIS CLIP PLACEMENT;  Surgeon: Eartha Angelia Sieving, MD;  [facility];  Service: Gastroenterology;;   LEFT HEART CATH AND CORONARY ANGIOGRAPHY N/A 09/25/2018   Procedure: LEFT HEART CATH AND CORONARY ANGIOGRAPHY;  Surgeon: Claudene Victory ORN, MD;  Location[facility] INVASIVE CV LAB;  Service: Cardiovascular;  Laterality: N/A;   LEFT HEART CATH AND CORONARY  ANGIOGRAPHY N/A 06/01/2021   Procedure: LEFT HEART CATH AND CORONARY ANGIOGRAPHY;  Surgeon: Claudene Victory ORN, MD;  [facility];  Service: Cardiovascular;  Laterality: N/A;   POLYPECTOMY  06/27/2022   Procedure: POLYPECTOMY;  Surgeon: Eartha Angelia Sieving, MD;  [facility];  Service: Gastroenterology;;   HARLEY DILATION  07/18/2021   Procedure: HARLEY DILATION;  Surgeon: Eartha Angelia, Sieving, MD;  [facility];  Service: Gastroenterology;;   SUBMUCOSAL LIFTING INJECTION  06/27/2022   Procedure: SUBMUCOSAL LIFTING INJECTION;  Surgeon: Eartha Angelia Sieving, MD;  [facility];  Service: Gastroenterology;;   SUBMUCOSAL TATTOO INJECTION  06/27/2022   Procedure: SUBMUCOSAL TATTOO INJECTION;  Surgeon: Eartha Angelia, Sieving, MD;  [facility]: [facility];  Service: Gastroenterology;;   TONSILLECTOMY  1954     No outpatient medications have been marked as taking for the 11/21/23 encounter (Appointment) with Tobie Suzzane POUR, MD.     Allergies:   Statins, Other, and Sulfa antibiotics   ROS:   Please see the history of present illness. All other systems reviewed and are negative.   Labs/Other Tests and Data Reviewed:    Recent Labs: 10/30/2023: ALT 14; BUN 18; Creatinine, Ser 0.75; Hemoglobin 12.0; Platelets 183; Potassium 4.5; Sodium 140; TSH 3.240   Recent Lipid Panel Lab Results  Component Value Date/Time   CHOL 299 (H) 10/30/2023 09:43 AM   TRIG 174 (H) 10/30/2023 09:43 AM   HDL 60 10/30/2023 09:43 AM   CHOLHDL 5.0 (H) 10/30/2023 09:43 AM   CHOLHDL 4.6 04/05/2021 02:14 PM   LDLCALC 206 (H) 10/30/2023 09:43 AM   LDLCALC 169 (H) 04/05/2021 02:14 PM    Wt Readings from Last 3 Encounters:  03/05/23 158 lb (71.7 kg)  11/26/22 160 lb (72.6 kg)  10/08/22 158 lb (71.7 kg)     Objective:    Vital Signs:  LMP 07/08/1979 (Approximate)    VITAL SIGNS:  reviewed GEN:  no acute distress EYES:  sclerae anicteric, EOMI -  Extraocular Movements Intact.  Conjunctival erythema and mucoid discharge from right eye. RESPIRATORY:  normal respiratory effort, symmetric expansion NEURO:  alert and oriented x 3, no obvious focal deficit PSYCH:  normal affect  ASSESSMENT & PLAN:    Acute bacterial conjunctivitis Considering unilateral mucoid discharge and conjunctival erythema, started ofloxacin  eyedrops Warm compresses PRN for crusting  As she is asymptomatic currently in terms of GI symptoms or fever/chills, no testing for Listeria indicated currently.  I discussed the assessment and treatment plan with the patient. The patient was provided an opportunity to ask questions, and all were answered. The patient agreed with the plan and demonstrated an understanding of the instructions.   The patient was advised to call back or seek an in-person evaluation if the symptoms worsen or if the condition fails to improve as anticipated.  The above assessment and management plan was discussed with the patient. The patient verbalized understanding of and has agreed to the management plan.   Medication Adjustments/Labs and Tests Ordered: Current medicines are reviewed at length with the patient today.  Concerns  regarding medicines are outlined above.   Tests Ordered: No orders of the defined types were placed in this encounter.   Medication Changes: No orders of the defined types were placed in this encounter.    Note: This dictation was prepared with Dragon dictation along with smaller phrase technology. Similar sounding words can be transcribed inadequately or may not be corrected upon review. Any transcriptional errors that result from this process are unintentional.      Disposition:  Follow up  Signed, Suzzane MARLA Blanch, MD  11/21/2023 9:18 AM     Heather [name]

## 2023-11-26 ENCOUNTER — Encounter (INDEPENDENT_AMBULATORY_CARE_PROVIDER_SITE_OTHER): Payer: Medicare Other | Admitting: Ophthalmology

## 2023-12-11 ENCOUNTER — Encounter (INDEPENDENT_AMBULATORY_CARE_PROVIDER_SITE_OTHER): Payer: Medicare Other | Admitting: Ophthalmology

## 2023-12-11 DIAGNOSIS — H43813 Vitreous degeneration, bilateral: Secondary | ICD-10-CM | POA: Diagnosis not present

## 2023-12-11 DIAGNOSIS — H353132 Nonexudative age-related macular degeneration, bilateral, intermediate dry stage: Secondary | ICD-10-CM | POA: Diagnosis not present

## 2023-12-15 ENCOUNTER — Encounter: Payer: Self-pay | Admitting: Internal Medicine

## 2023-12-16 ENCOUNTER — Ambulatory Visit (INDEPENDENT_AMBULATORY_CARE_PROVIDER_SITE_OTHER): Payer: Medicare Other | Admitting: Neurology

## 2023-12-16 ENCOUNTER — Other Ambulatory Visit: Payer: Self-pay | Admitting: Internal Medicine

## 2023-12-16 VITALS — BP 117/78 | HR 76

## 2023-12-16 DIAGNOSIS — E039 Hypothyroidism, unspecified: Secondary | ICD-10-CM

## 2023-12-16 DIAGNOSIS — G501 Atypical facial pain: Secondary | ICD-10-CM | POA: Diagnosis not present

## 2023-12-16 DIAGNOSIS — R419 Unspecified symptoms and signs involving cognitive functions and awareness: Secondary | ICD-10-CM

## 2023-12-16 DIAGNOSIS — M62838 Other muscle spasm: Secondary | ICD-10-CM

## 2023-12-16 DIAGNOSIS — E7801 Familial hypercholesterolemia: Secondary | ICD-10-CM

## 2023-12-16 DIAGNOSIS — M7918 Myalgia, other site: Secondary | ICD-10-CM

## 2023-12-16 MED ORDER — LEVOTHYROXINE SODIUM 100 MCG PO TABS
100.0000 ug | ORAL_TABLET | Freq: Every day | ORAL | 0 refills | Status: DC
Start: 2023-12-16 — End: 2024-02-03

## 2023-12-16 MED ORDER — ONABOTULINUMTOXINA 100 UNITS IJ SOLR
100.0000 [IU] | Freq: Once | INTRAMUSCULAR | Status: AC
Start: 2023-12-16 — End: 2023-12-16
  Administered 2023-12-16: 100 [IU] via INTRAMUSCULAR

## 2023-12-16 NOTE — Progress Notes (Signed)
Botox- 100 units  Lot: [id number] Expiration: 02/2025 NDC: 4782-9562-13  Bacteriostatic 0.9% Sodium Chloride- 4mL total Lot: [id number] Expiration: 09/12/2024 NDC: 4696-2952-84 Dx: X32.44 B/B

## 2023-12-16 NOTE — Progress Notes (Signed)
[facility]  PATIENT: Heather [name] DOB: May 08, 1946  REFERRING DOCTOR OR PCP:  Rica Mast SOURCE: patient  _________________________________   HISTORICAL  CHIEF COMPLAINT:  Chief Complaint  Patient presents with   Procedure    Rm 11 alone Pt is well     HISTORY OF PRESENT ILLNESS:  Heather [name] is a 78 y.o. woman who has had chronic right-sided mid back pain since 2013.      Update 12/16/2023 Her myofascial pain in the right trunk has done well since the last Botox injection.  The Botox has markedly reduced the thoracic pain.  Pain fluctuates but is much better since Botox.    When she has more of the myofascial pain pain, she will take half of a Percocet.  If she notes more spasticity she will take half of Valium.  Prescriptions have lasted multiple months.   THe Botox has greatly reduced tha pain for 2.5 to 3 months  She has a right jaw angle pain that is aching and fairly constant.   She has seen her dentist and no diagnosis was made.   She does chew a lot of gum.  Cognition is stable.   MRI of the brain showed mild atrophy.   There is no lobar predominant pattern.    She sleeps well   She never combines with a percocet.    She is on coumadin for atrial fibrillation.  If she notes more palpitations, she takes a valium (rare).     IMAGING:  MRI results from 10/14/2013: The MRI of the brain showed age related atrophy and minimal chronic microvascular ischemic change. MRI of the cervical spine showed multilevel mild degenerative changes with left paramedian disc herniation at C3-C4 and left disc protrusion at C4-C5 and C5-C6 and midline disc herniation at C6-C7. There was no report of nerve root compression. MRI of the thoracic spine showed disc desiccation but no herniation or protrusions. MRI of the lumbar spine showed disc bulges at T12-L1 and L2-L3 and disc bulge with facet hypertrophy at L3-L4 and disc bulge with right foraminal annular tear at  L4-L5.  MRI brain 7/4/2-22 was personally reviewed.  It shows mild generalized atrophy.  No acute findings.     REVIEW OF SYSTEMS: Constitutional: No fevers, chills, sweats, or change in appetite Eyes: No visual changes, double vision, eye pain Ear, nose and throat: No hearing loss, ear pain, nasal congestion, sore throat Cardiovascular: No chest pain, palpitations.   She has atrial fibrillation and is on warfarin Respiratory:  No shortness of breath at rest or with exertion.   No wheezes GastrointestinaI: No nausea, vomiting, diarrhea, abdominal pain, fecal incontinence Genitourinary:  No dysuria, urinary retention or frequency.  No nocturia. Musculoskeletal:  No neck pain.  She has back pain/thoracic pain as above Integumentary: No rash, pruritus, skin lesions Neurological: as above Psychiatric: No depression at this time.  No anxiety Endocrine: No palpitations, diaphoresis, change in appetite, change in weigh or increased thirst Hematologic/Lymphatic:  No anemia, purpura, petechiae. Allergic/Immunologic: No itchy/runny eyes, nasal congestion, recent allergic reactions, rashes  ALLERGIES: Allergies  Allergen Reactions   Statins Other (See Comments)    Muscle weakness   Other     EKG pads causes blisters    Sulfa Antibiotics Rash    HOME MEDICATIONS:  Current Outpatient Medications:    buPROPion (WELLBUTRIN SR) 150 MG 12 hr tablet, TAKE ONE TABLET BY MOUTH TWICE A DAY, Disp: 180 tablet, Rfl: 1   Cholecalciferol (VITAMIN D3) 125  MCG (5000 UT) TABS, Take 5,000 Units by mouth daily at 12 noon., Disp: , Rfl:    Coenzyme Q10 100 MG capsule, Take 100 mg by mouth daily at 12 noon., Disp: , Rfl:    diazepam (VALIUM) 5 MG tablet, Take 1 tablet (5 mg total) by mouth daily as needed for anxiety or muscle spasms., Disp: 30 tablet, Rfl: 0   docusate sodium (COLACE) 100 MG capsule, Take 100 mg by mouth 2 (two) times daily. , Disp: , Rfl:    estradiol (ESTRACE) 1 MG tablet, Take 1 mg by mouth  daily., Disp: , Rfl:    inclisiran (LEQVIO) 284 MG/1.5ML SOSY injection, Inject 284 mg into the skin once., Disp: , Rfl:    levothyroxine (SYNTHROID) 100 MCG tablet, Take 1 tablet (100 mcg total) by mouth daily., Disp: 90 tablet, Rfl: 0   mupirocin ointment (BACTROBAN) 2 %, Apply 1 Application topically 2 (two) times daily. (Patient not taking: Reported on 09/23/2023), Disp: 30 g, Rfl: 0   nitroGLYCERIN (NITROSTAT) 0.4 MG SL tablet, Place 1 tablet under the tongue every 5 minutes as needed for chest pain. (Patient not taking: Reported on 09/23/2023), Disp: 25 tablet, Rfl: 1   ofloxacin (OCUFLOX) 0.3 % ophthalmic solution, Place 1 drop into the right eye 4 (four) times daily., Disp: 5 mL, Rfl: 0   oxyCODONE-acetaminophen (PERCOCET/ROXICET) 5-325 MG tablet, TAKE 1 TABLET DAILY AS NEEDED FOR SEVERE PAIN., Disp: 30 tablet, Rfl: 0   progesterone (PROMETRIUM) 200 MG capsule, Take 1 capsule (200 mg total) by mouth daily., Disp: 90 capsule, Rfl: 1   traMADol (ULTRAM) 50 MG tablet, Take 1 tablet (50 mg total) by mouth every 12 (twelve) hours as needed. (Patient not taking: Reported on 12/16/2023), Disp: 10 tablet, Rfl: 0   warfarin (COUMADIN) 5 MG tablet, Take 1 tablet to 1 1/2 tablets daily as directed by coumadin clinic, Disp: 135 tablet, Rfl: 1  PAST MEDICAL HISTORY: Past Medical History:  Diagnosis Date   Adrenal adenoma    Allergy    Anemia    Anxiety    CAD (coronary artery disease)    a. 50-60% mid LAD at cardiac catheterization 2014 [facility]) b. non-obstructive CAD by cath in 09/2018   Cataract    Depression    DJD (degenerative joint disease) of cervical spine 09/12/2016   Dysrhythmia    Essential hypertension 10/15/2023   H. pylori infection 07/19/2017   History of cardiomyopathy    History of COVID-19 12/10/2019   History of tobacco use    Hyperlipidemia    Hypothyroidism 08/24/2022   Mid back pain 02/08/2017   Neuromuscular disorder (HCC)    PAF (paroxysmal atrial fibrillation)  (HCC)    Diagnosis July 2017 - spontaneously resolved   Smell disturbance 06/13/2021   Statin intolerance     PAST SURGICAL HISTORY: Past Surgical History:  Procedure Laterality Date   ABDOMINAL HYSTERECTOMY  1980   endometriosis   BIOPSY  07/18/2021   Procedure: BIOPSY;  Surgeon: Dolores Frame, MD;  [facility]: [facility];  Service: Gastroenterology;;   BREAST BIOPSY Right 07/29/2023   MM RT BREAST BX W LOC DEV 1ST LESION IMAGE BX SPEC STEREO GUIDE 07/29/2023 [facility]   CATARACT EXTRACTION Left 11/2018   COLONOSCOPY WITH PROPOFOL N/A 06/27/2022   Procedure: COLONOSCOPY WITH PROPOFOL;  Surgeon: Dolores Frame, MD;  [facility]: [facility];  Service: Gastroenterology;  Laterality: N/A;  11:15   CORONARY PRESSURE/FFR STUDY N/A 06/01/2021   Procedure: INTRAVASCULAR PRESSURE WIRE/FFR STUDY;  Surgeon: Lyn Records, MD;  Location: [facility] INVASIVE CV LAB;  Service: Cardiovascular;  Laterality: N/A;   ESOPHAGOGASTRODUODENOSCOPY (EGD) WITH PROPOFOL N/A 07/18/2021   Procedure: ESOPHAGOGASTRODUODENOSCOPY (EGD) WITH PROPOFOL;  Surgeon: Dolores Frame, MD;  [facility];  Service: Gastroenterology;  Laterality: N/A;  12:00   EYE SURGERY  cataracts  2021/2022   HEMOSTASIS CLIP PLACEMENT  06/27/2022   Procedure: HEMOSTASIS CLIP PLACEMENT;  Surgeon: Dolores Frame, MD;  [facility]: [facility];  Service: Gastroenterology;;   LEFT HEART CATH AND CORONARY ANGIOGRAPHY N/A 09/25/2018   Procedure: LEFT HEART CATH AND CORONARY ANGIOGRAPHY;  Surgeon: Lyn Records, MD;  Location: [facility];  Service: Cardiovascular;  Laterality: N/A;   LEFT HEART CATH AND CORONARY ANGIOGRAPHY N/A 06/01/2021   Procedure: LEFT HEART CATH AND CORONARY ANGIOGRAPHY;  Surgeon: Lyn Records, MD;  Location: [facility]VAS[facility] CV LAB;  Service: Cardiovascular;  Laterality: N/A;   POLYPECTOMY  06/27/2022   Procedure: POLYPECTOMY;  Surgeon: Dolores Frame,  MD;  [facility];  Service: Gastroenterology;;   Gaspar Bidding DILATION  07/18/2021   Procedure: Gaspar Bidding DILATION;  Surgeon: Marguerita Merles, Reuel Boom, MD;  Location: [facility];  Service: Gastroenterology;;   SUBMUCOSAL LIFTING INJECTION  06/27/2022   Procedure: SUBMUCOSAL LIFTING INJECTION;  Surgeon: Marguerita Merles, Reuel Boom, MD;  [facility]: [facility];  Service: Gastroenterology;;   SUBMUCOSAL TATTOO INJECTION  06/27/2022   Procedure: SUBMUCOSAL TATTOO INJECTION;  Surgeon: Marguerita Merles, Reuel Boom, MD;  Location: [facility];  Service: Gastroenterology;;   TONSILLECTOMY  1954    FAMILY HISTORY: Family History  Problem Relation Age of Onset   COPD Mother    Hearing loss Mother    Stroke Mother    Heart disease Mother        chf, died at 67   Vision loss Mother    Varicose Veins Mother    Arthritis Father    Hypertension Father    Heart disease Father        cad, pvd, died at 59   Vision loss Father    Heart disease Sister        heart valve   Arthritis Sister    Depression Sister    Hyperlipidemia Brother    Hypertension Brother    Cancer - Prostate Brother     SOCIAL HISTORY:  Social History   Socioeconomic History   Marital status: Married    Spouse name: Al   Number of children: 0   Years of education: 18   Highest education level: Master's degree (e.g., MA, MS, MEng, MEd, MSW, MBA)  Occupational History   Occupation: Clinical research associate    Comment: from home  Tobacco Use   Smoking status: Former    Current packs/day: 0.00    Average packs/day: 1 pack/day for 42.8 years (42.8 ttl pk-yrs)    Types: Cigarettes    Start date: 11/12/1965    Quit date: 09/06/2008    Years since quitting: 15.2   Smokeless tobacco: Never  Vaping Use   Vaping status: Never Used  Substance and Sexual Activity   Alcohol use: Yes    Alcohol/week: 2.0 standard drinks of alcohol    Types: 1 Glasses of wine, 1 Shots of liquor per week    Comment: occasional   Drug use: Yes     Types: Hydrocodone, Marijuana    Comment: prescribed Percocet for myofacial pain syndrome   Sexual activity: Yes    Birth control/protection: Post-menopausal  Other Topics  Concern   Not on file  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   Lives with husband AL   No children   Healthy diet and lifestyle   Social Drivers of Health   Financial Resource Strain: Low Risk  (10/11/2023)   Overall Financial Resource Strain (CARDIA)    Difficulty of Paying Living Expenses: Not very hard  Food Insecurity: No Food Insecurity (10/11/2023)   Hunger Vital Sign    Worried About Running Out of Food in the Last Year: Never true    Ran Out of Food in the Last Year: Never true  Transportation Needs: No Transportation Needs (10/11/2023)   PRAPARE - Administrator, Civil Service (Medical): No    Lack of Transportation (Non-Medical): No  Physical Activity: Sufficiently Active (10/11/2023)   Exercise Vital Sign    Days of Exercise per Week: 3 days    Minutes of Exercise per Session: 120 min  Stress: No Stress Concern Present (10/11/2023)   [employer] of Occupational Health - Occupational Stress Questionnaire    Feeling of Stress : Not at all  Social Connections: Moderately Isolated (10/11/2023)   Social Connection and Isolation Panel [NHANES]    Frequency of Communication with Friends and Family: Twice a week    Frequency of Social Gatherings with Friends and Family: Once a week    Attends Religious Services: Never    Database administrator or Organizations: No    Attends Engineer, structural: Not on file    Marital Status: Married  Catering manager Violence: Not At Risk (09/13/2021)   Humiliation, Afraid, Rape, and Kick questionnaire    Fear of Current or Ex-Partner: No    Emotionally Abused: No    Physically Abused: No    Sexually Abused: No     PHYSICAL EXAM   General: The patient is well-developed and well-nourished and in no acute distress.  Head is  normocephalic and atraumatic.   Musculoskeletal : There is tenderness in the mid to lower thoracic intercoastal muscles from T6-T9 on the right .  The neck was nontender today.  Neurologic Exam  Mental status: The patient is alert and oriented x 3 at the time of the examination.   Focus and attention seem normal.  Speech appeared normal.  No aphasia was noted during the conversation and she had no problems, with the right words Cranial nerves: Extraocular muscles are intact.  Facial strength is normal.   . No obvious hearing deficits are noted.  Motor: She has normal muscle tone, muscle bulk and muscle strength in the arms or legs.  gait and station: Gait and station are normal.  The Romberg is negative    ASSESSMENT AND PLAN  1. Myofascial pain syndrome   2. Cognitive complaints   3. Muscle spasm   4. Atypical facial pain       1.  Inject 100 units of Botox using 30-gauge needle split into tender points below the T6-T9 ribs into the intercostal muscles near the midclavicular line.  There were no complications and she tolerated the injections well.   2.   Cognitive concerns have improved.  Neurocognitive testing was normal.  I do not think that she has a neurodegenerative process.      3.  Stay active and exercise as tolerated.  Can take rare Percocet as needed for days when there is more intense pain.  If severe spasms take Valium  4.   Etiology of jaw pain uncertain.  Not typical for trigeminal neuralgia She will return in 3-4 months for next Botox or sooner if  new or worsening neurologic symptoms.  Heather [name] A. Epimenio Foot, MD, PhD 12/16/2023, 2:05 PM Certified in Neurology, Clinical Neurophysiology, Sleep Medicine, Pain Medicine and Neuroimaging  [facility] [address] [place], [place] [address] [phone]

## 2023-12-25 ENCOUNTER — Ambulatory Visit: Payer: Medicare Other

## 2023-12-29 ENCOUNTER — Other Ambulatory Visit: Payer: Self-pay | Admitting: Neurology

## 2024-01-01 ENCOUNTER — Ambulatory Visit: Payer: Medicare Other

## 2024-01-01 MED ORDER — OXYCODONE-ACETAMINOPHEN 5-325 MG PO TABS: ORAL_TABLET | ORAL | 0 refills | Status: DC

## 2024-01-01 NOTE — Telephone Encounter (Signed)
Last seen 12/16/23 and next f/u 03/16/24. Last refilled 11/18/23 #30.

## 2024-01-13 ENCOUNTER — Ambulatory Visit: Payer: Medicare Other | Attending: Cardiology | Admitting: *Deleted

## 2024-01-13 DIAGNOSIS — Z5181 Encounter for therapeutic drug level monitoring: Secondary | ICD-10-CM | POA: Insufficient documentation

## 2024-01-13 DIAGNOSIS — I480 Paroxysmal atrial fibrillation: Secondary | ICD-10-CM | POA: Insufficient documentation

## 2024-01-13 LAB — POCT INR: INR: 2.7 (ref 2.0–3.0)

## 2024-01-13 MED ORDER — WARFARIN SODIUM 5 MG PO TABS
ORAL_TABLET | ORAL | 1 refills | Status: DC
Start: 2024-01-13 — End: 2024-07-14

## 2024-01-13 NOTE — Patient Instructions (Signed)
 Continue warfarin 1 1/2 tablets daily except 1 tablet on Sundays Recheck INR in 6 wks.

## 2024-01-31 ENCOUNTER — Other Ambulatory Visit: Payer: Self-pay | Admitting: Neurology

## 2024-02-03 ENCOUNTER — Ambulatory Visit: Payer: Medicare Other | Attending: Cardiology | Admitting: Cardiology

## 2024-02-03 ENCOUNTER — Encounter: Payer: Self-pay | Admitting: Cardiology

## 2024-02-03 VITALS — BP 142/72 | HR 61 | Ht 64.0 in | Wt 150.0 lb

## 2024-02-03 DIAGNOSIS — E7849 Other hyperlipidemia: Secondary | ICD-10-CM | POA: Diagnosis not present

## 2024-02-03 DIAGNOSIS — R030 Elevated blood-pressure reading, without diagnosis of hypertension: Secondary | ICD-10-CM

## 2024-02-03 DIAGNOSIS — I480 Paroxysmal atrial fibrillation: Secondary | ICD-10-CM

## 2024-02-03 MED ORDER — DIAZEPAM 5 MG PO TABS: 5.0000 mg | ORAL_TABLET | Freq: Every day | ORAL | 3 refills | Status: DC | PRN

## 2024-02-03 NOTE — Patient Instructions (Signed)
 Medication Instructions:  Your physician recommends that you continue on your current medications as directed. Please refer to the Current Medication list given to you today.   Labwork: None today  Testing/Procedures: None today  Follow-Up: 6 months  Any Other Special Instructions Will Be Listed Below (If Applicable).  If you need a refill on your cardiac medications before your next appointment, please call your pharmacy.

## 2024-02-03 NOTE — Telephone Encounter (Signed)
 Last seen on 12/16/23 Follow up scheduled on 03/16/24 Last filled per epic 2023 Rx pending to be signed

## 2024-02-03 NOTE — Progress Notes (Signed)
    Cardiology Office Note  Date: 02/03/2024   ID: Lakeeta Dobosz, DOB 1946-02-14, MRN [id number]  History of Present Illness: Heather [name] is a 78 y.o. female last seen in August 2024.  She is here for a routine visit.  Reports no progressive sense of palpitations or increasing shortness of breath with atrial fibrillation.  No exertional chest pain.  She continues on Coumadin with follow-up in the anticoagulation clinic.  We reviewed the remainder of her medications.  She did decide to stop Leqvio, states that she was having some dizziness associated with it.  LDL had gotten down into the 150s.  We did discuss other nonstatin options today, she is not interested in trying a PCSK9 inhibitor or other oral options such as bempedoic acid at this point.  I reviewed her ECG today which shows sinus rhythm with lead motion artifact.  Physical Exam: VS:  BP (!) 142/72   Pulse 61   Ht 5\' 4"  (1.626 m)   Wt 150 lb (68 kg)   LMP 07/08/1979 (Approximate)   SpO2 97%   BMI 25.75 kg/m , BMI Body mass index is 25.75 kg/m.  Wt Readings from Last 3 Encounters:  02/03/24 150 lb (68 kg)  03/05/23 158 lb (71.7 kg)  11/26/22 160 lb (72.6 kg)    General: Patient appears comfortable at rest. HEENT: Conjunctiva and lids normal. Neck: Supple, no elevated JVP or carotid bruits. Lungs: Clear to auscultation, nonlabored breathing at rest. Cardiac: Regular rate and rhythm, no S3 or significant systolic murmur, no pericardial rub.  ECG:  An ECG dated 11/07/2022 was personally reviewed today and demonstrated:  Sinus rhythm with nonspecific ST changes.  Labwork: 10/30/2023: ALT 14; AST 18; BUN 18; Creatinine, Ser 0.75; Hemoglobin 12.0; Platelets 183; Potassium 4.5; Sodium 140; TSH 3.240     Component Value Date/Time   CHOL 299 (H) 10/30/2023 0943   TRIG 174 (H) 10/30/2023 0943   HDL 60 10/30/2023 0943   CHOLHDL 5.0 (H) 10/30/2023 0943   CHOLHDL 4.6 04/05/2021 1414   LDLCALC 206 (H) 10/30/2023 0943    LDLCALC 169 (H) 04/05/2021 1414   Other Studies Reviewed Today:  No interval cardiac testing for review today.  Assessment and Plan:  1.  Nonobstructive CAD documented at cardiac catheterization in 2022.  She is asymptomatic.   2.  Paroxysmal atrial fibrillation with CHA2DS2-VASc score of 4.  She is on Coumadin for stroke prophylaxis.  No increasing sense of palpitations.  She is in sinus rhythm by ECG today.  No reported spontaneous bleeding problems.   3.  Primary hypertension, also whitecoat hypertension.  She does check blood pressure regularly at home.   4.  Familial hyperlipidemia.  She has a history of statin intolerance and ultimately stopped Leqvio as discussed above.  LDL up to 206 in December 4696.  We have discussed other options and at this point she prefers to hold off on further pharmacologic therapies.   5.  History of stress-induced cardiomyopathy.  LVEF 55 to 60% without regional wall motion abnormalities by follow-up echocardiogram in July 2022.  Disposition:  Follow up  6 months.  Signed, Jonelle Sidle, M.D., F.A.C.C. [facility]

## 2024-02-23 ENCOUNTER — Other Ambulatory Visit: Payer: Self-pay | Admitting: Neurology

## 2024-02-24 ENCOUNTER — Encounter

## 2024-02-25 MED ORDER — OXYCODONE-ACETAMINOPHEN 5-325 MG PO TABS: ORAL_TABLET | ORAL | 0 refills | Status: DC

## 2024-02-25 NOTE — Telephone Encounter (Signed)
 Last seen on 12/16/23 Follow up scheduled 03/16/24   Dispensed Days Supply Quantity Provider Pharmacy  OXYCODONE/ACETAMINOPHEN 5-325MG  TAB 01/01/2024 30 30 each Sater, Sherida Dimmer, MD [facility]...      Rx pending to be signed

## 2024-02-27 ENCOUNTER — Encounter

## 2024-03-09 ENCOUNTER — Ambulatory Visit: Attending: Cardiology | Admitting: *Deleted

## 2024-03-09 DIAGNOSIS — Z5181 Encounter for therapeutic drug level monitoring: Secondary | ICD-10-CM

## 2024-03-09 DIAGNOSIS — I480 Paroxysmal atrial fibrillation: Secondary | ICD-10-CM | POA: Diagnosis not present

## 2024-03-09 LAB — POCT INR: INR: 2.1 (ref 2.0–3.0)

## 2024-03-09 NOTE — Patient Instructions (Signed)
 Continue warfarin 1 1/2 tablets daily except 1 tablet on Sundays Recheck INR in 6 wks.

## 2024-03-10 ENCOUNTER — Encounter: Payer: Self-pay | Admitting: Internal Medicine

## 2024-03-10 ENCOUNTER — Other Ambulatory Visit: Payer: Self-pay | Admitting: Internal Medicine

## 2024-03-10 DIAGNOSIS — W57XXXA Bitten or stung by nonvenomous insect and other nonvenomous arthropods, initial encounter: Secondary | ICD-10-CM

## 2024-03-10 MED ORDER — DOXYCYCLINE HYCLATE 100 MG PO TABS
100.0000 mg | ORAL_TABLET | Freq: Two times a day (BID) | ORAL | 0 refills | Status: AC
Start: 2024-03-10 — End: ?

## 2024-03-16 ENCOUNTER — Ambulatory Visit (INDEPENDENT_AMBULATORY_CARE_PROVIDER_SITE_OTHER): Payer: Medicare Other | Admitting: Neurology

## 2024-03-16 VITALS — BP 147/71 | HR 56 | Ht 64.0 in

## 2024-03-16 DIAGNOSIS — R419 Unspecified symptoms and signs involving cognitive functions and awareness: Secondary | ICD-10-CM | POA: Diagnosis not present

## 2024-03-16 DIAGNOSIS — M62838 Other muscle spasm: Secondary | ICD-10-CM

## 2024-03-16 DIAGNOSIS — M549 Dorsalgia, unspecified: Secondary | ICD-10-CM | POA: Diagnosis not present

## 2024-03-16 DIAGNOSIS — M7918 Myalgia, other site: Secondary | ICD-10-CM

## 2024-03-16 MED ORDER — ONABOTULINUMTOXINA 100 UNITS IJ SOLR
100.0000 [IU] | Freq: Once | INTRAMUSCULAR | Status: AC
Start: 2024-03-16 — End: 2024-03-16
  Administered 2024-03-16: 100 [IU] via INTRAMUSCULAR

## 2024-03-16 NOTE — Progress Notes (Signed)
 [facility]  PATIENT: Heather [name] DOB: 03/30/1946  REFERRING DOCTOR OR PCP:  Elfreda Grip SOURCE: patient  _________________________________   HISTORICAL  CHIEF COMPLAINT:  Chief Complaint  Patient presents with   Botulinum Toxin Injection    Pt in 11 alone Pt takes warfarin daily     HISTORY OF PRESENT ILLNESS:  Heather [name] is a 78 y.o. woman who has had chronic right-sided mid back pain since 2013.      Update 03/16/2024 Her myofascial pain in the right trunk has done well since the last Botox  injection.  The Botox  injection has markedly reduced the thoracic pain and has made her more functional, allowing her to work.   Pain started back a couple weeks ago when she was active and today while resting.  .  When she has more of the myofascial pain pain, she will take half of a Percocet.  If she notes more spasticity she will take half of Valium .  Prescriptions have lasted multiple months.   THe Botox  has greatly reduced tha pain for 2.5 to 3 months  Cognition is stable.   MRI of the brain showed mild atrophy.   There is no lobar predominant pattern.    She sleeps well   She never combines with a percocet.    She is on coumadin  for atrial fibrillation.  If she notes more palpitations, she takes a valium  (rare).    She got a tick bite and is taking Doxycycline    IMAGING:  MRI results from 10/14/2013: The MRI of the brain showed age related atrophy and minimal chronic microvascular ischemic change. MRI of the cervical spine showed multilevel mild degenerative changes with left paramedian disc herniation at C3-C4 and left disc protrusion at C4-C5 and C5-C6 and midline disc herniation at C6-C7. There was no report of nerve root compression. MRI of the thoracic spine showed disc desiccation but no herniation or protrusions. MRI of the lumbar spine showed disc bulges at T12-L1 and L2-L3 and disc bulge with facet hypertrophy at L3-L4 and disc bulge with right  foraminal annular tear at L4-L5.  MRI brain 7/4/2-22 was personally reviewed.  It shows mild generalized atrophy.  No acute findings.     REVIEW OF SYSTEMS: Constitutional: No fevers, chills, sweats, or change in appetite Eyes: No visual changes, double vision, eye pain Ear, nose and throat: No hearing loss, ear pain, nasal congestion, sore throat Cardiovascular: No chest pain, palpitations.   She has atrial fibrillation and is on warfarin Respiratory:  No shortness of breath at rest or with exertion.   No wheezes GastrointestinaI: No nausea, vomiting, diarrhea, abdominal pain, fecal incontinence Genitourinary:  No dysuria, urinary retention or frequency.  No nocturia. Musculoskeletal:  No neck pain.  She has back pain/thoracic pain as above Integumentary: No rash, pruritus, skin lesions Neurological: as above Psychiatric: No depression at this time.  No anxiety Endocrine: No palpitations, diaphoresis, change in appetite, change in weigh or increased thirst Hematologic/Lymphatic:  No anemia, purpura, petechiae. Allergic/Immunologic: No itchy/runny eyes, nasal congestion, recent allergic reactions, rashes  ALLERGIES: Allergies  Allergen Reactions   Statins Other (See Comments)    Muscle weakness   Other     EKG pads causes blisters    Sulfa Antibiotics Rash    HOME MEDICATIONS:  Current Outpatient Medications:    buPROPion  (WELLBUTRIN  SR) 150 MG 12 hr tablet, TAKE ONE TABLET BY MOUTH TWICE A DAY, Disp: 180 tablet, Rfl: 1   Cholecalciferol (VITAMIN D3) 125 MCG (5000  UT) TABS, Take 5,000 Units by mouth daily at 12 noon., Disp: , Rfl:    Coenzyme Q10 100 MG capsule, Take 100 mg by mouth daily at 12 noon., Disp: , Rfl:    diazepam  (VALIUM ) 5 MG tablet, Take 1 tablet (5 mg total) by mouth daily as needed for anxiety or muscle spasms., Disp: 30 tablet, Rfl: 3   docusate sodium (COLACE) 100 MG capsule, Take 100 mg by mouth 2 (two) times daily. , Disp: , Rfl:    doxycycline  (VIBRA -TABS)  100 MG tablet, Take 1 tablet (100 mg total) by mouth 2 (two) times daily., Disp: 14 tablet, Rfl: 0   estradiol  (ESTRACE ) 1 MG tablet, Take 1 mg by mouth daily., Disp: , Rfl:    oxyCODONE -acetaminophen  (PERCOCET/ROXICET) 5-325 MG tablet, TAKE 1 TABLET DAILY AS NEEDED FOR SEVERE PAIN., Disp: 30 tablet, Rfl: 0   progesterone  (PROMETRIUM ) 200 MG capsule, Take 1 capsule (200 mg total) by mouth daily., Disp: 90 capsule, Rfl: 1   warfarin (COUMADIN ) 5 MG tablet, Take 1 tablet to 1 1/2 tablets daily as directed by coumadin  clinic, Disp: 135 tablet, Rfl: 1   ofloxacin  (OCUFLOX ) 0.3 % ophthalmic solution, Place 1 drop into the right eye 4 (four) times daily., Disp: 5 mL, Rfl: 0  Current Facility-Administered Medications:    botulinum toxin Type A  (BOTOX ) injection 100 Units, 100 Units, Intramuscular, Once, Haedyn Ancrum, Sherida Dimmer, MD  PAST MEDICAL HISTORY: Past Medical History:  Diagnosis Date   Adrenal adenoma    Allergy    Anemia    Anxiety    CAD (coronary artery disease)    a. 50-60% mid LAD at cardiac catheterization 2014 ([place] ) b. non-obstructive CAD by cath in 09/2018   Cataract    Depression    DJD (degenerative joint disease) of cervical spine 09/12/2016   Dysrhythmia    Essential hypertension 10/15/2023   H. pylori infection 07/19/2017   History of cardiomyopathy    History of COVID-19 12/10/2019   History of tobacco use    Hyperlipidemia    Hypothyroidism 08/24/2022   Mid back pain 02/08/2017   Neuromuscular disorder (HCC)    PAF (paroxysmal atrial fibrillation) (HCC)    Diagnosis July 2017 - spontaneously resolved   Smell disturbance 06/13/2021   Statin intolerance     PAST SURGICAL HISTORY: Past Surgical History:  Procedure Laterality Date   ABDOMINAL HYSTERECTOMY  1980   endometriosis   BIOPSY  07/18/2021   Procedure: BIOPSY;  Surgeon: Urban Garden, MD;  [facility];  Service: Gastroenterology;;   BREAST BIOPSY Right 07/29/2023   MM RT BREAST  BX W LOC DEV 1ST LESION IMAGE BX SPEC STEREO GUIDE 07/29/2023 [facility]   CATARACT EXTRACTION Left 11/2018   COLONOSCOPY WITH PROPOFOL  N/A 06/27/2022   Procedure: COLONOSCOPY WITH PROPOFOL ;  Surgeon: Urban Garden, MD;  [facility]: [facility];  Service: Gastroenterology;  Laterality: N/A;  11:15   CORONARY PRESSURE/FFR STUDY N/A 06/01/2021   Procedure: INTRAVASCULAR PRESSURE WIRE/FFR STUDY;  Surgeon: Arty Binning, MD;  [facility];  Service: Cardiovascular;  Laterality: N/A;   ESOPHAGOGASTRODUODENOSCOPY (EGD) WITH PROPOFOL  N/A 07/18/2021   Procedure: ESOPHAGOGASTRODUODENOSCOPY (EGD) WITH PROPOFOL ;  Surgeon: Urban Garden, MD;  [facility]: [facility];  Service: Gastroenterology;  Laterality: N/A;  12:00   EYE SURGERY  cataracts  2021/2022   HEMOSTASIS CLIP PLACEMENT  06/27/2022   Procedure: HEMOSTASIS CLIP PLACEMENT;  Surgeon: Urban Garden, MD;  [facility]: [facility];  Service:  Gastroenterology;;   LEFT HEART CATH AND CORONARY ANGIOGRAPHY N/A 09/25/2018   Procedure: LEFT HEART CATH AND CORONARY ANGIOGRAPHY;  Surgeon: Arty Binning, MD;  [facility];  Service: Cardiovascular;  Laterality: N/A;   LEFT HEART CATH AND CORONARY ANGIOGRAPHY N/A 06/01/2021   Procedure: LEFT HEART CATH AND CORONARY ANGIOGRAPHY;  Surgeon: Arty Binning, MD;  [facility];  Service: Cardiovascular;  Laterality: N/A;   POLYPECTOMY  06/27/2022   Procedure: POLYPECTOMY;  Surgeon: Urban Garden, MD;  [facility];  Service: Gastroenterology;;   Dixie Frederickson DILATION  07/18/2021   Procedure: Dixie Frederickson DILATION;  Surgeon: Umberto Ganong, Bearl Limes, MD;  [facility];  Service: Gastroenterology;;   SUBMUCOSAL LIFTING INJECTION  06/27/2022   Procedure: SUBMUCOSAL LIFTING INJECTION;  Surgeon: Umberto Ganong, Bearl Limes, MD;  [facility];  Service: Gastroenterology;;   SUBMUCOSAL TATTOO INJECTION   06/27/2022   Procedure: SUBMUCOSAL TATTOO INJECTION;  Surgeon: Umberto Ganong, Bearl Limes, MD;  [facility];  Service: Gastroenterology;;   TONSILLECTOMY  1954    FAMILY HISTORY: Family History  Problem Relation Age of Onset   COPD Mother    Hearing loss Mother    Stroke Mother    Heart disease Mother        chf, died at 50   Vision loss Mother    Varicose Veins Mother    Arthritis Father    Hypertension Father    Heart disease Father        cad, pvd, died at 52   Vision loss Father    Heart disease Sister        heart valve   Arthritis Sister    Depression Sister    Hyperlipidemia Brother    Hypertension Brother    Cancer - Prostate Brother     SOCIAL HISTORY:  Social History   Socioeconomic History   Marital status: Married    Spouse name: Al   Number of children: 0   Years of education: 18   Highest education level: Master's degree (e.g., MA, MS, MEng, MEd, MSW, MBA)  Occupational History   Occupation: Clinical research associate    Comment: from home  Tobacco Use   Smoking status: Former    Current packs/day: 0.00    Average packs/day: 1 pack/day for 42.8 years (42.8 ttl pk-yrs)    Types: Cigarettes    Start date: 11/12/1965    Quit date: 09/06/2008    Years since quitting: 15.5   Smokeless tobacco: Never  Vaping Use   Vaping status: Never Used  Substance and Sexual Activity   Alcohol use: Yes    Alcohol/week: 2.0 standard drinks of alcohol    Types: 1 Glasses of wine, 1 Shots of liquor per week    Comment: occasional   Drug use: Yes    Types: Hydrocodone, Marijuana    Comment: prescribed Percocet for myofacial pain syndrome   Sexual activity: Yes    Birth control/protection: Post-menopausal  Other Topics Concern   Not on file  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   Lives with husband AL   No children   Healthy diet and lifestyle   Social Drivers of Health   Financial Resource Strain: Low Risk  (10/11/2023)   Overall Financial Resource Strain  (CARDIA)    Difficulty of Paying Living Expenses: Not very hard  Food Insecurity: No Food Insecurity (10/11/2023)   Hunger Vital Sign    Worried About Running Out of Food  in the Last Year: Never true    Ran Out of Food in the Last Year: Never true  Transportation Needs: No Transportation Needs (10/11/2023)   PRAPARE - Administrator, Civil Service (Medical): No    Lack of Transportation (Non-Medical): No  Physical Activity: Sufficiently Active (10/11/2023)   Exercise Vital Sign    Days of Exercise per Week: 3 days    Minutes of Exercise per Session: 120 min  Stress: No Stress Concern Present (10/11/2023)   [employer] of Occupational Health - Occupational Stress Questionnaire    Feeling of Stress : Not at all  Social Connections: Moderately Isolated (10/11/2023)   Social Connection and Isolation Panel [NHANES]    Frequency of Communication with Friends and Family: Twice a week    Frequency of Social Gatherings with Friends and Family: Once a week    Attends Religious Services: Never    Database administrator or Organizations: No    Attends Engineer, structural: Not on file    Marital Status: Married  Catering manager Violence: Not At Risk (09/13/2021)   Humiliation, Afraid, Rape, and Kick questionnaire    Fear of Current or Ex-Partner: No    Emotionally Abused: No    Physically Abused: No    Sexually Abused: No     PHYSICAL EXAM   General: The patient is well-developed and well-nourished and in no acute distress.  Head is normocephalic and atraumatic.   Musculoskeletal : There is tenderness in the mid to lower thoracic intercoastal muscles from T6-T9 on the right .  The neck was nontender today.  Neurologic Exam  Mental status: The patient is alert and oriented x 3 at the time of the examination.   Focus and attention seem normal.  Speech appeared normal.  No aphasia was noted during the conversation and she had no problems, with the right  words Cranial nerves: Extraocular muscles are intact.  Facial strength is normal.   . No obvious hearing deficits are noted.  Motor: She has normal muscle tone, muscle bulk and muscle strength in the arms or legs.  gait and station: Gait and station are normal.  The Romberg is negative    ASSESSMENT AND PLAN  1. Myofascial pain syndrome   2. Other muscle spasm   3. Muscle spasm   4. Cognitive complaints   5. Mid back pain       1.  Inject 100 units of Botox  using 30-gauge needle split into tender points below the T6-T9 ribs into the intercostal muscles near the midclavicular line.  There were no complications and she tolerated the injections well.   2.   She no longer has cognitive concerns have improved.  Neurocognitive testing was normal.  I do not think that she has a neurodegenerative process.      3.  Stay active and exercise as tolerated.  Can take rare Percocet as needed for days when there is more intense pain.  If severe spasms take Valium   4.   She will return in 3-4 months for next Botox  or sooner if  new or worsening neurologic symptoms.  Rotunda Worden A. Godwin Lat, MD, PhD 03/16/2024, 2:53 PM Certified in Neurology, Clinical Neurophysiology, Sleep Medicine, Pain Medicine and Neuroimaging  [facility] [address] [place], [place] [address] [phone]

## 2024-03-16 NOTE — Progress Notes (Signed)
 Botox -100unitsx1 vial Lot: [id number] Expiration: 03/2026 NDC: 5409-8119-14  0.9% Sodium Chloride - 4mL total [id number] Expiration: 09/12/2024 NDC: 0865-7846-96  Richard Sater,MD drew up medication.  Dx:  Muscle spasm M62.838   Buy/bill

## 2024-04-15 ENCOUNTER — Encounter: Payer: Self-pay | Admitting: Neurology

## 2024-04-15 MED ORDER — OXYCODONE-ACETAMINOPHEN 5-325 MG PO TABS: ORAL_TABLET | ORAL | 0 refills | Status: DC

## 2024-04-15 NOTE — Telephone Encounter (Signed)
 Last seen on 03/16/24 for botox   Follow up botox  06/10/24    Dispensed Days Supply Quantity Provider Pharmacy  OXYCODONE /ACETAMINOPHEN  5-325MG  TAB 02/25/2024 30 30 each Sater, Sherida Dimmer, MD [facility]...      Rx pending to be signed

## 2024-04-20 ENCOUNTER — Encounter

## 2024-04-22 ENCOUNTER — Ambulatory Visit: Attending: Cardiology | Admitting: *Deleted

## 2024-04-22 DIAGNOSIS — I480 Paroxysmal atrial fibrillation: Secondary | ICD-10-CM | POA: Insufficient documentation

## 2024-04-22 DIAGNOSIS — Z5181 Encounter for therapeutic drug level monitoring: Secondary | ICD-10-CM | POA: Diagnosis not present

## 2024-04-22 LAB — POCT INR: INR: 1.9 — AB (ref 2.0–3.0)

## 2024-04-22 NOTE — Patient Instructions (Signed)
 Take warfarin 2 tablets tonight then increase dose to 1 1/2 tablets daily Recheck INR in 6 wks.

## 2024-05-05 DIAGNOSIS — M79644 Pain in right finger(s): Secondary | ICD-10-CM | POA: Diagnosis not present

## 2024-05-13 DIAGNOSIS — M545 Low back pain, unspecified: Secondary | ICD-10-CM | POA: Diagnosis not present

## 2024-05-14 ENCOUNTER — Ambulatory Visit: Payer: Self-pay

## 2024-05-14 ENCOUNTER — Encounter (HOSPITAL_COMMUNITY): Payer: Self-pay

## 2024-05-14 ENCOUNTER — Emergency Department (HOSPITAL_COMMUNITY)
Admission: EM | Admit: 2024-05-14 | Discharge: 2024-05-14 | Disposition: A | Discharge status: Home / Self Care | Attending: Emergency Medicine | Admitting: Emergency Medicine

## 2024-05-14 ENCOUNTER — Telehealth: Payer: Self-pay | Admitting: Neurology

## 2024-05-14 ENCOUNTER — Other Ambulatory Visit: Payer: Self-pay

## 2024-05-14 DIAGNOSIS — Z7901 Long term (current) use of anticoagulants: Secondary | ICD-10-CM | POA: Diagnosis not present

## 2024-05-14 DIAGNOSIS — Z79899 Other long term (current) drug therapy: Secondary | ICD-10-CM | POA: Diagnosis not present

## 2024-05-14 DIAGNOSIS — R001 Bradycardia, unspecified: Secondary | ICD-10-CM | POA: Diagnosis not present

## 2024-05-14 DIAGNOSIS — M549 Dorsalgia, unspecified: Secondary | ICD-10-CM | POA: Diagnosis present

## 2024-05-14 DIAGNOSIS — I10 Essential (primary) hypertension: Secondary | ICD-10-CM | POA: Diagnosis not present

## 2024-05-14 DIAGNOSIS — I2510 Atherosclerotic heart disease of native coronary artery without angina pectoris: Secondary | ICD-10-CM | POA: Diagnosis not present

## 2024-05-14 DIAGNOSIS — M62830 Muscle spasm of back: Secondary | ICD-10-CM | POA: Insufficient documentation

## 2024-05-14 LAB — URINALYSIS, ROUTINE W REFLEX MICROSCOPIC
Bilirubin Urine: NEGATIVE
Glucose, UA: NEGATIVE mg/dL
Hgb urine dipstick: NEGATIVE
Ketones, ur: NEGATIVE mg/dL
Leukocytes,Ua: NEGATIVE
Nitrite: NEGATIVE
Protein, ur: NEGATIVE mg/dL
Specific Gravity, Urine: 1.011 (ref 1.005–1.030)
pH: 6 (ref 5.0–8.0)

## 2024-05-14 LAB — CBC WITH DIFFERENTIAL/PLATELET
Abs Immature Granulocytes: 0.03 10*3/uL (ref 0.00–0.07)
Basophils Absolute: 0 10*3/uL (ref 0.0–0.1)
Basophils Relative: 1 %
Eosinophils Absolute: 0 10*3/uL (ref 0.0–0.5)
Eosinophils Relative: 1 %
HCT: 37.5 % (ref 36.0–46.0)
Hemoglobin: 12.2 g/dL (ref 12.0–15.0)
Immature Granulocytes: 1 %
Lymphocytes Relative: 23 %
Lymphs Abs: 1.5 10*3/uL (ref 0.7–4.0)
MCH: 30.2 pg (ref 26.0–34.0)
MCHC: 32.5 g/dL (ref 30.0–36.0)
MCV: 92.8 fL (ref 80.0–100.0)
Monocytes Absolute: 0.2 10*3/uL (ref 0.1–1.0)
Monocytes Relative: 3 %
Neutro Abs: 4.7 10*3/uL (ref 1.7–7.7)
Neutrophils Relative %: 71 %
Platelets: 216 10*3/uL (ref 150–400)
RBC: 4.04 MIL/uL (ref 3.87–5.11)
RDW: 13.9 % (ref 11.5–15.5)
WBC: 6.5 10*3/uL (ref 4.0–10.5)
nRBC: 0 % (ref 0.0–0.2)

## 2024-05-14 LAB — BASIC METABOLIC PANEL WITH GFR
Anion gap: 8 (ref 5–15)
BUN: 17 mg/dL (ref 8–23)
CO2: 24 mmol/L (ref 22–32)
Calcium: 9.3 mg/dL (ref 8.9–10.3)
Chloride: 104 mmol/L (ref 98–111)
Creatinine, Ser: 0.69 mg/dL (ref 0.44–1.00)
GFR, Estimated: >60 mL/min (ref >60)
Glucose, Bld: 115 mg/dL — ABNORMAL HIGH (ref 70–99)
Potassium: 4.7 mmol/L (ref 3.5–5.1)
Sodium: 136 mmol/L (ref 135–145)

## 2024-05-14 MED ORDER — DIAZEPAM 5 MG/ML IJ SOLN
2.5000 mg | Freq: Once | INTRAMUSCULAR | Status: AC
  Administered 2024-05-14: 2.5 mg via INTRAVENOUS
  Filled 2024-05-14: qty 2

## 2024-05-14 MED ORDER — DIAZEPAM 5 MG PO TABS: 5.0000 mg | ORAL_TABLET | Freq: Two times a day (BID) | ORAL | 0 refills | Status: DC | PRN

## 2024-05-14 MED ORDER — ONDANSETRON HCL 4 MG/2ML IJ SOLN
4.0000 mg | Freq: Once | INTRAMUSCULAR | Status: AC
  Administered 2024-05-14: 4 mg via INTRAVENOUS
  Filled 2024-05-14 (×2): qty 2

## 2024-05-14 MED ORDER — MORPHINE SULFATE (PF) 4 MG/ML IV SOLN
4.0000 mg | Freq: Once | INTRAVENOUS | Status: AC
  Administered 2024-05-14: 4 mg via INTRAVENOUS
  Filled 2024-05-14 (×2): qty 1

## 2024-05-14 NOTE — Telephone Encounter (Signed)
 FYI Only or Action Required?: FYI only for provider.  Patient was last seen in primary care on 11/21/2023 by Tobie Suzzane POUR, MD. Called Nurse Triage reporting Back Pain. Symptoms began 2 days ago. Interventions attempted: Prescription medications: Flexeril , Rest, hydration, or home remedies, and Ice/heat application. Symptoms are: unchanged.  Triage Disposition: Go to ED Now (Notify PCP)  Patient/caregiver understands and will follow disposition?: Yes  Copied from CRM [id number]. Topic: Clinical - Red Word Triage >> May 14, 2024  9:40 AM Turkey B wrote: [facility] that prompted transfer to Nurse Triage: pt has severe back pain Reason for Disposition  [1] SEVERE back pain (e.g., excruciating) AND [2] sudden onset AND [3] age > 60 years  Answer Assessment - Initial Assessment Questions 1. ONSET: When did the pain begin?      Two days ago 2. LOCATION: Where does it hurt? (upper, mid or lower back)     Upper, mid and lower back pain 3. SEVERITY: How bad is the pain?  (e.g., Scale 1-10; mild, moderate, or severe)   - MILD (1-3): Doesn't interfere with normal activities.    - MODERATE (4-7): Interferes with normal activities or awakens from sleep.    - SEVERE (8-10): Excruciating pain, unable to do any normal activities.      9.5 4. PATTERN: Is the pain constant? (e.g., yes, no; constant, intermittent)      constant 5. RADIATION: Does the pain shoot into your legs or somewhere else?     no 6. CAUSE:  What do you think is causing the back pain?      unsure 7. BACK OVERUSE:  Any recent lifting of heavy objects, strenuous work or exercise?     Patient does work at [employer] and helps to [employer]. 8. MEDICINES: What have you taken so far for the pain? (e.g., nothing, acetaminophen , NSAIDS)     Muscle relaxer-not helping 9. NEUROLOGIC SYMPTOMS: Do you have any weakness, numbness, or problems with bowel/bladder control?     no 10. OTHER SYMPTOMS: Do you have any other  symptoms? (e.g., fever, abdomen pain, burning with urination, blood in urine)       Nausea  Patient's husband calling with concerns for patient's severe back pain. Reports patient was seen at a walk in urgent care yesterday for back pain. Patient verbalized that her pain is the entirety of her back with specific areas that she can point to for pain. Patient was given a brace yesterday at Urgent care-was told that it might help as putting pressure on the areas appeared to help per patient report. Patient's husband reports patient is laying on the floor as that is giving her the most relief currently. Reports no falls or any trauma.  Patient was given muscle relaxers which aren't helping per patient. Discussed with patient being seen in the office vs ED. Recommended ED due to symptoms and nature of pain per her report. Patient and husband agreed to this. Discussed the need for EMS to transport patient due to comfort level. Patient's husband and patient verbalized understanding and all questions answered.  Protocols used: Back Pain-A-AH

## 2024-05-14 NOTE — Telephone Encounter (Signed)
 Called and spoke with spouse. Relayed Dr. Vear in agreement that she should proceed to ED for immediate evaluation for severe back pain. They verbalized understanding and will proceed to ED.

## 2024-05-14 NOTE — Discharge Instructions (Addendum)
 Stop the flexeril .

## 2024-05-14 NOTE — ED Triage Notes (Signed)
 Pt arrived via POV c/o severe back pain X 2 days. Pt seen at Urgent Care and was given muscle relaxer's and a back brace with minimal relief. Pt reports the only relief felt in 2 days was after receiving a shot of Toradol .

## 2024-05-14 NOTE — ED Notes (Signed)
 Crackers and coke given to pt per request and approval of EDP.

## 2024-05-14 NOTE — Telephone Encounter (Signed)
 Pt husband called in to request to speak to MD. Pt went to [facility] and they persicbe medication for pt . Pt husband states that medication is making Pt uncomfortable and might  need MD advice or what should they do?

## 2024-05-14 NOTE — ED Provider Notes (Signed)
 Heather [name] AT [name]   CSN: [id number] Arrival date & time: 05/14/24  1021     Patient presents with: Back Pain   Heather [name] is a 78 y.o. female.   Pt is a 78 yo female with pmhx significant for anemia, anxiety, depression, hld, djd, cad, paroxysmal afib (on coumadin ), cad, and ddd.  Pt is a Comptroller and often lifts heavy things.  She developed back pain 2 days ago. She went to [facility] and had an xray and was d/c with flexeril .  Flexeril  was not helping, so she came in.  No fevers.  No urinary sx.       Prior to Admission medications   Medication Sig Start Date End Date Taking? Authorizing Provider  diazepam  (VALIUM ) 5 MG tablet Take 1 tablet (5 mg total) by mouth 2 (two) times daily as needed for muscle spasms. 05/14/24  Yes Dean Clarity, MD  buPROPion  (WELLBUTRIN  SR) 150 MG 12 hr tablet TAKE ONE TABLET BY MOUTH TWICE A DAY 06/02/21   Gosrani, Nimish C, MD  Cholecalciferol (VITAMIN D3) 125 MCG (5000 UT) TABS Take 5,000 Units by mouth daily at 12 noon.    [provider]  Coenzyme Q10 100 MG capsule Take 100 mg by mouth daily at 12 noon.    [provider]  diazepam  (VALIUM ) 5 MG tablet Take 1 tablet (5 mg total) by mouth daily as needed for anxiety or muscle spasms. 02/03/24   Sater, Charlie LABOR, MD  docusate sodium (COLACE) 100 MG capsule Take 100 mg by mouth 2 (two) times daily.     [provider]  doxycycline  (VIBRA -TABS) 100 MG tablet Take 1 tablet (100 mg total) by mouth 2 (two) times daily. 03/10/24   Tobie Suzzane POUR, MD  estradiol  (ESTRACE ) 1 MG tablet Take 1 mg by mouth daily.    [provider]  oxyCODONE -acetaminophen  (PERCOCET/ROXICET) 5-325 MG tablet TAKE 1 TABLET DAILY AS NEEDED FOR SEVERE PAIN. 04/15/24   Sater, Charlie LABOR, MD  progesterone  (PROMETRIUM ) 200 MG capsule Take 1 capsule (200 mg total) by mouth daily. 02/21/21   Gosrani, Nimish C, MD  warfarin (COUMADIN ) 5 MG tablet Take 1  tablet to 1 1/2 tablets daily as directed by coumadin  clinic 01/13/24   Debera Jayson MATSU, MD    Allergies: Statins, Other, and Sulfa antibiotics    Review of Systems  Musculoskeletal:  Positive for back pain.  All other systems reviewed and are negative.   Updated Vital Signs BP (!) 131/56   Pulse (!) 47   Temp (!) 97.2 F (36.2 C) (Temporal)   Resp 18   Ht 5' 4 (1.626 m)   Wt 68 kg   LMP 07/08/1979 (Approximate)   SpO2 97%   BMI 25.73 kg/m   Physical Exam Vitals and nursing [name] reviewed.  Constitutional:      Appearance: Normal appearance.  HENT:     Head: Normocephalic and atraumatic.     Right Ear: External ear normal.     Left Ear: External ear normal.     Nose: Nose normal.     Mouth/Throat:     Mouth: Mucous membranes are moist.     Pharynx: Oropharynx is clear.  Eyes:     Extraocular Movements: Extraocular movements intact.     Conjunctiva/sclera: Conjunctivae normal.     Pupils: Pupils are equal, round, and reactive to light.  Cardiovascular:     Rate and Rhythm: Regular rhythm. Bradycardia present.  Pulses: Normal pulses.     Heart sounds: Normal heart sounds.  Pulmonary:     Effort: Pulmonary effort is normal.     Breath sounds: Normal breath sounds.  Abdominal:     General: Abdomen is flat. Bowel sounds are normal.     Palpations: Abdomen is soft.  Musculoskeletal:       Arms:     Cervical back: Normal range of motion and neck supple.     Comments: Spasm to back  Skin:    General: Skin is warm.     Capillary Refill: Capillary refill takes less than 2 seconds.  Neurological:     General: No focal deficit present.     Mental Status: She is alert and oriented to person, place, and time.  Psychiatric:        Mood and Affect: Mood normal.        Behavior: Behavior normal.     (all labs ordered are listed, but only abnormal results are displayed) Labs Reviewed  BASIC METABOLIC PANEL WITH GFR - Abnormal; Notable for the following  components:      Result Value   Glucose, Bld 115 (*)    All other components within normal limits  URINALYSIS, ROUTINE W REFLEX MICROSCOPIC - Abnormal; Notable for the following components:   APPearance HAZY (*)    All other components within normal limits  CBC WITH DIFFERENTIAL/PLATELET    EKG: None  Radiology: No results found.   Procedures   Medications Ordered in the ED  morphine  (PF) 4 MG/ML injection 4 mg (4 mg Intravenous Given 05/14/24 1301)  ondansetron  (ZOFRAN ) injection 4 mg (4 mg Intravenous Given 05/14/24 1301)  diazepam  (VALIUM ) injection 2.5 mg (2.5 mg Intravenous Given 05/14/24 1417)                                    Medical Decision Making Amount and/or Complexity of Data Reviewed Labs: ordered.  Risk Prescription drug management.   This patient presents to the ED for concern of back pain, this involves an extensive number of treatment options, and is a complaint that carries with it a high risk of complications and morbidity.  The differential diagnosis includes msk, kidney stone,    Co morbidities that complicate the patient evaluation  anemia, anxiety, depression, hld, djd, cad, paroxysmal afib (on coumadin ), cad, and ddd   Additional history obtained:  Additional history obtained from epic chart review External records from outside source obtained and reviewed including husband   Lab Tests:  I Ordered, and personally interpreted labs.  The pertinent results include:  cbc nl, bmp nl, ua nl   Medicines ordered and prescription drug management:  I ordered medication including morphine /valium /zofran   for sx  Reevaluation of the patient after these medicines showed that the patient improved I have reviewed the patients home medicines and have made adjustments as needed   Test Considered:  ct   Problem List / ED Course:  Back spasms:  I can feel the back spasms.  She also hurts a lot when she moves.  I don't think this is an internal  issue.  Pt is feeling much improved after the meds.  She is stable for d/c.  She is to return if worse.  She has percocet at home.  She is to stop the flexeril  and take percocet.   Reevaluation:  After the interventions noted above, I reevaluated the patient  and found that they have :improved   Social Determinants of Health:  Lives at home   Dispostion:  After consideration of the diagnostic results and the patients response to treatment, I feel that the patent would benefit from discharge with outpatient f/u.       Final diagnoses:  Muscle spasm of back    ED Discharge Orders          Ordered    diazepam  (VALIUM ) 5 MG tablet  2 times daily PRN        05/14/24 1358               Dean Clarity, MD 05/14/24 1507

## 2024-05-14 NOTE — ED Notes (Signed)
 Pt requests if she needs a prescription, to receive a paper prescription so she can compare costs at different pharmacies.   During Triage, Pt unable to sit down. Pt presents standing flat against the door as this is the only way for Pt to find a  little relief.   Per Pts spouse, Pt prefers laying on the floor at home, and reports that works better than the back brace. Pt denies injuries. 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

## 2024-06-01 ENCOUNTER — Ambulatory Visit: Attending: Cardiology | Admitting: *Deleted

## 2024-06-01 DIAGNOSIS — I480 Paroxysmal atrial fibrillation: Secondary | ICD-10-CM | POA: Diagnosis not present

## 2024-06-01 DIAGNOSIS — Z5181 Encounter for therapeutic drug level monitoring: Secondary | ICD-10-CM | POA: Diagnosis not present

## 2024-06-01 LAB — POCT INR: INR: 2.8 (ref 2.0–3.0)

## 2024-06-01 NOTE — Patient Instructions (Signed)
 Continue warfarin 1 1/2 tablets daily Recheck INR in 7 wks.

## 2024-06-01 NOTE — Progress Notes (Signed)
Please see anticoagulation encounter.

## 2024-06-08 ENCOUNTER — Telehealth: Payer: Self-pay | Admitting: Neurology

## 2024-06-08 NOTE — Telephone Encounter (Signed)
 request to cancel appointment due to work conflict, will call back to r/s

## 2024-06-10 ENCOUNTER — Ambulatory Visit: Admitting: Neurology

## 2024-06-17 ENCOUNTER — Encounter: Payer: Self-pay | Admitting: Neurology

## 2024-06-22 ENCOUNTER — Other Ambulatory Visit: Payer: Self-pay

## 2024-06-22 MED ORDER — OXYCODONE-ACETAMINOPHEN 5-325 MG PO TABS: ORAL_TABLET | ORAL | 0 refills | Status: DC

## 2024-06-22 NOTE — Telephone Encounter (Signed)
 Pt last seen 03/16/2024 Upcoming Visit 07/06/2024  Oxycodone  Last filled 04/17/2024

## 2024-07-06 ENCOUNTER — Ambulatory Visit (INDEPENDENT_AMBULATORY_CARE_PROVIDER_SITE_OTHER): Admitting: Neurology

## 2024-07-06 ENCOUNTER — Encounter: Payer: Self-pay | Admitting: Neurology

## 2024-07-06 VITALS — BP 137/81 | HR 73

## 2024-07-06 DIAGNOSIS — M7918 Myalgia, other site: Secondary | ICD-10-CM

## 2024-07-06 DIAGNOSIS — M62838 Other muscle spasm: Secondary | ICD-10-CM

## 2024-07-06 MED ORDER — ONABOTULINUMTOXINA 100 UNITS IJ SOLR
100.0000 [IU] | Freq: Once | INTRAMUSCULAR | Status: AC
Start: 2024-07-06 — End: 2024-07-06
  Administered 2024-07-06: 100 [IU] via INTRAMUSCULAR

## 2024-07-06 NOTE — Progress Notes (Signed)
 [facility]  PATIENT: Heather [name] DOB: December 18, 1945  REFERRING DOCTOR OR PCP:  Jamee Moose SOURCE: patient  _________________________________   HISTORICAL  CHIEF COMPLAINT:  Chief Complaint  Patient presents with   RM10/BOTOX     Pt is here with her Husband. Pt is needing a letter stating her medications and why she takes them.     HISTORY OF PRESENT ILLNESS:  Heather [name] is a 78 y.o. woman who has had chronic right-sided mid back pain since 2013.      Update 07/06/2024 She reports that her myofascial pain continues to do well with the Botox  therapy.  Is located in the right trunk, predominantly in the intercoastal muscles from around T7-T11  .  The Botox  injection has markedly reduced the thoracic pain and has made her more functional, allowing her to work.   Pain started back a couple weeks ago when she was active and today while resting.  .  When she has more of the myofascial pain pain, she will take half of a Percocet.  If she notes more spasticity she will take half of Valium .  Prescriptions have lasted multiple months.   THe Botox  has greatly reduced tha pain for 2.5 to 3 months  Cognition is stable. She is not noting any more speech issues.  MRI of the brain showed mild atrophy.   There is no lobar predominant pattern.    She sleeps well   She never combines with a percocet.    She is on coumadin  for atrial fibrillation.  If she notes more palpitations, she takes a valium  (rare).      IMAGING:  MRI results from 10/14/2013: The MRI of the brain showed age related atrophy and minimal chronic microvascular ischemic change. MRI of the cervical spine showed multilevel mild degenerative changes with left paramedian disc herniation at C3-C4 and left disc protrusion at C4-C5 and C5-C6 and midline disc herniation at C6-C7. There was no report of nerve root compression. MRI of the thoracic spine showed disc desiccation but no herniation or protrusions. MRI of  the lumbar spine showed disc bulges at T12-L1 and L2-L3 and disc bulge with facet hypertrophy at L3-L4 and disc bulge with right foraminal annular tear at L4-L5.  MRI brain 7/4/2-22 was personally reviewed.  It shows mild generalized atrophy.  No acute findings.     REVIEW OF SYSTEMS: Constitutional: No fevers, chills, sweats, or change in appetite Eyes: No visual changes, double vision, eye pain Ear, nose and throat: No hearing loss, ear pain, nasal congestion, sore throat Cardiovascular: No chest pain, palpitations.   She has atrial fibrillation and is on warfarin Respiratory:  No shortness of breath at rest or with exertion.   No wheezes GastrointestinaI: No nausea, vomiting, diarrhea, abdominal pain, fecal incontinence Genitourinary:  No dysuria, urinary retention or frequency.  No nocturia. Musculoskeletal:  No neck pain.  She has back pain/thoracic pain as above Integumentary: No rash, pruritus, skin lesions Neurological: as above Psychiatric: No depression at this time.  No anxiety Endocrine: No palpitations, diaphoresis, change in appetite, change in weigh or increased thirst Hematologic/Lymphatic:  No anemia, purpura, petechiae. Allergic/Immunologic: No itchy/runny eyes, nasal congestion, recent allergic reactions, rashes  ALLERGIES: Allergies  Allergen Reactions   Statins Other (See Comments)    Muscle weakness   Other     EKG pads causes blisters    Sulfa Antibiotics Rash    HOME MEDICATIONS:  Current Outpatient Medications:    buPROPion  (WELLBUTRIN  SR) 150 MG 12  hr tablet, TAKE ONE TABLET BY MOUTH TWICE A DAY, Disp: 180 tablet, Rfl: 1   Cholecalciferol (VITAMIN D3) 125 MCG (5000 UT) TABS, Take 5,000 Units by mouth daily at 12 noon., Disp: , Rfl:    Coenzyme Q10 100 MG capsule, Take 100 mg by mouth daily at 12 noon., Disp: , Rfl:    diazepam  (VALIUM ) 5 MG tablet, Take 1 tablet (5 mg total) by mouth daily as needed for anxiety or muscle spasms., Disp: 30 tablet, Rfl: 3    docusate sodium (COLACE) 100 MG capsule, Take 100 mg by mouth 2 (two) times daily. , Disp: , Rfl:    estradiol  (ESTRACE ) 1 MG tablet, Take 1 mg by mouth daily., Disp: , Rfl:    oxyCODONE -acetaminophen  (PERCOCET/ROXICET) 5-325 MG tablet, TAKE 1 TABLET DAILY AS NEEDED FOR SEVERE PAIN., Disp: 30 tablet, Rfl: 0   progesterone  (PROMETRIUM ) 200 MG capsule, Take 1 capsule (200 mg total) by mouth daily., Disp: 90 capsule, Rfl: 1   warfarin (COUMADIN ) 5 MG tablet, Take 1 tablet to 1 1/2 tablets daily as directed by coumadin  clinic, Disp: 135 tablet, Rfl: 1   diazepam  (VALIUM ) 5 MG tablet, Take 1 tablet (5 mg total) by mouth 2 (two) times daily as needed for muscle spasms. (Patient not taking: Reported on 07/06/2024), Disp: 10 tablet, Rfl: 0   doxycycline  (VIBRA -TABS) 100 MG tablet, Take 1 tablet (100 mg total) by mouth 2 (two) times daily. (Patient not taking: Reported on 07/06/2024), Disp: 14 tablet, Rfl: 0  Current Facility-Administered Medications:    botulinum toxin Type A  (BOTOX ) injection 100 Units, 100 Units, Intramuscular, Once,   PAST MEDICAL HISTORY: Past Medical History:  Diagnosis Date   Adrenal adenoma    Allergy    Anemia    Anxiety    CAD (coronary artery disease)    a. 50-60% mid LAD at cardiac catheterization 2014 ([place] ) b. non-obstructive CAD by cath in 09/2018   Cataract    Depression    DJD (degenerative joint disease) of cervical spine 09/12/2016   Dysrhythmia    Essential hypertension 10/15/2023   H. pylori infection 07/19/2017   History of cardiomyopathy    History of COVID-19 12/10/2019   History of tobacco use    Hyperlipidemia    Hypothyroidism 08/24/2022   Mid back pain 02/08/2017   Neuromuscular disorder (HCC)    PAF (paroxysmal atrial fibrillation) (HCC)    Diagnosis July 2017 - spontaneously resolved   Smell disturbance 06/13/2021   Statin intolerance     PAST SURGICAL HISTORY: Past Surgical History:  Procedure Laterality Date   ABDOMINAL  HYSTERECTOMY  1980   endometriosis   BIOPSY  07/18/2021   Procedure: BIOPSY;  Surgeon: Eartha Angelia Sieving, MD;  [facility]: [facility];  Service: Gastroenterology;;   BREAST BIOPSY Right 07/29/2023   MM RT BREAST BX W LOC DEV 1ST LESION IMAGE BX SPEC STEREO GUIDE 07/29/2023 [facility]   CATARACT EXTRACTION Left 11/2018   COLONOSCOPY WITH PROPOFOL  N/A 06/27/2022   Procedure: COLONOSCOPY WITH PROPOFOL ;  Surgeon: Eartha Angelia Sieving, MD;  [facility]: [facility];  Service: Gastroenterology;  Laterality: N/A;  11:15   CORONARY PRESSURE/FFR STUDY N/A 06/01/2021   Procedure: INTRAVASCULAR PRESSURE WIRE/FFR STUDY;  Surgeon: Claudene Victory ORN, MD;  [facility];  Service: Cardiovascular;  Laterality: N/A;   ESOPHAGOGASTRODUODENOSCOPY (EGD) WITH PROPOFOL  N/A 07/18/2021   Procedure: ESOPHAGOGASTRODUODENOSCOPY (EGD) WITH PROPOFOL ;  Surgeon: Eartha Angelia Sieving, MD;  [facility];  Service: Gastroenterology;  Laterality:  N/A;  12:00   EYE SURGERY  cataracts  2021/2022   HEMOSTASIS CLIP PLACEMENT  06/27/2022   Procedure: HEMOSTASIS CLIP PLACEMENT;  Surgeon: Eartha Angelia Sieving, MD;  [facility];  Service: Gastroenterology;;   LEFT HEART CATH AND CORONARY ANGIOGRAPHY N/A 09/25/2018   Procedure: LEFT HEART CATH AND CORONARY ANGIOGRAPHY;  Surgeon: Claudene Victory ORN, MD;  [facility];  Service: Cardiovascular;  Laterality: N/A;   LEFT HEART CATH AND CORONARY ANGIOGRAPHY N/A 06/01/2021   Procedure: LEFT HEART CATH AND CORONARY ANGIOGRAPHY;  Surgeon: Claudene Victory ORN, MD;  [facility];  Service: Cardiovascular;  Laterality: N/A;   POLYPECTOMY  06/27/2022   Procedure: POLYPECTOMY;  Surgeon: Eartha Angelia Sieving, MD;  [facility];  Service: Gastroenterology;;   HARLEY DILATION  07/18/2021   Procedure: HARLEY DILATION;  Surgeon: Eartha Angelia, Sieving, MD;  [facility];  Service:  Gastroenterology;;   SUBMUCOSAL LIFTING INJECTION  06/27/2022   Procedure: SUBMUCOSAL LIFTING INJECTION;  Surgeon: Eartha Angelia, Sieving, MD;  [facility];  Service: Gastroenterology;;   SUBMUCOSAL TATTOO INJECTION  06/27/2022   Procedure: SUBMUCOSAL TATTOO INJECTION;  Surgeon: Eartha Angelia, Sieving, MD;  [facility];  Service: Gastroenterology;;   TONSILLECTOMY  1954    FAMILY HISTORY: Family History  Problem Relation Age of Onset   COPD Mother    Hearing loss Mother    Stroke Mother    Heart disease Mother        chf, died at 21   Vision loss Mother    Varicose Veins Mother    Arthritis Father    Hypertension Father    Heart disease Father        cad, pvd, died at 83   Vision loss Father    Heart disease Sister        heart valve   Arthritis Sister    Depression Sister    Hyperlipidemia Brother    Hypertension Brother    Cancer - Prostate Brother     SOCIAL HISTORY:  Social History   Socioeconomic History   Marital status: Married    Spouse name: Al   Number of children: 0   Years of education: 18   Highest education level: Master's degree (e.g., MA, MS, MEng, MEd, MSW, MBA)  Occupational History   Occupation: Clinical research associate    Comment: from home  Tobacco Use   Smoking status: Former    Current packs/day: 0.00    Average packs/day: 1 pack/day for 42.8 years (42.8 ttl pk-yrs)    Types: Cigarettes    Start date: 11/12/1965    Quit date: 09/06/2008    Years since quitting: 15.8   Smokeless tobacco: Never  Vaping Use   Vaping status: Never Used  Substance and Sexual Activity   Alcohol use: Yes    Alcohol/week: 2.0 standard drinks of alcohol    Types: 1 Glasses of wine, 1 Shots of liquor per week    Comment: occasional   Drug use: Yes    Types: Hydrocodone, Marijuana    Comment: prescribed Percocet for myofacial pain syndrome   Sexual activity: Yes    Birth control/protection: Post-menopausal  Other Topics Concern   Not on file   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   Lives with husband AL   No children   Healthy diet and lifestyle   Social Drivers of Health   Financial Resource Strain: Low Risk  (10/11/2023)  Overall Financial Resource Strain (CARDIA)    Difficulty of Paying Living Expenses: Not very hard  Food Insecurity: No Food Insecurity (10/11/2023)   Hunger Vital Sign    Worried About Running Out of Food in the Last Year: Never true    Ran Out of Food in the Last Year: Never true  Transportation Needs: No Transportation Needs (10/11/2023)   PRAPARE - Administrator, Civil Service (Medical): No    Lack of Transportation (Non-Medical): No  Physical Activity: Sufficiently Active (10/11/2023)   Exercise Vital Sign    Days of Exercise per Week: 3 days    Minutes of Exercise per Session: 120 min  Stress: No Stress Concern Present (10/11/2023)   [employer] of Occupational Health - Occupational Stress Questionnaire    Feeling of Stress : Not at all  Social Connections: Moderately Isolated (10/11/2023)   Social Connection and Isolation Panel    Frequency of Communication with Friends and Family: Twice a week    Frequency of Social Gatherings with Friends and Family: Once a week    Attends Religious Services: Never    Database administrator or Organizations: No    Attends Engineer, structural: Not on file    Marital Status: Married  Catering manager Violence: Not At Risk (09/13/2021)   Humiliation, Afraid, Rape, and Kick questionnaire    Fear of Current or Ex-Partner: No    Emotionally Abused: No    Physically Abused: No    Sexually Abused: No     PHYSICAL EXAM   General: The patient is well-developed and well-nourished and in no acute distress.  Head is normocephalic and atraumatic.   Musculoskeletal : There is tenderness in the mid to lower thoracic intercoastal muscles from T6-T9 on the right .  The neck was nontender today.  Neurologic Exam  Mental  status: The patient is alert and oriented x 3 at the time of the examination.   Focus and attention seem normal.  Speech appeared normal.  No aphasia was noted during the conversation and she had no problems, with the right words Cranial nerves: Extraocular muscles are intact.  Facial strength is normal.   . No obvious hearing deficits are noted.  Motor: She has normal muscle tone, muscle bulk and muscle strength in the arms or legs.  gait and station: Gait and station are normal.  The Romberg is negative    ASSESSMENT AND PLAN  1. Myofascial pain syndrome   2. Other muscle spasm   3. Spasm of skeletal muscle of thorax       1.  Inject 100 units of Botox  using 30-gauge needle split into tender points below the T6-T9 ribs into the intercostal muscles near the midclavicular line.  There were no complications and she tolerated the injections well.   2.    Stay active and exercise as tolerated.  Can take rare Percocet as needed for days when there is more intense pain.  If severe spasms take Valium   3.   She needs a note to travel with Percocet and Valium .   She will return in 3-4 months for next Botox  or sooner if  new or worsening neurologic symptoms.  Sherika Kubicki A. Vear, MD, PhD 07/06/2024, 3:29 PM Certified in Neurology, Clinical Neurophysiology, Sleep Medicine, Pain Medicine and Neuroimaging  [facility] [address], [address] [place], [place] [address] [phone]

## 2024-07-06 NOTE — Progress Notes (Signed)
 Botox - 100 units x 1 vial Lot: [id number] Expiration: 09/2026 NDC: 9976-8854-98  Bacteriostatic 0.9% Sodium Chloride - 4mL total Lot: [id number] Expiration: 09/11/2025 NDC: 36676-075-96  Dx: F20.81 B/B

## 2024-07-11 ENCOUNTER — Other Ambulatory Visit: Payer: Self-pay | Admitting: Cardiology

## 2024-07-11 DIAGNOSIS — I480 Paroxysmal atrial fibrillation: Secondary | ICD-10-CM

## 2024-07-12 ENCOUNTER — Encounter: Payer: Self-pay | Admitting: Internal Medicine

## 2024-07-20 ENCOUNTER — Ambulatory Visit: Attending: Cardiology | Admitting: *Deleted

## 2024-07-20 DIAGNOSIS — I480 Paroxysmal atrial fibrillation: Secondary | ICD-10-CM | POA: Diagnosis not present

## 2024-07-20 DIAGNOSIS — Z5181 Encounter for therapeutic drug level monitoring: Secondary | ICD-10-CM | POA: Insufficient documentation

## 2024-07-20 LAB — POCT INR: INR: 2 (ref 2.0–3.0)

## 2024-07-20 NOTE — Progress Notes (Signed)
 INR 2.0 Please see anticoagulation encounter.

## 2024-07-20 NOTE — Patient Instructions (Signed)
 Continue warfarin 1 1/2 tablets daily Recheck INR in 7 wks.

## 2024-07-21 ENCOUNTER — Encounter: Payer: Self-pay | Admitting: Internal Medicine

## 2024-07-21 ENCOUNTER — Other Ambulatory Visit: Payer: Self-pay | Admitting: Internal Medicine

## 2024-07-21 DIAGNOSIS — Z23 Encounter for immunization: Secondary | ICD-10-CM

## 2024-07-21 MED ORDER — SPIKEVAX 50 MCG/0.5ML IM SUSY: 0.5000 mL | PREFILLED_SYRINGE | Freq: Once | INTRAMUSCULAR | 0 refills | Status: AC

## 2024-07-24 DIAGNOSIS — Z23 Encounter for immunization: Secondary | ICD-10-CM | POA: Diagnosis not present

## 2024-07-25 ENCOUNTER — Ambulatory Visit
Admission: EM | Admit: 2024-07-25 | Discharge: 2024-07-25 | Disposition: A | Discharge status: Home / Self Care | Attending: Nurse Practitioner | Admitting: Nurse Practitioner

## 2024-07-25 DIAGNOSIS — R5383 Other fatigue: Secondary | ICD-10-CM | POA: Diagnosis not present

## 2024-07-25 DIAGNOSIS — J22 Unspecified acute lower respiratory infection: Secondary | ICD-10-CM | POA: Diagnosis not present

## 2024-07-25 MED ORDER — AZITHROMYCIN 250 MG PO TABS: 250.0000 mg | ORAL_TABLET | Freq: Every day | ORAL | 0 refills | Status: AC

## 2024-07-25 NOTE — ED Triage Notes (Addendum)
 Patient states she is on Day 11 with fatigue and coughing up phlegm. Patient states she received covid vaccine yesterday. Denies any fever. Neg home covid test.

## 2024-07-25 NOTE — Discharge Instructions (Addendum)
 Take medication as prescribed. Increase fluids and allow for plenty of rest. Continue use of normal saline nasal spray for nasal congestion or runny nose. For your chest congestion, you may find it helpful to sleep elevated at nighttime during sleep or to use a humidifier. As discussed, if the medication prescribed today does not help your symptoms and you are continue to experience fatigue, please follow-up with your primary care physician to determine whether or not follow-up regarding your thyroid  is indicated. Follow-up as needed.

## 2024-07-25 NOTE — ED Provider Notes (Signed)
 [facility]    CSN: [id number] Arrival date & time: 07/25/24  1414      History   Chief Complaint No chief complaint on file.   HPI Heather [name] is a 78 y.o. female.   The history is provided by the patient.   Patient presents for complaints of fatigue and cough that been present for the past 11 days.  Patient states symptoms started with a sore throat, which has since improved.  She states that she has also had shortness of breath with exertion.  Patient denies fever, chills, headache, ear pain, wheezing, chest pain, abdominal pain, nausea, vomiting, diarrhea, or rash.  States that she took a home COVID test yesterday which was negative, also states that she received the COVID-vaccine yesterday.  Past Medical History:  Diagnosis Date   Adrenal adenoma    Allergy    Anemia    Anxiety    CAD (coronary artery disease)    a. 50-60% mid LAD at cardiac catheterization 2014 ([place] ) b. non-obstructive CAD by cath in 09/2018   Cataract    Depression    DJD (degenerative joint disease) of cervical spine 09/12/2016   Dysrhythmia    Essential hypertension 10/15/2023   H. pylori infection 07/19/2017   History of cardiomyopathy    History of COVID-19 12/10/2019   History of tobacco use    Hyperlipidemia    Hypothyroidism 08/24/2022   Mid back pain 02/08/2017   Neuromuscular disorder (HCC)    PAF (paroxysmal atrial fibrillation) (HCC)    Diagnosis July 2017 - spontaneously resolved   Smell disturbance 06/13/2021   Statin intolerance     Patient Active Problem List   Diagnosis Date Noted   Vertigo 10/15/2023   Essential hypertension 10/15/2023   Cognitive complaints 07/01/2023   Cerebral atrophy (HCC) 02/13/2023   Foot and ankle pain 09/26/2022   Hypothyroidism 08/24/2022   Cyst of buttocks 05/04/2022   DOE (dyspnea on exertion) 10/09/2021   Post-COVID syndrome 07/31/2021   Dysphagia 07/10/2021   Tubular adenoma of colon 07/19/2017   Mid back pain  02/08/2017   Hx of colonic polyps 12/03/2016   Family history of colon cancer 12/03/2016   Encounter for therapeutic drug monitoring 11/21/2016   CAD (coronary artery disease) 09/12/2016   PAF (paroxysmal atrial fibrillation) (HCC) 09/12/2016   DJD (degenerative joint disease) of cervical spine 09/12/2016   Statin intolerance 09/12/2016   TIA (transient ischemic attack) 09/12/2016   Diverticulitis of colon 09/12/2016   Myofascial pain syndrome 09/06/2016   Takotsubo syndrome 09/06/2016   HLD (hyperlipidemia) 09/06/2016   GAD (generalized anxiety disorder) 09/06/2016   Recurrent UTI (urinary tract infection) 09/06/2016    Past Surgical History:  Procedure Laterality Date   ABDOMINAL HYSTERECTOMY  1980   endometriosis   BIOPSY  07/18/2021   Procedure: BIOPSY;  Surgeon: Eartha Angelia Sieving, MD;  [facility]: [facility];  Service: Gastroenterology;;   BREAST BIOPSY Right 07/29/2023   MM RT BREAST BX W LOC DEV 1ST LESION IMAGE BX SPEC STEREO GUIDE 07/29/2023 [facility]   CATARACT EXTRACTION Left 11/2018   COLONOSCOPY WITH PROPOFOL  N/A 06/27/2022   Procedure: COLONOSCOPY WITH PROPOFOL ;  Surgeon: Eartha Angelia Sieving, MD;  Location: [facility];  Service: Gastroenterology;  Laterality: N/A;  11:15   CORONARY PRESSURE/FFR STUDY N/A 06/01/2021   Procedure: INTRAVASCULAR PRESSURE WIRE/FFR STUDY;  Surgeon: Claudene Victory ORN, MD;  [facility];  Service: Cardiovascular;  Laterality: N/A;   ESOPHAGOGASTRODUODENOSCOPY (EGD) WITH PROPOFOL  N/A 07/18/2021  Procedure: ESOPHAGOGASTRODUODENOSCOPY (EGD) WITH PROPOFOL ;  Surgeon: Eartha Angelia Sieving, MD;  [facility]: [facility];  Service: Gastroenterology;  Laterality: N/A;  12:00   EYE SURGERY  cataracts  2021/2022   HEMOSTASIS CLIP PLACEMENT  06/27/2022   Procedure: HEMOSTASIS CLIP PLACEMENT;  Surgeon: Eartha Angelia Sieving, MD;  [facility];  Service: Gastroenterology;;   LEFT HEART CATH AND  CORONARY ANGIOGRAPHY N/A 09/25/2018   Procedure: LEFT HEART CATH AND CORONARY ANGIOGRAPHY;  Surgeon: Claudene Victory ORN, MD;  [facility]: [facility];  Service: Cardiovascular;  Laterality: N/A;   LEFT HEART CATH AND CORONARY ANGIOGRAPHY N/A 06/01/2021   Procedure: LEFT HEART CATH AND CORONARY ANGIOGRAPHY;  Surgeon: Claudene Victory ORN, MD;  [facility];  Service: Cardiovascular;  Laterality: N/A;   POLYPECTOMY  06/27/2022   Procedure: POLYPECTOMY;  Surgeon: Eartha Angelia Sieving, MD;  [facility];  Service: Gastroenterology;;   HARLEY DILATION  07/18/2021   Procedure: HARLEY DILATION;  Surgeon: Eartha Angelia, Sieving, MD;  [facility];  Service: Gastroenterology;;   SUBMUCOSAL LIFTING INJECTION  06/27/2022   Procedure: SUBMUCOSAL LIFTING INJECTION;  Surgeon: Eartha Angelia Sieving, MD;  [facility];  Service: Gastroenterology;;   SUBMUCOSAL TATTOO INJECTION  06/27/2022   Procedure: SUBMUCOSAL TATTOO INJECTION;  Surgeon: Eartha Angelia Sieving, MD;  [facility];  Service: Gastroenterology;;   TONSILLECTOMY  1954    OB History     Gravida  1   Para  0   Term      Preterm      AB  1   Living  0      SAB      IAB  1   Ectopic      Multiple      Live Births               Home Medications    Prior to Admission medications   Medication Sig Start Date End Date Taking? Authorizing Provider  azithromycin  (ZITHROMAX ) 250 MG tablet Take 1 tablet (250 mg total) by mouth daily. Take first 2 tablets together, then 1 every day until finished. 07/25/24  Yes Leath-Warren, Etta PARAS, NP  buPROPion  (WELLBUTRIN  SR) 150 MG 12 hr tablet TAKE ONE TABLET BY MOUTH TWICE A DAY 06/02/21  Yes Gosrani, Nimish C, MD  Cholecalciferol (VITAMIN D3) 125 MCG (5000 UT) TABS Take 5,000 Units by mouth daily at 12 noon.   Yes [provider]  Coenzyme Q10 100 MG capsule Take 100 mg by mouth daily at 12 noon.   Yes  [provider]  diazepam  (VALIUM ) 5 MG tablet Take 1 tablet (5 mg total) by mouth daily as needed for anxiety or muscle spasms. 02/03/24  Yes Sater, Charlie LABOR, MD  diazepam  (VALIUM ) 5 MG tablet Take 1 tablet (5 mg total) by mouth 2 (two) times daily as needed for muscle spasms. 05/14/24  Yes Dean Clarity, MD  docusate sodium (COLACE) 100 MG capsule Take 100 mg by mouth 2 (two) times daily.    Yes [provider]  estradiol  (ESTRACE ) 1 MG tablet Take 1 mg by mouth daily.   Yes [provider]  oxyCODONE -acetaminophen  (PERCOCET/ROXICET) 5-325 MG tablet TAKE 1 TABLET DAILY AS NEEDED FOR SEVERE PAIN. 06/22/24  Yes Sater, Charlie LABOR, MD  progesterone  (PROMETRIUM ) 200 MG capsule Take 1 capsule (200 mg total) by mouth daily. 02/21/21  Yes Gosrani, Nimish C, MD  warfarin (COUMADIN ) 5 MG tablet TAKE 1 TO 1 &  1/2 (ONE & ONE-HALF) TABLETS BY MOUTH ONCE DAILY AS DIRECTED BY COUMADIN  CLINIC 07/14/24  Yes Debera Jayson MATSU, MD  doxycycline  (VIBRA -TABS) 100 MG tablet Take 1 tablet (100 mg total) by mouth 2 (two) times daily. Patient not taking: No sig reported 03/10/24   Tobie Suzzane POUR, MD    Family History Family History  Problem Relation Age of Onset   COPD Mother    Hearing loss Mother    Stroke Mother    Heart disease Mother        chf, died at 58   Vision loss Mother    Varicose Veins Mother    Arthritis Father    Hypertension Father    Heart disease Father        cad, pvd, died at 41   Vision loss Father    Heart disease Sister        heart valve   Arthritis Sister    Depression Sister    Hyperlipidemia Brother    Hypertension Brother    Cancer - Prostate Brother     Social History Social History   Tobacco Use   Smoking status: Former    Current packs/day: 0.00    Average packs/day: 1 pack/day for 42.8 years (42.8 ttl pk-yrs)    Types: Cigarettes    Start date: 11/12/1965    Quit date: 09/06/2008    Years since quitting: 15.8   Smokeless tobacco: Never   Vaping Use   Vaping status: Never Used  Substance Use Topics   Alcohol use: Yes    Alcohol/week: 2.0 standard drinks of alcohol    Types: 1 Glasses of wine, 1 Shots of liquor per week    Comment: occasional   Drug use: Yes    Types: Hydrocodone, Marijuana    Comment: prescribed Percocet for myofacial pain syndrome     Allergies   Statins, Other, and Sulfa antibiotics   Review of Systems Review of Systems Per HPI    BP 07/25/24 1518 (!) 147/81     Girls Systolic BP Percentile --      Girls Diastolic BP Percentile --      Boys Systolic BP Percentile --      Boys Diastolic BP Percentile --      Pulse Rate 07/25/24 1518 63     Resp 07/25/24 1518 18     Temp 07/25/24 1518 98 F (36.7 C)     Temp Source 07/25/24 1518 Oral     SpO2 07/25/24 1518 97 %     Weight --      Height --      Head Circumference --      Peak Flow --      Pain Score 07/25/24 1521 0     Pain Loc --      Pain Education --      Exclude from Growth Chart --    No data found.  Updated Vital Signs BP (!) 147/81 (BP Location: Left Arm)   Pulse 63   Temp 98 F (36.7 C) (Oral)   Resp 18   LMP 07/08/1979 (Approximate)   SpO2 97%   Visual Acuity Right Eye Distance:   Left Eye Distance:   Bilateral Distance:    Right Eye Near:   Left Eye Near:    Bilateral Near:     Physical Exam Vitals and nursing note reviewed.  Constitutional:      General: She is not in acute distress.    Appearance: Normal appearance.  HENT:     Head: Normocephalic.     Right Ear: Tympanic membrane, ear canal and external ear normal.     Left Ear: Tympanic membrane, ear canal and external ear normal.     Nose: Congestion present.     Right Turbinates: Enlarged and swollen.     Left Turbinates: Enlarged and swollen.     Mouth/Throat:     Lips: Pink.     Mouth: Mucous membranes are moist.     Pharynx: Postnasal drip present. No pharyngeal swelling, oropharyngeal exudate, posterior oropharyngeal erythema or uvula  swelling.     Comments: Cobblestoning present to posterior oropharynx  Eyes:     Extraocular Movements: Extraocular movements intact.     Conjunctiva/sclera: Conjunctivae normal.     Pupils: Pupils are equal, round, and reactive to light.  Cardiovascular:     Rate and Rhythm: Normal rate and regular rhythm.     Pulses: Normal pulses.     Heart sounds: Normal heart sounds.  Pulmonary:     Effort: Pulmonary effort is normal. No respiratory distress.     Breath sounds: Normal breath sounds. No stridor. No wheezing, rhonchi or rales.  Abdominal:     General: Bowel sounds are normal.     Palpations: Abdomen is soft.     Tenderness: There is no abdominal tenderness.  Musculoskeletal:     Cervical back: Normal range of motion.  Lymphadenopathy:     Cervical: No cervical adenopathy.  Skin:    General: Skin is warm and dry.  Neurological:     General: No focal deficit present.     Mental Status: She is alert and oriented to person, place, and time.  Psychiatric:        Mood and Affect: Mood normal.        Behavior: Behavior normal.      UC Treatments / Results  Labs (all labs ordered are listed, but only abnormal results are displayed) Labs Reviewed - No data to display  EKG   Radiology No results found.  Procedures Procedures (including critical care time)  Medications Ordered in UC Medications - No data to display  Initial Impression / Assessment and Plan / UC Course  I have reviewed the triage vital signs and the nursing notes.  Pertinent labs & imaging results that were available during my care of the patient were reviewed by me and considered in my medical decision making (see chart for details).   Patient presents with 11-day history of generalized fatigue, chest congestion, and phlegm.  On exam, lung sounds are clear throughout, room air sats at 97%.  Patient does appear fatigued, but in no acute distress.  Given the duration of the patient's symptoms, we will  treat empirically with azithromycin  250 mg for a lower respiratory infection.  Supportive care recommendations were provided and discussed with the patient to include fluids, rest, and over-the-counter analgesics.  Patient was advised that if symptoms fail to improve, or if she continues to experience fatigue, recommend follow-up with her PCP to check her thyroid  levels.  Patient was in agreement with this plan of care and verbalizes understanding.  All questions were answered.  Patient stable for discharge.  Final Clinical Impressions(s) / UC Diagnoses   Final diagnoses:  Lower respiratory infection  Other fatigue     Discharge Instructions      Take medication as prescribed. Increase fluids and allow for plenty of rest. Continue use of normal saline nasal spray for nasal congestion or  runny nose. For your chest congestion, you may find it helpful to sleep elevated at nighttime during sleep or to use a humidifier. As discussed, if the medication prescribed today does not help your symptoms and you are continue to experience fatigue, please follow-up with your primary care physician to determine whether or not follow-up regarding your thyroid  is indicated. Follow-up as needed.      ED Prescriptions     Medication Sig Dispense Auth. Provider   azithromycin  (ZITHROMAX ) 250 MG tablet Take 1 tablet (250 mg total) by mouth daily. Take first 2 tablets together, then 1 every day until finished. 6 tablet Leath-Warren, Etta PARAS, NP      PDMP not reviewed this encounter.   Gilmer Etta PARAS, NP 07/25/24 [phone]

## 2024-07-28 ENCOUNTER — Other Ambulatory Visit: Payer: Self-pay | Admitting: *Deleted

## 2024-07-28 ENCOUNTER — Other Ambulatory Visit (HOSPITAL_COMMUNITY): Payer: Self-pay

## 2024-07-28 ENCOUNTER — Encounter: Payer: Self-pay | Admitting: Neurology

## 2024-07-28 ENCOUNTER — Other Ambulatory Visit (HOSPITAL_BASED_OUTPATIENT_CLINIC_OR_DEPARTMENT_OTHER): Payer: Self-pay

## 2024-07-28 ENCOUNTER — Other Ambulatory Visit: Payer: Self-pay | Admitting: Neurology

## 2024-07-28 DIAGNOSIS — Z23 Encounter for immunization: Secondary | ICD-10-CM | POA: Diagnosis not present

## 2024-07-28 MED ORDER — CYCLOBENZAPRINE HCL 5 MG PO TABS: 5.0000 mg | ORAL_TABLET | Freq: Every day | ORAL | 5 refills | Status: AC | PRN

## 2024-07-28 MED ORDER — OXYCODONE-ACETAMINOPHEN 5-325 MG PO TABS: ORAL_TABLET | ORAL | 0 refills | Status: DC

## 2024-07-28 MED ORDER — DIAZEPAM 5 MG PO TABS: 5.0000 mg | ORAL_TABLET | Freq: Every day | ORAL | 3 refills | Status: AC | PRN

## 2024-07-28 MED ORDER — FLUZONE HIGH-DOSE 0.5 ML IM SUSY
0.5000 mL | PREFILLED_SYRINGE | Freq: Once | INTRAMUSCULAR | 0 refills | Status: AC
Start: 2024-07-28 — End: 2024-07-29
  Filled 2024-07-28 (×3): qty 0.5, 1d supply, fill #0

## 2024-07-28 NOTE — Telephone Encounter (Signed)
 Pt sent mychart asking for refills on Percocet and diazepam   Last seen 07/06/24 and next f/u scheduled for 10/12/24.   Last refilled Percocet 06/22/24 #30 and diazepam  06/19/24 #30.

## 2024-07-30 ENCOUNTER — Encounter: Payer: Self-pay | Admitting: Internal Medicine

## 2024-08-07 ENCOUNTER — Encounter: Payer: Self-pay | Admitting: Neurology

## 2024-08-12 ENCOUNTER — Ambulatory Visit: Admitting: Cardiology

## 2024-09-07 ENCOUNTER — Encounter: Payer: Self-pay | Admitting: Cardiology

## 2024-09-07 ENCOUNTER — Ambulatory Visit (INDEPENDENT_AMBULATORY_CARE_PROVIDER_SITE_OTHER): Admitting: *Deleted

## 2024-09-07 ENCOUNTER — Ambulatory Visit: Attending: Cardiology | Admitting: Cardiology

## 2024-09-07 ENCOUNTER — Other Ambulatory Visit (HOSPITAL_BASED_OUTPATIENT_CLINIC_OR_DEPARTMENT_OTHER): Payer: Self-pay

## 2024-09-07 VITALS — BP 122/80 | HR 56 | Ht 64.0 in | Wt 155.0 lb

## 2024-09-07 DIAGNOSIS — E7849 Other hyperlipidemia: Secondary | ICD-10-CM | POA: Diagnosis not present

## 2024-09-07 DIAGNOSIS — Z5181 Encounter for therapeutic drug level monitoring: Secondary | ICD-10-CM

## 2024-09-07 DIAGNOSIS — R030 Elevated blood-pressure reading, without diagnosis of hypertension: Secondary | ICD-10-CM | POA: Diagnosis not present

## 2024-09-07 DIAGNOSIS — I480 Paroxysmal atrial fibrillation: Secondary | ICD-10-CM

## 2024-09-07 LAB — POCT INR: INR: 2.3 (ref 2.0–3.0)

## 2024-09-07 MED ORDER — ESTRADIOL 1 MG PO TABS
1.0000 mg | ORAL_TABLET | Freq: Every day | ORAL | 3 refills | Status: AC
  Filled 2024-09-07: qty 90, 90d supply, fill #0

## 2024-09-07 NOTE — Patient Instructions (Addendum)

## 2024-09-07 NOTE — Progress Notes (Signed)
 INR-2.3; Please see anticoagulation encounter

## 2024-09-07 NOTE — Patient Instructions (Signed)
 Continue warfarin 1 1/2 tablets daily Recheck INR in 7 wks.

## 2024-09-07 NOTE — Progress Notes (Signed)
    Cardiology Office Note  Date: 09/07/2024   ID: Heather [name], DOB November 25, 1945, MRN [id number]  History of Present Illness: Heather [name] is a 78 y.o. female last seen in March.  She is here for a follow-up visit.  Reports no new exertional symptoms or change in stamina.  She traveled to [place] with her sisters for a family trip.  No trouble getting around in terms of functional limitations.  She does not report any progressive sense of palpitations.  She continues on Coumadin  with follow-up in the anticoagulation clinic.  She does not report any spontaneous bleeding problems, no stool changes.  She continues to track blood pressure at home, does have whitecoat hypertension.  I rechecked her blood pressure today at 122/80.  She is not on antihypertensive therapy at this point.  Physical Exam: VS:  BP 122/80 (BP Location: Right Arm)   Pulse (!) 56   Ht 5' 4 (1.626 m)   Wt 155 lb (70.3 kg) Comment: reported weight/refused to weigh in office  LMP 07/08/1979 (Approximate)   SpO2 98%   BMI 26.61 kg/m , BMI Body mass index is 26.61 kg/m.  Wt Readings from Last 3 Encounters:  09/07/24 155 lb (70.3 kg)  05/14/24 149 lb 14.6 oz (68 kg)  02/03/24 150 lb (68 kg)    General: Patient appears comfortable at rest. HEENT: Conjunctiva and lids normal. Neck: Supple, no elevated JVP or carotid bruits. Lungs: Clear to auscultation, nonlabored breathing at rest. Cardiac: Regular rate and rhythm, no S3 or significant systolic murmur. Extremities: No pitting edema.  ECG:  An ECG dated 02/03/2024 was personally reviewed today and demonstrated:  Sinus rhythm with lead motion artifact.  Labwork: 10/30/2023: ALT 14; AST 18; TSH 3.240 05/14/2024: BUN 17; Creatinine, Ser 0.69; Hemoglobin 12.2; Platelets 216; Potassium 4.7; Sodium 136     Component Value Date/Time   CHOL 299 (H) 10/30/2023 0943   TRIG 174 (H) 10/30/2023 0943   HDL 60 10/30/2023 0943   CHOLHDL 5.0 (H) 10/30/2023 0943   CHOLHDL  4.6 04/05/2021 1414   LDLCALC 206 (H) 10/30/2023 0943   LDLCALC 169 (H) 04/05/2021 1414   Other Studies Reviewed Today:  No interval cardiac testing for review today.  Assessment and Plan:  1.  Nonobstructive CAD documented at cardiac catheterization in 2022.  No angina or change in functional status.   2.  Paroxysmal atrial fibrillation with CHA2DS2-VASc score of 4.  She does not describe any increasing palpitations, heart rate is regular today.  Continue Coumadin  for stroke prophylaxis, she follows in the anticoagulation clinic.  Reports no spontaneous bleeding problems.   3.  Primary hypertension, also whitecoat hypertension.  Blood pressure rechecked by me at 122/80 today.  She is currently not on any antihypertensives.   4.  Familial hyperlipidemia.  She has a history of statin intolerance and ultimately stopped Leqvio .  LDL up to 206 in December 7975.  We have discussed other options and at this point she prefers to hold off on further pharmacologic therapies.   5.  History of stress-induced cardiomyopathy.  LVEF 55 to 60% without regional wall motion abnormalities by follow-up echocardiogram in July 2022.  Disposition:  Follow up 1 year.  Signed, Jayson JUDITHANN Sierras, M.D., F.A.C.C. [facility]

## 2024-09-28 ENCOUNTER — Other Ambulatory Visit (HOSPITAL_BASED_OUTPATIENT_CLINIC_OR_DEPARTMENT_OTHER): Payer: Self-pay

## 2024-09-28 MED ORDER — BUPROPION HCL ER (SR) 150 MG PO TB12
150.0000 mg | ORAL_TABLET | Freq: Two times a day (BID) | ORAL | 3 refills | Status: AC
  Filled 2024-09-28: qty 180, 90d supply, fill #0

## 2024-10-01 ENCOUNTER — Telehealth: Payer: Self-pay | Admitting: Neurology

## 2024-10-01 ENCOUNTER — Other Ambulatory Visit (HOSPITAL_BASED_OUTPATIENT_CLINIC_OR_DEPARTMENT_OTHER): Payer: Self-pay

## 2024-10-01 NOTE — Telephone Encounter (Signed)
 Received Teams message from phone room staff that pt cancelled her 12/1 Botox  appt and wants to go without it for awhile.

## 2024-10-02 ENCOUNTER — Other Ambulatory Visit (HOSPITAL_BASED_OUTPATIENT_CLINIC_OR_DEPARTMENT_OTHER): Payer: Self-pay

## 2024-10-12 ENCOUNTER — Other Ambulatory Visit (HOSPITAL_BASED_OUTPATIENT_CLINIC_OR_DEPARTMENT_OTHER): Payer: Self-pay

## 2024-10-12 ENCOUNTER — Ambulatory Visit: Admitting: Neurology

## 2024-10-14 ENCOUNTER — Telehealth: Payer: Self-pay | Admitting: Cardiology

## 2024-10-14 DIAGNOSIS — I480 Paroxysmal atrial fibrillation: Secondary | ICD-10-CM

## 2024-10-14 MED ORDER — WARFARIN SODIUM 5 MG PO TABS
ORAL_TABLET | ORAL | 1 refills | Status: AC
  Filled 2024-10-22: qty 135, 90d supply, fill #0

## 2024-10-14 NOTE — Telephone Encounter (Signed)
 Refill request for warfarin:  Last INR was 2.3 on 09/07/24 Next INR due 10/26/24 LOV was 09/07/24  Refill approved.

## 2024-10-14 NOTE — Telephone Encounter (Signed)
 1. Which medications need to be refilled? (please list name of each medication and dose if known) Coumadin  5mg   2. Which pharmacy/location (including street and city if local pharmacy) is medication to be sent to? [facility] in [place]  3. Do they need a 30 day or 90 day supply? 90  Pt only has a few pills left.

## 2024-10-16 ENCOUNTER — Other Ambulatory Visit (HOSPITAL_BASED_OUTPATIENT_CLINIC_OR_DEPARTMENT_OTHER): Payer: Self-pay

## 2024-10-19 ENCOUNTER — Other Ambulatory Visit (HOSPITAL_BASED_OUTPATIENT_CLINIC_OR_DEPARTMENT_OTHER): Payer: Self-pay

## 2024-10-22 ENCOUNTER — Encounter: Payer: Self-pay | Admitting: Cardiology

## 2024-10-22 ENCOUNTER — Encounter: Payer: Self-pay | Admitting: *Deleted

## 2024-10-22 ENCOUNTER — Other Ambulatory Visit (HOSPITAL_BASED_OUTPATIENT_CLINIC_OR_DEPARTMENT_OTHER): Payer: Self-pay

## 2024-10-22 NOTE — Telephone Encounter (Signed)
 This encounter was created in error - please disregard.

## 2024-10-23 ENCOUNTER — Ambulatory Visit: Admitting: Podiatry

## 2024-10-23 DIAGNOSIS — M205X1 Other deformities of toe(s) (acquired), right foot: Secondary | ICD-10-CM | POA: Diagnosis not present

## 2024-10-23 NOTE — Progress Notes (Signed)
°  Subjective:  Patient ID: Heather [name], female    DOB: 1946/01/31,  MRN: [id number]  Chief Complaint  Patient presents with   Nail Problem    Painful nails possible ingrown    78 y.o. female returns today for planned flexor tenotomy of the Right fourth digit.  Objective:  There were no vitals filed for this visit.  General AA&O x3. Normal mood and affect.  Vascular Pedal pulses palpable.  Neurologic Epicritic sensation grossly intact.  Dermatologic Pre-ulcerative callus at the tip of the right, 4th toe  Orthopedic: Semi-reducible hammertoe deformity right, 4th toe    Assessment & Plan:  Patient was evaluated and treated and all questions answered.  Hammertoe right fourth with pre-ulcerative callus -Flexor tenotomy as below. -Advised to remove the dressing in 24 hours and apply a band-aid and triple abx ointment every day thereafter.  Procedure: Flexor Tenotomy Indication for Procedure: toe with semi-reducible hammertoe with distal tip ulceration. Flexor tenotomy indicated to alleviate contracture, reduce pressure, and enhance healing of the ulceration. Location: right, 4th toe Anesthesia: Lidocaine  1% plain; 1.5 mL and Marcaine 0.5% plain; 1.5 mL digital block Instrumentation: 18 g needle  Technique: The toe was anesthetized as above and prepped in the usual fashion. The toe was exsanquinated and a tourniquet was secured at the base of the toe. An 18g needle was then used to percutaneously release the flexor tendon at the plantar surface of the toe with noted release of the hammertoe deformity. The incision was then dressed with antibiotic ointment and band-aid. Compression splint dressing applied. Patient tolerated the procedure well. Dressing: Dry, sterile, compression dressing. Disposition: Patient tolerated procedure well. Patient to return in 1 week for follow-up.      No follow-ups on file.

## 2024-10-26 ENCOUNTER — Ambulatory Visit

## 2024-10-29 ENCOUNTER — Ambulatory Visit: Attending: Cardiology

## 2024-10-29 ENCOUNTER — Ambulatory Visit

## 2024-10-29 DIAGNOSIS — Z5181 Encounter for therapeutic drug level monitoring: Secondary | ICD-10-CM | POA: Insufficient documentation

## 2024-10-29 DIAGNOSIS — L821 Other seborrheic keratosis: Secondary | ICD-10-CM | POA: Diagnosis not present

## 2024-10-29 DIAGNOSIS — I480 Paroxysmal atrial fibrillation: Secondary | ICD-10-CM | POA: Insufficient documentation

## 2024-10-29 LAB — POCT INR: INR: 3.7 — AB (ref 2.0–3.0)

## 2024-10-29 NOTE — Progress Notes (Signed)
 INR 3.7 Please see anticoagulation encounter

## 2024-10-29 NOTE — Patient Instructions (Signed)
 Hold warfarin tonight then resume 1 1/2 tablets daily Recheck INR in 7 wks.

## 2024-11-01 ENCOUNTER — Emergency Department (HOSPITAL_COMMUNITY)
Admission: EM | Admit: 2024-11-01 | Discharge: 2024-11-01 | Disposition: A | Discharge status: Home / Self Care | Source: Home / Self Care | Attending: Emergency Medicine | Admitting: Emergency Medicine

## 2024-11-01 ENCOUNTER — Encounter (HOSPITAL_COMMUNITY): Payer: Self-pay

## 2024-11-01 ENCOUNTER — Emergency Department (HOSPITAL_COMMUNITY): Admission: EM | Admit: 2024-11-01 | Discharge: 2024-11-01 | Disposition: A | Discharge status: Home / Self Care

## 2024-11-01 ENCOUNTER — Encounter (HOSPITAL_COMMUNITY): Payer: Self-pay | Admitting: Emergency Medicine

## 2024-11-01 ENCOUNTER — Other Ambulatory Visit: Payer: Self-pay

## 2024-11-01 ENCOUNTER — Emergency Department (HOSPITAL_COMMUNITY)

## 2024-11-01 DIAGNOSIS — X500XXA Overexertion from strenuous movement or load, initial encounter: Secondary | ICD-10-CM | POA: Diagnosis not present

## 2024-11-01 DIAGNOSIS — M549 Dorsalgia, unspecified: Secondary | ICD-10-CM | POA: Diagnosis present

## 2024-11-01 DIAGNOSIS — M62830 Muscle spasm of back: Secondary | ICD-10-CM | POA: Insufficient documentation

## 2024-11-01 DIAGNOSIS — I10 Essential (primary) hypertension: Secondary | ICD-10-CM | POA: Insufficient documentation

## 2024-11-01 DIAGNOSIS — I2510 Atherosclerotic heart disease of native coronary artery without angina pectoris: Secondary | ICD-10-CM | POA: Insufficient documentation

## 2024-11-01 DIAGNOSIS — Y990 Civilian activity done for income or pay: Secondary | ICD-10-CM | POA: Diagnosis not present

## 2024-11-01 DIAGNOSIS — Z7901 Long term (current) use of anticoagulants: Secondary | ICD-10-CM | POA: Diagnosis not present

## 2024-11-01 MED ORDER — DIAZEPAM 2 MG PO TABS
2.0000 mg | ORAL_TABLET | Freq: Once | ORAL | Status: AC
  Administered 2024-11-01: 2 mg via ORAL
  Filled 2024-11-01: qty 1

## 2024-11-01 MED ORDER — MORPHINE SULFATE (PF) 4 MG/ML IV SOLN
2.0000 mg | Freq: Once | INTRAVENOUS | Status: AC
  Administered 2024-11-01: 2 mg via INTRAMUSCULAR
  Filled 2024-11-01: qty 1

## 2024-11-01 MED ORDER — MORPHINE SULFATE (PF) 4 MG/ML IV SOLN
4.0000 mg | Freq: Once | INTRAVENOUS | Status: AC
  Administered 2024-11-01: 4 mg via INTRAVENOUS
  Filled 2024-11-01: qty 1

## 2024-11-01 MED ORDER — ONDANSETRON HCL 4 MG/2ML IJ SOLN
4.0000 mg | Freq: Once | INTRAMUSCULAR | Status: AC
  Administered 2024-11-01: 4 mg via INTRAVENOUS
  Filled 2024-11-01: qty 2

## 2024-11-01 MED ORDER — PREDNISONE 10 MG PO TABS: ORAL_TABLET | ORAL | 0 refills | Status: AC

## 2024-11-01 NOTE — Discharge Instructions (Signed)
 You may alternate ice and heat to your back.  You have been prescribed prednisone  please take as directed.  Follow-up with your primary care provider or your neurologist for recheck.

## 2024-11-01 NOTE — ED Notes (Signed)
 Discharge instructions reviewed with patient. Patient questions answered and opportunity for education reviewed. Patient voices understanding of discharge instructions with no further questions. Patient ambulatory with steady gait to lobby.

## 2024-11-01 NOTE — ED Notes (Signed)
 Pt requesting a pillow to sit on while pt sits in the floor with back up against the wall. Pt states this is the only way to get relief

## 2024-11-01 NOTE — ED Triage Notes (Signed)
 Pt has had back spasms for 3 days, pt has tried different pain medication with no relief, pt had the same thing happen over the summer.

## 2024-11-01 NOTE — Discharge Instructions (Signed)
 You may apply ice packs on and off to the affected area of your back.  Please follow-up with your primary care provider for recheck or with your neurologist.  Return to the emergency department for any new or worsening symptoms

## 2024-11-01 NOTE — ED Triage Notes (Signed)
 Pov c/o back spasms. Pt was just dc from this ED for the same. Pt returns as muscle spasm has not been relieved.

## 2024-11-01 NOTE — ED Provider Notes (Signed)
 " [place] EMERGENCY DEPARTMENT AT [facility] Provider Note   CSN: [id number] Arrival date & time: 11/01/24  1203     Patient presents with: Back Pain   Heather [name] is a 78 y.o. female.    Back Pain      Heather [name] is a 78 y.o. female with past medical history of myofascial pain syndrome, Takotsubo syndrome, GAD, CAD, paroxysmal atrial fibs hypertension who presents to the Emergency Department complaining of localized muscle spasm right mid back.  Symptoms present for 1 week.  She states that she has had this to occur multiple times and has received Botox  injections to the muscles of her back but missed her last scheduled appointment.  She takes muscle relaxers and pain medication without relief.  She denies any known injury, but states that she does a lot of lifting and reaching with her job.  She denies any chest pain, shortness of breath, numbness or weakness of her arms abdominal pain, flank pain or dysuria, nausea or vomiting.  Prior to Admission medications  Medication Sig Start Date End Date Taking? Authorizing Provider  azithromycin  (ZITHROMAX ) 250 MG tablet Take 1 tablet (250 mg total) by mouth daily. Take first 2 tablets together, then 1 every day until finished. Patient not taking: Reported on 09/07/2024 07/25/24   Leath-Warren, Etta PARAS, NP  buPROPion  (WELLBUTRIN  SR) 150 MG 12 hr tablet TAKE ONE TABLET BY MOUTH TWICE A DAY Patient taking differently: Take 150 mg by mouth 2 (two) times daily as needed. 06/02/21   Gosrani, Nimish C, MD  buPROPion  (WELLBUTRIN  SR) 150 MG 12 hr tablet Take 1 tablet (150 mg total) by mouth 2 (two) times daily. 11/20/23     Cholecalciferol (VITAMIN D3) 125 MCG (5000 UT) TABS Take 5,000 Units by mouth daily at 12 noon.    [provider]  Coenzyme Q10 100 MG capsule Take 100 mg by mouth daily at 12 noon.    [provider]  cyclobenzaprine  (FLEXERIL ) 5 MG tablet Take 1 tablet (5 mg total) by mouth daily as needed for  muscle spasms. 07/28/24   Sater, Charlie LABOR, MD  diazepam  (VALIUM ) 5 MG tablet Take 1 tablet (5 mg total) by mouth daily as needed for anxiety or muscle spasms. 07/28/24   Sater, Charlie LABOR, MD  docusate sodium (COLACE) 100 MG capsule Take 100 mg by mouth 2 (two) times daily.     [provider]  doxycycline  (VIBRA -TABS) 100 MG tablet Take 1 tablet (100 mg total) by mouth 2 (two) times daily. Patient not taking: Reported on 09/07/2024 03/10/24   Tobie Suzzane POUR, MD  estradiol  (ESTRACE ) 1 MG tablet Take 1 mg by mouth daily.    [provider]  estradiol  (ESTRACE ) 1 MG tablet Take 1 tablet (1 mg total) by mouth daily. 11/20/23     oxyCODONE -acetaminophen  (PERCOCET/ROXICET) 5-325 MG tablet TAKE 1 TABLET DAILY AS NEEDED FOR SEVERE PAIN. 07/28/24   Sater, Charlie LABOR, MD  progesterone  (PROMETRIUM ) 200 MG capsule Take 1 capsule (200 mg total) by mouth daily. 02/21/21   Gosrani, Nimish C, MD  warfarin (COUMADIN ) 5 MG tablet TAKE 1 TO 1 & 1/2 (ONE & ONE-HALF) TABLETS BY MOUTH ONCE DAILY AS DIRECTED BY COUMADIN  CLINIC 10/14/24   Debera Jayson MATSU, MD    Allergies: Statins, Other, and Sulfa antibiotics    Review of Systems  Musculoskeletal:  Positive for back pain.  All other systems reviewed and are negative.   Updated Vital Signs BP (!) 143/65 (  BP Location: Left Arm)   Pulse 72   Temp 98.2 F (36.8 C)   Resp 18   Ht 5' 4 (1.626 m)   Wt 63.5 kg   LMP 07/08/1979   SpO2 98%   BMI 24.03 kg/m   Physical Exam Vitals and nursing note reviewed.  Constitutional:      Appearance: Normal appearance. She is not ill-appearing or toxic-appearing.  Cardiovascular:     Rate and Rhythm: Normal rate and regular rhythm.     Pulses: Normal pulses.  Pulmonary:     Effort: Pulmonary effort is normal.  Chest:     Chest wall: No tenderness.  Abdominal:     General: There is no distension.     Palpations: Abdomen is soft.     Tenderness: There is no abdominal tenderness. There is no right CVA  tenderness or left CVA tenderness.  Musculoskeletal:        General: Tenderness present. No swelling.       Arms:     Comments: Very localized ttp of the right subscapular region.  Muscle spasm present.  No bony deformity, no skin changes of the area.   Skin:    General: Skin is warm.     Capillary Refill: Capillary refill takes less than 2 seconds.     Findings: No erythema or rash.  Neurological:     General: No focal deficit present.     Mental Status: She is alert.     Sensory: No sensory deficit.     Motor: No weakness.     (all labs ordered are listed, but only abnormal results are displayed) Labs Reviewed - No data to display  EKG: None  Radiology: No results found.   Procedures   Medications Ordered in the ED  morphine  (PF) 4 MG/ML injection 4 mg (4 mg Intravenous Given 11/01/24 1500)  ondansetron  (ZOFRAN ) injection 4 mg (4 mg Intravenous Given 11/01/24 1459)                                    Medical Decision Making   Pt here with very localized pain of the right subscapular region.  She endorses recurrent pain secondary to myofascial pain syndrome.  She receives Botox  injections routinely but missed her last appt.  Denies new injury, dyspnea and chest pain   On exam, pain is very localized.  Likely muscle spasm.  No skin changes to suggest zoster.  Amount and/or Complexity of Data Reviewed Discussion of management or test interpretation with external provider(s): Chest x-ray with rib detail was ordered but patient refused, stating that she has had this occur multiple times in the past and does not feel that x-ray is beneficial  Pt also seen by Dr. Gennaro and care plan discussed.    Pt is ambulatory in the dept with steady gait.  Feeling better after injection.  Appears appropriate for d/c home.  Will f/u out patient with her neurologist.    Risk Prescription drug management.        Final diagnoses:  Muscle spasm of back    ED Discharge  Orders     None          Herlinda Milling, PA-C 11/03/24 1020    Kammerer, Duwaine L, DO 11/09/24 1536  "

## 2024-11-02 ENCOUNTER — Telehealth: Admitting: Physician Assistant

## 2024-11-02 ENCOUNTER — Telehealth: Payer: Self-pay | Admitting: Neurology

## 2024-11-02 DIAGNOSIS — Z5321 Procedure and treatment not carried out due to patient leaving prior to being seen by health care provider: Secondary | ICD-10-CM

## 2024-11-02 NOTE — Telephone Encounter (Signed)
 I took call from phone staff.  Pt stated that 4 days ago she started with back spasm (taking percocet, heat, TENS).  Went to work thur/Fri had to go to ED. Received morphine  4mg  x1 IV, valium  orally, then another Morphine  IM.  She said that she can't do anything but sit, (work with her hands).  Her work is film/video editor.  She is on prednisone  taper taken 1st day now.  She is taking percocet 4-6 hrs, flexeril  5mg  po daily as needed, cold/hot.  I consulted Dr. Gregg STALLING he stated that pt can take flexeril  10mg  TID prn. She needs to get back on schedule for BOTOX . I spoke to pt and she will take 10mg  po tid (2 of the 5mg  tabs) she has refill and will see how she does and let us  know tomorrow.   She appreciated call back. She works as a comptroller. I told her will relay about getting her botox  restarted.

## 2024-11-02 NOTE — Telephone Encounter (Signed)
 Pt reporting to have bad back spasms, after 3 days she went to ED, given pain meds and back pain still bad.  Pt would like a call to discuss possibility of needing Botox  again

## 2024-11-02 NOTE — Progress Notes (Signed)
 Patient arrived at the scheduled 1445 appt time, but immediately logged off. The provider was logged in right after the initial arrival of the patient, but patient had logged off just as provider logged in.   Provider sent a link to email and phone at 1448 and then again to phone at 1450.  Provider awaited until 1456 and patient made no further attempts to log back on.   Patient was seen in ER yesterday for same, so possibly had been scheduled prior to the ER and possibly felt unnecessary as well.   Will mark Left without being seen and No Charge.

## 2024-11-03 ENCOUNTER — Telehealth: Admitting: Physician Assistant

## 2024-11-03 ENCOUNTER — Encounter: Payer: Self-pay | Admitting: Physician Assistant

## 2024-11-03 DIAGNOSIS — M62830 Muscle spasm of back: Secondary | ICD-10-CM

## 2024-11-03 MED ORDER — ORPHENADRINE CITRATE ER 100 MG PO TB12: 100.0000 mg | ORAL_TABLET | Freq: Two times a day (BID) | ORAL | 0 refills | Status: DC

## 2024-11-03 NOTE — Patient Instructions (Signed)
 " Ronnald Lame, thank you for joining Herberta Pickron, PA-C for today's virtual visit.  While this provider is not your primary care provider (PCP), if your PCP is located in our provider database this encounter information will be shared with them immediately following your visit.   A [facility] MyChart account gives you access to today's visit and all your visits, tests, and labs performed at [facility]  click here if you don't have a [place] [facility] MyChart account or go to [email or web address]  Consent: (Patient) Heather [name] provided verbal consent for this virtual visit at the beginning of the encounter.  Current Medications:  Current Outpatient Medications:    orphenadrine  (NORFLEX ) 100 MG tablet, Take 1 tablet (100 mg total) by mouth 2 (two) times daily for 7 days., Disp: 14 tablet, Rfl: 0   azithromycin  (ZITHROMAX ) 250 MG tablet, Take 1 tablet (250 mg total) by mouth daily. Take first 2 tablets together, then 1 every day until finished. (Patient not taking: Reported on 09/07/2024), Disp: 6 tablet, Rfl: 0   buPROPion  (WELLBUTRIN  SR) 150 MG 12 hr tablet, TAKE ONE TABLET BY MOUTH TWICE A DAY (Patient taking differently: Take 150 mg by mouth 2 (two) times daily as needed.), Disp: 180 tablet, Rfl: 1   buPROPion  (WELLBUTRIN  SR) 150 MG 12 hr tablet, Take 1 tablet (150 mg total) by mouth 2 (two) times daily., Disp: 180 tablet, Rfl: 3   Cholecalciferol (VITAMIN D3) 125 MCG (5000 UT) TABS, Take 5,000 Units by mouth daily at 12 noon., Disp: , Rfl:    Coenzyme Q10 100 MG capsule, Take 100 mg by mouth daily at 12 noon., Disp: , Rfl:    cyclobenzaprine  (FLEXERIL ) 5 MG tablet, Take 1 tablet (5 mg total) by mouth daily as needed for muscle spasms., Disp: 30 tablet, Rfl: 5   diazepam  (VALIUM ) 5 MG tablet, Take 1 tablet (5 mg total) by mouth daily as needed for anxiety or muscle spasms., Disp: 30 tablet, Rfl: 3   docusate sodium (COLACE) 100 MG capsule, Take 100 mg by mouth 2 (two) times daily.  , Disp: , Rfl:    doxycycline  (VIBRA -TABS) 100 MG tablet, Take 1 tablet (100 mg total) by mouth 2 (two) times daily. (Patient not taking: Reported on 09/07/2024), Disp: 14 tablet, Rfl: 0   estradiol  (ESTRACE ) 1 MG tablet, Take 1 mg by mouth daily., Disp: , Rfl:    estradiol  (ESTRACE ) 1 MG tablet, Take 1 tablet (1 mg total) by mouth daily., Disp: 90 tablet, Rfl: 3   oxyCODONE -acetaminophen  (PERCOCET/ROXICET) 5-325 MG tablet, TAKE 1 TABLET DAILY AS NEEDED FOR SEVERE PAIN., Disp: 30 tablet, Rfl: 0   predniSONE  (DELTASONE ) 10 MG tablet, Take 6 tablets day one, 5 tablets day two, 4 tablets day three, 3 tablets day four, 2 tablets day five, then 1 tablet day six, Disp: 21 tablet, Rfl: 0   progesterone  (PROMETRIUM ) 200 MG capsule, Take 1 capsule (200 mg total) by mouth daily., Disp: 90 capsule, Rfl: 1   warfarin (COUMADIN ) 5 MG tablet, TAKE 1 TO 1 & 1/2 (ONE & ONE-HALF) TABLETS BY MOUTH ONCE DAILY AS DIRECTED BY COUMADIN  CLINIC, Disp: 135 tablet, Rfl: 1   Medications ordered in this encounter:  Meds ordered this encounter  Medications   orphenadrine  (NORFLEX ) 100 MG tablet    Sig: Take 1 tablet (100 mg total) by mouth 2 (two) times daily for 7 days.    Dispense:  14 tablet    Refill:  0    Supervising Provider:  BLAISE ALEENE KIDD [[id number]]     *If you need refills on other medications prior to your next appointment, please contact your pharmacy*  Follow-Up: Call back or seek an in-person evaluation if the symptoms worsen or if the condition fails to improve as anticipated.  [facility] [phone]  Other Instructions    If you have been instructed to have an in-person evaluation today at a local Urgent Care facility, please use the link below. It will take you to a list of all of our available [facility], including address, phone number and hours of operation. Please do not delay care.  [facility]  If you or a family member do not have a primary  care provider, use the link below to schedule a visit and establish care. When you choose a [facility] primary care physician or advanced practice provider, you gain a long-term partner in health. Find a Primary Care Provider  Learn more about [facility]'s in-office and virtual care options: [facility] - Get Care Now  "

## 2024-11-03 NOTE — Telephone Encounter (Addendum)
 Patient asking for a earlier appointment than August 2026 due to severe back pain. Would like a call back. Today spoke with Primary care prescribe a muscle relaxer,Norflex  twice a day and advise start Gabapentin  that prescribed ( still have an old prescription)

## 2024-11-03 NOTE — ED Provider Notes (Signed)
 " [place] EMERGENCY DEPARTMENT AT [facility] Provider Note   CSN: [id number] Arrival date & time: 11/01/24  1606     Patient presents with: Back Pain   Heather [name] is a 78 y.o. female.    Back Pain      Heather [name] is a 78 y.o. female with history of myofascial pain syndrome and was just treated here by me within the hour for localized pain along her right subscapular area.  This is a recurring pain for her.  She stated the only thing that has helped in the past was morphine  injections.  On her previous visit she was given pain injection and had relief of her symptoms but she states before she got to her car, the pain came back.  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  She denies any pain of her chest, shortness of breath, abdominal pain or radicular symptoms.  She gets routine Botox  injections in the area and recently missed her last injection appointment.  Prior to Admission medications  Medication Sig Start Date End Date Taking? Authorizing Provider  predniSONE  (DELTASONE ) 10 MG tablet Take 6 tablets day one, 5 tablets day two, 4 tablets day three, 3 tablets day four, 2 tablets day five, then 1 tablet day six 11/01/24  Yes Jhan Conery, PA-C  azithromycin  (ZITHROMAX ) 250 MG tablet Take 1 tablet (250 mg total) by mouth daily. Take first 2 tablets together, then 1 every day until finished. Patient not taking: Reported on 09/07/2024 07/25/24   Leath-Warren, Etta PARAS, NP  buPROPion  (WELLBUTRIN  SR) 150 MG 12 hr tablet TAKE ONE TABLET BY MOUTH TWICE A DAY Patient taking differently: Take 150 mg by mouth 2 (two) times daily as needed. 06/02/21   Gosrani, Nimish C, MD  buPROPion  (WELLBUTRIN  SR) 150 MG 12 hr tablet Take 1 tablet (150 mg total) by mouth 2 (two) times daily. 11/20/23     Cholecalciferol (VITAMIN D3) 125 MCG (5000 UT) TABS Take 5,000 Units by mouth daily at 12 noon.    [provider]  Coenzyme Q10 100 MG capsule Take 100 mg  by mouth daily at 12 noon.    [provider]  cyclobenzaprine  (FLEXERIL ) 5 MG tablet Take 1 tablet (5 mg total) by mouth daily as needed for muscle spasms. 07/28/24   Sater, Charlie LABOR, MD  diazepam  (VALIUM ) 5 MG tablet Take 1 tablet (5 mg total) by mouth daily as needed for anxiety or muscle spasms. 07/28/24   Sater, Charlie LABOR, MD  docusate sodium (COLACE) 100 MG capsule Take 100 mg by mouth 2 (two) times daily.     [provider]  doxycycline  (VIBRA -TABS) 100 MG tablet Take 1 tablet (100 mg total) by mouth 2 (two) times daily. Patient not taking: Reported on 09/07/2024 03/10/24   Tobie Suzzane POUR, MD  estradiol  (ESTRACE ) 1 MG tablet Take 1 mg by mouth daily.    [provider]  estradiol  (ESTRACE ) 1 MG tablet Take 1 tablet (1 mg total) by mouth daily. 11/20/23     oxyCODONE -acetaminophen  (PERCOCET/ROXICET) 5-325 MG tablet TAKE 1 TABLET DAILY AS NEEDED FOR SEVERE PAIN. 07/28/24   Sater, Charlie LABOR, MD  progesterone  (PROMETRIUM ) 200 MG capsule Take 1 capsule (200 mg total) by mouth daily. 02/21/21   Gosrani, Nimish C, MD  warfarin (COUMADIN ) 5 MG tablet TAKE 1 TO 1 & 1/2 (ONE & ONE-HALF) TABLETS BY MOUTH ONCE DAILY AS DIRECTED BY COUMADIN  CLINIC 10/14/24   Debera,  Jayson MATSU, MD    Allergies: Statins, Other, and Sulfa antibiotics    Review of Systems  Musculoskeletal:  Positive for back pain.  All other systems reviewed and are negative.   Updated Vital Signs BP (!) 145/68   Pulse 69   Temp 98.1 F (36.7 C) (Oral)   Resp 16   Ht 5' 4 (1.626 m)   Wt 63.5 kg   LMP 07/08/1979   SpO2 98%   BMI 24.03 kg/m   Physical Exam Vitals and nursing note reviewed.  Constitutional:      Appearance: Normal appearance.  Cardiovascular:     Rate and Rhythm: Normal rate and regular rhythm.     Pulses: Normal pulses.  Pulmonary:     Effort: Pulmonary effort is normal.  Abdominal:     Palpations: Abdomen is soft.     Tenderness: There is no abdominal tenderness.   Musculoskeletal:        General: Tenderness present.     Comments: Very focal area of tenderness to the right subscapular region.  Positive muscle spasm on exam.  No midline tenderness or bony deformity.  Skin:    General: Skin is warm.     Capillary Refill: Capillary refill takes less than 2 seconds.     Findings: No erythema or rash.  Neurological:     General: No focal deficit present.     Mental Status: She is alert.     Sensory: No sensory deficit.     Motor: No weakness.     (all labs ordered are listed, but only abnormal results are displayed) Labs Reviewed - No data to display  EKG: None  Radiology: No results found.   Procedures   Medications Ordered in the ED  morphine  (PF) 4 MG/ML injection 2 mg (2 mg Intramuscular Given 11/01/24 1728)  diazepam  (VALIUM ) tablet 2 mg (2 mg Oral Given 11/01/24 1727)                                    Medical Decision Making   Patient has history of myofascial pain syndrome receives Botox  injections routinely.  She was just seen here by me for same.  She initially had pain relief after morphine  injection but pain returned before she got to her car so she checked back in.  She has very localized area of tenderness.  There is muscle spasm present.  She is ambulatory in the department and moves all extremities without difficulty.  There is no bony or midline tenderness.  Will try addressing her pain again  Amount and/or Complexity of Data Reviewed Discussion of management or test interpretation with external provider(s):   Patient received IM injection of pain medication and oral Valium .  Ice pack was applied to the affected area.  She has been observed in the department and reports improvement of her pain.  I have witnessed her ambulating in the ED hallway.  Her gait is steady.  She is moving all extremities without reoccurrence of her spasm.  Doubt emergent process.  Feel that she is appropriate for discharge home, she has pain  medication Valium  and muscle relaxers at home.  Prescription for steroid taper and she will follow-up closely outpatient with her neurologist.  Risk Prescription drug management.        Final diagnoses:  Muscle spasm of back    ED Discharge Orders  Ordered    predniSONE  (DELTASONE ) 10 MG tablet        11/01/24 1805               Herlinda Milling, PA-C 11/03/24 1029    Yolande Lamar BROCKS, MD 11/08/24 2036  "

## 2024-11-03 NOTE — Progress Notes (Signed)
 " Virtual Visit Consent   Tranice Laduke, you are scheduled for a virtual visit with a [facility] provider today. Just as with appointments in the office, your consent must be obtained to participate. Your consent will be active for this visit and any virtual visit you may have with one of our providers in the next 365 days. If you have a MyChart account, a copy of this consent can be sent to you electronically.  As this is a virtual visit, video technology does not allow for your provider to perform a traditional examination. This may limit your provider's ability to fully assess your condition. If your provider identifies any concerns that need to be evaluated in person or the need to arrange testing (such as labs, EKG, etc.), we will make arrangements to do so. Although advances in technology are sophisticated, we cannot ensure that it will always work on either your end or our end. If the connection with a video visit is poor, the visit may have to be switched to a telephone visit. With either a video or telephone visit, we are not always able to ensure that we have a secure connection.  By engaging in this virtual visit, you consent to the provision of healthcare and authorize for your insurance to be billed (if applicable) for the services provided during this visit. Depending on your insurance coverage, you may receive a charge related to this service.  I need to obtain your verbal consent now. Are you willing to proceed with your visit today? Malayasia Mirkin has provided verbal consent on 11/03/2024 for a virtual visit (video or telephone). Betzayda Braxton, PA-C  Date: 11/03/2024 11:14 AM   Virtual Visit via Video Note   I, Alissa Pharr, connected with  Kaleeya Hancock  ([id number], 28-Jul-1946) on 11/03/2024 at 10:30 AM EST by a video-enabled telemedicine application and verified that I am speaking with the correct person using two identifiers.  Location: Patient: Virtual Visit Location Patient:  Home Provider: Virtual Visit Location Provider: Home Office   I discussed the limitations of evaluation and management by telemedicine and the availability of in person appointments. The patient expressed understanding and agreed to proceed.    History of Present Illness: Fawna Cranmer is a 78 y.o. who identifies as a female who was assigned female at birth, and is being seen today for back spasm.  HPI: Chronic history of back spasms who presents today for for evaluation of ongoing back spasm symptoms for the last few days.  Patient was seen in the emergency room on December 21, reports she was treated with pain medications and prednisone .  At this time her pain is 6 out of 10 burning sensation in the right sided mid back.  Patient reports that the pain/symptoms are nonradiating she denies any numbness tingling of the extremities, loss of urinary or bowel control.  Reports in the past she had had relief with Botox  injections given by her neurologist Dr. Charlie Crete, they do not have any appointments available.  At this time she is taking Flexeril  as needed for pain, Percocet, also using TENS unit at home.  No recent trauma or injury.  Patient works in [employer], exposed to go back to work today however concern at this time with her symptoms worsening while at work.    Problems:  Patient Active Problem List   Diagnosis Date Noted   Vertigo 10/15/2023   Essential hypertension 10/15/2023   Cognitive complaints 07/01/2023   Cerebral atrophy 02/13/2023  Foot and ankle pain 09/26/2022   Hypothyroidism 08/24/2022   Cyst of buttocks 05/04/2022   DOE (dyspnea on exertion) 10/09/2021   Post-COVID syndrome 07/31/2021   Dysphagia 07/10/2021   Tubular adenoma of colon 07/19/2017   Mid back pain 02/08/2017   Hx of colonic polyps 12/03/2016   Family history of colon cancer 12/03/2016   Encounter for therapeutic drug monitoring 11/21/2016   CAD (coronary artery disease) 09/12/2016   PAF (paroxysmal  atrial fibrillation) (HCC) 09/12/2016   DJD (degenerative joint disease) of cervical spine 09/12/2016   Statin intolerance 09/12/2016   TIA (transient ischemic attack) 09/12/2016   Diverticulitis of colon 09/12/2016   Myofascial pain syndrome 09/06/2016   Takotsubo syndrome 09/06/2016   HLD (hyperlipidemia) 09/06/2016   GAD (generalized anxiety disorder) 09/06/2016   Recurrent UTI (urinary tract infection) 09/06/2016    Allergies: Allergies[1] Medications: Current Medications[2]  Observations/Objective: Patient is well-developed, well-nourished in no acute distress.  Resting comfortably  at home.  Ambulatory without any acute distress visible from video Head is normocephalic, atraumatic.  No labored breathing.  Speech is clear and coherent with logical content.  Patient is alert and oriented at baseline.  Appears to be uncomfortable sitting down  Assessment and Plan: 1. Muscle spasm of back (Primary) - orphenadrine  (NORFLEX ) 100 MG tablet; Take 1 tablet (100 mg total) by mouth 2 (two) times daily for 7 days.  Dispense: 14 tablet; Refill: 0  Patient reported no red flag signs of back pain such as loss of sensation, numbness and tingling of lower extremities, loss of urine or bowel control, abdominal pain. She is currently on treatment with Percocet, and gabapentin  which she has not taken.  Patient was advised to take gabapentin  as it may help with her current symptoms, we will switch to Norflex  per daytime she has not taken in the past with low side effect profile for drowsiness.  Patient was also advised to use topical pain patches such as Salonpas and or lidocaine  pain patch.  May also use a heating pad.  Patient was advised to do at home back of range of motion stretching.  Avoid any sudden position changes.  ER if any new or worsening symptoms.  Follow Up Instructions: I discussed the assessment and treatment plan with the patient. The patient was provided an opportunity to ask  questions and all were answered. The patient agreed with the plan and demonstrated an understanding of the instructions.  A copy of instructions were sent to the patient via MyChart unless otherwise noted below.    The patient was advised to call back or seek an in-person evaluation if the symptoms worsen or if the condition fails to improve as anticipated.    Alencia Gordon, PA-C     [1]  Allergies Allergen Reactions   Statins Other (See Comments)    Muscle weakness   Other     EKG pads causes blisters    Sulfa Antibiotics Rash  [2]  Current Outpatient Medications:    orphenadrine  (NORFLEX ) 100 MG tablet, Take 1 tablet (100 mg total) by mouth 2 (two) times daily for 7 days., Disp: 14 tablet, Rfl: 0   azithromycin  (ZITHROMAX ) 250 MG tablet, Take 1 tablet (250 mg total) by mouth daily. Take first 2 tablets together, then 1 every day until finished. (Patient not taking: Reported on 09/07/2024), Disp: 6 tablet, Rfl: 0   buPROPion  (WELLBUTRIN  SR) 150 MG 12 hr tablet, TAKE ONE TABLET BY MOUTH TWICE A DAY (Patient taking differently: Take 150 mg  by mouth 2 (two) times daily as needed.), Disp: 180 tablet, Rfl: 1   buPROPion  (WELLBUTRIN  SR) 150 MG 12 hr tablet, Take 1 tablet (150 mg total) by mouth 2 (two) times daily., Disp: 180 tablet, Rfl: 3   Cholecalciferol (VITAMIN D3) 125 MCG (5000 UT) TABS, Take 5,000 Units by mouth daily at 12 noon., Disp: , Rfl:    Coenzyme Q10 100 MG capsule, Take 100 mg by mouth daily at 12 noon., Disp: , Rfl:    cyclobenzaprine  (FLEXERIL ) 5 MG tablet, Take 1 tablet (5 mg total) by mouth daily as needed for muscle spasms., Disp: 30 tablet, Rfl: 5   diazepam  (VALIUM ) 5 MG tablet, Take 1 tablet (5 mg total) by mouth daily as needed for anxiety or muscle spasms., Disp: 30 tablet, Rfl: 3   docusate sodium (COLACE) 100 MG capsule, Take 100 mg by mouth 2 (two) times daily. , Disp: , Rfl:    doxycycline  (VIBRA -TABS) 100 MG tablet, Take 1 tablet (100 mg total) by mouth 2 (two)  times daily. (Patient not taking: Reported on 09/07/2024), Disp: 14 tablet, Rfl: 0   estradiol  (ESTRACE ) 1 MG tablet, Take 1 mg by mouth daily., Disp: , Rfl:    estradiol  (ESTRACE ) 1 MG tablet, Take 1 tablet (1 mg total) by mouth daily., Disp: 90 tablet, Rfl: 3   oxyCODONE -acetaminophen  (PERCOCET/ROXICET) 5-325 MG tablet, TAKE 1 TABLET DAILY AS NEEDED FOR SEVERE PAIN., Disp: 30 tablet, Rfl: 0   predniSONE  (DELTASONE ) 10 MG tablet, Take 6 tablets day one, 5 tablets day two, 4 tablets day three, 3 tablets day four, 2 tablets day five, then 1 tablet day six, Disp: 21 tablet, Rfl: 0   progesterone  (PROMETRIUM ) 200 MG capsule, Take 1 capsule (200 mg total) by mouth daily., Disp: 90 capsule, Rfl: 1   warfarin (COUMADIN ) 5 MG tablet, TAKE 1 TO 1 & 1/2 (ONE & ONE-HALF) TABLETS BY MOUTH ONCE DAILY AS DIRECTED BY COUMADIN  CLINIC, Disp: 135 tablet, Rfl: 1  "

## 2024-11-06 ENCOUNTER — Encounter: Payer: Self-pay | Admitting: Neurology

## 2024-11-06 ENCOUNTER — Ambulatory Visit: Payer: Self-pay | Admitting: *Deleted

## 2024-11-06 NOTE — Telephone Encounter (Signed)
 FYI Only or Action Required?: requesting MRI order  Patient was last seen in primary care on 11/03/2024 by Zohra, Mobeen, PA-C.  Called Nurse Triage reporting Back Pain.  Symptoms began several days ago.  Interventions attempted: Prescription medications   Symptoms are: gradually worsening.  Triage Disposition: Call Specialist When Office Open  Patient/caregiver understands and will follow disposition?: Yes  Patient is requesting MRI for evaluation of changes she feels in her back and neck. Patient states her pain is manageable using the medication she has- but is concerned about changes. Patient advised no open appointment- in office today- advised ED/UC for any changes - numbness, weakness, changes in bowel/bladder control- patient states she will contact neurology or Ortho for Monday appointment.  Copied from CRM [id number]. Topic: Clinical - Red Word Triage >> Nov 06, 2024 10:55 AM Shanda MATSU wrote: Red Word that prompted transfer to Nurse Triage: Patient is reporting back spasms that are now radiating to he neck, patient stated it started last Sunday, she went to ER and was prescribed morphine  but it is not working. Patient also stated she is taking gabapentin  and prednisone , patient stated that she would normally see her neurologist, but her neurologist is not avail at this time, patient is req that a referral be sent for her to have a MRI. Reason for Disposition  [1] MODERATE back pain (e.g., interferes with normal activities) AND [2] present > 3 days  Answer Assessment - Initial Assessment Questions 1. ONSET: When did the pain begin? (e.g., minutes, hours, days)     Monday/Tuesday- patient noticed change in back 2. LOCATION: Where does it hurt? (upper, mid or lower back)     Upper back- spine/eack/ head 3. SEVERITY: How bad is the pain?  (e.g., Scale 1-10; mild, moderate, or severe)     4-5/10 4. PATTERN: Is the pain constant? (e.g., yes, no; constant, intermittent)       Dulls- sharpens- but constant, burning pain 5. RADIATION: Does the pain shoot into your legs or somewhere else?     Does have prickling in R foot 6. CAUSE:  What do you think is causing the back pain?      Hx chronic pain -but feels she has had change  7. BACK OVERUSE:  Any recent lifting of heavy objects, strenuous work or exercise?     no 8. MEDICINES: What have you taken so far for the pain? (e.g., nothing, acetaminophen , NSAIDS)     Norflex , gabapentin , prednisone , 10 unit, heat wraps 9. NEUROLOGIC SYMPTOMS: Do you have any weakness, numbness, or problems with bowel/bladder control?     Yes- patient feels medication related 10. OTHER SYMPTOMS: Do you have any other symptoms? (e.g., fever, abdomen pain, burning with urination, blood in urine)       no  Protocols used: Back Pain-A-AH

## 2024-11-06 NOTE — Telephone Encounter (Signed)
 noted

## 2024-11-10 ENCOUNTER — Telehealth

## 2024-11-10 DIAGNOSIS — M62830 Muscle spasm of back: Secondary | ICD-10-CM

## 2024-11-10 MED ORDER — ORPHENADRINE CITRATE ER 100 MG PO TB12: 100.0000 mg | ORAL_TABLET | Freq: Two times a day (BID) | ORAL | 0 refills | Status: DC

## 2024-11-10 MED ORDER — ORPHENADRINE CITRATE ER 100 MG PO TB12: 100.0000 mg | ORAL_TABLET | Freq: Two times a day (BID) | ORAL | 0 refills | Status: AC

## 2024-11-10 MED ORDER — GABAPENTIN 300 MG PO CAPS: 300.0000 mg | ORAL_CAPSULE | Freq: Three times a day (TID) | ORAL | 0 refills | Status: AC

## 2024-11-10 MED ORDER — GABAPENTIN 300 MG PO CAPS: 300.0000 mg | ORAL_CAPSULE | Freq: Two times a day (BID) | ORAL | 0 refills | Status: DC

## 2024-11-10 NOTE — Progress Notes (Signed)
 " Heather [name]   Heather [name], you are scheduled for a Heather visit with a [facility] provider today. Just as with appointments in the office, your [name] must be obtained to participate. Your [name] will be active for this visit and any Heather visit you may have with one of our providers in the next 365 days. If you have a MyChart account, a copy of this [name] can be sent to you electronically.  As this is a Heather visit, video technology does not allow for your provider to perform a traditional examination. This may limit your provider's ability to fully assess your condition. If your provider identifies any concerns that need to be evaluated in person or the need to arrange testing (such as labs, EKG, etc.), we will make arrangements to do so. Although advances in technology are sophisticated, we cannot ensure that it will always work on either your end or our end. If the connection with a video visit is poor, the visit may have to be switched to a telephone visit. With either a video or telephone visit, we are not always able to ensure that we have a secure connection.  By engaging in this Heather visit, you [name] to the provision of healthcare and authorize for your insurance to be billed (if applicable) for the services provided during this visit. Depending on your insurance coverage, you may receive a charge related to this service.  I need to obtain your verbal [name] now. Are you willing to proceed with your visit today? Heather [name] has provided verbal [name] on 11/10/2024 for a Heather visit (video or telephone). Heather Lamp, FNP  Date: 11/10/2024 2:13 PM   Heather Visit via Video Note   I, Heather [name], connected with  Heather [name]  ([id number], 08-07-1946) on 11/10/2024 at  2:00 PM EST by a video-enabled telemedicine application and verified that I am speaking with the correct person using two identifiers.  Location: Patient: Heather Visit Location Patient:  Home Provider: Virtual Visit Location Provider: Home Office   I discussed the limitations of evaluation and management by telemedicine and the availability of in person appointments. The patient expressed understanding and agreed to proceed.    History of Present Illness: Heather [name] is a 78 y.o. who identifies as a female who was assigned female at birth, and is being seen today for back pain. Given Norflex  12/23 via telehealth. Called Heather [name] with no response. History of back problems. Pain is still rt mid back near spine into neck.   HPI: HPI  Problems:  Patient Active Problem List   Diagnosis Date Noted   Vertigo 10/15/2023   Essential hypertension 10/15/2023   Cognitive complaints 07/01/2023   Cerebral atrophy 02/13/2023   Foot and ankle pain 09/26/2022   Hypothyroidism 08/24/2022   Cyst of buttocks 05/04/2022   DOE (dyspnea on exertion) 10/09/2021   Post-COVID syndrome 07/31/2021   Dysphagia 07/10/2021   Tubular adenoma of colon 07/19/2017   Mid back pain 02/08/2017   Hx of colonic polyps 12/03/2016   Family history of colon cancer 12/03/2016   Encounter for therapeutic drug monitoring 11/21/2016   CAD (coronary artery disease) 09/12/2016   PAF (paroxysmal atrial fibrillation) (HCC) 09/12/2016   DJD (degenerative joint disease) of cervical spine 09/12/2016   Statin intolerance 09/12/2016   TIA (transient ischemic attack) 09/12/2016   Diverticulitis of colon 09/12/2016   Myofascial pain syndrome 09/06/2016   Takotsubo syndrome 09/06/2016   HLD (hyperlipidemia) 09/06/2016   GAD (generalized anxiety disorder)  09/06/2016   Recurrent UTI (urinary tract infection) 09/06/2016    Allergies: Allergies[1] Medications: Current Medications[2]  Observations/Objective: Patient is well-developed, well-nourished in no acute distress.  Resting comfortably  at home.  Head is normocephalic, atraumatic.  No labored breathing.  Speech is clear and coherent with logical content.   Patient is alert and oriented at baseline.    Assessment and Plan: 1. Muscle spasm of back - orphenadrine  (NORFLEX ) 100 MG tablet; Take 1 tablet (100 mg total) by mouth 2 (two) times daily for 7 days.  Dispense: 14 tablet; Refill: 0  Heat, meds, continue to try to reach Heather [name]. She will keep apptmt with ortho Friday.   Follow Up Instructions: I discussed the assessment and treatment plan with the patient. The patient was provided an opportunity to ask questions and all were answered. The patient agreed with the plan and demonstrated an understanding of the instructions.  A copy of instructions were sent to the patient via MyChart unless otherwise noted below.     The patient was advised to call back or seek an in-person evaluation if the symptoms worsen or if the condition fails to improve as anticipated.    Heather Fonner, FNP     [1]  Allergies Allergen Reactions   Statins Other (See Comments)    Muscle weakness   Other     EKG pads causes blisters    Sulfa Antibiotics Rash  [2]  Current Outpatient Medications:    gabapentin  (NEURONTIN ) 300 MG capsule, Take 1 capsule (300 mg total) by mouth 2 (two) times daily., Disp: 60 capsule, Rfl: 0   azithromycin  (ZITHROMAX ) 250 MG tablet, Take 1 tablet (250 mg total) by mouth daily. Take first 2 tablets together, then 1 every day until finished. (Patient not taking: Reported on 09/07/2024), Disp: 6 tablet, Rfl: 0   buPROPion  (WELLBUTRIN  SR) 150 MG 12 hr tablet, TAKE ONE TABLET BY MOUTH TWICE A DAY (Patient taking differently: Take 150 mg by mouth 2 (two) times daily as needed.), Disp: 180 tablet, Rfl: 1   buPROPion  (WELLBUTRIN  SR) 150 MG 12 hr tablet, Take 1 tablet (150 mg total) by mouth 2 (two) times daily., Disp: 180 tablet, Rfl: 3   Cholecalciferol (VITAMIN D3) 125 MCG (5000 UT) TABS, Take 5,000 Units by mouth daily at 12 noon., Disp: , Rfl:    Coenzyme Q10 100 MG capsule, Take 100 mg by mouth daily at 12 noon., Disp: , Rfl:     cyclobenzaprine  (FLEXERIL ) 5 MG tablet, Take 1 tablet (5 mg total) by mouth daily as needed for muscle spasms., Disp: 30 tablet, Rfl: 5   diazepam  (VALIUM ) 5 MG tablet, Take 1 tablet (5 mg total) by mouth daily as needed for anxiety or muscle spasms., Disp: 30 tablet, Rfl: 3   docusate sodium (COLACE) 100 MG capsule, Take 100 mg by mouth 2 (two) times daily. , Disp: , Rfl:    doxycycline  (VIBRA -TABS) 100 MG tablet, Take 1 tablet (100 mg total) by mouth 2 (two) times daily. (Patient not taking: Reported on 09/07/2024), Disp: 14 tablet, Rfl: 0   estradiol  (ESTRACE ) 1 MG tablet, Take 1 mg by mouth daily., Disp: , Rfl:    estradiol  (ESTRACE ) 1 MG tablet, Take 1 tablet (1 mg total) by mouth daily., Disp: 90 tablet, Rfl: 3   orphenadrine  (NORFLEX ) 100 MG tablet, Take 1 tablet (100 mg total) by mouth 2 (two) times daily for 7 days., Disp: 14 tablet, Rfl: 0   oxyCODONE -acetaminophen  (PERCOCET/ROXICET) 5-325 MG tablet, TAKE  1 TABLET DAILY AS NEEDED FOR SEVERE PAIN., Disp: 30 tablet, Rfl: 0   predniSONE  (DELTASONE ) 10 MG tablet, Take 6 tablets day one, 5 tablets day two, 4 tablets day three, 3 tablets day four, 2 tablets day five, then 1 tablet day six, Disp: 21 tablet, Rfl: 0   progesterone  (PROMETRIUM ) 200 MG capsule, Take 1 capsule (200 mg total) by mouth daily., Disp: 90 capsule, Rfl: 1   warfarin (COUMADIN ) 5 MG tablet, TAKE 1 TO 1 & 1/2 (ONE & ONE-HALF) TABLETS BY MOUTH ONCE DAILY AS DIRECTED BY COUMADIN  CLINIC, Disp: 135 tablet, Rfl: 1  "

## 2024-11-10 NOTE — Patient Instructions (Signed)
 Acute Back Pain, Adult Acute back pain is sudden and usually short-lived. It is often caused by an injury to the muscles and tissues in the back. The injury may result from: A muscle, tendon, or ligament getting overstretched or torn. Ligaments are tissues that connect bones to each other. Lifting something improperly can cause a back strain. Wear and tear (degeneration) of the spinal disks. Spinal disks are circular tissue that provide cushioning between the bones of the spine (vertebrae). Twisting motions, such as while playing sports or doing yard work. A hit to the back. Arthritis. You may have a physical exam, lab tests, and imaging tests to find the cause of your pain. Acute back pain usually goes away with rest and home care. Follow these instructions at home: Managing pain, stiffness, and swelling Take over-the-counter and prescription medicines only as told by your health care provider. Treatment may include medicines for pain and inflammation that are taken by mouth or applied to the skin, or muscle relaxants. Your health care provider may recommend applying ice during the first 24-48 hours after your pain starts. To do this: Put ice in a plastic bag. Place a towel between your skin and the bag. Leave the ice on for 20 minutes, 2-3 times a day. Remove the ice if your skin turns bright red. This is very important. If you cannot feel pain, heat, or cold, you have a greater risk of damage to the area. If directed, apply heat to the affected area as often as told by your health care provider. Use the heat source that your health care provider recommends, such as a moist heat pack or a heating pad. Place a towel between your skin and the heat source. Leave the heat on for 20-30 minutes. Remove the heat if your skin turns bright red. This is especially important if you are unable to feel pain, heat, or cold. You have a greater risk of getting burned. Activity  Do not stay in bed. Staying in  bed for more than 1-2 days can delay your recovery. Sit up and stand up straight. Avoid leaning forward when you sit or hunching over when you stand. If you work at a desk, sit close to it so you do not need to lean over. Keep your chin tucked in. Keep your neck drawn back, and keep your elbows bent at a 90-degree angle (right angle). Sit high and close to the steering wheel when you drive. Add lower back (lumbar) support to your car seat, if needed. Take short walks on even surfaces as soon as you are able. Try to increase the length of time you walk each day. Do not sit, drive, or stand in one place for more than 30 minutes at a time. Sitting or standing for long periods of time can put stress on your back. Do not drive or use heavy machinery while taking prescription pain medicine. Use proper lifting techniques. When you bend and lift, use positions that put less stress on your back: [place] your knees. Keep the load close to your body. Avoid twisting. Exercise regularly as told by your health care provider. Exercising helps your back heal faster and helps prevent back injuries by keeping muscles strong and flexible. Work with a physical therapist to make a safe exercise program, as recommended by your health care provider. Do any exercises as told by your physical therapist. Lifestyle Maintain a healthy weight. Extra weight puts stress on your back and makes it difficult to have good  posture. Avoid activities or situations that make you feel anxious or stressed. Stress and anxiety increase muscle tension and can make back pain worse. Learn ways to manage anxiety and stress, such as through exercise. General instructions Sleep on a firm mattress in a comfortable position. Try lying on your side with your knees slightly bent. If you lie on your back, put a pillow under your knees. Keep your head and neck in a straight line with your spine (neutral position) when using electronic equipment like  smartphones or pads. To do this: Raise your smartphone or pad to look at it instead of bending your head or neck to look down. Put the smartphone or pad at the level of your face while looking at the screen. Follow your treatment plan as told by your health care provider. This may include: Cognitive or behavioral therapy. Acupuncture or massage therapy. Meditation or yoga. Contact a health care provider if: You have pain that is not relieved with rest or medicine. You have increasing pain going down into your legs or buttocks. Your pain does not improve after 2 weeks. You have pain at night. You lose weight without trying. You have a fever or chills. You develop nausea or vomiting. You develop abdominal pain. Get help right away if: You develop new bowel or bladder control problems. You have unusual weakness or numbness in your arms or legs. You feel faint. These symptoms may represent a serious problem that is an emergency. Do not wait to see if the symptoms will go away. Get medical help right away. Call your local emergency services (911 in the [place] Do not drive yourself to the hospital. Summary Acute back pain is sudden and usually short-lived. Use proper lifting techniques. When you bend and lift, use positions that put less stress on your back. Take over-the-counter and prescription medicines only as told by your health care provider, and apply heat or ice as told. This information is not intended to replace advice given to you by your health care provider. Make sure you discuss any questions you have with your health care provider. Document Revised: 01/20/2021 Document Reviewed: 01/20/2021 Elsevier Patient Education  2024 [employer].

## 2024-11-10 NOTE — Telephone Encounter (Signed)
 Pt called wanting to know if she can have a call back and not just speak through mychart. Pt is having muscle spasms that are getting worse and is wanting to know if she can get an appt for Botox  or for some other treatment. Please advise.

## 2024-11-13 ENCOUNTER — Other Ambulatory Visit: Payer: Self-pay

## 2024-11-13 ENCOUNTER — Encounter: Payer: Self-pay | Admitting: Neurology

## 2024-11-13 DIAGNOSIS — M542 Cervicalgia: Secondary | ICD-10-CM

## 2024-11-16 MED ORDER — OXYCODONE-ACETAMINOPHEN 5-325 MG PO TABS: ORAL_TABLET | ORAL | 0 refills | Status: AC

## 2024-11-16 NOTE — Telephone Encounter (Signed)
 Can I use Wed 1/7 @ 2:00pm for this pt?

## 2024-11-16 NOTE — Telephone Encounter (Signed)
 Requested Prescriptions   Pending Prescriptions Disp Refills   oxyCODONE -acetaminophen  (PERCOCET/ROXICET) 5-325 MG tablet 30 tablet 0    Sig: TAKE 1 TABLET DAILY AS NEEDED FOR SEVERE PAIN.   Last seen 07/06/24 Next appt not scheduled   Pt was in er I could offer 11/24/23 as a botox  shot appt if you are agreeable?  Dispenses   Dispensed Days Supply Quantity Provider Pharmacy  OXYCODONE /ACETAMINOPHEN  5-325MG  TAB 07/28/2024 30 30 each Sater, Charlie LABOR, MD [facility]  OXYCODONE /ACETAMINOPHEN  5-325MG  TAB 06/22/2024 30 30 each Sater, Charlie LABOR, MD [facility]  OXYCODONE /ACETAMINOPHEN  5-325MG  TAB 04/17/2024 30 30 each Sater, Charlie LABOR, MD [facility]  OXYCODONE /ACETAMINOPHEN  5-325MG  TAB 02/25/2024 30 30 each Sater, Charlie LABOR, MD [facility]  OXYCODONE /ACETAMINOPHEN  5-325MG  TAB 01/01/2024 30 30 each Sater, Charlie LABOR, MD [facility]  OXYCODONE /ACETAMINOPHEN  5-325MG  TAB 11/18/2023 30 30 each Sater, Charlie LABOR, MD [email or web address]

## 2024-11-17 ENCOUNTER — Telehealth: Payer: Self-pay

## 2024-11-17 NOTE — Telephone Encounter (Signed)
 Tried to offer appointment, pt refused due to work and wanting guaranteed injection in her back

## 2024-11-17 NOTE — Telephone Encounter (Signed)
 Called pt to offer appointment with Dr. Vear on 11/18/24 @2pm  and pt didn't want appointment unless she was guaranteed to get botox  injection in her back. Pt states she is doing much better and to let Dr. Vear kem fry thank him for the appointment.

## 2024-11-17 NOTE — Telephone Encounter (Signed)
 I spoke to pt who stated that the pain has subsided and she only wants to come for botox  shot. And that she is having mris completed

## 2024-11-17 NOTE — Telephone Encounter (Signed)
 Her insurance does not require PA, but she has Medicare and cannot be SP. Dr. Vear, is she ok to keep on as B/B?

## 2024-11-18 NOTE — Telephone Encounter (Signed)
 Called and spoke to pt who stated that she was getting botox  for free thru medicare and she is agreeable to switch to xeomin as long as its free of charge to her just like the botox . I scheduled for 11/23/24. Is this enoughh time to get approved or just do botox  this time then switch?

## 2024-11-19 NOTE — Telephone Encounter (Signed)
 No PA is needed through Medicare for Botox  or Xeomin .

## 2024-11-23 ENCOUNTER — Encounter: Payer: Self-pay | Admitting: Neurology

## 2024-11-23 ENCOUNTER — Ambulatory Visit: Admitting: Neurology

## 2024-11-23 DIAGNOSIS — M62838 Other muscle spasm: Secondary | ICD-10-CM

## 2024-11-23 DIAGNOSIS — M7918 Myalgia, other site: Secondary | ICD-10-CM

## 2024-11-23 MED ORDER — INCOBOTULINUMTOXINA 100 UNITS IM SOLR
100.0000 [IU] | INTRAMUSCULAR | Status: AC
  Administered 2024-11-23: 100 [IU] via INTRAMUSCULAR

## 2024-11-23 NOTE — Progress Notes (Signed)
 "  [facility]  PATIENT: Heather [name] DOB: 1945-12-02  REFERRING DOCTOR OR PCP:  Jamee Moose SOURCE: patient  _________________________________   HISTORICAL  CHIEF COMPLAINT:  Chief Complaint  Patient presents with   RM10/XEOMIN     Pt is here with her Husband.     HISTORY OF PRESENT ILLNESS:  Heather [name] is a 79 y.o. woman who has had chronic right-sided mid back pain since 2013.      Update 11/23/2024 She reports that her myofascial pain continues to do well with the Botox  therapy.  Pain is located in the right trunk, predominantly in the intercoastal muscles from around T7-T11  .  The Botox  injection has markedly reduced the thoracic pain and has made her more functional, allowing her to work.   Pain started back a couple weeks ago when she was active and today while resting.  .  When she has more of the myofascial pain pain, she will take half of a Percocet.  If she notes more spasticity she will take half of Valium .  Prescriptions have lasted multiple months.   THe Botox  has greatly reduced tha pain for 2.5 to 3 months  Cognition is stable. She is not noting any more speech issues.  MRI of the brain showed mild atrophy.   There is no lobar predominant pattern.    She sleeps well   She never combines with a percocet.    She is on coumadin  for atrial fibrillation.  If she notes more palpitations, she takes a valium  (rare).      IMAGING:  MRI results from 10/14/2013: The MRI of the brain showed age related atrophy and minimal chronic microvascular ischemic change. MRI of the cervical spine showed multilevel mild degenerative changes with left paramedian disc herniation at C3-C4 and left disc protrusion at C4-C5 and C5-C6 and midline disc herniation at C6-C7. There was no report of nerve root compression. MRI of the thoracic spine showed disc desiccation but no herniation or protrusions. MRI of the lumbar spine showed disc bulges at T12-L1 and L2-L3 and disc  bulge with facet hypertrophy at L3-L4 and disc bulge with right foraminal annular tear at L4-L5.  MRI brain 7/4/2-22 was personally reviewed.  It shows mild generalized atrophy.  No acute findings.     REVIEW OF SYSTEMS: Constitutional: No fevers, chills, sweats, or change in appetite Eyes: No visual changes, double vision, eye pain Ear, nose and throat: No hearing loss, ear pain, nasal congestion, sore throat Cardiovascular: No chest pain, palpitations.   She has atrial fibrillation and is on warfarin Respiratory:  No shortness of breath at rest or with exertion.   No wheezes GastrointestinaI: No nausea, vomiting, diarrhea, abdominal pain, fecal incontinence Genitourinary:  No dysuria, urinary retention or frequency.  No nocturia. Musculoskeletal:  No neck pain.  She has back pain/thoracic pain as above Integumentary: No rash, pruritus, skin lesions Neurological: as above Psychiatric: No depression at this time.  No anxiety Endocrine: No palpitations, diaphoresis, change in appetite, change in weigh or increased thirst Hematologic/Lymphatic:  No anemia, purpura, petechiae. Allergic/Immunologic: No itchy/runny eyes, nasal congestion, recent allergic reactions, rashes  ALLERGIES: Allergies  Allergen Reactions   Statins Other (See Comments)    Muscle weakness   Other     EKG pads causes blisters    Sulfa Antibiotics Rash    HOME MEDICATIONS:  Current Outpatient Medications:    Cholecalciferol (VITAMIN D3) 125 MCG (5000 UT) TABS, Take 5,000 Units by mouth daily at 12 noon.,  Disp: , Rfl:    Coenzyme Q10 100 MG capsule, Take 100 mg by mouth daily at 12 noon., Disp: , Rfl:    cyclobenzaprine  (FLEXERIL ) 5 MG tablet, Take 1 tablet (5 mg total) by mouth daily as needed for muscle spasms., Disp: 30 tablet, Rfl: 5   diazepam  (VALIUM ) 5 MG tablet, Take 1 tablet (5 mg total) by mouth daily as needed for anxiety or muscle spasms., Disp: 30 tablet, Rfl: 3   docusate sodium (COLACE) 100 MG  capsule, Take 100 mg by mouth 2 (two) times daily. , Disp: , Rfl:    estradiol  (ESTRACE ) 1 MG tablet, Take 1 tablet (1 mg total) by mouth daily., Disp: 90 tablet, Rfl: 3   gabapentin  (NEURONTIN ) 300 MG capsule, Take 1 capsule (300 mg total) by mouth 3 (three) times daily., Disp: 90 capsule, Rfl: 0   LORazepam  (ATIVAN ) 1 MG tablet, Take by mouth., Disp: , Rfl:    orphenadrine  (NORFLEX ) 100 MG tablet, Take 1 tablet (100 mg total) by mouth 2 (two) times daily., Disp: 60 tablet, Rfl: 0   oxyCODONE -acetaminophen  (PERCOCET/ROXICET) 5-325 MG tablet, TAKE 1 TABLET DAILY AS NEEDED FOR SEVERE PAIN., Disp: 30 tablet, Rfl: 0   predniSONE  (DELTASONE ) 10 MG tablet, Take 6 tablets day one, 5 tablets day two, 4 tablets day three, 3 tablets day four, 2 tablets day five, then 1 tablet day six, Disp: 21 tablet, Rfl: 0   progesterone  (PROMETRIUM ) 200 MG capsule, Take 1 capsule (200 mg total) by mouth daily., Disp: 90 capsule, Rfl: 1   warfarin (COUMADIN ) 5 MG tablet, TAKE 1 TO 1 & 1/2 (ONE & ONE-HALF) TABLETS BY MOUTH ONCE DAILY AS DIRECTED BY COUMADIN  CLINIC, Disp: 135 tablet, Rfl: 1   azithromycin  (ZITHROMAX ) 250 MG tablet, Take 1 tablet (250 mg total) by mouth daily. Take first 2 tablets together, then 1 every day until finished. (Patient not taking: Reported on 11/23/2024), Disp: 6 tablet, Rfl: 0   buPROPion  (WELLBUTRIN  SR) 150 MG 12 hr tablet, TAKE ONE TABLET BY MOUTH TWICE A DAY (Patient taking differently: Take 150 mg by mouth 2 (two) times daily as needed.), Disp: 180 tablet, Rfl: 1   buPROPion  (WELLBUTRIN  SR) 150 MG 12 hr tablet, Take 1 tablet (150 mg total) by mouth 2 (two) times daily., Disp: 180 tablet, Rfl: 3   doxycycline  (VIBRA -TABS) 100 MG tablet, Take 1 tablet (100 mg total) by mouth 2 (two) times daily. (Patient not taking: Reported on 11/23/2024), Disp: 14 tablet, Rfl: 0   estradiol  (ESTRACE ) 1 MG tablet, Take 1 mg by mouth daily., Disp: , Rfl:   PAST MEDICAL HISTORY: Past Medical History:  Diagnosis  Date   Adrenal adenoma    Allergy    Anemia    Anxiety    CAD (coronary artery disease)    a. 50-60% mid LAD at cardiac catheterization 2014 ([place] ) b. non-obstructive CAD by cath in 09/2018   Cataract    Depression    DJD (degenerative joint disease) of cervical spine 09/12/2016   Dysrhythmia    Essential hypertension 10/15/2023   H. pylori infection 07/19/2017   History of cardiomyopathy    History of COVID-19 12/10/2019   History of tobacco use    Hyperlipidemia    Hypothyroidism 08/24/2022   Mid back pain 02/08/2017   Neuromuscular disorder (HCC)    PAF (paroxysmal atrial fibrillation) (HCC)    Diagnosis July 2017 - spontaneously resolved   Smell disturbance 06/13/2021   Statin intolerance     PAST SURGICAL  HISTORY: Past Surgical History:  Procedure Laterality Date   ABDOMINAL HYSTERECTOMY  1980   endometriosis   BIOPSY  07/18/2021   Procedure: BIOPSY;  Surgeon: Eartha Angelia Sieving, MD;  [facility]: [facility];  Service: Gastroenterology;;   BREAST BIOPSY Right 07/29/2023   MM RT BREAST BX W LOC DEV 1ST LESION IMAGE BX SPEC STEREO GUIDE 07/29/2023 [facility]   CATARACT EXTRACTION Left 11/2018   COLONOSCOPY WITH PROPOFOL  N/A 06/27/2022   Procedure: COLONOSCOPY WITH PROPOFOL ;  Surgeon: Eartha Angelia Sieving, MD;  [facility]: [facility];  Service: Gastroenterology;  Laterality: N/A;  11:15   CORONARY PRESSURE/FFR STUDY N/A 06/01/2021   Procedure: INTRAVASCULAR PRESSURE WIRE/FFR STUDY;  Surgeon: Claudene Victory ORN, MD;  [facility];  Service: Cardiovascular;  Laterality: N/A;   ESOPHAGOGASTRODUODENOSCOPY (EGD) WITH PROPOFOL  N/A 07/18/2021   Procedure: ESOPHAGOGASTRODUODENOSCOPY (EGD) WITH PROPOFOL ;  Surgeon: Eartha Angelia Sieving, MD;  [facility];  Service: Gastroenterology;  Laterality: N/A;  12:00   EYE SURGERY  cataracts  2021/2022   HEMOSTASIS CLIP PLACEMENT  06/27/2022   Procedure: HEMOSTASIS CLIP PLACEMENT;   Surgeon: Eartha Angelia Sieving, MD;  [facility];  Service: Gastroenterology;;   LEFT HEART CATH AND CORONARY ANGIOGRAPHY N/A 09/25/2018   Procedure: LEFT HEART CATH AND CORONARY ANGIOGRAPHY;  Surgeon: Claudene Victory ORN, MD;  [facility];  Service: Cardiovascular;  Laterality: N/A;   LEFT HEART CATH AND CORONARY ANGIOGRAPHY N/A 06/01/2021   Procedure: LEFT HEART CATH AND CORONARY ANGIOGRAPHY;  Surgeon: Claudene Victory ORN, MD;  [facility];  Service: Cardiovascular;  Laterality: N/A;   POLYPECTOMY  06/27/2022   Procedure: POLYPECTOMY;  Surgeon: Eartha Angelia Sieving, MD;  [facility];  Service: Gastroenterology;;   HARLEY DILATION  07/18/2021   Procedure: HARLEY DILATION;  Surgeon: Eartha Angelia, Sieving, MD;  [facility];  Service: Gastroenterology;;   SUBMUCOSAL LIFTING INJECTION  06/27/2022   Procedure: SUBMUCOSAL LIFTING INJECTION;  Surgeon: Eartha Angelia, Sieving, MD;  [facility];  Service: Gastroenterology;;   SUBMUCOSAL TATTOO INJECTION  06/27/2022   Procedure: SUBMUCOSAL TATTOO INJECTION;  Surgeon: Eartha Angelia, Sieving, MD;  [facility];  Service: Gastroenterology;;   TONSILLECTOMY  1954    FAMILY HISTORY: Family History  Problem Relation Age of Onset   COPD Mother    Hearing loss Mother    Stroke Mother    Heart disease Mother        chf, died at 58   Vision loss Mother    Varicose Veins Mother    Arthritis Father    Hypertension Father    Heart disease Father        cad, pvd, died at 43   Vision loss Father    Heart disease Sister        heart valve   Arthritis Sister    Depression Sister    Hyperlipidemia Brother    Hypertension Brother    Cancer - Prostate Brother     SOCIAL HISTORY:  Social History   Socioeconomic History   Marital status: Married    Spouse name: Al   Number of children: 0   Years of education: 18   Highest education level: Master's  degree (e.g., MA, MS, MEng, MEd, MSW, MBA)  Occupational History   Occupation: clinical research associate    Comment: from home  Tobacco Use   Smoking status: Former    Current packs/day: 0.00    Average packs/day: 1  pack/day for 42.8 years (42.8 ttl pk-yrs)    Types: Cigarettes    Start date: 11/12/1965    Quit date: 09/06/2008    Years since quitting: 16.2   Smokeless tobacco: Never  Vaping Use   Vaping status: Never Used  Substance and Sexual Activity   Alcohol use: Yes    Alcohol/week: 2.0 standard drinks of alcohol    Types: 1 Glasses of wine, 1 Shots of liquor per week    Comment: occasional   Drug use: Yes    Types: Hydrocodone, Marijuana    Comment: prescribed Percocet for myofacial pain syndrome   Sexual activity: Yes    Birth control/protection: Post-menopausal  Other Topics Concern   Not on file  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   Lives with husband AL   No children   Healthy diet and lifestyle   Social Drivers of Health   Tobacco Use: Medium Risk (11/10/2024)   Patient History    Smoking Tobacco Use: Former    Smokeless Tobacco Use: Never    Passive Exposure: Not on Actuary Strain: Low Risk (10/11/2023)   Overall Financial Resource Strain (CARDIA)    Difficulty of Paying Living Expenses: Not very hard  Food Insecurity: No Food Insecurity (10/11/2023)   Hunger Vital Sign    Worried About Running Out of Food in the Last Year: Never true    Ran Out of Food in the Last Year: Never true  Transportation Needs: No Transportation Needs (10/11/2023)   PRAPARE - Administrator, Civil Service (Medical): No    Lack of Transportation (Non-Medical): No  Physical Activity: Sufficiently Active (10/11/2023)   Exercise Vital Sign    Days of Exercise per Week: 3 days    Minutes of Exercise per Session: 120 min  Stress: No Stress Concern Present (10/11/2023)   [employer] of Occupational Health - Occupational Stress Questionnaire    Feeling of  Stress : Not at all  Social Connections: Moderately Isolated (10/11/2023)   Social Connection and Isolation Panel    Frequency of Communication with Friends and Family: Twice a week    Frequency of Social Gatherings with Friends and Family: Once a week    Attends Religious Services: Never    Database Administrator or Organizations: No    Attends Engineer, Structural: Not on file    Marital Status: Married  Catering Manager Violence: Not on file  Depression (PHQ2-9): Low Risk (10/15/2023)   Depression (PHQ2-9)    PHQ-2 Score: 0  Alcohol Screen: Low Risk (10/11/2023)   Alcohol Screen    Last Alcohol Screening Score (AUDIT): 1  Housing: Low Risk (10/11/2023)   Housing    Last Housing Risk Score: 0  Utilities: Not At Risk (09/13/2022)   AHC Utilities    Threatened with loss of utilities: No  Health Literacy: Not on file     PHYSICAL EXAM   General: The patient is well-developed and well-nourished and in no acute distress.  Head is normocephalic and atraumatic.   Musculoskeletal : There is tenderness in the mid to lower thoracic intercoastal muscles from T6-T9 on the right .  The neck was nontender today.  Neurologic Exam  Mental status: The patient is alert and oriented x 3 at the time of the examination.   Focus and attention seem normal.  Speech appeared normal.  No aphasia was noted during the conversation and she had no problems, with the right words Cranial  nerves: Extraocular muscles are intact.  Facial strength is normal.   . No obvious hearing deficits are noted.  Motor: She has normal muscle tone, muscle bulk and muscle strength in the arms or legs.  gait and station: Gait and station are normal.  The Romberg is negative    ASSESSMENT AND PLAN  1. Myofascial pain syndrome   2. Spasm of skeletal muscle of thorax   3. Muscle spasm       1.  Inject 100 units of Xeomin  using 30-gauge needle split into tender points below the T6-T9 ribs into the intercostal  muscles near the midclavicular line.  There were no complications and she tolerated the injections well.   2.    Stay active and exercise as tolerated.  When pain is more intense, she can take a percocet - use sparingly 3.  She will return in 3-4 months for next Botox  or sooner if  new or worsening neurologic symptoms.  Ireland Chagnon A. Vear, MD, PhD 11/23/2024, 3:28 PM Certified in Neurology, Clinical Neurophysiology, Sleep Medicine, Pain Medicine and Neuroimaging  [facility] [address], [address] [place], [place] [address] [phone] "

## 2024-11-23 NOTE — Progress Notes (Signed)
 xeomin  100units x 1 vial  [id number] [id number] Exp-10/2026  Bacteriostatic 0.9% Sodium Chloride - 4mL  Onu:[id number] Expiration: 02/09/2026 NDC: 9590-8033-97 Dx: M79.18 B/B

## 2024-12-07 ENCOUNTER — Other Ambulatory Visit

## 2024-12-14 ENCOUNTER — Ambulatory Visit

## 2024-12-28 ENCOUNTER — Other Ambulatory Visit

## 2025-01-11 ENCOUNTER — Other Ambulatory Visit

## 2025-02-15 ENCOUNTER — Ambulatory Visit: Admitting: Neurology
